# Patient Record
Sex: Male | Born: 1955 | Race: Black or African American | Hispanic: No | Smoking: Former smoker
Health system: Southern US, Community
[De-identification: ages and names within clinical notes are randomized; demographics above are authoritative.]

## PROBLEM LIST (undated history)

## (undated) DIAGNOSIS — K603 Anal fistula, unspecified: Secondary | ICD-10-CM

## (undated) DIAGNOSIS — E785 Hyperlipidemia, unspecified: Secondary | ICD-10-CM

## (undated) DIAGNOSIS — Z973 Presence of spectacles and contact lenses: Secondary | ICD-10-CM

## (undated) DIAGNOSIS — M1990 Unspecified osteoarthritis, unspecified site: Secondary | ICD-10-CM

## (undated) DIAGNOSIS — Z972 Presence of dental prosthetic device (complete) (partial): Secondary | ICD-10-CM

## (undated) DIAGNOSIS — I10 Essential (primary) hypertension: Secondary | ICD-10-CM

## (undated) DIAGNOSIS — M79672 Pain in left foot: Secondary | ICD-10-CM

## (undated) DIAGNOSIS — M79671 Pain in right foot: Secondary | ICD-10-CM

## (undated) DIAGNOSIS — C61 Malignant neoplasm of prostate: Secondary | ICD-10-CM

## (undated) DIAGNOSIS — E119 Type 2 diabetes mellitus without complications: Secondary | ICD-10-CM

## (undated) DIAGNOSIS — K219 Gastro-esophageal reflux disease without esophagitis: Secondary | ICD-10-CM

## (undated) DIAGNOSIS — E1140 Type 2 diabetes mellitus with diabetic neuropathy, unspecified: Secondary | ICD-10-CM

## (undated) DIAGNOSIS — M2550 Pain in unspecified joint: Secondary | ICD-10-CM

## (undated) DIAGNOSIS — T7840XA Allergy, unspecified, initial encounter: Secondary | ICD-10-CM

## (undated) HISTORY — DX: Presence of spectacles and contact lenses: Z97.3

## (undated) HISTORY — DX: Malignant neoplasm of prostate: C61

## (undated) HISTORY — DX: Pain in right foot: M79.671

## (undated) HISTORY — PX: COLONOSCOPY: SHX174

## (undated) HISTORY — DX: Hyperlipidemia, unspecified: E78.5

## (undated) HISTORY — PX: NO PAST SURGERIES: SHX2092

## (undated) HISTORY — DX: Type 2 diabetes mellitus without complications: E11.9

## (undated) HISTORY — DX: Type 2 diabetes mellitus with diabetic neuropathy, unspecified: E11.40

## (undated) HISTORY — DX: Gastro-esophageal reflux disease without esophagitis: K21.9

## (undated) HISTORY — DX: Pain in unspecified joint: M25.50

## (undated) HISTORY — DX: Pain in right foot: M79.672

## (undated) HISTORY — DX: Allergy, unspecified, initial encounter: T78.40XA

---

## 2000-02-22 ENCOUNTER — Encounter: Admission: RE | Admit: 2000-02-22 | Discharge: 2000-05-22 | Payer: Self-pay | Admitting: Family Medicine

## 2002-01-21 ENCOUNTER — Emergency Department (HOSPITAL_COMMUNITY): Admission: EM | Admit: 2002-01-21 | Discharge: 2002-01-21 | Payer: Self-pay | Admitting: Emergency Medicine

## 2002-05-07 ENCOUNTER — Encounter: Admission: RE | Admit: 2002-05-07 | Discharge: 2002-05-07 | Payer: Self-pay | Admitting: Internal Medicine

## 2002-05-14 ENCOUNTER — Encounter: Admission: RE | Admit: 2002-05-14 | Discharge: 2002-05-14 | Payer: Self-pay | Admitting: Internal Medicine

## 2004-03-21 ENCOUNTER — Emergency Department (HOSPITAL_COMMUNITY): Admission: EM | Admit: 2004-03-21 | Discharge: 2004-03-21 | Payer: Self-pay | Admitting: Emergency Medicine

## 2004-08-20 ENCOUNTER — Ambulatory Visit: Payer: Self-pay | Admitting: Internal Medicine

## 2005-09-16 ENCOUNTER — Emergency Department (HOSPITAL_COMMUNITY): Admission: EM | Admit: 2005-09-16 | Discharge: 2005-09-16 | Payer: Self-pay | Admitting: Emergency Medicine

## 2006-04-05 ENCOUNTER — Ambulatory Visit: Payer: Self-pay | Admitting: Family Medicine

## 2006-04-06 ENCOUNTER — Ambulatory Visit: Payer: Self-pay | Admitting: *Deleted

## 2006-04-09 ENCOUNTER — Emergency Department (HOSPITAL_COMMUNITY): Admission: EM | Admit: 2006-04-09 | Discharge: 2006-04-09 | Payer: Self-pay | Admitting: Emergency Medicine

## 2006-05-03 ENCOUNTER — Ambulatory Visit: Payer: Self-pay | Admitting: Family Medicine

## 2006-05-24 ENCOUNTER — Ambulatory Visit: Payer: Self-pay | Admitting: Family Medicine

## 2006-07-28 ENCOUNTER — Ambulatory Visit: Payer: Self-pay | Admitting: Family Medicine

## 2006-08-24 ENCOUNTER — Ambulatory Visit: Payer: Self-pay | Admitting: Family Medicine

## 2006-11-16 ENCOUNTER — Ambulatory Visit: Payer: Self-pay | Admitting: Family Medicine

## 2006-11-22 ENCOUNTER — Ambulatory Visit: Payer: Self-pay | Admitting: Family Medicine

## 2007-03-22 ENCOUNTER — Encounter (INDEPENDENT_AMBULATORY_CARE_PROVIDER_SITE_OTHER): Payer: Self-pay | Admitting: *Deleted

## 2007-05-05 ENCOUNTER — Ambulatory Visit: Payer: Self-pay | Admitting: Family Medicine

## 2007-05-17 ENCOUNTER — Ambulatory Visit (HOSPITAL_BASED_OUTPATIENT_CLINIC_OR_DEPARTMENT_OTHER): Admission: RE | Admit: 2007-05-17 | Discharge: 2007-05-17 | Payer: Self-pay | Admitting: Family Medicine

## 2007-05-21 ENCOUNTER — Ambulatory Visit: Payer: Self-pay | Admitting: Internal Medicine

## 2007-05-25 ENCOUNTER — Emergency Department (HOSPITAL_COMMUNITY): Admission: EM | Admit: 2007-05-25 | Discharge: 2007-05-25 | Payer: Self-pay | Admitting: Emergency Medicine

## 2007-10-16 ENCOUNTER — Ambulatory Visit: Payer: Self-pay | Admitting: Family Medicine

## 2007-10-23 ENCOUNTER — Ambulatory Visit: Payer: Self-pay | Admitting: Internal Medicine

## 2007-11-28 ENCOUNTER — Ambulatory Visit: Payer: Self-pay | Admitting: Family Medicine

## 2008-02-29 ENCOUNTER — Ambulatory Visit: Payer: Self-pay | Admitting: Internal Medicine

## 2008-03-09 ENCOUNTER — Emergency Department (HOSPITAL_BASED_OUTPATIENT_CLINIC_OR_DEPARTMENT_OTHER): Admission: EM | Admit: 2008-03-09 | Discharge: 2008-03-09 | Payer: Self-pay | Admitting: Emergency Medicine

## 2008-03-14 ENCOUNTER — Emergency Department (HOSPITAL_BASED_OUTPATIENT_CLINIC_OR_DEPARTMENT_OTHER): Admission: EM | Admit: 2008-03-14 | Discharge: 2008-03-14 | Payer: Self-pay | Admitting: Emergency Medicine

## 2008-03-19 ENCOUNTER — Emergency Department (HOSPITAL_BASED_OUTPATIENT_CLINIC_OR_DEPARTMENT_OTHER): Admission: EM | Admit: 2008-03-19 | Discharge: 2008-03-19 | Payer: Self-pay | Admitting: Emergency Medicine

## 2008-04-25 ENCOUNTER — Ambulatory Visit: Payer: Self-pay | Admitting: Family Medicine

## 2008-10-03 ENCOUNTER — Ambulatory Visit: Payer: Self-pay | Admitting: Family Medicine

## 2008-10-07 ENCOUNTER — Ambulatory Visit: Payer: Self-pay | Admitting: Family Medicine

## 2008-11-28 ENCOUNTER — Ambulatory Visit: Payer: Self-pay | Admitting: Family Medicine

## 2008-11-30 ENCOUNTER — Emergency Department (HOSPITAL_COMMUNITY): Admission: EM | Admit: 2008-11-30 | Discharge: 2008-11-30 | Payer: Self-pay | Admitting: Emergency Medicine

## 2009-02-04 ENCOUNTER — Ambulatory Visit: Payer: Self-pay | Admitting: Family Medicine

## 2009-02-28 ENCOUNTER — Ambulatory Visit: Payer: Self-pay | Admitting: Family Medicine

## 2009-04-02 ENCOUNTER — Ambulatory Visit: Payer: Self-pay | Admitting: Family Medicine

## 2009-04-28 ENCOUNTER — Ambulatory Visit: Payer: Self-pay | Admitting: Family Medicine

## 2009-06-03 ENCOUNTER — Ambulatory Visit: Payer: Self-pay | Admitting: Family Medicine

## 2009-07-13 ENCOUNTER — Emergency Department (HOSPITAL_COMMUNITY): Admission: EM | Admit: 2009-07-13 | Discharge: 2009-07-13 | Payer: Self-pay | Admitting: Family Medicine

## 2009-07-25 ENCOUNTER — Ambulatory Visit: Payer: Self-pay | Admitting: Internal Medicine

## 2009-08-07 ENCOUNTER — Ambulatory Visit: Payer: Self-pay | Admitting: Family Medicine

## 2009-08-18 ENCOUNTER — Ambulatory Visit: Payer: Self-pay | Admitting: Family Medicine

## 2009-08-18 LAB — CONVERTED CEMR LAB
HDL: 52 mg/dL (ref >39)
LDL Cholesterol: 102 mg/dL — ABNORMAL HIGH (ref 0–99)
Testosterone: 154.76 ng/dL — ABNORMAL LOW (ref 350–890)
Triglycerides: 179 mg/dL — ABNORMAL HIGH (ref <150)

## 2009-08-27 ENCOUNTER — Emergency Department (HOSPITAL_COMMUNITY): Admission: EM | Admit: 2009-08-27 | Discharge: 2009-08-27 | Payer: Self-pay | Admitting: Family Medicine

## 2009-09-11 ENCOUNTER — Ambulatory Visit: Payer: Self-pay | Admitting: Family Medicine

## 2009-09-23 ENCOUNTER — Telehealth (INDEPENDENT_AMBULATORY_CARE_PROVIDER_SITE_OTHER): Payer: Self-pay | Admitting: *Deleted

## 2009-09-24 ENCOUNTER — Ambulatory Visit: Payer: Self-pay | Admitting: Family Medicine

## 2009-09-29 ENCOUNTER — Ambulatory Visit (HOSPITAL_COMMUNITY): Admission: RE | Admit: 2009-09-29 | Discharge: 2009-09-29 | Payer: Self-pay | Admitting: Family Medicine

## 2009-10-22 ENCOUNTER — Ambulatory Visit: Payer: Self-pay | Admitting: Internal Medicine

## 2009-11-06 ENCOUNTER — Ambulatory Visit: Payer: Self-pay | Admitting: Internal Medicine

## 2009-11-19 ENCOUNTER — Ambulatory Visit (HOSPITAL_COMMUNITY): Admission: RE | Admit: 2009-11-19 | Discharge: 2009-11-19 | Payer: Self-pay | Admitting: Family Medicine

## 2009-12-04 ENCOUNTER — Ambulatory Visit: Payer: Self-pay | Admitting: Family Medicine

## 2010-01-22 ENCOUNTER — Ambulatory Visit: Payer: Self-pay | Admitting: Family Medicine

## 2010-05-19 ENCOUNTER — Ambulatory Visit: Payer: Self-pay | Admitting: Internal Medicine

## 2010-08-04 NOTE — Progress Notes (Signed)
Summary: triage/coughing  Phone Note Call from Patient   Caller: Patient Reason for Call: Talk to Nurse Summary of Call: patient has been having on-going issues with coughing and sinus congestion.Marland KitchenMarland KitchenHe was seen at urgent care and treated with amoxicillin and ipratropium bromide on 08/27/09.Marland KitchenMarland Kitchen3/10 was seen here and given another round of amoxicillin by Dr. Audria Nine...he states he is still coughing and still has the congestion. his ears are now ringing. He has been using allergia. Appointment made in Acute Schedule. Initial call taken by: Conchita Paris,  September 23, 2009 12:51 PM

## 2010-08-17 ENCOUNTER — Inpatient Hospital Stay (INDEPENDENT_AMBULATORY_CARE_PROVIDER_SITE_OTHER)
Admission: RE | Admit: 2010-08-17 | Discharge: 2010-08-17 | Disposition: A | Discharge status: Home / Self Care | Payer: Self-pay | Source: Ambulatory Visit | Attending: Family Medicine | Admitting: Family Medicine

## 2010-08-17 DIAGNOSIS — J069 Acute upper respiratory infection, unspecified: Secondary | ICD-10-CM

## 2010-10-13 LAB — RAPID STREP SCREEN (MED CTR MEBANE ONLY): Streptococcus, Group A Screen (Direct): NEGATIVE

## 2010-11-17 NOTE — Procedures (Signed)
NAME:  Matthew Colon, FLATT NO.:  [id number]   MEDICAL RECORD NO.:  [id number]          PATIENT TYPE:  OUT   LOCATION:  SLEEP CENTER                 FACILITY:  [facility]   PHYSICIAN:  Clinton D. Maple Hudson, MD, FCCP, FACPDATE OF BIRTH:  02-24-1957   DATE OF STUDY:  05/17/2007                            NOCTURNAL POLYSOMNOGRAM   REFERRING PHYSICIAN:  Maurice March, M.D.   INDICATION FOR STUDY:  Hypersomnia with sleep apnea.   EPWORTH SLEEPINESS SCORE:  10/24, BMI 38, weight 276 pounds, height 71  inches, neck 16-1/2 inches.   MEDICATIONS:  Home medications listed and reviewed.   SLEEP ARCHITECTURE:  Short total sleep time 97.5 minutes, sleep  efficiency 26%, stage 1 28%, stage 2 52%, stage 3 essentially absent,  REM 17.9% of total sleep time, sleep latency 100 minutes, REM latency  199 minutes, awake after sleep onset 167 minutes, arousal index 32.6.  No bed time medication was taken.   RESPIRATORY DATA:  Apnea-hypopnea index (AHI 4.3 obstructive events per  hour) which is within normal limits (normal range 0-5 per hour).  There  were 2 hypopneas and 5 obstructive apneas.  There were insufficient  events and insufficient sleep to permit CPAP titration by split protocol  on this study night.   OXYGEN DATA:  Snoring with oxygen desaturation to a nadir of 82%, mean  oxygen saturation through the study was 91.7% on room air.   CARDIAC DATA:  Normal sinus rhythm.   MOVEMENT/PARASOMNIA:  No significant movement disturbance.   IMPRESSION/RECOMMENDATIONS:  1. Nonspecific insomnia.  The patient says this is his usual problem      of being restless and not able to sleep at night, but this night      was much worse than typical at home with no particular reasons      stated.  2. Occasional respiratory sleep disturbance, apnea-hypopnea index 4.3      per hour which is within normal (normal      range 0-5 per hour).  No specific therapy suggested.  3. Consider treating as  insomnia including education on good sleep      hygiene.      Clinton D. Maple Hudson, MD, [facility], FACP  Diplomate, Biomedical engineer of Sleep Medicine  Electronically Signed     CDY/MEDQ  D:  05/21/2007 14:00:07  T:  05/22/2007 08:44:58  Job:  [id number]   cc:   Maurice March, M.D.  Fax: [phone]

## 2011-03-03 ENCOUNTER — Ambulatory Visit (HOSPITAL_COMMUNITY)
Admission: RE | Admit: 2011-03-03 | Discharge: 2011-03-03 | Disposition: A | Discharge status: Home / Self Care | Payer: Self-pay | Source: Ambulatory Visit | Attending: Family Medicine | Admitting: Family Medicine

## 2011-03-03 ENCOUNTER — Other Ambulatory Visit: Payer: Self-pay | Admitting: Family Medicine

## 2011-03-03 DIAGNOSIS — M19019 Primary osteoarthritis, unspecified shoulder: Secondary | ICD-10-CM | POA: Insufficient documentation

## 2011-03-03 DIAGNOSIS — M25519 Pain in unspecified shoulder: Secondary | ICD-10-CM | POA: Insufficient documentation

## 2011-03-03 DIAGNOSIS — R52 Pain, unspecified: Secondary | ICD-10-CM

## 2011-04-07 LAB — GLUCOSE, CAPILLARY: Glucose-Capillary: 148 — ABNORMAL HIGH

## 2011-10-15 ENCOUNTER — Emergency Department (HOSPITAL_COMMUNITY)
Admission: EM | Admit: 2011-10-15 | Discharge: 2011-10-15 | Disposition: A | Discharge status: Home / Self Care | Payer: Self-pay | Attending: Emergency Medicine | Admitting: Emergency Medicine

## 2011-10-15 ENCOUNTER — Encounter (HOSPITAL_COMMUNITY): Payer: Self-pay | Admitting: *Deleted

## 2011-10-15 DIAGNOSIS — I10 Essential (primary) hypertension: Secondary | ICD-10-CM | POA: Insufficient documentation

## 2011-10-15 DIAGNOSIS — M25529 Pain in unspecified elbow: Secondary | ICD-10-CM | POA: Insufficient documentation

## 2011-10-15 DIAGNOSIS — M25579 Pain in unspecified ankle and joints of unspecified foot: Secondary | ICD-10-CM | POA: Insufficient documentation

## 2011-10-15 DIAGNOSIS — M25569 Pain in unspecified knee: Secondary | ICD-10-CM | POA: Insufficient documentation

## 2011-10-15 DIAGNOSIS — E119 Type 2 diabetes mellitus without complications: Secondary | ICD-10-CM | POA: Insufficient documentation

## 2011-10-15 DIAGNOSIS — M25519 Pain in unspecified shoulder: Secondary | ICD-10-CM | POA: Insufficient documentation

## 2011-10-15 DIAGNOSIS — M6580 Other synovitis and tenosynovitis, unspecified site: Secondary | ICD-10-CM | POA: Insufficient documentation

## 2011-10-15 DIAGNOSIS — N289 Disorder of kidney and ureter, unspecified: Secondary | ICD-10-CM | POA: Insufficient documentation

## 2011-10-15 HISTORY — DX: Essential (primary) hypertension: I10

## 2011-10-15 LAB — BASIC METABOLIC PANEL
BUN: 32 mg/dL — ABNORMAL HIGH (ref 6–23)
Creatinine, Ser: 2.76 mg/dL — ABNORMAL HIGH (ref 0.50–1.35)
GFR calc Af Amer: 28 mL/min — ABNORMAL LOW (ref >90)
GFR calc non Af Amer: 24 mL/min — ABNORMAL LOW (ref >90)
Potassium: 4.6 mEq/L (ref 3.5–5.1)

## 2011-10-15 LAB — DIFFERENTIAL
Basophils Absolute: 0 10*3/uL (ref 0.0–0.1)
Eosinophils Absolute: 0.2 10*3/uL (ref 0.0–0.7)
Monocytes Absolute: 0.6 10*3/uL (ref 0.1–1.0)
Monocytes Relative: 4 % (ref 3–12)
Neutro Abs: 12.1 10*3/uL — ABNORMAL HIGH (ref 1.7–7.7)

## 2011-10-15 LAB — GLUCOSE, CAPILLARY: Glucose-Capillary: 146 mg/dL — ABNORMAL HIGH (ref 70–99)

## 2011-10-15 LAB — CBC
Hemoglobin: 12.7 g/dL — ABNORMAL LOW (ref 13.0–17.0)
MCH: 27.9 pg (ref 26.0–34.0)
RDW: 14.3 % (ref 11.5–15.5)
WBC: 16.6 10*3/uL — ABNORMAL HIGH (ref 4.0–10.5)

## 2011-10-15 MED ORDER — HYDROMORPHONE HCL PF 1 MG/ML IJ SOLN
1.0000 mg | Freq: Once | INTRAMUSCULAR | Status: AC
  Administered 2011-10-15: 1 mg via INTRAMUSCULAR

## 2011-10-15 MED ORDER — HYDROMORPHONE HCL PF 1 MG/ML IJ SOLN
INTRAMUSCULAR | Status: AC
  Filled 2011-10-15: qty 1

## 2011-10-15 MED ORDER — HYDROCODONE-ACETAMINOPHEN 5-325 MG PO TABS: ORAL_TABLET | ORAL | Status: DC

## 2011-10-15 MED ORDER — OXYCODONE-ACETAMINOPHEN 5-325 MG PO TABS
2.0000 | ORAL_TABLET | Freq: Once | ORAL | Status: AC
  Administered 2011-10-15: 2 via ORAL
  Filled 2011-10-15: qty 2

## 2011-10-15 NOTE — Discharge Instructions (Signed)
Follow up with your md as planned °

## 2011-10-15 NOTE — ED Provider Notes (Signed)
History     CSN: [id number]  Arrival date & time 10/15/11  1116   First MD Initiated Contact with Patient 10/15/11 1225      Chief Complaint  Patient presents with  . Shoulder Pain  . Knee Pain  . Ankle Pain    (Consider location/radiation/quality/duration/timing/severity/associated sxs/prior treatment) Patient is a 56 y.o. male presenting with arm injury. The history is provided by the patient (Patient complains of pain in his left elbow with pain in his left ankle and right ankle. He states that he hurt his left elbow while working.). No language interpreter was used.  Arm Injury  The incident occurred more than 2 days ago. The incident occurred at work. Injury mechanism: over use. The injury was related to work. The wounds were not self-inflicted. No protective equipment was used. There is an injury to the left elbow. The pain is moderate. It is unlikely that a foreign body is present. Pertinent negatives include no chest pain, no abdominal pain, no bladder incontinence, no headaches, no hearing loss, no seizures and no cough.    Past Medical History  Diagnosis Date  . Diabetes mellitus   . Hypertension     History reviewed. No pertinent past surgical history.  History reviewed. No pertinent family history.  History  Substance Use Topics  . Smoking status: Never Smoker   . Smokeless tobacco: Not on file  . Alcohol Use: No      Review of Systems  Constitutional: Negative for fatigue.  HENT: Negative for hearing loss, congestion, sinus pressure and ear discharge.   Eyes: Negative for discharge.  Respiratory: Negative for cough.   Cardiovascular: Negative for chest pain.  Gastrointestinal: Negative for abdominal pain and diarrhea.  Genitourinary: Negative for bladder incontinence, frequency and hematuria.  Musculoskeletal: Negative for back pain.       Pain in left elbow and left and right ankles  Skin: Negative for rash.  Neurological: Negative for seizures and  headaches.  Hematological: Negative.   Psychiatric/Behavioral: Negative for hallucinations.    Allergies  Review of patient's allergies indicates no known allergies.  Home Medications   Current Outpatient Rx  Name Route Sig Dispense Refill  . ACETAMINOPHEN 500 MG PO TABS Oral Take 1,000 mg by mouth every 6 (six) hours as needed. For pain    . ASPIRIN EC 81 MG PO TBEC Oral Take 81 mg by mouth daily.    Marland Kitchen GLIMEPIRIDE 4 MG PO TABS Oral Take 4 mg by mouth 2 (two) times daily.    . INSULIN DETEMIR 100 UNIT/ML [place] SOLN Subcutaneous Inject 30 Units into the skin at bedtime.    Marland Kitchen LISINOPRIL-HYDROCHLOROTHIAZIDE 20-25 MG PO TABS Oral Take 1 tablet by mouth daily.    Marland Kitchen METFORMIN HCL 1000 MG PO TABS Oral Take 1,000 mg by mouth 2 (two) times daily with a meal.    . PANTOPRAZOLE SODIUM 40 MG PO TBEC Oral Take 40 mg by mouth daily.    Marland Kitchen HYDROCODONE-ACETAMINOPHEN 5-325 MG PO TABS  1 every 6-8 hours for pain 40 tablet 0    BP 118/66  Pulse 116  Temp(Src) 98.3 F (36.8 C) (Oral)  Resp 16  SpO2 99%  Physical Exam  Constitutional: He is oriented to person, place, and time. He appears well-developed.  HENT:  Head: Normocephalic and atraumatic.  Eyes: Conjunctivae and EOM are normal. No scleral icterus.  Neck: Neck supple. No thyromegaly present.  Cardiovascular: Normal rate and regular rhythm.  Exam reveals no gallop and no  friction rub.   No murmur heard. Pulmonary/Chest: No stridor. He has no wheezes. He has no rales. He exhibits no tenderness.  Abdominal: He exhibits no distension. There is no tenderness. There is no rebound.  Musculoskeletal: He exhibits no edema.       Tender left elbow with mild pain both ankles  Lymphadenopathy:    He has no cervical adenopathy.  Neurological: He is oriented to person, place, and time. Coordination normal.  Skin: No rash noted. No erythema.  Psychiatric: He has a normal mood and affect. His behavior is normal.    ED Course  Procedures (including  critical care time)  Labs Reviewed  GLUCOSE, CAPILLARY - Abnormal; Notable for the following:    Glucose-Capillary 146 (*)    All other components within normal limits  CBC - Abnormal; Notable for the following:    WBC 16.6 (*)    Hemoglobin 12.7 (*)    HCT 37.2 (*)    Platelets 427 (*)    All other components within normal limits  DIFFERENTIAL - Abnormal; Notable for the following:    Neutro Abs 12.1 (*)    All other components within normal limits  BASIC METABOLIC PANEL - Abnormal; Notable for the following:    Sodium 134 (*)    Glucose, Bld 137 (*)    BUN 32 (*)    Creatinine, Ser 2.76 (*)    GFR calc non Af Amer 24 (*)    GFR calc Af Amer 28 (*)    All other components within normal limits   No results found.   1. Renal insufficiency   2. Elbow pain       MDM  Tendonitis left elbow.  New onset renal insuff.  He is to follow up in 1-2 weeks with his md        Benny Lennert, MD 10/15/11 1435

## 2011-10-15 NOTE — ED Notes (Addendum)
Pt states he woke up this am with left shoulder, left knee, left ankle, and the bottom of his feet hurting, denies any injury, states woke up with the discomfort. Denies any new numbness or tingling to left hand. Pt reports limited ROM to left shoulder.

## 2011-11-10 ENCOUNTER — Encounter: Payer: Self-pay | Admitting: Physical Medicine and Rehabilitation

## 2011-11-17 ENCOUNTER — Ambulatory Visit: Payer: Self-pay | Attending: Family Medicine | Admitting: Physical Therapy

## 2011-11-17 DIAGNOSIS — IMO0001 Reserved for inherently not codable concepts without codable children: Secondary | ICD-10-CM | POA: Insufficient documentation

## 2011-11-17 DIAGNOSIS — R5381 Other malaise: Secondary | ICD-10-CM | POA: Insufficient documentation

## 2011-11-17 DIAGNOSIS — M2560 Stiffness of unspecified joint, not elsewhere classified: Secondary | ICD-10-CM | POA: Insufficient documentation

## 2011-11-17 DIAGNOSIS — M2550 Pain in unspecified joint: Secondary | ICD-10-CM | POA: Insufficient documentation

## 2011-11-23 ENCOUNTER — Ambulatory Visit: Payer: Self-pay | Admitting: Physical Therapy

## 2011-11-26 ENCOUNTER — Encounter: Payer: Self-pay | Attending: Physical Medicine and Rehabilitation | Admitting: Physical Medicine and Rehabilitation

## 2011-11-26 ENCOUNTER — Encounter: Payer: Self-pay | Admitting: Physical Medicine and Rehabilitation

## 2011-11-26 VITALS — BP 122/80 | HR 90 | Resp 18 | Ht 71.0 in | Wt 289.0 lb

## 2011-11-26 DIAGNOSIS — M25519 Pain in unspecified shoulder: Secondary | ICD-10-CM | POA: Insufficient documentation

## 2011-11-26 DIAGNOSIS — G8929 Other chronic pain: Secondary | ICD-10-CM | POA: Insufficient documentation

## 2011-11-26 DIAGNOSIS — R279 Unspecified lack of coordination: Secondary | ICD-10-CM | POA: Insufficient documentation

## 2011-11-26 DIAGNOSIS — R2689 Other abnormalities of gait and mobility: Secondary | ICD-10-CM

## 2011-11-26 DIAGNOSIS — M1710 Unilateral primary osteoarthritis, unspecified knee: Secondary | ICD-10-CM | POA: Insufficient documentation

## 2011-11-26 DIAGNOSIS — R29898 Other symptoms and signs involving the musculoskeletal system: Secondary | ICD-10-CM

## 2011-11-26 DIAGNOSIS — R269 Unspecified abnormalities of gait and mobility: Secondary | ICD-10-CM | POA: Insufficient documentation

## 2011-11-26 DIAGNOSIS — Z9181 History of falling: Secondary | ICD-10-CM | POA: Insufficient documentation

## 2011-11-26 DIAGNOSIS — G8190 Hemiplegia, unspecified affecting unspecified side: Secondary | ICD-10-CM | POA: Insufficient documentation

## 2011-11-26 DIAGNOSIS — M549 Dorsalgia, unspecified: Secondary | ICD-10-CM

## 2011-11-26 DIAGNOSIS — M542 Cervicalgia: Secondary | ICD-10-CM | POA: Insufficient documentation

## 2011-11-26 NOTE — Patient Instructions (Signed)
Please followup with your primary care  Or neurologist  I have ordered a neck MRI.

## 2011-11-26 NOTE — Progress Notes (Addendum)
Subjective:    Patient ID: Matthew Colon, male    DOB: Jul 12, 1955, 56 y.o.   MRN: [id number]  HPI  The patient is a 56 year old divorced African American gentleman who presents to our clinic today with pain at "all over". He is a right handed gentleman His most problematic area is his left shoulder, followed by a left upper extremity. He also notes pain in  left lower extremity.  He does not have pain in his trunk or abdomen..,  This pain began at the same time in the left upper extremity and left lower extremity. This began about 1 year ago. He reports pain in the left upper and left lower extremity gradually got worse over the last year and in fact has become even worse in the last 6 weeks.  He does not report a history of a motor vehicle accident, trauma, falls or anything else that he can think of that brought in on.  Pain in the left upper extremity is described as pinst and needles, sharp pains, aching. The peritoneal sensation is mainly in the left shoulder but he has sharp pains in aching in the left arm and left leg.  He feels some snapping and popping in the left shoulder this is gone on a year as well.  Prior to the onset of his pain problems he worked as a Chief of Staff  He also notes some pain complaints on the right side of his body at his shoulder elbow hands knees and feet however he states that his main issues are left-sided.  He also reports low back pain which is worse when he moves and bends.   He reports no problems controlling bowel or bladder. Occasional numbness noted in both hands when he wakes up and in morning for about one year. This improves after he's been up.  He reports weakness in his "whole body". He reports he feels weak all over.  He reports multiple falls over the last year.       Pain Inventory Average Pain 10 Pain Right Now 10 My pain is sharp and aching  In the last 24 hours, has pain interfered with the  following? General activity 10 Relation with others 10 Enjoyment of life 10 What TIME of day is your pain at its worst? All Day Sleep (in general) Poor  Pain is worse with: walking, bending, inactivity, standing and some activites Pain improves with: rest, therapy/exercise and pacing activities Relief from Meds: 2  Mobility walk without assistance use a cane ability to climb steps?  no do you drive?  no transfers alone  Function not employed: date last employed about one year  Neuro/Psych weakness numbness tingling trouble walking spasms depression  Prior Studies Has never had any imaging studies done  Physicians involved in your care Primary care  Neurologist    Family History  Problem Relation Age of Onset  . Diabetes Mother   . Hypertension Mother   . Diabetes Sister    History   Social History  . Marital Status: Divorced    Spouse Name: N/A    Number of Children: N/A  . Years of Education: N/A   Social History Main Topics  . Smoking status: Current Some Day Smoker  . Smokeless tobacco: None  . Alcohol Use: No  . Drug Use: None  . Sexually Active: None   Other Topics Concern  . None   Social History Narrative  . None   History reviewed. No pertinent  past surgical history. Past Medical History  Diagnosis Date  . Diabetes mellitus   . Hypertension    BP 122/80  Pulse 90  Resp 18  Ht 5\' 11"  (1.803 m)  Wt 289 lb (131.09 kg)  BMI 40.31 kg/m2  SpO2 98%      Review of Systems  Constitutional: Positive for fever and chills.  HENT: Negative.   Eyes: Negative.   Respiratory: Negative.   Cardiovascular: Negative.   Gastrointestinal: Positive for nausea and constipation.  Genitourinary: Negative.   Musculoskeletal: Positive for back pain and joint swelling.  Skin: Negative.   Neurological: Positive for weakness and numbness.  Hematological: Negative.   Psychiatric/Behavioral: Negative.        Objective:   Physical Exam BP  122/80  Pulse 90  Resp 18  Ht 5\' 11"  (1.803 m)  Wt 289 lb (131.09 kg)  BMI 40.31 kg/m2  SpO2 98%   well-developed obese African American male in no apparent distress  Cranial nerves are grossly intact, face is symmetric, no obvious visual field deficits  Coordination is decreased in left upper and left lower extremity, fine motor coordination is diminished in left hand. Slow finger-nose-finger in left hand compared to right  Increased tone noted in left upper and left lower extremity, mild tremor present, tremor also noted in right upper extremity intermittently  Towards decreased sensation to light touch and pinprick in left upper and left lower extremity.  Well preserve muscle bulk. Strength difficult to assess generally 5 over 5 in right upper extremity and right lower extremity, patient is unable to give full effort with left upper extremity due to shoulder pain and upper extremity pain on the left.  Does not give full effort and left lower extremity at least 4+ to 4/5 at hip flexors knee extensors dorsiflexors  Reflexes are 1+ in the upper extremities at biceps triceps brachioradialis one plus in the lower extremities and diminished at the ankles  Straight leg raise negative bilateral   Slowly transitions from sitting to stand  Gait is on even with decreased weight-bearing and left lower extremity  Cannot walk on heels and toes, difficulty with tandem gait, some sway with Romberg test noted  Mild limitations in cervical range of motion  Left shoulder motion very limited 40 of abduction very little internal or external rotation complains of pain with movement  Limited lumbar motion complains of pain in all planes  03/03/11 RADIOLOGY REPORT*  Clinical Data: Neck pain radiating to both shoulders.  CERVICAL SPINE - COMPLETE 4+ VIEW  Comparison: None.  Findings: No evidence of acute fracture, subluxation, or  prevertebral soft tissue swelling.  Mild degenerative disc  disease is seen at levels of C3-4, C5-6, and  C6-7. No other significant bone abnormality identified.  IMPRESSION:  1. No acute findings.  2. Mild degenerative disc disease at C3-4, C5-6, and C6-7.  Original Report Authenticated By: Danae Orleans, M.D.  Clinical Data: Left shoulder pain  LEFT SHOULDER - 2+ VIEW  Comparison: None.  Findings: No evidence of fracture or dislocation.  Moderate osteoarthritis is seen involve the glenohumeral joint.  Mild degenerative spurring is also seen involving the  acromioclavicular joint. No other bone lesions identified. The  soft tissues are unremarkable.  IMPRESSION:  1. No acute findings.  2. Moderate glenohumeral osteoarthritis.  3. Mild acromioclavicular degenerative changes.  Original Report Authenticated By: Danae Orleans, M.D.       Assessment & Plan:  1. Mild left hemiplegia patient should follow back  up with primary care or neurology this was discussed with him today he states he has an appointment with primary care next week   2 balance disorder/gait disorder  3 left shoulder pain may be multifactorial in nature, recent xray show moderate glenohumeral osteo arthritis, post stroke shoulder, has increased tone.   4.chronic pain  5. Cervicalgia that is post-multiple falls   Given his history of falls and significant left shoulder pain and neck pain we'll obtain cervical MRI.  Consider referral to neurology  UDS  May consider starting  Gabapentin  Tramadol may be a good option as well   may consider shoulder injection.  UDS 11/26/11 constant,  pos for tramadol

## 2011-12-01 ENCOUNTER — Encounter: Payer: Self-pay | Admitting: Physical Therapy

## 2011-12-03 ENCOUNTER — Encounter: Payer: Self-pay | Admitting: Physical Therapy

## 2011-12-06 ENCOUNTER — Ambulatory Visit (HOSPITAL_COMMUNITY): Admission: RE | Admit: 2011-12-06 | Payer: Self-pay | Source: Ambulatory Visit

## 2011-12-08 ENCOUNTER — Ambulatory Visit: Payer: Self-pay | Attending: Family Medicine | Admitting: Physical Therapy

## 2011-12-08 DIAGNOSIS — M2550 Pain in unspecified joint: Secondary | ICD-10-CM | POA: Insufficient documentation

## 2011-12-08 DIAGNOSIS — R5381 Other malaise: Secondary | ICD-10-CM | POA: Insufficient documentation

## 2011-12-08 DIAGNOSIS — IMO0001 Reserved for inherently not codable concepts without codable children: Secondary | ICD-10-CM | POA: Insufficient documentation

## 2011-12-08 DIAGNOSIS — M2560 Stiffness of unspecified joint, not elsewhere classified: Secondary | ICD-10-CM | POA: Insufficient documentation

## 2011-12-10 ENCOUNTER — Encounter: Payer: Self-pay | Admitting: Physical Therapy

## 2011-12-10 ENCOUNTER — Telehealth: Payer: Self-pay | Admitting: *Deleted

## 2011-12-10 DIAGNOSIS — M542 Cervicalgia: Secondary | ICD-10-CM

## 2011-12-10 NOTE — Telephone Encounter (Signed)
Needs an order for Cr to be drawn before pt MRI. Please order in the system.  Order has been placed.

## 2011-12-13 ENCOUNTER — Inpatient Hospital Stay (HOSPITAL_COMMUNITY): Admission: RE | Admit: 2011-12-13 | Payer: Self-pay | Source: Ambulatory Visit

## 2011-12-13 ENCOUNTER — Emergency Department (HOSPITAL_COMMUNITY)
Admission: EM | Admit: 2011-12-13 | Discharge: 2011-12-13 | Disposition: A | Discharge status: Home / Self Care | Payer: Self-pay | Attending: Emergency Medicine | Admitting: Emergency Medicine

## 2011-12-13 DIAGNOSIS — M79609 Pain in unspecified limb: Secondary | ICD-10-CM | POA: Insufficient documentation

## 2011-12-13 DIAGNOSIS — M25519 Pain in unspecified shoulder: Secondary | ICD-10-CM

## 2011-12-13 DIAGNOSIS — K3184 Gastroparesis: Secondary | ICD-10-CM | POA: Insufficient documentation

## 2011-12-13 DIAGNOSIS — Z794 Long term (current) use of insulin: Secondary | ICD-10-CM | POA: Insufficient documentation

## 2011-12-13 DIAGNOSIS — E1149 Type 2 diabetes mellitus with other diabetic neurological complication: Secondary | ICD-10-CM | POA: Insufficient documentation

## 2011-12-13 DIAGNOSIS — R42 Dizziness and giddiness: Secondary | ICD-10-CM | POA: Insufficient documentation

## 2011-12-13 DIAGNOSIS — I10 Essential (primary) hypertension: Secondary | ICD-10-CM | POA: Insufficient documentation

## 2011-12-13 DIAGNOSIS — R110 Nausea: Secondary | ICD-10-CM | POA: Insufficient documentation

## 2011-12-13 DIAGNOSIS — E1143 Type 2 diabetes mellitus with diabetic autonomic (poly)neuropathy: Secondary | ICD-10-CM

## 2011-12-13 DIAGNOSIS — F17200 Nicotine dependence, unspecified, uncomplicated: Secondary | ICD-10-CM | POA: Insufficient documentation

## 2011-12-13 LAB — DIFFERENTIAL
Basophils Absolute: 0 10*3/uL (ref 0.0–0.1)
Lymphocytes Relative: 21 % (ref 12–46)
Monocytes Absolute: 0.8 10*3/uL (ref 0.1–1.0)
Neutro Abs: 9.1 10*3/uL — ABNORMAL HIGH (ref 1.7–7.7)
Neutrophils Relative %: 70 % (ref 43–77)

## 2011-12-13 LAB — CBC
HCT: 33.1 % — ABNORMAL LOW (ref 39.0–52.0)
Hemoglobin: 11.2 g/dL — ABNORMAL LOW (ref 13.0–17.0)
RDW: 16.5 % — ABNORMAL HIGH (ref 11.5–15.5)
WBC: 12.9 10*3/uL — ABNORMAL HIGH (ref 4.0–10.5)

## 2011-12-13 LAB — URINALYSIS, ROUTINE W REFLEX MICROSCOPIC
Glucose, UA: NEGATIVE mg/dL
Hgb urine dipstick: NEGATIVE
Ketones, ur: 15 mg/dL — AB
Protein, ur: NEGATIVE mg/dL
Urobilinogen, UA: 0.2 mg/dL (ref 0.0–1.0)

## 2011-12-13 LAB — COMPREHENSIVE METABOLIC PANEL
ALT: 9 U/L (ref 0–53)
AST: 17 U/L (ref 0–37)
Albumin: 3 g/dL — ABNORMAL LOW (ref 3.5–5.2)
Alkaline Phosphatase: 60 U/L (ref 39–117)
CO2: 26 mEq/L (ref 19–32)
Chloride: 107 mEq/L (ref 96–112)
Creatinine, Ser: 1.18 mg/dL (ref 0.50–1.35)
GFR calc non Af Amer: 68 mL/min — ABNORMAL LOW (ref >90)
Potassium: 3.5 mEq/L (ref 3.5–5.1)
Total Bilirubin: 0.2 mg/dL — ABNORMAL LOW (ref 0.3–1.2)

## 2011-12-13 MED ORDER — MORPHINE SULFATE 4 MG/ML IJ SOLN
4.0000 mg | Freq: Once | INTRAMUSCULAR | Status: AC
  Administered 2011-12-13: 4 mg via INTRAVENOUS
  Filled 2011-12-13: qty 1

## 2011-12-13 MED ORDER — ONDANSETRON HCL 4 MG/2ML IJ SOLN
4.0000 mg | Freq: Once | INTRAMUSCULAR | Status: AC
  Administered 2011-12-13: 4 mg via INTRAVENOUS
  Filled 2011-12-13: qty 2

## 2011-12-13 MED ORDER — HYDROCODONE-ACETAMINOPHEN 5-325 MG PO TABS: 1.0000 | ORAL_TABLET | ORAL | Status: AC | PRN

## 2011-12-13 MED ORDER — HYDROMORPHONE HCL PF 1 MG/ML IJ SOLN
1.0000 mg | Freq: Once | INTRAMUSCULAR | Status: AC
  Administered 2011-12-13: 1 mg via INTRAVENOUS
  Filled 2011-12-13: qty 1

## 2011-12-13 MED ORDER — METOCLOPRAMIDE HCL 10 MG PO TABS: 10.0000 mg | ORAL_TABLET | Freq: Four times a day (QID) | ORAL | Status: DC

## 2011-12-13 NOTE — ED Provider Notes (Signed)
History     CSN: [id number]  Arrival date & time 12/13/11  0900   First MD Initiated Contact with Patient 12/13/11 0905      No chief complaint on file.   (Consider location/radiation/quality/duration/timing/severity/associated sxs/prior treatment) HPI Comments: Patient presents do to worsening nausea over the last 2 days. He has not had any vomiting and he denies abdominal pain. Patient has a history of diabetes however his blood sugars have been controlled over the last several weeks. His blood sugar this morning was 101. Patient states at work he was feeling worsening nausea and lightheaded and decided to come in to be checked.  The nausea is made worse with eating any kind of food and proton ex does not seem to help with the nausea. Since he's been on the Protonix his reflux symptoms have resolved. Also over the last 3 months he has stopped using alcohol and drugs. He does smoke occasionally. He is also complaining of left arm pain. The pain is an 8/10 and is worse with movement. He's been in physical therapy for the shoulder pain but he states is not improving. The pain runs all the way down his arm and causes some tingling in his fingers.   The history is provided by the patient.    Past Medical History  Diagnosis Date  . Diabetes mellitus   . Hypertension     No past surgical history on file.  Family History  Problem Relation Age of Onset  . Diabetes Mother   . Hypertension Mother   . Diabetes Sister     History  Substance Use Topics  . Smoking status: Current Some Day Smoker  . Smokeless tobacco: Not on file  . Alcohol Use: No      Review of Systems  Constitutional: Positive for unexpected weight change. Negative for fever.       35 pound weight loss  Respiratory: Negative for cough, shortness of breath and wheezing.   Cardiovascular: Negative for chest pain and leg swelling.  Gastrointestinal: Positive for nausea. Negative for vomiting, abdominal pain and  constipation.  Genitourinary: Negative for dysuria.  Neurological: Positive for light-headedness.  All other systems reviewed and are negative.    Allergies  Review of patient's allergies indicates no known allergies.  Home Medications   Current Outpatient Rx  Name Route Sig Dispense Refill  . ACETAMINOPHEN 500 MG PO TABS Oral Take 1,000 mg by mouth every 6 (six) hours as needed. For pain    . ASPIRIN EC 81 MG PO TBEC Oral Take 81 mg by mouth daily.    Marland Kitchen HYDROCODONE-ACETAMINOPHEN 5-325 MG PO TABS Oral Take 1 tablet by mouth every 6 (six) hours as needed. For pain    . HYDROXYZINE HCL 25 MG PO TABS Oral Take 25 mg by mouth at bedtime.    . INSULIN GLARGINE 100 UNIT/ML [place] SOLN Subcutaneous Inject 40-45 Units into the skin at bedtime.    Marland Kitchen PANTOPRAZOLE SODIUM 40 MG PO TBEC Oral Take 40 mg by mouth daily.    Marland Kitchen PRAVASTATIN SODIUM 20 MG PO TABS Oral Take 20 mg by mouth daily.    . TRAMADOL HCL 50 MG PO TABS Oral Take 50 mg by mouth every 6 (six) hours as needed. For pain      There were no vitals taken for this visit.  Physical Exam  Nursing note and vitals reviewed. Constitutional: He is oriented to person, place, and time. He appears well-developed and well-nourished. No distress.  HENT:  Head: Normocephalic and atraumatic.  Mouth/Throat: Oropharynx is clear and moist.  Eyes: Conjunctivae and EOM are normal. Pupils are equal, round, and reactive to light.  Neck: Normal range of motion. Neck supple.  Cardiovascular: Normal rate, regular rhythm and intact distal pulses.   No murmur heard. Pulmonary/Chest: Effort normal and breath sounds normal. No respiratory distress. He has no wheezes. He has no rales.  Abdominal: Soft. He exhibits no distension. There is no tenderness. There is no rebound and no guarding.  Musculoskeletal: He exhibits tenderness. He exhibits no edema.       Left shoulder: He exhibits decreased range of motion, tenderness, bony tenderness and pain. He exhibits no  swelling, no effusion, no deformity, normal pulse and normal strength.  Neurological: He is alert and oriented to person, place, and time.  Skin: Skin is warm and dry. No rash noted. No erythema.  Psychiatric: He has a normal mood and affect. His behavior is normal.    ED Course  Procedures (including critical care time)  Labs Reviewed  CBC - Abnormal; Notable for the following:    WBC 12.9 (*)    RBC 4.10 (*)    Hemoglobin 11.2 (*)    HCT 33.1 (*)    RDW 16.5 (*)    All other components within normal limits  DIFFERENTIAL - Abnormal; Notable for the following:    Neutro Abs 9.1 (*)    All other components within normal limits  COMPREHENSIVE METABOLIC PANEL - Abnormal; Notable for the following:    Total Protein 5.9 (*)    Albumin 3.0 (*)    Total Bilirubin 0.2 (*)    GFR calc non Af Amer 68 (*)    GFR calc Af Amer 79 (*)    All other components within normal limits  URINALYSIS, ROUTINE W REFLEX MICROSCOPIC - Abnormal; Notable for the following:    Color, Urine AMBER (*) BIOCHEMICALS MAY BE AFFECTED BY COLOR   Specific Gravity, Urine 1.035 (*)    Bilirubin Urine SMALL (*)    Ketones, ur 15 (*)    All other components within normal limits  POCT I-STAT TROPONIN I   No results found.   Date: 12/13/2011  Rate: 93  Rhythm: normal sinus rhythm  QRS Axis: normal  Intervals: normal  ST/T Wave abnormalities: normal  Conduction Disutrbances:none  Narrative Interpretation:   Old EKG Reviewed: none available    No diagnosis found.    MDM   Patient presenting today do to worsening nausea and left shoulder pain. Patient states his nausea worsens with eating and he feels like vomiting but has never vomited. He takes Protonix but states he still gets acid reflux. He does have multiple risk factors for cardiac disease however his symptoms don't seem to be cardiac related. They are not worse with activity he does not have shortness of breath, diaphoresis or chest pain. Patient  does have left arm pain however this is a chronic condition it's worse when the arm is ranged he's been going to physical therapy for this. He states the arm pain is the same he no longer has any pain medication. Patient also has history of renal insufficiency and is seeing a specialist at [facility]. The last time he was here in April his creatinine was 2.67 which was newly diagnosed. Also states at that time he states he was taking a lot of ibuprofen and BC powder for his shoulder pain which he is since stopped. He is also stopped using alcohol and  drugs for the last 3 months. The patient has lost 35 pounds and was taken off of his antihypertensive medication and continues Lantus. His sugars have been in the 100s for the last several weeks. He has no abdominal pain and the nausea appears to be related to possibly more stomach emptying as well as he has early satiety. He has no abdominal pain and otherwise is well-appearing on exam. Patient appears hydrated. Patient with a TIMI score of 2 for daily aspirin use and risk factors. His risk factors are diabetes, hyperlipidemia, tobacco use however patient has been off of antihypertensives for some time now and he has normal blood pressure.  Concern for possible uremia as the cause of his symptoms versus gastroparesis versus hypoglycemia versus infection lower concern for cardiac Will screen with an EKG, CBC, CMP, UA and i-STAT troponin.  Patient given pain and nausea control.  12:51 PM Today creatinine is normal at 1.1. All labs are within normal limits. EKG is unrevealing. Patient's nausea is improved after Zofran and feel this is most likely caused by his diabetes. Will start patient on Reglan and he will followup with his doctor. He also has been seeing physical therapy for his shoulder and will also followup for possible cortisone injections.  Patient understands plan and states that he feels much better and is ready to go home.      Gwyneth Sprout,  MD 12/13/11 1252

## 2011-12-13 NOTE — ED Notes (Addendum)
Patient states he is having left shoulder pain and nauseated 2-3 weeks and states he feels weak. Patient denies chest pain and denies SOB. Patient states he has been dieting and effectively loosing weight. Patient placed on monitor and EKG captured and oxygen sats of 98%.

## 2011-12-13 NOTE — Discharge Instructions (Signed)
Adhesive Capsulitis Sometimes the shoulder becomes stiff and is painful to move. Some people say it feels as if the shoulder is frozen in place. Because of this, the condition is called "frozen shoulder." Its medical name is adhesive capsulitis.  The shoulder joint is made up of strong connective tissue that attaches the ball of the humerus to the shallow shoulder socket. This strong connective tissue is called the joint capsule. This tissue can become stiff and swollen. That is when adhesive capsulitis sets in. CAUSES  It is not always clear just what the cause adhesive capsulitis. Possibilities include:  Injury to the shoulder joint.   Strain. This is a repetitive injury brought about by overuse.   Lack of use. Perhaps your arm or hand was otherwise injured. It might have been in a sling for awhile. Or perhaps you were not using it to avoid pain.   Referred pain. This is a sort of trick the body plays. You feel pain in the shoulder. But, the pain actually comes from an injury somewhere else in the body.   Long-standing health problems. Several diseases can cause adhesive capsulitis. They include diabetes, heart disease, stroke, thyroid problems, rheumatoid arthritis and lung disease.   Being a women older than 40. Anyone can develop adhesive capsulitis but it is most common in women in this age group.  SYMPTOMS   Pain.   It occurs when the arm is moved.   Parts of the shoulder might hurt if they are touched.   Pain is worse at night or when resting.   Soreness. It might not be strong enough to be called pain. But, the shoulder aches.   The shoulder does not move freely.   Muscle spasms.   Trouble sleeping because of shoulder ache or pain.  DIAGNOSIS  To decide if you have adhesive capsulitis, your healthcare provider will probably:  Ask about symptoms you have noticed.   Ask about your history of joint pain and anything that might have caused the pain.   Ask about your  overall health.   Use hands to feel your shoulder and neck.   Ask you to move your shoulder in specific directions. This may indicate the origin of the pain.   Order imaging tests; pictures of the shoulder. They help pinpoint the source of the problem. An X-ray might be used. For more detail, an MRI is often used. An MRI details the tendons, muscles and ligaments as well as the joint.  TREATMENT  Adhesive capsulitis can be treated several ways. Most treatments can be done in a clinic or in your healthcare provider's office. Be sure to discuss the different options with your caregiver. They include:  Physical therapy. You will work on specific exercises to get your shoulder moving again. The exercises usually involve stretching. A physical therapist (a caregiver with special training) can show you what to do and what not to do. The exercises will need to be done daily.   Medication.   Over-the-counter medicines may relieve pain and inflammation (the body's way of reacting to injury or infection).   Corticosteroids. These are stronger drugs to reduce pain and inflammation. They are given by injection (shots) into the shoulder joint. Frequent treatment is not recommended.   Muscle relaxants. Medication may be prescribed to ease muscle spasms.   Treatment of underlying conditions. This means treating another condition that is causing your shoulder problem. This might be a rotator cuff (tendon) problem   Shoulder manipulation. The shoulder will   be moved by your healthcare provider. You would be under general anesthesia (given a drug that puts you to sleep). You would not feel anything. Sometimes the joint will be injected with salt water (saline) at high pressure to break down internal scarring in the joint capsule.   Surgery. This is rarely needed. It may be suggested in advanced cases after all other treatment has failed.  PROGNOSIS  In time, most people recover from adhesive capsulitis.  Sometimes, however, the pain goes away but full movement of the shoulder does not return.  HOME CARE INSTRUCTIONS   Take any pain medications recommended by your healthcare provider. Follow the directions carefully.   If you have physical therapy, follow through with the therapist's suggestions. Be sure you understand the exercises you will be doing. You should understand:   How often the exercises should be done.   How many times each exercise should be repeated.   How long they should be done.   What other activities you should do, or not do.   That you should warm up before doing any exercise. Just 5 to 10 minutes will help. Small, gentle movements should get your shoulder ready for more.   Avoid high-demand exercise that involves your shoulder such as throwing. This type of exercise can make pain worse.   Consider using cold packs. Cold may ease swelling and pain. Ask your healthcare provider if a cold pack might help you. If so, get directions on how and when to use them.  SEEK MEDICAL CARE IF:   You have any questions about your medications.   Your pain continues to increase.  Document Released: 04/18/2009 Document Revised: 06/10/2011 Document Reviewed: 04/18/2009 [employer] Patient Information 2012 [employer]. 

## 2011-12-16 ENCOUNTER — Encounter: Payer: Self-pay | Admitting: Physical Therapy

## 2011-12-16 ENCOUNTER — Ambulatory Visit (HOSPITAL_COMMUNITY)
Admission: RE | Admit: 2011-12-16 | Discharge: 2011-12-16 | Disposition: A | Discharge status: Home / Self Care | Payer: Self-pay | Source: Ambulatory Visit | Attending: Physical Medicine and Rehabilitation | Admitting: Physical Medicine and Rehabilitation

## 2011-12-16 ENCOUNTER — Telehealth: Payer: Self-pay | Admitting: *Deleted

## 2011-12-16 DIAGNOSIS — M542 Cervicalgia: Secondary | ICD-10-CM

## 2011-12-16 DIAGNOSIS — R2689 Other abnormalities of gait and mobility: Secondary | ICD-10-CM

## 2011-12-16 DIAGNOSIS — R29898 Other symptoms and signs involving the musculoskeletal system: Secondary | ICD-10-CM

## 2011-12-16 NOTE — Telephone Encounter (Signed)
Matthew Colon called from [facility] and state that they put him in the MRI machine and he "freaked out" and they could not complete.  He will need to be rescheduled

## 2011-12-24 ENCOUNTER — Encounter: Payer: Self-pay | Admitting: Physical Therapy

## 2012-01-07 ENCOUNTER — Telehealth: Payer: Self-pay | Admitting: Physical Medicine and Rehabilitation

## 2012-01-07 NOTE — Telephone Encounter (Signed)
Please advise 

## 2012-01-07 NOTE — Telephone Encounter (Signed)
Needs new order for MRI - OPEN.  Also, needs pain medication.

## 2012-01-24 NOTE — Telephone Encounter (Signed)
Open Cervical MRI Without contrast  Patient was unable to tolerate MRI recently and has requested open MRI

## 2012-03-08 ENCOUNTER — Ambulatory Visit: Payer: Self-pay | Admitting: Physical Medicine and Rehabilitation

## 2012-05-24 ENCOUNTER — Encounter (HOSPITAL_COMMUNITY): Payer: Self-pay | Admitting: Emergency Medicine

## 2012-05-24 ENCOUNTER — Emergency Department (HOSPITAL_COMMUNITY)
Admission: EM | Admit: 2012-05-24 | Discharge: 2012-05-24 | Disposition: A | Discharge status: Home / Self Care | Payer: Self-pay | Attending: Emergency Medicine | Admitting: Emergency Medicine

## 2012-05-24 DIAGNOSIS — Z760 Encounter for issue of repeat prescription: Secondary | ICD-10-CM | POA: Insufficient documentation

## 2012-05-24 DIAGNOSIS — M25569 Pain in unspecified knee: Secondary | ICD-10-CM | POA: Insufficient documentation

## 2012-05-24 DIAGNOSIS — M129 Arthropathy, unspecified: Secondary | ICD-10-CM | POA: Insufficient documentation

## 2012-05-24 DIAGNOSIS — F17200 Nicotine dependence, unspecified, uncomplicated: Secondary | ICD-10-CM | POA: Insufficient documentation

## 2012-05-24 DIAGNOSIS — E119 Type 2 diabetes mellitus without complications: Secondary | ICD-10-CM

## 2012-05-24 DIAGNOSIS — M1990 Unspecified osteoarthritis, unspecified site: Secondary | ICD-10-CM

## 2012-05-24 DIAGNOSIS — I10 Essential (primary) hypertension: Secondary | ICD-10-CM

## 2012-05-24 DIAGNOSIS — L0231 Cutaneous abscess of buttock: Secondary | ICD-10-CM | POA: Insufficient documentation

## 2012-05-24 LAB — GLUCOSE, CAPILLARY

## 2012-05-24 MED ORDER — METFORMIN HCL 1000 MG PO TABS: 1000.0000 mg | ORAL_TABLET | Freq: Two times a day (BID) | ORAL | Status: DC

## 2012-05-24 MED ORDER — OXYCODONE-ACETAMINOPHEN 5-325 MG PO TABS: 2.0000 | ORAL_TABLET | ORAL | Status: DC | PRN

## 2012-05-24 MED ORDER — LISINOPRIL-HYDROCHLOROTHIAZIDE 20-25 MG PO TABS: 1.0000 | ORAL_TABLET | Freq: Every day | ORAL | Status: DC

## 2012-05-24 MED ORDER — OXYCODONE-ACETAMINOPHEN 5-325 MG PO TABS
2.0000 | ORAL_TABLET | Freq: Once | ORAL | Status: AC
  Administered 2012-05-24: 2 via ORAL
  Filled 2012-05-24: qty 2

## 2012-05-24 MED ORDER — PRAVASTATIN SODIUM 20 MG PO TABS: 20.0000 mg | ORAL_TABLET | Freq: Every day | ORAL | Status: DC

## 2012-05-24 MED ORDER — CEPHALEXIN 500 MG PO CAPS: 500.0000 mg | ORAL_CAPSULE | Freq: Four times a day (QID) | ORAL | Status: DC

## 2012-05-24 NOTE — ED Provider Notes (Signed)
History    This chart was scribed for Cheri Guppy, MD, MD by Smitty Pluck, ED Scribe. The patient was seen in room TR05C and the patient's care was started at 11:48AM.   CSN: [id number]  Arrival date & time 05/24/12  1117   None     Chief Complaint  Patient presents with  . Abscess  . Shoulder Pain  . Knee Pain    (Consider location/radiation/quality/duration/timing/severity/associated sxs/prior treatment) Patient is a 56 y.o. male presenting with abscess, shoulder pain, and knee pain. The history is provided by the patient. No language interpreter was used.  Abscess  Pertinent negatives include no fever and no vomiting.  Shoulder Pain Pertinent negatives include no shortness of breath.  Knee Pain Pertinent negatives include no shortness of breath.   Matthew Colon is a 56 y.o. male who presents to the Emergency Department complaining of constant, moderate right knee and right shoulder pain that has been ongoing but symptoms have increased recently due to not having arthritis and pain medication. Pt reports that he was a patient at [facility] and since it closed has not been able to get medication for shoulder and knee pain. Pt reports that he has abscess on buttocks that onset 1 day ago. Pt thinks that abscess ruptured last night and drained. Denies fevers, chills, nausea and vomiting.   Past Medical History  Diagnosis Date  . Diabetes mellitus   . Hypertension     History reviewed. No pertinent past surgical history.  Family History  Problem Relation Age of Onset  . Diabetes Mother   . Hypertension Mother   . Diabetes Sister     History  Substance Use Topics  . Smoking status: Current Some Day Smoker  . Smokeless tobacco: Not on file  . Alcohol Use: No      Review of Systems  Constitutional: Negative for fever and chills.  Respiratory: Negative for shortness of breath.   Gastrointestinal: Negative for nausea and vomiting.  Neurological: Negative for  weakness.  All other systems reviewed and are negative.    Allergies  Review of patient's allergies indicates no known allergies.  Home Medications   Current Outpatient Rx  Name  Route  Sig  Dispense  Refill  . ACETAMINOPHEN 500 MG PO TABS   Oral   Take 2,000 mg by mouth every 6 (six) hours as needed. For pain           BP 136/105  Pulse 91  Temp 97.9 F (36.6 C) (Oral)  Resp 18  SpO2 98%  Physical Exam  Nursing note and vitals reviewed. Constitutional: He is oriented to person, place, and time. He appears well-developed and well-nourished. No distress.  HENT:  Head: Normocephalic and atraumatic.  Eyes: EOM are normal.  Neck: Neck supple. No tracheal deviation present.  Cardiovascular: Normal rate.   Pulmonary/Chest: Effort normal. No respiratory distress.  Musculoskeletal: Normal range of motion. He exhibits no edema and no tenderness.  Neurological: He is alert and oriented to person, place, and time.  Skin: Skin is warm and dry. No rash noted.       No evidence of abscess   Psychiatric: He has a normal mood and affect. His behavior is normal.    ED Course  Procedures (including critical care time) DIAGNOSTIC STUDIES: Oxygen Saturation is 98% on room air, normal by my interpretation.    COORDINATION OF CARE: 11:50 AM Discussed ED treatment with pt  11:59 AM Ordered:    . [COMPLETED] oxyCODONE-acetaminophen  2 tablet Oral Once       Labs Reviewed  GLUCOSE, CAPILLARY - Abnormal; Notable for the following:    Glucose-Capillary 102 (*)     All other components within normal limits   No results found.   No diagnosis found.    MDM  Arthritis No abscess.  Med refill       I personally performed the services described in this documentation, which was scribed in my presence. The recorded information has been reviewed and is accurate.    Cheri Guppy, MD 05/24/12 1255

## 2012-05-24 NOTE — ED Notes (Signed)
Pt c/o abscess to buttocks that bust yesterday; pt sts right shoulder pain and right knee pain from RA and denies obvious injury

## 2012-06-15 ENCOUNTER — Emergency Department (HOSPITAL_COMMUNITY)
Admission: EM | Admit: 2012-06-15 | Discharge: 2012-06-15 | Disposition: A | Discharge status: Home / Self Care | Payer: Self-pay | Attending: Emergency Medicine | Admitting: Emergency Medicine

## 2012-06-15 ENCOUNTER — Encounter (HOSPITAL_COMMUNITY): Payer: Self-pay | Admitting: Emergency Medicine

## 2012-06-15 DIAGNOSIS — F17200 Nicotine dependence, unspecified, uncomplicated: Secondary | ICD-10-CM | POA: Insufficient documentation

## 2012-06-15 DIAGNOSIS — E119 Type 2 diabetes mellitus without complications: Secondary | ICD-10-CM | POA: Insufficient documentation

## 2012-06-15 DIAGNOSIS — M25559 Pain in unspecified hip: Secondary | ICD-10-CM | POA: Insufficient documentation

## 2012-06-15 DIAGNOSIS — Z794 Long term (current) use of insulin: Secondary | ICD-10-CM | POA: Insufficient documentation

## 2012-06-15 DIAGNOSIS — I10 Essential (primary) hypertension: Secondary | ICD-10-CM | POA: Insufficient documentation

## 2012-06-15 DIAGNOSIS — Z79899 Other long term (current) drug therapy: Secondary | ICD-10-CM | POA: Insufficient documentation

## 2012-06-15 DIAGNOSIS — G8929 Other chronic pain: Secondary | ICD-10-CM

## 2012-06-15 DIAGNOSIS — M25569 Pain in unspecified knee: Secondary | ICD-10-CM | POA: Insufficient documentation

## 2012-06-15 MED ORDER — HYDROCODONE-ACETAMINOPHEN 5-325 MG PO TABS
2.0000 | ORAL_TABLET | Freq: Once | ORAL | Status: AC
  Administered 2012-06-15: 2 via ORAL
  Filled 2012-06-15: qty 2

## 2012-06-15 MED ORDER — HYDROCHLOROTHIAZIDE 25 MG PO TABS: 25.0000 mg | ORAL_TABLET | Freq: Every day | ORAL | Status: DC

## 2012-06-15 MED ORDER — METFORMIN HCL 1000 MG PO TABS: 1000.0000 mg | ORAL_TABLET | Freq: Two times a day (BID) | ORAL | Status: DC

## 2012-06-15 MED ORDER — HYDROCODONE-ACETAMINOPHEN 5-325 MG PO TABS: 2.0000 | ORAL_TABLET | Freq: Four times a day (QID) | ORAL | Status: DC | PRN

## 2012-06-15 MED ORDER — HYDROCODONE-ACETAMINOPHEN 5-500 MG PO TABS: 1.0000 | ORAL_TABLET | Freq: Four times a day (QID) | ORAL | Status: DC | PRN

## 2012-06-15 MED ORDER — LISINOPRIL 20 MG PO TABS: 10.0000 mg | ORAL_TABLET | Freq: Every day | ORAL | Status: DC

## 2012-06-15 NOTE — ED Notes (Signed)
Pt c/o generalized joint pain and swelling in right wrist; pt denies obvious injury and sts seen here for same

## 2012-06-15 NOTE — ED Notes (Signed)
cbg 87 

## 2012-06-15 NOTE — ED Provider Notes (Signed)
History   This chart was scribed for Matthew Sou, MD by Leone Payor, ED Scribe. This patient was seen in room TR06C/TR06C and the patient's care was started at 3:53PM.   CSN: [id number]  Arrival date & time 06/15/12  1445   First MD Initiated Contact with Patient 06/15/12 1553      Chief Complaint  Patient presents with  . Wrist Pain     The history is provided by the patient. No language interpreter was used.    [name] is a 56 y.o. male who presents to the Emergency Department complaining of constant, moderate generalized joint pain and swelling to right wrist starting 2-3 days ago. Pt denies obvious injury to the area and states he has been seen here for the same. Pt also complains of pain from his rheumatoid arthritis in his right knee starting 3-4 months ago. Pt reports taking tylenol for his arthritis pains with minimal relief. He reports of running out of his arthritis and pain medications.    Pt has h/o DM and HTN.  Pt is a current everyday smoker but denies alcohol use. Past Medical History  Diagnosis Date  . Diabetes mellitus   . Hypertension     History reviewed. No pertinent past surgical history.  Family History  Problem Relation Age of Onset  . Diabetes Mother   . Hypertension Mother   . Diabetes Sister     History  Substance Use Topics  . Smoking status: Current Some Day Smoker  . Smokeless tobacco: Not on file  . Alcohol Use: No      Review of Systems  Constitutional: Negative.   HENT: Negative.   Respiratory: Negative.   Cardiovascular: Negative.   Gastrointestinal: Negative.   Musculoskeletal: Positive for joint swelling (to right wrist ) and arthralgias (to right wrist and right knee).  Skin: Negative.   Neurological: Negative.   Hematological: Negative.   Psychiatric/Behavioral: Negative.     Allergies  Review of patient's allergies indicates no known allergies.  Home Medications   Current Outpatient Rx  Name  Route   Sig  Dispense  Refill  . ACETAMINOPHEN 500 MG PO TABS   Oral   Take 2,000 mg by mouth every 6 (six) hours as needed. For pain         . INSULIN GLARGINE 100 UNIT/ML [place] SOLN   Subcutaneous   Inject 40-45 Units into the skin at bedtime.         Marland Kitchen LISINOPRIL-HYDROCHLOROTHIAZIDE 20-25 MG PO TABS   Oral   Take 1 tablet by mouth daily.   30 tablet   30   . METFORMIN HCL 1000 MG PO TABS   Oral   Take 1 tablet (1,000 mg total) by mouth 2 (two) times daily.   60 tablet   6   . PRAVASTATIN SODIUM 20 MG PO TABS   Oral   Take 1 tablet (20 mg total) by mouth daily.   30 tablet   6     BP 164/97  Pulse 96  Temp 97.7 F (36.5 C) (Oral)  Resp 18  SpO2 98%  Physical Exam  Nursing note and vitals reviewed. Constitutional: He appears well-developed and well-nourished.  HENT:  Head: Normocephalic and atraumatic.  Eyes: Conjunctivae normal are normal. Pupils are equal, round, and reactive to light.  Neck: Neck supple. No tracheal deviation present. No thyromegaly present.  Cardiovascular: Normal rate and regular rhythm.   No murmur heard. Pulmonary/Chest: Effort normal and breath sounds normal.  Abdominal: Soft. Bowel sounds are normal. He exhibits no distension. There is no tenderness.  Musculoskeletal: Normal range of motion. He exhibits no edema and no tenderness.       RUE- no swelling, no redness, no deformity, not warm. Tender at radial apsect of wrist.  RLE- no swelling, no deformity. Mild tenderness at anterior aspect of knee.   Neurological: He is alert. Coordination normal.  Skin: Skin is warm and dry. No rash noted.  Psychiatric: He has a normal mood and affect.    ED Course  Procedures (including critical care time)  DIAGNOSTIC STUDIES: Oxygen Saturation is 98% on room air, normal by my interpretation.    COORDINATION OF CARE:  5 PM wrist pain improved after treatment with Vicodin and Velcro wrist splint  Labs Reviewed - No data to display No results  found.   No diagnosis found.  Results for orders placed during the hospital encounter of 06/15/12  GLUCOSE, CAPILLARY      Component Value Range   Glucose-Capillary 87  70 - 99 mg/dL   No results found.   MDM  Plan prescriptions Norco, lisinopril, HCTZ, metformin, referral [place] [facility] and resource guide blood pressure recheck 2 weeks Diagnosis #1 arthralgias #2 hypertension   I personally performed the services described in this documentation, which was scribed in my presence. The recorded information has been reviewed and is accurate.    Matthew Sou, MD 06/15/12 [phone]

## 2012-06-15 NOTE — Progress Notes (Signed)
Orthopedic Tech Progress Note Patient Details:  Matthew Colon 27-Jun-1956 [id number]  Ortho Devices Type of Ortho Device: Velcro wrist splint Ortho Device/Splint Location: right wrist Ortho Device/Splint Interventions: Application   Nikki Dom 06/15/2012, 4:45 PM

## 2012-06-15 NOTE — ED Notes (Signed)
Pt former [facility] patient. Here 11/20 for c/o joint pain, "rheumatoid arthritis". Was referred to Rheumatologist but states does not have the money to pay. Here today for continued joint pain, requesting pain meds. Pt is diabetic on Metformin.

## 2012-10-24 ENCOUNTER — Encounter (HOSPITAL_COMMUNITY): Payer: Self-pay | Admitting: *Deleted

## 2012-10-24 ENCOUNTER — Emergency Department (INDEPENDENT_AMBULATORY_CARE_PROVIDER_SITE_OTHER)
Admission: EM | Admit: 2012-10-24 | Discharge: 2012-10-24 | Disposition: A | Discharge status: Home / Self Care | Payer: PRIVATE HEALTH INSURANCE | Source: Home / Self Care | Attending: Emergency Medicine | Admitting: Emergency Medicine

## 2012-10-24 DIAGNOSIS — I10 Essential (primary) hypertension: Secondary | ICD-10-CM

## 2012-10-24 DIAGNOSIS — R51 Headache: Secondary | ICD-10-CM

## 2012-10-24 DIAGNOSIS — M2550 Pain in unspecified joint: Secondary | ICD-10-CM

## 2012-10-24 DIAGNOSIS — E119 Type 2 diabetes mellitus without complications: Secondary | ICD-10-CM

## 2012-10-24 LAB — POCT I-STAT, CHEM 8
Creatinine, Ser: 1 mg/dL (ref 0.50–1.35)
Glucose, Bld: 77 mg/dL (ref 70–99)
Hemoglobin: 14.3 g/dL (ref 13.0–17.0)
Potassium: 4.2 mEq/L (ref 3.5–5.1)

## 2012-10-24 MED ORDER — MELOXICAM 15 MG PO TABS: 15.0000 mg | ORAL_TABLET | Freq: Every day | ORAL | Status: DC

## 2012-10-24 MED ORDER — AMITRIPTYLINE HCL 25 MG PO TABS: 25.0000 mg | ORAL_TABLET | Freq: Every day | ORAL | Status: DC

## 2012-10-24 MED ORDER — HYDROCODONE-ACETAMINOPHEN 5-325 MG PO TABS
2.0000 | ORAL_TABLET | Freq: Once | ORAL | Status: AC
  Administered 2012-10-24: 2 via ORAL

## 2012-10-24 MED ORDER — HYDROCODONE-ACETAMINOPHEN 5-325 MG PO TABS
ORAL_TABLET | ORAL | Status: AC
  Filled 2012-10-24: qty 2

## 2012-10-24 MED ORDER — OXYCODONE-ACETAMINOPHEN 5-325 MG PO TABS: ORAL_TABLET | ORAL | Status: DC

## 2012-10-24 MED ORDER — IBUPROFEN 800 MG PO TABS
800.0000 mg | ORAL_TABLET | Freq: Once | ORAL | Status: AC
  Administered 2012-10-24: 800 mg via ORAL

## 2012-10-24 MED ORDER — LISINOPRIL 10 MG PO TABS: 10.0000 mg | ORAL_TABLET | Freq: Every day | ORAL | Status: DC

## 2012-10-24 MED ORDER — METFORMIN HCL 500 MG PO TABS: 500.0000 mg | ORAL_TABLET | Freq: Two times a day (BID) | ORAL | Status: DC

## 2012-10-24 MED ORDER — IBUPROFEN 800 MG PO TABS
ORAL_TABLET | ORAL | Status: AC
  Filled 2012-10-24: qty 1

## 2012-10-24 NOTE — ED Notes (Signed)
C/o joint pain for years.  C/o sinus pain onset last night.  Has stuffy/runny nose. No cough, chills or fever.  No sore throat or ear ache.

## 2012-10-24 NOTE — ED Provider Notes (Signed)
Chief Complaint:   Chief Complaint  Patient presents with  . Joint Pain  . Facial Pain    History of Present Illness:   Matthew Colon is a 57 year old male who was previously a patient at [facility]. He has been diagnosed with rheumatoid arthritis, diabetes, and hypertension, and of course has run out of all his medication for these problems. His diagnosis of rheumatoid arthritis dates back about 2 years ago. I'm not certain whether this was on the basis of symptoms or by serological means. He complains of aching in his knees, elbows, shoulders, and fingers. He denies any swelling of the joints but has had "knots in his fingers". He has morning stiffness lasting about 30 minutes. He has never been given any specific treatment for the rheumatoid arthritis. At one point he was seen at a pain clinic by Dr. Cephus Slater. For some reason he stopped going there. He states the only thing that controls the pain is Percocet. He's been taking as many as 4 of these at a time with 2 capsules taken 2 hours later which is definitely an overdose. He does not have any history of drug seeking behavior drug abuse. Review of the [place] not reveal any controlled substances filled in the last 3 months. He also complains today of a two-day history of nasal congestion with clear rhinorrhea, sinus pain, and headache. He has had diabetes and hypertension for several years. He was on lisinopril, dose unknown, for the high blood pressure. No medication side effects and no headaches, dizziness, shortness of breath, or chest pain. He was also on Lantus insulin 40 units at bedtime and metformin for his diabetes. He's been off of these since last September as well. He denies polyuria, polydipsia, blurry vision. It should be noted that he has lost a significant amount of weight through dieting in the last year.  Review of Systems:  Other than noted above, the patient denies any of the  following symptoms. Systemic:  No fever, chills, sweats, fatigue, myalgias, headache, or anorexia. Eye:  No redness, pain or drainage. ENT:  No earache, nasal congestion, rhinorrhea, sinus pressure, or sore throat. Lungs:  No cough, sputum production, wheezing, shortness of breath.  Cardiovascular:  No chest pain, palpitations, or syncope. GI:  No nausea, vomiting, abdominal pain or diarrhea. GU:  No dysuria, frequency, or hematuria. Skin:  No rash or pruritis.  PMFSH:  Past medical history, family history, social history, meds, and allergies were reviewed.  He is not taking any medications right now.  Physical Exam:   Vital signs:  BP 140/86  Pulse 76  Temp(Src) 98.6 F (37 C) (Oral)  Resp 16  SpO2 98% General:  Alert, in no distress. Eye:  PERRL, full EOMs.  Lids and conjunctivas were normal. ENT:  TMs and canals were normal, without erythema or inflammation.  Nasal mucosa was clear and uncongested, without drainage.  Mucous membranes were moist.  Pharynx was clear, without exudate or drainage.  There were no oral ulcerations or lesions. Neck:  Supple, no adenopathy, tenderness or mass. Thyroid was normal. Lungs:  No respiratory distress.  Lungs were clear to auscultation, without wheezes, rales or rhonchi.  Breath sounds were clear and equal bilaterally. Heart:  Regular rhythm, without gallops, murmers or rubs. Abdomen:  Soft, flat, and non-tender to palpation.  No hepatosplenomagaly or mass. Extremities: He has pain to palpation in all joints and muscle areas. There is no swelling or evidence of synovitis. All  joints have a full range of motion but with lots of pain with movement and with even light touch. Skin:  Clear, warm, and dry, without rash or lesions.  Labs:   Results for orders placed during the hospital encounter of 10/24/12  POCT I-STAT, CHEM 8      Result Value Range   Sodium 142  135 - 145 mEq/L   Potassium 4.2  3.5 - 5.1 mEq/L   Chloride 105  96 - 112 mEq/L   BUN  16  6 - 23 mg/dL   Creatinine, Ser 1.61  0.50 - 1.35 mg/dL   Glucose, Bld 77  70 - 99 mg/dL   Calcium, Ion 0.96 (*) 1.12 - 1.23 mmol/L   TCO2 29  0 - 100 mmol/L   Hemoglobin 14.3  13.0 - 17.0 g/dL   HCT 04.5  40.9 - 81.1 %    Course in Urgent Care Center:   For pain he was given ibuprofen 800 mg by mouth and Norco 5/325, 2 capsules by mouth with good relief of his pain.  Assessment:  The primary encounter diagnosis was Arthralgia. Diagnoses of Headache, Type 2 diabetes mellitus, and Hypertension were also pertinent to this visit.  I'm not sure that he really has rheumatoid arthritis. Certainly his physical exam does not appear this out. Even with positive serologies, this is not diagnostic. His picture today seems more of a fibromyalgia type syndrome. I think he needs to see a rheumatologist, but has no money or insurance, so he will have to get an orange card and go through the adult medical clinic. I did give her prescription for amitriptyline at bedtime, a small prescription for Percocet and told him not to take as many as he had been taking, taking no more than one or 2 every 6 hours. I also gave him a prescription for meloxicam. It does not look like he needs the Lantus insulin any longer, although he may need metformin, so he was given a prescription for low dose of this and also a low dose of lisinopril. I urged him to followup at the adult clinic as soon as possible.  Plan:   1.  The following meds were prescribed:   Discharge Medication List as of 10/24/2012  6:31 PM    START taking these medications   Details  amitriptyline (ELAVIL) 25 MG tablet Take 1 tablet (25 mg total) by mouth at bedtime., Starting 10/24/2012, Until Discontinued, Normal    !! lisinopril (PRINIVIL,ZESTRIL) 10 MG tablet Take 1 tablet (10 mg total) by mouth daily., Starting 10/24/2012, Until Discontinued, Normal    meloxicam (MOBIC) 15 MG tablet Take 1 tablet (15 mg total) by mouth daily., Starting 10/24/2012, Until  Discontinued, Normal    !! metFORMIN (GLUCOPHAGE) 500 MG tablet Take 1 tablet (500 mg total) by mouth 2 (two) times daily with a meal., Starting 10/24/2012, Until Discontinued, Normal    oxyCODONE-acetaminophen (PERCOCET) 5-325 MG per tablet 1 to 2 tablets every 6 hours as needed for pain., Print     !! - Potential duplicate medications found. Please discuss with provider.     2.  The patient was instructed in symptomatic care and handouts were given. 3.  The patient was told to return if becoming worse in any way, if no better in 3 or 4 days, and given some red flag symptoms such as fever or other neurological symptoms that would indicate earlier return.    Reuben Likes, MD 10/24/12 2035

## 2012-11-01 ENCOUNTER — Emergency Department (HOSPITAL_COMMUNITY)
Admission: EM | Admit: 2012-11-01 | Discharge: 2012-11-01 | Disposition: A | Discharge status: Home / Self Care | Payer: Self-pay | Attending: Emergency Medicine | Admitting: Emergency Medicine

## 2012-11-01 DIAGNOSIS — E119 Type 2 diabetes mellitus without complications: Secondary | ICD-10-CM | POA: Insufficient documentation

## 2012-11-01 DIAGNOSIS — I10 Essential (primary) hypertension: Secondary | ICD-10-CM | POA: Insufficient documentation

## 2012-11-01 DIAGNOSIS — M069 Rheumatoid arthritis, unspecified: Secondary | ICD-10-CM | POA: Insufficient documentation

## 2012-11-01 DIAGNOSIS — Z79899 Other long term (current) drug therapy: Secondary | ICD-10-CM | POA: Insufficient documentation

## 2012-11-01 DIAGNOSIS — G8929 Other chronic pain: Secondary | ICD-10-CM | POA: Insufficient documentation

## 2012-11-01 DIAGNOSIS — I011 Acute rheumatic endocarditis: Secondary | ICD-10-CM

## 2012-11-01 MED ORDER — OXYCODONE-ACETAMINOPHEN 5-325 MG PO TABS
1.0000 | ORAL_TABLET | Freq: Once | ORAL | Status: AC
  Administered 2012-11-01: 1 via ORAL
  Filled 2012-11-01: qty 1

## 2012-11-01 MED ORDER — OXYCODONE-ACETAMINOPHEN 5-325 MG PO TABS: 1.0000 | ORAL_TABLET | ORAL | Status: DC | PRN

## 2012-11-01 NOTE — ED Provider Notes (Signed)
Medical screening examination/treatment/procedure(s) were performed by non-physician practitioner and as supervising physician I was immediately available for consultation/collaboration.   Herberth B. Tiernan Suto, MD 11/01/12 2335 

## 2012-11-01 NOTE — ED Provider Notes (Signed)
History    This chart was scribed for non-physician practitioner Dorthula Matas, PA-C working with Bonnita Levan. Bernette Mayers, MD by Gerlean Ren, ED Scribe. This patient was seen in room TR07C/TR07C and the patient's care was started at 9:05 PM.    CSN: [id number]  Arrival date & time 11/01/12  2002   First MD Initiated Contact with Patient 11/01/12 2044      Chief Complaint  Patient presents with  . Arthritis     The history is provided by the patient. No language interpreter was used.  Matthew Colon is a 57 y.o. male who presents to the Emergency Department complaining of flare up in RA pain in left shoulder and bilateral knees, right worse than left.  Pt was seen here 4/22 and given Percocet that pt states he has run out of.  Pt denies any new injuries or traumas to bilateral knees.   Pt was [facility] pt but [facility] has since closed down and he no longer has anyone to manage his RA pain.  Pt denies fever, cough, unexpected weight loss.  Pt has h/o DM, HTN.   Past Medical History  Diagnosis Date  . Diabetes mellitus   . Hypertension     No past surgical history on file.  Family History  Problem Relation Age of Onset  . Diabetes Mother   . Hypertension Mother   . Diabetes Sister     History  Substance Use Topics  . Smoking status: Never Smoker   . Smokeless tobacco: Not on file  . Alcohol Use: Yes     Comment: occasional beer      Review of Systems  Constitutional: Negative for fever and chills.  Respiratory: Negative for shortness of breath.   Gastrointestinal: Negative for nausea and vomiting.  Musculoskeletal:       Positive bilateral RA knee pain  Skin: Negative for wound.  Neurological: Negative for weakness.  All other systems reviewed and are negative.    Allergies  Review of patient's allergies indicates no known allergies.  Home Medications   Current Outpatient Rx  Name  Route  Sig  Dispense  Refill  . acetaminophen (TYLENOL) 500 MG  tablet   Oral   Take 2,000 mg by mouth every 6 (six) hours as needed. For pain         . lisinopril (PRINIVIL,ZESTRIL) 20 MG tablet   Oral   Take 0.5 tablets (10 mg total) by mouth daily.   30 tablet   0   . metFORMIN (GLUCOPHAGE) 500 MG tablet   Oral   Take 1 tablet (500 mg total) by mouth 2 (two) times daily with a meal.   60 tablet   2   . metFORMIN (GLUCOPHAGE) 500 MG tablet   Oral   Take 500 mg by mouth 2 (two) times daily with a meal.         . oxyCODONE-acetaminophen (PERCOCET) 5-325 MG per tablet      1 to 2 tablets every 6 hours as needed for pain.   20 tablet   0   . oxyCODONE-acetaminophen (PERCOCET/ROXICET) 5-325 MG per tablet   Oral   Take 1 tablet by mouth every 4 (four) hours as needed for pain.   20 tablet   0     BP 144/66  Pulse 86  Temp(Src) 97.8 F (36.6 C) (Oral)  Resp 16  SpO2 100%  Physical Exam  Nursing note and vitals reviewed. Constitutional: He is oriented to person,  place, and time. He appears well-developed and well-nourished. No distress.  HENT:  Head: Normocephalic and atraumatic.  Eyes: EOM are normal.  Neck: Neck supple. No tracheal deviation present.  Cardiovascular: Normal rate.   Pulmonary/Chest: Effort normal. No respiratory distress.  Musculoskeletal: Normal range of motion.  Diffuse joint pains with evidence of chronic swelling. NO cellulitis or open wounds  Neurological: He is alert and oriented to person, place, and time.  Skin: Skin is warm and dry.  Psychiatric: He has a normal mood and affect. His behavior is normal.    ED Course  Procedures (including critical care time) DIAGNOSTIC STUDIES: Oxygen Saturation is 100% on room air, normal by my interpretation.    COORDINATION OF CARE: 9:13 PM- Informed pt that I will provide pain relief here and short term prescription.  Informed pt I will provide contact information for pain management in [place].  Pt verbalizes understanding.     1. Chronic pain   2.  Rheumatoid aortitis       MDM  Exacerbation of chronic pain/ RA- serologically positive. Is getting plugged in with [place] [facility], has already spoken with them but needs referral.  Pt has been advised of the symptoms that warrant their return to the ED. Patient has voiced understanding and has agreed to follow-up with the PCP or specialist.    I personally performed the services described in this documentation, which was scribed in my presence. The recorded information has been reviewed and is accurate.    Dorthula Matas, PA-C 11/01/12 2140

## 2012-11-01 NOTE — ED Notes (Signed)
PT. REPORTS CHRONIC ARTHRITIC PAIN AT BILATERAL KNEES GOT WORSE TODAY , DENIES INJURY , RAN OUT OF PRESCRIPTION PERCOCET , AMBULATORY.

## 2012-12-21 ENCOUNTER — Emergency Department (HOSPITAL_COMMUNITY)
Admission: EM | Admit: 2012-12-21 | Discharge: 2012-12-21 | Disposition: A | Discharge status: Home / Self Care | Payer: Self-pay | Attending: Emergency Medicine | Admitting: Emergency Medicine

## 2012-12-21 ENCOUNTER — Encounter (HOSPITAL_COMMUNITY): Payer: Self-pay | Admitting: Family Medicine

## 2012-12-21 ENCOUNTER — Emergency Department (HOSPITAL_COMMUNITY): Payer: Self-pay

## 2012-12-21 DIAGNOSIS — M25469 Effusion, unspecified knee: Secondary | ICD-10-CM | POA: Insufficient documentation

## 2012-12-21 DIAGNOSIS — I10 Essential (primary) hypertension: Secondary | ICD-10-CM | POA: Insufficient documentation

## 2012-12-21 DIAGNOSIS — M25561 Pain in right knee: Secondary | ICD-10-CM

## 2012-12-21 DIAGNOSIS — X500XXA Overexertion from strenuous movement or load, initial encounter: Secondary | ICD-10-CM | POA: Insufficient documentation

## 2012-12-21 DIAGNOSIS — Y9389 Activity, other specified: Secondary | ICD-10-CM | POA: Insufficient documentation

## 2012-12-21 DIAGNOSIS — M25461 Effusion, right knee: Secondary | ICD-10-CM

## 2012-12-21 DIAGNOSIS — Z79899 Other long term (current) drug therapy: Secondary | ICD-10-CM | POA: Insufficient documentation

## 2012-12-21 DIAGNOSIS — Y9289 Other specified places as the place of occurrence of the external cause: Secondary | ICD-10-CM | POA: Insufficient documentation

## 2012-12-21 DIAGNOSIS — E119 Type 2 diabetes mellitus without complications: Secondary | ICD-10-CM | POA: Insufficient documentation

## 2012-12-21 DIAGNOSIS — S8990XA Unspecified injury of unspecified lower leg, initial encounter: Secondary | ICD-10-CM | POA: Insufficient documentation

## 2012-12-21 DIAGNOSIS — S99929A Unspecified injury of unspecified foot, initial encounter: Secondary | ICD-10-CM | POA: Insufficient documentation

## 2012-12-21 DIAGNOSIS — Z8739 Personal history of other diseases of the musculoskeletal system and connective tissue: Secondary | ICD-10-CM | POA: Insufficient documentation

## 2012-12-21 MED ORDER — OXYCODONE-ACETAMINOPHEN 5-325 MG PO TABS
1.0000 | ORAL_TABLET | Freq: Once | ORAL | Status: AC
  Administered 2012-12-21: 1 via ORAL
  Filled 2012-12-21: qty 1

## 2012-12-21 MED ORDER — OXYCODONE-ACETAMINOPHEN 5-325 MG PO TABS: 1.0000 | ORAL_TABLET | ORAL | Status: DC | PRN

## 2012-12-21 NOTE — ED Notes (Signed)
Pt reports hearing and feeling a "pop" in right knee while moving boxes on Tuesday. Knee edematous, PMS intact, pt ambulatory to tx room

## 2012-12-21 NOTE — ED Notes (Signed)
Pt comfortable with d/c and f/u isntructions. Prescriptions x1. Pt taking bus home.

## 2012-12-21 NOTE — ED Provider Notes (Signed)
History    This chart was scribed for Matthew Colon, non-physician practitioner working with Flint Melter, MD by Leone Payor, ED Scribe. This patient was seen in room TR06C/TR06C and the patient's care was started at 1926.   CSN: [id number]  Arrival date & time 12/21/12  0865   First MD Initiated Contact with Patient 12/21/12 2003      Chief Complaint  Patient presents with  . Knee Pain    The history is provided by the patient. No language interpreter was used.    HPI Comments: Matthew Colon is a 57 y.o. male with h/o rheumatoid arthritis who presents to the Emergency Department complaining of constant, unchanged R knee pain starting 3 days ago. States he was moving boxes when he felt a "pop." He was tried to push a nightstand with his knee and states "felt his knee cap move." He now has increased swelling to the area. States his knee has been buckling when he tried to walk. Describes the pain as throbbing and rates the pain as 9/10 currently. He has taken 6 ibuprofen at once and states that did not bring him relief. Nothing alleviates the pain. Pt also reports having arthritis pain to his bilateral shoulders that has been ongoing for about 1 year. He normally takes ibuprofen for his arthritis pain. He denies weakness, numbness.    Past Medical History  Diagnosis Date  . Diabetes mellitus   . Hypertension     History reviewed. No pertinent past surgical history.  Family History  Problem Relation Age of Onset  . Diabetes Mother   . Hypertension Mother   . Diabetes Sister     History  Substance Use Topics  . Smoking status: Never Smoker   . Smokeless tobacco: Not on file  . Alcohol Use: Yes     Comment: occasional beer      Review of Systems  Constitutional: Negative for fever and chills.  Musculoskeletal: Positive for joint swelling and arthralgias (R knee pain ). Negative for back pain.  Skin: Negative for color change and wound.  Neurological: Negative for  weakness and numbness.    Allergies  Review of patient's allergies indicates no known allergies.  Home Medications   Current Outpatient Rx  Name  Route  Sig  Dispense  Refill  . amitriptyline (ELAVIL) 25 MG tablet   Oral   Take 25 mg by mouth at bedtime.         Marland Kitchen ibuprofen (ADVIL,MOTRIN) 200 MG tablet   Oral   Take 800 mg by mouth every 6 (six) hours as needed for pain.         Marland Kitchen lisinopril (PRINIVIL,ZESTRIL) 20 MG tablet   Oral   Take 20 mg by mouth daily.         . metFORMIN (GLUCOPHAGE) 500 MG tablet   Oral   Take 500 mg by mouth 2 (two) times daily with a meal.           BP 141/88  Pulse 93  Resp 18  SpO2 98%  Physical Exam  Nursing note and vitals reviewed. Constitutional: He appears well-developed and well-nourished. No distress.  HENT:  Head: Normocephalic and atraumatic.  Neck: Neck supple.  Pulmonary/Chest: Effort normal.  Musculoskeletal:       Right knee: He exhibits decreased range of motion and swelling. He exhibits no ecchymosis, no deformity, no laceration, no erythema, normal alignment, no LCL laxity and no MCL laxity.       Left  knee: Normal.       Legs: Pain with anterior and posterior drawer testing.  Pt is able to flex and extend knee but with pain.  Distal pulses intact, sensation intact.   Neurological: He is alert.  Skin: He is not diaphoretic.    ED Course  Procedures (including critical care time)  DIAGNOSTIC STUDIES: Oxygen Saturation is 98% on RA, normal by my interpretation.    COORDINATION OF CARE: 8:59 PM Discussed treatment plan with pt at bedside and pt agreed to plan.   Labs Reviewed - No data to display Dg Knee Complete 4 Views Right  12/21/2012   *RADIOLOGY REPORT*  Clinical Data: Knee pain  RIGHT KNEE - COMPLETE 4+ VIEW  Comparison: Prior radiographs of the right knee 11/19/2009  Findings: No acute fracture or malalignment.  There is a suprapatellar joint effusion which is slightly larger than seen on the prior  radiographs.  Normal bony mineralization.  Mild degenerative changes with narrowing of the medial joint space and peaking of the tibial plateaus.  No significant interval progression compared to prior.  IMPRESSION:  1.  No acute fracture or malalignment. 2.  Moderate suprapatellar joint effusion. 3.  Degenerative changes without significant interval progression compared to May 2011.   Original Report Authenticated By: Malachy Moan, M.D.   Pt declined knee immobilizer, states he has to work and would take it off during the day.    1. Right knee pain   2. Knee effusion, right     MDM  Pt with right knee pain, has hx rheumatoid arthritis, felt knee pop while using it to push heavy box several days ago.  Persistent pain and swelling to right knee.  Xray shows suprapatellar joint effusion.  Pt has pain with anterior and posterior stress.  Declined knee immobilizer.  Placed in knee sleeve and crutches, d/c home with percocet and ortho follow up.  Discussed all results with patient.  Pt given return precautions.  Pt verbalizes understanding and agrees with plan.     I doubt any other EMC precluding discharge at this time including, but not necessarily limited to the following:  Septic knee, tendon rupture   I personally performed the services described in this documentation, which was scribed in my presence. The recorded information has been reviewed and is accurate.   Matthew Dredge, PA-C 12/21/12 2346

## 2012-12-21 NOTE — ED Notes (Signed)
Pt refuses crutches 

## 2012-12-22 NOTE — ED Provider Notes (Signed)
Medical screening examination/treatment/procedure(s) were performed by non-physician practitioner and as supervising physician I was immediately available for consultation/collaboration.  Flint Melter, MD 12/22/12 [phone]

## 2013-06-13 ENCOUNTER — Emergency Department (HOSPITAL_COMMUNITY)
Admission: EM | Admit: 2013-06-13 | Discharge: 2013-06-13 | Disposition: A | Discharge status: Home / Self Care | Payer: PRIVATE HEALTH INSURANCE | Attending: Emergency Medicine | Admitting: Emergency Medicine

## 2013-06-13 ENCOUNTER — Encounter (HOSPITAL_COMMUNITY): Payer: Self-pay | Admitting: Emergency Medicine

## 2013-06-13 DIAGNOSIS — R05 Cough: Secondary | ICD-10-CM | POA: Insufficient documentation

## 2013-06-13 DIAGNOSIS — R059 Cough, unspecified: Secondary | ICD-10-CM | POA: Insufficient documentation

## 2013-06-13 DIAGNOSIS — M542 Cervicalgia: Secondary | ICD-10-CM | POA: Insufficient documentation

## 2013-06-13 DIAGNOSIS — I10 Essential (primary) hypertension: Secondary | ICD-10-CM | POA: Insufficient documentation

## 2013-06-13 DIAGNOSIS — J329 Chronic sinusitis, unspecified: Secondary | ICD-10-CM | POA: Insufficient documentation

## 2013-06-13 DIAGNOSIS — H9209 Otalgia, unspecified ear: Secondary | ICD-10-CM | POA: Insufficient documentation

## 2013-06-13 DIAGNOSIS — M25519 Pain in unspecified shoulder: Secondary | ICD-10-CM | POA: Insufficient documentation

## 2013-06-13 DIAGNOSIS — Z760 Encounter for issue of repeat prescription: Secondary | ICD-10-CM | POA: Insufficient documentation

## 2013-06-13 DIAGNOSIS — E119 Type 2 diabetes mellitus without complications: Secondary | ICD-10-CM | POA: Insufficient documentation

## 2013-06-13 DIAGNOSIS — M25511 Pain in right shoulder: Secondary | ICD-10-CM

## 2013-06-13 MED ORDER — AMOXICILLIN 500 MG PO CAPS: 500.0000 mg | ORAL_CAPSULE | Freq: Three times a day (TID) | ORAL | Status: DC

## 2013-06-13 MED ORDER — NAPROXEN 500 MG PO TABS: 500.0000 mg | ORAL_TABLET | Freq: Two times a day (BID) | ORAL | Status: DC

## 2013-06-13 MED ORDER — LISINOPRIL 20 MG PO TABS: 20.0000 mg | ORAL_TABLET | Freq: Every day | ORAL | Status: DC

## 2013-06-13 MED ORDER — FLUTICASONE PROPIONATE 50 MCG/ACT NA SUSP: 1.0000 | Freq: Every day | NASAL | Status: DC

## 2013-06-13 MED ORDER — METFORMIN HCL 500 MG PO TABS: 500.0000 mg | ORAL_TABLET | Freq: Two times a day (BID) | ORAL | Status: DC

## 2013-06-13 NOTE — ED Notes (Signed)
Pt. 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

## 2013-06-13 NOTE — ED Provider Notes (Signed)
CSN: [id number]     Arrival date & time 06/13/13  1958 History  This chart was scribed for Jaynie Crumble, PA-C, working with Shanna Cisco, MD, by Andrew Au, ED Scribe. This patient was seen in room TR07C/TR07C and the patient's care was started at 9:45 PM     Chief Complaint  Patient presents with  . Nasal Congestion   The history is provided by the patient. No language interpreter was used.   HPI Comments: Matthew Colon is a 57 y.o. Male with history of HTN and DM who presents to the Emergency Department complaining of persistent, gradually worsening nasal congestion for about 1 month ago. He states that he has had associated intermittent rhinorrhea, cough, sinus pressure and right ear pain over the past month. Pt states he breath dust at work, which has worsened his symptoms. He states that he has had "two bad colds" in the past month" that he can't get over. He reports that taking OTC allergy and sinus medications every day since the onset of symptoms without relief. He denies fever or chills.  Pt also reports intermittent, moderate reports right shoulder pain that radiates his right neck over the past 2 weeks. He states that this pain is from repeatedly having to slam a door at work.  Pt report he has high BP, along with DM and states that he hasnt had either medication for  a month. He is requesting refills of these medications.   Past Medical History  Diagnosis Date  . Diabetes mellitus   . Hypertension    History reviewed. No pertinent past surgical history. Family History  Problem Relation Age of Onset  . Diabetes Mother   . Hypertension Mother   . Diabetes Sister    History  Substance Use Topics  . Smoking status: Never Smoker   . Smokeless tobacco: Not on file  . Alcohol Use: Yes     Comment: occasional beer    Review of Systems  Constitutional: Negative for fever and chills.  HENT: Positive for congestion, ear pain (right), rhinorrhea and sinus  pressure.   Respiratory: Positive for cough.   Musculoskeletal: Positive for arthralgias ( right shoulder) and neck pain.  All other systems reviewed and are negative.    Allergies  Review of patient's allergies indicates no known allergies.  Home Medications   Current Outpatient Rx  Name  Route  Sig  Dispense  Refill  . ibuprofen (ADVIL,MOTRIN) 200 MG tablet   Oral   Take 600 mg by mouth 2 (two) times daily as needed for moderate pain.          Marland Kitchen amitriptyline (ELAVIL) 25 MG tablet   Oral   Take 25 mg by mouth at bedtime.         Marland Kitchen amoxicillin (AMOXIL) 500 MG capsule   Oral   Take 1 capsule (500 mg total) by mouth 3 (three) times daily.   30 capsule   0   . fluticasone (FLONASE) 50 MCG/ACT nasal spray   Each Nare   Place 1 spray into both nostrils daily.   16 g   2   . lisinopril (PRINIVIL,ZESTRIL) 20 MG tablet   Oral   Take 20 mg by mouth daily.         Marland Kitchen lisinopril (PRINIVIL,ZESTRIL) 20 MG tablet   Oral   Take 1 tablet (20 mg total) by mouth daily.   30 tablet   2   . metFORMIN (GLUCOPHAGE) 500 MG tablet  Oral   Take 500 mg by mouth 2 (two) times daily with a meal.         . metFORMIN (GLUCOPHAGE) 500 MG tablet   Oral   Take 1 tablet (500 mg total) by mouth 2 (two) times daily with a meal.   60 tablet   2   . naproxen (NAPROSYN) 500 MG tablet   Oral   Take 1 tablet (500 mg total) by mouth 2 (two) times daily.   30 tablet   0    Triage Vitals BP 183/92  Pulse 84  Temp(Src) 98.2 F (36.8 C) (Oral)  Resp 14  Ht 5\' 11"  (1.803 m)  Wt 274 lb (124.286 kg)  BMI 38.23 kg/m2  SpO2 98%  Physical Exam  Nursing note and vitals reviewed. Constitutional: He is oriented to person, place, and time. He appears well-developed and well-nourished. No distress.  HENT:  Head: Normocephalic and atraumatic.  Left ear: normal. Right ear: canal and external ear normal; fluid behind TM. Bilateral frontal and maxillary sinus tenderness.  Arm tender over  right trapezius muscle No tenderness over posterior or anterior shoulder joints  Eyes: EOM are normal.  Neck: Neck supple. No tracheal deviation present.  Cardiovascular: Normal rate.   Pulmonary/Chest: Effort normal and breath sounds normal. No respiratory distress. He has no wheezes. He has no rales.  Lungs clear to auscultation.   Musculoskeletal: Normal range of motion. He exhibits tenderness.  Tenderness over right trapezius muscle. No tenderness over posterior or anterior shoulder joints  Neurological: He is alert and oriented to person, place, and time.  Skin: Skin is warm and dry.  Psychiatric: He has a normal mood and affect. His behavior is normal.    ED Course  Procedures   DIAGNOSTIC STUDIES: Oxygen Saturation is 98% on RA, normal by my interpretation.    COORDINATION OF CARE: 9:50 PM- Discussed plan to get pt refill of DM and BP medication. Pt was also discharged with other medication  Pt advised of plan for treatment and pt agrees.  Labs Review Labs Reviewed - No data to display Imaging Review No results found.  EKG Interpretation   None       MDM   1. Sinusitis   2. Right shoulder pain   3. Hypertension   4. Medication refill    Pt with sinusitis, right shoulder pain, med refill, elevated bp. No BP meds for several months. Pt has significant pain to bilateral sinuses. Will start on antibiotics, decongestants, flonase. Anti inflammatories for pain. Will refill all regular meds. Follow up with PCP. Pt afebrile, non toxic appearing.   Filed Vitals:   06/13/13 2007 06/13/13 2152  BP: 183/92 132/85  Pulse: 84 75  Temp: 98.2 F (36.8 C) 98.2 F (36.8 C)  TempSrc: Oral Oral  Resp: 14 16  Height: 5\' 11"  (1.803 m)   Weight: 274 lb (124.286 kg)   SpO2: 98% 98%     I personally performed the services described in this documentation, which was scribed in my presence. The recorded information has been reviewed and is accurate.     Lottie Mussel, PA-C 06/13/13 2353

## 2013-06-13 NOTE — ED Notes (Signed)
Pt states he is out of his metformin and lisinopril, pt states it has been two months since he took either medication.

## 2013-06-14 NOTE — ED Provider Notes (Signed)
Medical screening examination/treatment/procedure(s) were performed by non-physician practitioner and as supervising physician I was immediately available for consultation/collaboration.  EKG Interpretation   None         Shanna Cisco, MD 06/14/13 [phone]

## 2013-11-08 ENCOUNTER — Emergency Department (HOSPITAL_COMMUNITY)
Admission: EM | Admit: 2013-11-08 | Discharge: 2013-11-08 | Disposition: A | Discharge status: Home / Self Care | Payer: PRIVATE HEALTH INSURANCE | Attending: Emergency Medicine | Admitting: Emergency Medicine

## 2013-11-08 ENCOUNTER — Encounter (HOSPITAL_COMMUNITY): Payer: Self-pay | Admitting: Emergency Medicine

## 2013-11-08 DIAGNOSIS — E119 Type 2 diabetes mellitus without complications: Secondary | ICD-10-CM | POA: Insufficient documentation

## 2013-11-08 DIAGNOSIS — I10 Essential (primary) hypertension: Secondary | ICD-10-CM | POA: Insufficient documentation

## 2013-11-08 DIAGNOSIS — M069 Rheumatoid arthritis, unspecified: Secondary | ICD-10-CM | POA: Insufficient documentation

## 2013-11-08 DIAGNOSIS — M25519 Pain in unspecified shoulder: Secondary | ICD-10-CM | POA: Insufficient documentation

## 2013-11-08 DIAGNOSIS — M25569 Pain in unspecified knee: Secondary | ICD-10-CM | POA: Insufficient documentation

## 2013-11-08 DIAGNOSIS — G8929 Other chronic pain: Secondary | ICD-10-CM | POA: Insufficient documentation

## 2013-11-08 DIAGNOSIS — M2550 Pain in unspecified joint: Secondary | ICD-10-CM

## 2013-11-08 DIAGNOSIS — Z79899 Other long term (current) drug therapy: Secondary | ICD-10-CM | POA: Insufficient documentation

## 2013-11-08 MED ORDER — METFORMIN HCL 500 MG PO TABS: 500.0000 mg | ORAL_TABLET | Freq: Two times a day (BID) | ORAL | Status: DC

## 2013-11-08 MED ORDER — OXYCODONE-ACETAMINOPHEN 5-325 MG PO TABS: 1.0000 | ORAL_TABLET | Freq: Four times a day (QID) | ORAL | Status: DC | PRN

## 2013-11-08 MED ORDER — OXYCODONE-ACETAMINOPHEN 5-325 MG PO TABS
2.0000 | ORAL_TABLET | Freq: Once | ORAL | Status: AC
  Administered 2013-11-08: 2 via ORAL
  Filled 2013-11-08: qty 2

## 2013-11-08 MED ORDER — LISINOPRIL 20 MG PO TABS: 20.0000 mg | ORAL_TABLET | Freq: Every day | ORAL | Status: DC

## 2013-11-08 NOTE — Discharge Instructions (Signed)
Arthralgia  Your caregiver has diagnosed you as suffering from an arthralgia. Arthralgia means there is pain in a joint. This can come from many reasons including:  · Bruising the joint which causes soreness (inflammation) in the joint.  · Wear and tear on the joints which occur as we grow older (osteoarthritis).  · Overusing the joint.  · Various forms of arthritis.  · Infections of the joint.  Regardless of the cause of pain in your joint, most of these different pains respond to anti-inflammatory drugs and rest. The exception to this is when a joint is infected, and these cases are treated with antibiotics, if it is a bacterial infection.  HOME CARE INSTRUCTIONS   · Rest the injured area for as long as directed by your caregiver. Then slowly start using the joint as directed by your caregiver and as the pain allows. Crutches as directed may be useful if the ankles, knees or hips are involved. If the knee was splinted or casted, continue use and care as directed. If an stretchy or elastic wrapping bandage has been applied today, it should be removed and re-applied every 3 to 4 hours. It should not be applied tightly, but firmly enough to keep swelling down. Watch toes and feet for swelling, bluish discoloration, coldness, numbness or excessive pain. If any of these problems (symptoms) occur, remove the ace bandage and re-apply more loosely. If these symptoms persist, contact your caregiver or return to this location.  · For the first 24 hours, keep the injured extremity elevated on pillows while lying down.  · Apply ice for 15-20 minutes to the sore joint every couple hours while awake for the first half day. Then 03-04 times per day for the first 48 hours. Put the ice in a plastic bag and place a towel between the bag of ice and your skin.  · Wear any splinting, casting, elastic bandage applications, or slings as instructed.  · Only take over-the-counter or prescription medicines for pain, discomfort, or fever as  directed by your caregiver. Do not use aspirin immediately after the injury unless instructed by your physician. Aspirin can cause increased bleeding and bruising of the tissues.  · If you were given crutches, continue to use them as instructed and do not resume weight bearing on the sore joint until instructed.  Persistent pain and inability to use the sore joint as directed for more than 2 to 3 days are warning signs indicating that you should see a caregiver for a follow-up visit as soon as possible. Initially, a hairline fracture (break in bone) may not be evident on X-rays. Persistent pain and swelling indicate that further evaluation, non-weight bearing or use of the joint (use of crutches or slings as instructed), or further X-rays are indicated. X-rays may sometimes not show a small fracture until a week or 10 days later. Make a follow-up appointment with your own caregiver or one to whom we have referred you. A radiologist (specialist in reading X-rays) may read your X-rays. Make sure you know how you are to obtain your X-ray results. Do not assume everything is normal if you do not hear from us.  SEEK MEDICAL CARE IF:  Bruising, swelling, or pain increases.  SEEK IMMEDIATE MEDICAL CARE IF:   · Your fingers or toes are numb or blue.  · The pain is not responding to medications and continues to stay the same or get worse.  · The pain in your joint becomes severe.  · You   develop a fever over 102° F (38.9° C).  · It becomes impossible to move or use the joint.  MAKE SURE YOU:   · Understand these instructions.  · Will watch your condition.  · Will get help right away if you are not doing well or get worse.  Document Released: 06/21/2005 Document Revised: 09/13/2011 Document Reviewed: 02/07/2008  [employer]® Patient Information ©2014 [employer].

## 2013-11-08 NOTE — ED Provider Notes (Signed)
CSN: [id number]     Arrival date & time 11/08/13  1924 History  This chart was scribed for non-physician practitioner Montine Circle, PA-C working with Mervin Kung, MD by Eston Mould, ED Scribe. This patient was seen in room TR09C/TR09C and the patient's care was started at 8:50 PM .   Chief Complaint  Patient presents with  . Joint Pain   The history is provided by the patient. No language interpreter was used.   HPI Comments: Matthew Colon is a 58 y.o. male with a hx of rheumatoid arthritis, HTN and DM who presents to the Emergency Department complaining of joint pain to bilateral shoulder and knees (worse to his shoulders) that began 1 week ago. Pt state he has been working more and actively lifting while at work and states his pain has been progressively worsening. Reports taking Ibuprofen without any relief. States he has an apt with his orthopedist in 6 days. Pt states he had an injury to his R knee while at work. Denies any injuries.  He is also requesting a refill of his DM and HTN medications.  Past Medical History  Diagnosis Date  . Diabetes mellitus   . Hypertension    History reviewed. No pertinent past surgical history. Family History  Problem Relation Age of Onset  . Diabetes Mother   . Hypertension Mother   . Diabetes Sister    History  Substance Use Topics  . Smoking status: Never Smoker   . Smokeless tobacco: Not on file  . Alcohol Use: Yes     Comment: occasional beer    Review of Systems  Musculoskeletal: Positive for arthralgias.  Skin: Negative for wound.    Allergies  Review of patient's allergies indicates no known allergies.  Home Medications   Prior to Admission medications   Medication Sig Start Date End Date Taking? Authorizing Provider  amitriptyline (ELAVIL) 25 MG tablet Take 25 mg by mouth at bedtime.   Yes Historical Provider, MD  ibuprofen (ADVIL,MOTRIN) 200 MG tablet Take 600 mg by mouth 2 (two) times daily as  needed for moderate pain.    Yes Historical Provider, MD  lisinopril (PRINIVIL,ZESTRIL) 20 MG tablet Take 1 tablet (20 mg total) by mouth daily. 06/13/13  Yes Tatyana A Kirichenko, PA-C  metFORMIN (GLUCOPHAGE) 500 MG tablet Take 1 tablet (500 mg total) by mouth 2 (two) times daily with a meal. 06/13/13  Yes Tatyana A Kirichenko, PA-C   BP 120/72  Pulse 101  Temp(Src) 98.2 F (36.8 C) (Oral)  Resp 22  Ht 5\' 11"  (1.803 m)  Wt 279 lb 1.6 oz (126.599 kg)  BMI 38.94 kg/m2  SpO2 95%  Physical Exam  Nursing note and vitals reviewed. Constitutional: He is oriented to person, place, and time. He appears well-developed and well-nourished. No distress.  HENT:  Head: Normocephalic and atraumatic.  Eyes: Conjunctivae and EOM are normal. Pupils are equal, round, and reactive to light. Right eye exhibits no discharge. Left eye exhibits no discharge. No scleral icterus.  Neck: Normal range of motion. Neck supple. No JVD present. No tracheal deviation present.  Cardiovascular: Normal rate, regular rhythm and normal heart sounds.  Exam reveals no gallop and no friction rub.   No murmur heard. Pulmonary/Chest: Effort normal and breath sounds normal. No respiratory distress. He has no wheezes. He has no rales. He exhibits no tenderness.  Abdominal: Soft. He exhibits no distension and no mass. There is no tenderness. There is no rebound and no guarding.  Musculoskeletal:  Normal range of motion. He exhibits no edema and no tenderness.  5/5 ROM and strength throughout  Neurological: He is alert and oriented to person, place, and time.  Skin: Skin is warm and dry.  Psychiatric: He has a normal mood and affect. His behavior is normal. Judgment and thought content normal.   ED Course  Procedures  DIAGNOSTIC STUDIES: Oxygen Saturation is 95% on RA, normal by my interpretation.    COORDINATION OF CARE: 8:56 PM-Discussed treatment plan which includes administer Percocet and advised pt to take Tylenol PRN  while at home. Pt agreed to plan.   Labs Review Labs Reviewed - No data to display  Imaging Review No results found.   EKG Interpretation None     MDM   Final diagnoses:  Joint pain    Patient with RA and chronic pain.  States that his pain has increased due to overuse.  Has an appointment soon, but asks for something for pain in the meantime.  No new falls or concerning MOI.  I personally performed the services described in this documentation, which was scribed in my presence. The recorded information has been reviewed and is accurate.     Montine Circle, PA-C 11/08/13 2211

## 2013-11-08 NOTE — ED Notes (Signed)
Pt complaining of generalized joint pain, knees bilateral, shoulders bilateral, and right wrist and bilateral elbows. Pt works for a living and was moving a lot of weight this weekend. Pt states he has RA and has an appointment soon but unable to bare pain.

## 2013-11-08 NOTE — ED Notes (Signed)
Patient reports appointment with [place] next Wednesday at 1015, but states he "could not hold off on the pain any longer."

## 2013-11-12 NOTE — ED Provider Notes (Signed)
Medical screening examination/treatment/procedure(s) were performed by non-physician practitioner and as supervising physician I was immediately available for consultation/collaboration.   EKG Interpretation None        Mervin Kung, MD 11/12/13 [phone]

## 2014-03-20 ENCOUNTER — Emergency Department (INDEPENDENT_AMBULATORY_CARE_PROVIDER_SITE_OTHER)
Admission: EM | Admit: 2014-03-20 | Discharge: 2014-03-20 | Disposition: A | Discharge status: Home / Self Care | Payer: PRIVATE HEALTH INSURANCE | Source: Home / Self Care

## 2014-03-20 ENCOUNTER — Encounter (HOSPITAL_COMMUNITY): Payer: Self-pay | Admitting: Emergency Medicine

## 2014-03-20 DIAGNOSIS — Z760 Encounter for issue of repeat prescription: Secondary | ICD-10-CM

## 2014-03-20 DIAGNOSIS — M25512 Pain in left shoulder: Secondary | ICD-10-CM

## 2014-03-20 DIAGNOSIS — M25519 Pain in unspecified shoulder: Secondary | ICD-10-CM

## 2014-03-20 DIAGNOSIS — J3089 Other allergic rhinitis: Secondary | ICD-10-CM

## 2014-03-20 HISTORY — DX: Unspecified osteoarthritis, unspecified site: M19.90

## 2014-03-20 LAB — POCT I-STAT, CHEM 8
BUN: 14 mg/dL (ref 6–23)
CALCIUM ION: 1.33 mmol/L — AB (ref 1.12–1.23)
Chloride: 107 mEq/L (ref 96–112)
Creatinine, Ser: 1 mg/dL (ref 0.50–1.35)
Glucose, Bld: 98 mg/dL (ref 70–99)
HCT: 44 % (ref 39.0–52.0)
HEMOGLOBIN: 15 g/dL (ref 13.0–17.0)
Potassium: 3.8 mEq/L (ref 3.7–5.3)
Sodium: 141 mEq/L (ref 137–147)
TCO2: 22 mmol/L (ref 0–100)

## 2014-03-20 MED ORDER — LISINOPRIL 20 MG PO TABS: 20.0000 mg | ORAL_TABLET | Freq: Every day | ORAL | Status: DC

## 2014-03-20 MED ORDER — KETOROLAC TROMETHAMINE 60 MG/2ML IM SOLN
60.0000 mg | Freq: Once | INTRAMUSCULAR | Status: AC
  Administered 2014-03-20: 60 mg via INTRAMUSCULAR

## 2014-03-20 MED ORDER — KETOROLAC TROMETHAMINE 60 MG/2ML IM SOLN
INTRAMUSCULAR | Status: AC
  Filled 2014-03-20: qty 2

## 2014-03-20 MED ORDER — TRAMADOL HCL 50 MG PO TABS: 50.0000 mg | ORAL_TABLET | Freq: Four times a day (QID) | ORAL | Status: DC | PRN

## 2014-03-20 MED ORDER — METFORMIN HCL 500 MG PO TABS: 500.0000 mg | ORAL_TABLET | Freq: Once | ORAL | Status: DC

## 2014-03-20 NOTE — Discharge Instructions (Signed)
You need to find a primary care provider for management of your diabetes, RA and blood pressure.  Follow up with the orthopedist listed for shoulder replacement.

## 2014-03-20 NOTE — ED Provider Notes (Signed)
Medical screening examination/treatment/procedure(s) were performed by non-physician practitioner and as supervising physician I was immediately available for consultation/collaboration.  Philipp Deputy, M.D.   Harden Mo, MD 03/20/14 [phone]

## 2014-03-20 NOTE — ED Provider Notes (Signed)
CSN: [id number]     Arrival date & time 03/20/14  1707 History   None    Chief Complaint  Patient presents with  . Hypertension  . Diabetes   (Consider location/radiation/quality/duration/timing/severity/associated sxs/prior Treatment)  HPI  Patient is a 58 year old male presenting tonight with multiple complaints. Patient's primary complaint is left shoulder pain. Secondary complaint is congestion with itching of nose ears and throat. Third complaint is medication refills for type 2 diabetes and high blood pressure. Fourth complaint is refill of medication for pain management.  Patient states he has insurance but no primary care provider. In addition, the patient states he has been diagnosed with rheumatoid arthritis of the left shoulder was told he needed a shoulder replacement. Patient states last shoulder x-ray was 2 months ago. Patient states he was using his shoulder and arm yesterday at work and aggravated the joint.  Past Medical History  Diagnosis Date  . Diabetes mellitus   . Hypertension   . Arthritis     rheumatoid   History reviewed. No pertinent past surgical history. Family History  Problem Relation Age of Onset  . Diabetes Mother   . Hypertension Mother   . Diabetes Sister    History  Substance Use Topics  . Smoking status: Never Smoker   . Smokeless tobacco: Not on file  . Alcohol Use: Yes     Comment: occasional beer    Review of Systems  Constitutional: Negative.  Negative for fever, chills, diaphoresis and fatigue.  HENT: Positive for congestion and sneezing. Negative for drooling, sinus pressure, sore throat and trouble swallowing.   Eyes: Positive for itching. Negative for photophobia, pain, discharge and redness.  Cardiovascular: Negative.  Negative for chest pain, palpitations and leg swelling.  Gastrointestinal: Negative.  Negative for nausea, vomiting and diarrhea.  Endocrine: Negative.  Negative for polydipsia, polyphagia and polyuria.   Musculoskeletal: Negative for joint swelling, neck pain and neck stiffness.       L shoulder pain.  Skin: Negative.   Allergic/Immunologic: Negative for environmental allergies and food allergies.       Patient states history of diagnosis for rheumatoid arthritis. Denies seeing a rheumatologist for managing this condition.  Neurological: Negative.   Hematological: Negative.   Psychiatric/Behavioral: Negative.     Allergies  Review of patient's allergies indicates no known allergies.  Home Medications   Prior to Admission medications   Medication Sig Start Date End Date Taking? Authorizing Provider  ibuprofen (ADVIL,MOTRIN) 200 MG tablet Take 600 mg by mouth 2 (two) times daily as needed for moderate pain.    Yes Historical Provider, MD  lisinopril (PRINIVIL,ZESTRIL) 20 MG tablet Take 1 tablet (20 mg total) by mouth daily. 11/08/13  Yes Montine Circle, PA-C  metFORMIN (GLUCOPHAGE) 500 MG tablet Take 1 tablet (500 mg total) by mouth 2 (two) times daily with a meal. 11/08/13  Yes Montine Circle, PA-C  amitriptyline (ELAVIL) 25 MG tablet Take 25 mg by mouth at bedtime.    Historical Provider, MD  lisinopril (PRINIVIL,ZESTRIL) 20 MG tablet Take 1 tablet (20 mg total) by mouth daily. 03/20/14   Nehemiah Settle, NP  metFORMIN (GLUCOPHAGE) 500 MG tablet Take 1 tablet (500 mg total) by mouth once. 03/20/14   Nehemiah Settle, NP  oxyCODONE-acetaminophen (PERCOCET/ROXICET) 5-325 MG per tablet Take 1-2 tablets by mouth every 6 (six) hours as needed for severe pain. 11/08/13   Montine Circle, PA-C  traMADol (ULTRAM) 50 MG tablet Take 1 tablet (50 mg total) by mouth every 6 (  six) hours as needed. 03/20/14   Nehemiah Settle, NP   The patient has not taken amitriptyline in over one year.  The patient has not taken his blood pressure or diabetes medication for approximately one month.  BP 134/86  Pulse 92  Temp(Src) 98.7 F (37.1 C) (Oral)  Resp 18  SpO2 97%  Physical Exam  Nursing note and  vitals reviewed. Constitutional: He appears well-developed and well-nourished. No distress.  HENT:  Head: Normocephalic.  Right Ear: External ear normal.  Left Ear: External ear normal.  Bilateral tympanic membranes pearly gray in appearance with light reflexes present and bony prominences visualized.  Bilateral nares patent with mild swelling of nasal turbinates. Posterior oropharynx with no evidence of erythema, exudate or patches. Negative for nuchal rigidity.    Eyes: Pupils are equal, round, and reactive to light.  Neck: Normal range of motion. Neck supple.  Cardiovascular: Normal rate, regular rhythm, normal heart sounds and intact distal pulses.  Exam reveals no gallop and no friction rub.   No murmur heard. Pulmonary/Chest: Effort normal and breath sounds normal. No respiratory distress. He has no wheezes. He has no rales. He exhibits tenderness.  Musculoskeletal: He exhibits tenderness. He exhibits no edema.       Left shoulder: He exhibits decreased range of motion, tenderness, bony tenderness, crepitus and pain. He exhibits no swelling, no effusion, no deformity, no laceration, no spasm, normal pulse and normal strength.  Bilateral upper extremity strength 5 over 5. Color movement sensation intact. Cap refill less than 3 seconds to distal phalanges.  There is no obvious asymmetry or deformity of the left shoulder as compared to the right shoulder. No surface trauma or ecchymosis. Crepitus is present.  There is no bony deformity or prominence of the humeral head. No erythema, warmth, or swelling. Patient has tenderness to palpation of the clavicle, AC joint, and acromion process. No tenderness over the scapula or humeral head.  No tenderness to palpation of the musculature. Pain and limitation noted with active and passive abduction and adduction; internal and external rotation with flexion and extension. Positive drop arm test. Normal sensation over the deltoid and ability to flex elbow.  Distal motor and neurovascular status is intact.  Lymphadenopathy:    He has no cervical adenopathy.  Skin: He is not diaphoretic.    ED Course  Procedures (including critical care time) Labs Review Labs Reviewed  POCT I-STAT, CHEM 8 - Abnormal; Notable for the following:    Calcium, Ion 1.33 (*)    All other components within normal limits  I-STAT CHEM 8, ED   Results for orders placed during the hospital encounter of 03/20/14  POCT I-STAT, CHEM 8      Result Value Ref Range   Sodium 141  137 - 147 mEq/L   Potassium 3.8  3.7 - 5.3 mEq/L   Chloride 107  96 - 112 mEq/L   BUN 14  6 - 23 mg/dL   Creatinine, Ser 1.00  0.50 - 1.35 mg/dL   Glucose, Bld 98  70 - 99 mg/dL   Calcium, Ion 1.33 (*) 1.12 - 1.23 mmol/L   TCO2 22  0 - 100 mmol/L   Hemoglobin 15.0  13.0 - 17.0 g/dL   HCT 44.0  39.0 - 52.0 %   Imaging Review No results found.   MDM   1. Shoulder joint pain, left   2. Encounter for medication refill   3. Other allergic rhinitis    Meds ordered this encounter  Medications  . ketorolac (TORADOL) injection 60 mg    Sig:   . metFORMIN (GLUCOPHAGE) 500 MG tablet    Sig: Take 1 tablet (500 mg total) by mouth once.    Dispense:  30 tablet    Refill:  0    Take with food.  Marland Kitchen lisinopril (PRINIVIL,ZESTRIL) 20 MG tablet    Sig: Take 1 tablet (20 mg total) by mouth daily.    Dispense:  30 tablet    Refill:  1  . traMADol (ULTRAM) 50 MG tablet    Sig: Take 1 tablet (50 mg total) by mouth every 6 (six) hours as needed.    Dispense:  15 tablet    Refill:  0   The patient advised to use over the counter zyrtec and saline irrigation for congestion. Patient started at 500 mg of metformin daily to avoid GI upset. Patient is to find and pick a primary care provider this week for long-term maintenance of medications. Patient was advised he will no longer be given any medication refills in the urgent care setting.  In addition, the patient is to follow up with orthopedist of his  choice for management of chronic shoulder pain. The patient verbalizes understanding and agrees to plan of care.       Nehemiah Settle, NP 03/20/14 1845

## 2014-03-20 NOTE — ED Notes (Signed)
Ran out of his diabetes and BP medication 1 month ago.  No blurred vision.  Has been going to the [facility] frequently.   C/o headache, and dizziness.  C/o throat, ears, nose and eyes are itching and has a stuffy nose.  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

## 2014-04-29 ENCOUNTER — Telehealth: Payer: Self-pay | Admitting: Medical

## 2014-04-29 ENCOUNTER — Ambulatory Visit (INDEPENDENT_AMBULATORY_CARE_PROVIDER_SITE_OTHER): Payer: No Typology Code available for payment source | Admitting: Medical

## 2014-04-29 ENCOUNTER — Encounter: Payer: Self-pay | Admitting: Medical

## 2014-04-29 VITALS — BP 130/80 | HR 82 | Temp 97.3°F | Resp 16 | Wt 282.0 lb

## 2014-04-29 DIAGNOSIS — M2550 Pain in unspecified joint: Secondary | ICD-10-CM

## 2014-04-29 DIAGNOSIS — E119 Type 2 diabetes mellitus without complications: Secondary | ICD-10-CM | POA: Diagnosis not present

## 2014-04-29 DIAGNOSIS — M25569 Pain in unspecified knee: Secondary | ICD-10-CM

## 2014-04-29 DIAGNOSIS — Z23 Encounter for immunization: Secondary | ICD-10-CM

## 2014-04-29 DIAGNOSIS — I10 Essential (primary) hypertension: Secondary | ICD-10-CM

## 2014-04-29 LAB — COMPREHENSIVE METABOLIC PANEL
ALK PHOS: 62 U/L (ref 39–117)
ALT: 8 U/L (ref 0–53)
AST: 17 U/L (ref 0–37)
Albumin: 4.3 g/dL (ref 3.5–5.2)
BILIRUBIN TOTAL: 0.4 mg/dL (ref 0.2–1.2)
BUN: 18 mg/dL (ref 6–23)
CO2: 22 meq/L (ref 19–32)
CREATININE: 1.11 mg/dL (ref 0.50–1.35)
Calcium: 9.4 mg/dL (ref 8.4–10.5)
Chloride: 106 mEq/L (ref 96–112)
Glucose, Bld: 120 mg/dL — ABNORMAL HIGH (ref 70–99)
Potassium: 4.3 mEq/L (ref 3.5–5.3)
Sodium: 138 mEq/L (ref 135–145)
Total Protein: 7 g/dL (ref 6.0–8.3)

## 2014-04-29 LAB — LIPID PANEL
CHOL/HDL RATIO: 2.6 ratio
CHOLESTEROL: 200 mg/dL (ref 0–200)
HDL: 77 mg/dL (ref >39)
LDL Cholesterol: 115 mg/dL — ABNORMAL HIGH (ref 0–99)
Triglycerides: 39 mg/dL (ref <150)
VLDL: 8 mg/dL (ref 0–40)

## 2014-04-29 LAB — URIC ACID: Uric Acid, Serum: 5.7 mg/dL (ref 4.0–7.8)

## 2014-04-29 LAB — POCT GLYCOSYLATED HEMOGLOBIN (HGB A1C): Hemoglobin A1C: 6.3

## 2014-04-29 LAB — TSH: TSH: 2.11 u[IU]/mL (ref 0.350–4.500)

## 2014-04-29 MED ORDER — TRAMADOL HCL 50 MG PO TABS: 50.0000 mg | ORAL_TABLET | Freq: Four times a day (QID) | ORAL | Status: DC | PRN

## 2014-04-29 MED ORDER — METFORMIN HCL 500 MG PO TABS: 500.0000 mg | ORAL_TABLET | Freq: Once | ORAL | Status: DC

## 2014-04-29 MED ORDER — LISINOPRIL 20 MG PO TABS: 20.0000 mg | ORAL_TABLET | Freq: Every day | ORAL | Status: DC

## 2014-04-29 MED ORDER — DICLOFENAC SODIUM 75 MG PO TBEC: 75.0000 mg | DELAYED_RELEASE_TABLET | Freq: Two times a day (BID) | ORAL | Status: DC

## 2014-04-29 NOTE — Telephone Encounter (Signed)
Pt wants to know if he can take anything with the medication you gave him today for arthritis, like tylenol or advil. Please call pt.

## 2014-04-29 NOTE — Telephone Encounter (Signed)
Patient is aware of Matthew Colon [facility] recommendations

## 2014-04-29 NOTE — Progress Notes (Signed)
Subjective:   Matthew Colon is an 58 y.o. male who presents as a new patient today.  currently not seeing other doctors, just been going to urgent care.   He has a hx/o hypertension, diabetes type II, arthritis.  Current medications Lisinopril 20mg  daily and Metformin 500mg  once daily.  Ran out of both medications last night.  Also here for generalized pains in left shoulder, bilat knee pain, and bilat ankle pain, and right wrist pain.  He notes foot pains.  Has seen [facility] in the past.   Last saw them 18mo ago.   Has had pains in joints for a while.  Works physical labor lifting and pulling pallets.  Has steel toe shoes that worsen his pains in the feet.  Moving and walking seems to aggravate the foot pain more.  Using ibuprofen for foot pain.  No prior rheumatology consult, but at prior hospital ED visit 3 years ago and at [facility] was told he had rheumatoid arthritis.  Gets some morning stiffness.   Hit right foot with jack at work a week or so ago, but has had ongoing pains in joints for years.    Diabetes type 2.  Diagnosed 8-9 years ago.  Was on insulin the first year of diagnosis, but since primarily Metformin.  Not checking glucose. Not sure about HgbA1C last. denies numbness or tingling in feet or extremities.  Last eye exam 2 years ago.  Diet - eats some fried foods, some sweets.  Exercise - physical on the job.    HTN - not checking BP.   diagnosed 8-9 years ago.  The following portions of the patient's history were reviewed and updated as appropriate: allergies, current medications, past family history, past medical history, past social history, past surgical history and problem list.  ROS as in subjective above    Objective:   BP 130/80  Pulse 82  Temp(Src) 97.3 F (36.3 C) (Oral)  Resp 16  Wt 282 lb (127.914 kg)  General appearance: alert, no distress, WD/WN, AA male Neck: supple, no lymphadenopathy, no thyromegaly, no masses, no bruits Heart:  RRR, normal S1, S2, no murmurs Lungs: CTA bilaterally, no wheezes, rhonchi, or rales Pulses: 2+ symmetric, upper and lower extremities, normal cap refill Ext: no edema MSK: tender along right foot 5th metatarsal midline and tarsal bone, mild tenderness of medial ligaments of both feet, bilat bunions/mild to moderate, deviation of great toes bilat towards other toes along with toes bilat feet seem somewhat pushed together, right wrist tender, ROM seems mildly decreased of both wrists, otherwise no deformity, no other obvious joint swelling or enlargement, rest of UE and LE unremarkable     Assessment:   Encounter Diagnoses  Name Primary?  . Diabetes type 2, controlled Yes  . Essential hypertension   . Polyarthralgia   . Knee pain, acute, unspecified laterality   . Need for prophylactic vaccination and inoculation against influenza      Plan:   Diabetes type 2 - hgba1C 6.3% today, c/t same medication, daily foot checks, yearly eye exams, discussed healthy diet briefly, labs today.  HTN - c/t same medication, labs today  Polyarthralgia, knee pain, ankle and foot pains, shoulder pain - has had some xrays and recent care this year with ortho, but no long seeing them due to insurance issue. Labs today as a rheumatoid screen, scripts for Diclofenac and Ultram pending labs.    Counseled on the influenza virus vaccine.  Vaccine information sheet given.  Influenza vaccine  given after consent obtained.

## 2014-04-29 NOTE — Telephone Encounter (Signed)
No, just the medications I gave

## 2014-04-30 LAB — MICROALBUMIN / CREATININE URINE RATIO
Creatinine, Urine: 143.3 mg/dL
MICROALB UR: 0.5 mg/dL (ref <2.0)
Microalb Creat Ratio: 3.5 mg/g (ref 0.0–30.0)

## 2014-04-30 LAB — ANA: Anti Nuclear Antibody(ANA): NEGATIVE

## 2014-04-30 LAB — SEDIMENTATION RATE: Sed Rate: 12 mm/hr (ref 0–16)

## 2014-05-01 ENCOUNTER — Other Ambulatory Visit: Payer: Self-pay | Admitting: Medical

## 2014-05-01 LAB — CYCLIC CITRUL PEPTIDE ANTIBODY, IGG

## 2014-05-01 MED ORDER — ROSUVASTATIN CALCIUM 10 MG PO TABS: 10.0000 mg | ORAL_TABLET | Freq: Every day | ORAL | Status: DC

## 2014-05-02 ENCOUNTER — Telehealth: Payer: Self-pay | Admitting: Internal Medicine

## 2014-05-02 MED ORDER — ROSUVASTATIN CALCIUM 10 MG PO TABS: 10.0000 mg | ORAL_TABLET | Freq: Every day | ORAL | Status: DC

## 2014-05-02 NOTE — Telephone Encounter (Signed)
Pt wanted crestor to be sent to [facility] [address] and not [facility] as they were too expensive. Cancelled the rx at [facility]

## 2014-05-06 ENCOUNTER — Telehealth: Payer: Self-pay | Admitting: Medical

## 2014-05-06 NOTE — Telephone Encounter (Signed)
Pt called back & stated he had to leave work early because his feet are hurting so bad.  Asked that you please call him in something for pain, he's having to take 2 Tramadol at a time for any relief.

## 2014-05-06 NOTE — Telephone Encounter (Signed)
Pt called and stated that his feet are still really hurting. He states he had to take two pain meds at a time and now he is out. He is requesting a refill or something stronger o he doesn't have to take so many. Pt can be reached at [phone] and uses [facility] [address].

## 2014-05-07 NOTE — Telephone Encounter (Signed)
Patient states not tingling or numbness. He said just sharp pains in his feet. He said he using the antiinflammatory and he used all of the pain medication. He wanted a refill on the pain  Medication. What do you advise?

## 2014-05-07 NOTE — Telephone Encounter (Signed)
Does he have feet numbness, tingling?  Or just pain?   I may need him to go get foot xrays.   Can use the anti-inflammatory I prescribed as needed, Ultram occasionally as needed, but preferably not daily.  If stays in pain, we need to consider other therapies.

## 2014-05-13 ENCOUNTER — Other Ambulatory Visit: Payer: Self-pay | Admitting: Medical

## 2014-05-13 MED ORDER — TRAMADOL HCL 50 MG PO TABS: 50.0000 mg | ORAL_TABLET | Freq: Four times a day (QID) | ORAL | Status: DC | PRN

## 2014-05-13 MED ORDER — DICLOFENAC SODIUM 75 MG PO TBEC: 75.0000 mg | DELAYED_RELEASE_TABLET | Freq: Two times a day (BID) | ORAL | Status: DC

## 2014-05-13 NOTE — Telephone Encounter (Signed)
Per Audelia Acton- ok to call out tramadol. And for pt to use Voltaren and then follow-up on his foot pain. Called med into pharmacy and then pt will call back and schedule an appt later today to see shane

## 2014-05-20 ENCOUNTER — Ambulatory Visit: Payer: No Typology Code available for payment source | Admitting: Medical

## 2014-05-27 ENCOUNTER — Ambulatory Visit: Payer: No Typology Code available for payment source | Admitting: Medical

## 2014-06-17 ENCOUNTER — Other Ambulatory Visit: Payer: Self-pay | Admitting: Medical

## 2014-06-17 ENCOUNTER — Telehealth: Payer: Self-pay | Admitting: Medical

## 2014-06-17 ENCOUNTER — Ambulatory Visit: Payer: No Typology Code available for payment source | Admitting: Medical

## 2014-06-17 MED ORDER — TRAMADOL HCL 50 MG PO TABS: 50.0000 mg | ORAL_TABLET | Freq: Four times a day (QID) | ORAL | Status: DC | PRN

## 2014-06-17 NOTE — Telephone Encounter (Signed)
Tramadol was called in to [facility]

## 2014-06-17 NOTE — Telephone Encounter (Signed)
rx ready, but make sure he has f/u appt

## 2014-06-17 NOTE — Telephone Encounter (Signed)
Pt couldn't make appt today, he has appt on Monday, this is the soonest he could come in because of his job.  He asked for refill of Tramadol for his foot pain.

## 2014-06-24 ENCOUNTER — Encounter: Payer: Self-pay | Admitting: Medical

## 2014-06-24 ENCOUNTER — Telehealth: Payer: Self-pay | Admitting: Medical

## 2014-06-24 ENCOUNTER — Ambulatory Visit (INDEPENDENT_AMBULATORY_CARE_PROVIDER_SITE_OTHER): Payer: No Typology Code available for payment source | Admitting: Medical

## 2014-06-24 VITALS — BP 110/60 | HR 88 | Temp 98.0°F | Resp 16 | Wt 282.0 lb

## 2014-06-24 DIAGNOSIS — S99929A Unspecified injury of unspecified foot, initial encounter: Secondary | ICD-10-CM

## 2014-06-24 DIAGNOSIS — E119 Type 2 diabetes mellitus without complications: Secondary | ICD-10-CM | POA: Diagnosis not present

## 2014-06-24 DIAGNOSIS — J40 Bronchitis, not specified as acute or chronic: Secondary | ICD-10-CM | POA: Diagnosis not present

## 2014-06-24 DIAGNOSIS — Z87898 Personal history of other specified conditions: Secondary | ICD-10-CM

## 2014-06-24 DIAGNOSIS — M79672 Pain in left foot: Secondary | ICD-10-CM

## 2014-06-24 DIAGNOSIS — M79671 Pain in right foot: Secondary | ICD-10-CM | POA: Diagnosis not present

## 2014-06-24 DIAGNOSIS — Z8739 Personal history of other diseases of the musculoskeletal system and connective tissue: Secondary | ICD-10-CM

## 2014-06-24 DIAGNOSIS — J329 Chronic sinusitis, unspecified: Secondary | ICD-10-CM | POA: Diagnosis not present

## 2014-06-24 DIAGNOSIS — M2010 Hallux valgus (acquired), unspecified foot: Secondary | ICD-10-CM

## 2014-06-24 MED ORDER — AZITHROMYCIN 250 MG PO TABS: ORAL_TABLET | ORAL | Status: DC

## 2014-06-24 MED ORDER — HYDROCODONE-ACETAMINOPHEN 7.5-325 MG PO TABS: ORAL_TABLET | ORAL | Status: DC

## 2014-06-24 NOTE — Progress Notes (Signed)
Subjective: Here for c/o feet pain.  He has been a diabetic for 10 years+, no prior knowledge of diagnosis of diabetic feet neuropathy.   In recent weeks he has had pains in his feet throughout.  Works on concrete all day long, wears stool toe boots.  Get throbbing and sharp pains in both feet on the outsides.  Gets burning pains also of bilateral feet.  Uses 3 tramadol at a time to stop the pain.  Gets sharp pains in both feet on "insides of feet."  The outsides of the feet are sore.  When he gets back on his feet after resting, the pain intensifies.  Gets some numbness in left heel.  No tingling.  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  While pulling the pallet jack at work 6 weeks ago when he had stopped the pallet jack can't moving and bumped into his left lateral foot. Ended up doing the same on the right side a few days later.  The outsides of both feet have been sore since this injury.   He did not go to [employer] as he wasn't sure what was causing the pain since he had these other pains prior to this . However he does have soreness in the outsides of both feet since the palate jack bumped in his feet  He notes 1+ week hx/o cough, sinus pressure, low grade fever, nausea, facial pain, not improving.   ROS as in subjective  Objective: Gen: wd, wn, nad Tender maxillary sinus, TMs pearly, pharynx normal, narea patent Lungs: coarse sounds, but no wheezing, rhonchi, rales Skin: no obvious foot lesions, no erythema, nails somewhat thickened throughout, there is brownish/brawny coloration of bilat feet in stocking pattern Pulses: 1+ dorsal pedis pulse, can't appreciate posterior tibialis pulse, cap refill normal, not much hair of bilat lower extremities, but he says he has never had much hair of either legs or feet Ext: no edema Neuro: monofilament exam normal sensation MSK: bilat great toe deviation and bony changes of MTP bilat first toes consistent with bunions with  some deviation of rest of adjacent toes bilat.  Tender over 4th and 5th metatarsals bilat, somewhat poor arches bilat, feet seem somewhat less flexible and mobile of toes in general, ankle ROM normal, rest of foot nontender and no other deformity    Assessment: Encounter Diagnoses  Name Primary?  Marland Kitchen Foot pain, bilateral Yes  . Bunion of great toe, unspecified laterality   . History of burning pain in lower extremity   . Diabetes type 2, controlled   . Foot injury, unspecified laterality, initial encounter   . Sinobronchitis     Plan: Discussed possible causes of his feet pain which may be multifactorial.   Somewhat poor arches, bunions bilat, he stands for long periods on concrete in steel toe boots, and likely has some diabetic neuropathy of feet.   He has 1+ pulses but I don't necessary suspect claudication.   Referral to podiatry.  In the meantime can use Norco for pain, 2-3 times daily prn.  Stop Ultram.  Sinobronchitis - begin Zpak, increase water intake, can use OTC Mucinex.  F/u if not improving in 3-5 days.

## 2014-06-24 NOTE — Telephone Encounter (Signed)
I fax over his referral to the [facility] [place]. [place], [place] # [phone] Fax # [phone] They will contact the patient for his appointment

## 2014-06-24 NOTE — Telephone Encounter (Signed)
Refer to podiatry for multiple feet issues and diabetic foot care.

## 2014-07-08 ENCOUNTER — Telehealth: Payer: Self-pay | Admitting: Internal Medicine

## 2014-07-08 MED ORDER — DICLOFENAC SODIUM 75 MG PO TBEC: 75.0000 mg | DELAYED_RELEASE_TABLET | Freq: Two times a day (BID) | ORAL | Status: DC

## 2014-07-08 NOTE — Telephone Encounter (Signed)
Pt states he was just now leaving the foot center and they will not see him cause his insurance has not kicked in for the new year so they will not see him and can not see him until his insurance has kicked in. Pt is completely out of meds and needs to get a refill on his hydrocodone-acetaminophen. Pt is on the bus on the way over here to get rx. He wants some until he can get seen there. When he goes into work tomorrow he will find out whats going on with him insurance

## 2014-07-08 NOTE — Telephone Encounter (Signed)
Pt called back and i gave him the message again that no pain meds could be given to him due to him going through it to fast and that we could only send in voltaren and he said ok to send it in to [facility] [address]

## 2014-07-08 NOTE — Telephone Encounter (Signed)
I ask Audelia Acton what  Would be the next medication that he would send to the pharmacy for the patient and Matthew Ogle, PA and Dr. Redmond School MD both stated to call out Voltarnen 75 mg bid # 30. I got back on the phone and i went over this with the patient and he states that he has used this before and it doesn't work or help. He states that id he can't call something in for pain then he doesn't want on anything else. He will deal with the pain until his insurance kicks in.

## 2014-07-08 NOTE — Telephone Encounter (Signed)
I gave him #40 or norco/narcotic pain medication on 06/23/14.   He is going through them too fast and I don't continue chronic pain medications.  Do whatever we need to do to get him into podiatry, but #40 tablets should have been enough to cover for this period of time.   He can substitute or alternate with OTC extra strength Tylenol or Aleve temporarily

## 2014-07-08 NOTE — Telephone Encounter (Signed)
I went over the message from [facility] PA in full detail with the patient. The patient states that he is in pain all the time. He states that he uses the medication when he is at work and while at home. Patient states that he will find out from his job when his new insurance is suppose to start from his job.

## 2014-07-08 NOTE — Telephone Encounter (Signed)
See msg below

## 2014-07-09 ENCOUNTER — Other Ambulatory Visit: Payer: Self-pay | Admitting: Medical

## 2014-07-09 DIAGNOSIS — M79671 Pain in right foot: Secondary | ICD-10-CM

## 2014-07-09 DIAGNOSIS — M79672 Pain in left foot: Principal | ICD-10-CM

## 2014-07-09 MED ORDER — GABAPENTIN 100 MG PO CAPS: 100.0000 mg | ORAL_CAPSULE | Freq: Three times a day (TID) | ORAL | Status: DC

## 2014-07-09 NOTE — Progress Notes (Signed)
Had to call in

## 2014-07-09 NOTE — Telephone Encounter (Signed)
Patient is aware of the message from Dorothea Ogle [facility] and his recommendations. Patient is aware of the medication that was sent to his pharmacy. Patient states that the only way he can get the xrays is by going to the ER. I explain to him that , that is not process the physicians here would like you to do so lets hold off on the xrays for now. I ask to please call me back to let me know when his new insurance will start so we can continue with the next steps to help with his feet concerns.

## 2014-07-09 NOTE — Telephone Encounter (Signed)
Find out what he learned as far as getting into podiatry/insurance coverage?  To speed up the process and give him help, have him go for bilat foot xrays, and begin Gabapentin 100mg  once daily for 3 days, then BID for 3 days, the TID.   This is a neuropathic pain medication that will likely give him much improvement.  In the meantime, have him use daily Aleve as well to give other pain relief as the gabapentin starts to work.

## 2014-07-25 ENCOUNTER — Telehealth: Payer: Self-pay | Admitting: Internal Medicine

## 2014-07-25 NOTE — Telephone Encounter (Signed)
I believe this is in regards to referral we tried to make to podiatry.  If so, please move forward with referral

## 2014-07-25 NOTE — Telephone Encounter (Signed)
Pt called back letting us know what insurance he had that he wanted to let you know  [employer] ID- [id number]

## 2014-07-25 NOTE — Telephone Encounter (Signed)
I spoke with patient and he does not need a podiatry appointment, he said he provided that information for billing purposes.

## 2014-08-02 ENCOUNTER — Ambulatory Visit: Payer: No Typology Code available for payment source | Admitting: Medical

## 2014-08-05 ENCOUNTER — Ambulatory Visit: Payer: No Typology Code available for payment source | Admitting: Medical

## 2014-08-12 ENCOUNTER — Ambulatory Visit: Payer: No Typology Code available for payment source | Admitting: Medical

## 2014-08-13 ENCOUNTER — Encounter: Payer: Self-pay | Admitting: Medical

## 2014-08-18 ENCOUNTER — Encounter (HOSPITAL_COMMUNITY): Payer: Self-pay | Admitting: Emergency Medicine

## 2014-08-18 ENCOUNTER — Emergency Department (HOSPITAL_COMMUNITY)
Admission: EM | Admit: 2014-08-18 | Discharge: 2014-08-18 | Disposition: A | Discharge status: Home / Self Care | Payer: BLUE CROSS/BLUE SHIELD | Attending: Emergency Medicine | Admitting: Emergency Medicine

## 2014-08-18 ENCOUNTER — Emergency Department (HOSPITAL_COMMUNITY): Payer: BLUE CROSS/BLUE SHIELD

## 2014-08-18 DIAGNOSIS — J0100 Acute maxillary sinusitis, unspecified: Secondary | ICD-10-CM | POA: Insufficient documentation

## 2014-08-18 DIAGNOSIS — Y9389 Activity, other specified: Secondary | ICD-10-CM | POA: Diagnosis not present

## 2014-08-18 DIAGNOSIS — Y9289 Other specified places as the place of occurrence of the external cause: Secondary | ICD-10-CM | POA: Insufficient documentation

## 2014-08-18 DIAGNOSIS — E119 Type 2 diabetes mellitus without complications: Secondary | ICD-10-CM | POA: Insufficient documentation

## 2014-08-18 DIAGNOSIS — Y998 Other external cause status: Secondary | ICD-10-CM | POA: Diagnosis not present

## 2014-08-18 DIAGNOSIS — Z79899 Other long term (current) drug therapy: Secondary | ICD-10-CM | POA: Diagnosis not present

## 2014-08-18 DIAGNOSIS — S9031XA Contusion of right foot, initial encounter: Secondary | ICD-10-CM | POA: Diagnosis not present

## 2014-08-18 DIAGNOSIS — I10 Essential (primary) hypertension: Secondary | ICD-10-CM | POA: Diagnosis not present

## 2014-08-18 DIAGNOSIS — W228XXA Striking against or struck by other objects, initial encounter: Secondary | ICD-10-CM | POA: Insufficient documentation

## 2014-08-18 DIAGNOSIS — M069 Rheumatoid arthritis, unspecified: Secondary | ICD-10-CM | POA: Insufficient documentation

## 2014-08-18 DIAGNOSIS — S99922A Unspecified injury of left foot, initial encounter: Secondary | ICD-10-CM | POA: Diagnosis present

## 2014-08-18 DIAGNOSIS — S9032XA Contusion of left foot, initial encounter: Secondary | ICD-10-CM | POA: Diagnosis not present

## 2014-08-18 MED ORDER — HYDROCODONE-ACETAMINOPHEN 5-325 MG PO TABS
2.0000 | ORAL_TABLET | Freq: Once | ORAL | Status: AC
  Administered 2014-08-18: 2 via ORAL
  Filled 2014-08-18: qty 2

## 2014-08-18 MED ORDER — AMOXICILLIN 500 MG PO CAPS: 500.0000 mg | ORAL_CAPSULE | Freq: Three times a day (TID) | ORAL | Status: DC

## 2014-08-18 MED ORDER — HYDROCODONE-ACETAMINOPHEN 5-325 MG PO TABS: 2.0000 | ORAL_TABLET | ORAL | Status: DC | PRN

## 2014-08-18 NOTE — ED Notes (Signed)
Patient transported to X-ray 

## 2014-08-18 NOTE — ED Notes (Signed)
Declined W/C at D/C and was escorted to lobby by RN. 

## 2014-08-18 NOTE — ED Notes (Signed)
Pt c/o hit both his feet with a pilot jack starting 2 weeks ago. Pt reports head congestion since Thursday.

## 2014-08-18 NOTE — Discharge Instructions (Signed)
Contusion A contusion is a deep bruise. Contusions are the result of an injury that caused bleeding under the skin. The contusion may turn blue, purple, or yellow. Minor injuries will give you a painless contusion, but more severe contusions may stay painful and swollen for a few weeks.  CAUSES  A contusion is usually caused by a blow, trauma, or direct force to an area of the body. SYMPTOMS   Swelling and redness of the injured area.  Bruising of the injured area.  Tenderness and soreness of the injured area.  Pain. DIAGNOSIS  The diagnosis can be made by taking a history and physical exam. An X-ray, CT scan, or MRI may be needed to determine if there were any associated injuries, such as fractures. TREATMENT  Specific treatment will depend on what area of the body was injured. In general, the best treatment for a contusion is resting, icing, elevating, and applying cold compresses to the injured area. Over-the-counter medicines may also be recommended for pain control. Ask your caregiver what the best treatment is for your contusion. HOME CARE INSTRUCTIONS   Put ice on the injured area.  Put ice in a plastic bag.  Place a towel between your skin and the bag.  Leave the ice on for 15-20 minutes, 3-4 times a day, or as directed by your health care provider.  Only take over-the-counter or prescription medicines for pain, discomfort, or fever as directed by your caregiver. Your caregiver may recommend avoiding anti-inflammatory medicines (aspirin, ibuprofen, and naproxen) for 48 hours because these medicines may increase bruising.  Rest the injured area.  If possible, elevate the injured area to reduce swelling. SEEK IMMEDIATE MEDICAL CARE IF:   You have increased bruising or swelling.  You have pain that is getting worse.  Your swelling or pain is not relieved with medicines. MAKE SURE YOU:   Understand these instructions.  Will watch your condition.  Will get help right  away if you are not doing well or get worse. Document Released: 03/31/2005 Document Revised: 06/26/2013 Document Reviewed: 04/26/2011 [facility] Patient Information 2015 [place], [place]. This information is not intended to replace advice given to you by your health care provider. Make sure you discuss any questions you have with your health care provider. Sinusitis Sinusitis is redness, soreness, and inflammation of the paranasal sinuses. Paranasal sinuses are air pockets within the bones of your face (beneath the eyes, the middle of the forehead, or above the eyes). In healthy paranasal sinuses, mucus is able to drain out, and air is able to circulate through them by way of your nose. However, when your paranasal sinuses are inflamed, mucus and air can become trapped. This can allow bacteria and other germs to grow and cause infection. Sinusitis can develop quickly and last only a short time (acute) or continue over a long period (chronic). Sinusitis that lasts for more than 12 weeks is considered chronic.  CAUSES  Causes of sinusitis include:  Allergies.  Structural abnormalities, such as displacement of the cartilage that separates your nostrils (deviated septum), which can decrease the air flow through your nose and sinuses and affect sinus drainage.  Functional abnormalities, such as when the small hairs (cilia) that line your sinuses and help remove mucus do not work properly or are not present. SIGNS AND SYMPTOMS  Symptoms of acute and chronic sinusitis are the same. The primary symptoms are pain and pressure around the affected sinuses. Other symptoms include:  Upper toothache.  Earache.  Headache.  Bad  breath.  Decreased sense of smell and taste.  A cough, which worsens when you are lying flat.  Fatigue.  Fever.  Thick drainage from your nose, which often is green and may contain pus (purulent).  Swelling and warmth over the affected sinuses. DIAGNOSIS  Your health care  provider will perform a physical exam. During the exam, your health care provider may:  Look in your nose for signs of abnormal growths in your nostrils (nasal polyps).  Tap over the affected sinus to check for signs of infection.  View the inside of your sinuses (endoscopy) using an imaging device that has a light attached (endoscope). If your health care provider suspects that you have chronic sinusitis, one or more of the following tests may be recommended:  Allergy tests.  Nasal culture. A sample of mucus is taken from your nose, sent to a lab, and screened for bacteria.  Nasal cytology. A sample of mucus is taken from your nose and examined by your health care provider to determine if your sinusitis is related to an allergy. TREATMENT  Most cases of acute sinusitis are related to a viral infection and will resolve on their own within 10 days. Sometimes medicines are prescribed to help relieve symptoms (pain medicine, decongestants, nasal steroid sprays, or saline sprays).  However, for sinusitis related to a bacterial infection, your health care provider will prescribe antibiotic medicines. These are medicines that will help kill the bacteria causing the infection.  Rarely, sinusitis is caused by a fungal infection. In theses cases, your health care provider will prescribe antifungal medicine. For some cases of chronic sinusitis, surgery is needed. Generally, these are cases in which sinusitis recurs more than 3 times per year, despite other treatments. HOME CARE INSTRUCTIONS   Drink plenty of water. Water helps thin the mucus so your sinuses can drain more easily.  Use a humidifier.  Inhale steam 3 to 4 times a day (for example, sit in the bathroom with the shower running).  Apply a warm, moist washcloth to your face 3 to 4 times a day, or as directed by your health care provider.  Use saline nasal sprays to help moisten and clean your sinuses.  Take medicines only as directed by  your health care provider.  If you were prescribed either an antibiotic or antifungal medicine, finish it all even if you start to feel better. SEEK IMMEDIATE MEDICAL CARE IF:  You have increasing pain or severe headaches.  You have nausea, vomiting, or drowsiness.  You have swelling around your face.  You have vision problems.  You have a stiff neck.  You have difficulty breathing. MAKE SURE YOU:   Understand these instructions.  Will watch your condition.  Will get help right away if you are not doing well or get worse. Document Released: 06/21/2005 Document Revised: 11/05/2013 Document Reviewed: 07/06/2011 [facility] Patient Information 2015 [place], [place]. This information is not intended to replace advice given to you by your health care provider. Make sure you discuss any questions you have with your health care provider.

## 2014-08-18 NOTE — ED Provider Notes (Signed)
CSN: [id number]     Arrival date & time 08/18/14  1052 History  This chart was scribed for non-physician practitioner, Fransico Meadow, PA-C,  working with Dorie Rank, MD, by Ian Bushman, ED Scribe. This patient was seen in room TR08C/TR08C and the patient's care was started at 11:22 AM.    Chief Complaint  Patient presents with  . Foot Injury  . Nasal Congestion     (Consider location/radiation/quality/duration/timing/severity/associated sxs/prior Treatment) Patient is a 59 y.o. male presenting with foot injury. The history is provided by the patient. No language interpreter was used.  Foot Injury Associated symptoms: no fever     HPI Comments: Matthew Colon is a 59 y.o. male who presents to the Emergency Department complaining of constant congestion onset three days ago.  Has associated symptoms of a headache, rhinorrhea, and facial pain. Patient is not a smoker and is a diabetic.     Patient also complains of constant lateral foot pain that occurred after using a pilot jack and hitting the side of his feet on it 2 weeks ago.   Patent notes he was wearing steel toed boots.     Past Medical History  Diagnosis Date  . Diabetes mellitus   . Hypertension   . Arthritis     rheumatoid   History reviewed. No pertinent past surgical history. Family History  Problem Relation Age of Onset  . Diabetes Mother   . Hypertension Mother   . Diabetes Sister    History  Substance Use Topics  . Smoking status: Never Smoker   . Smokeless tobacco: Not on file  . Alcohol Use: Yes     Comment: occasional beer    Review of Systems  Constitutional: Negative for fever and chills.  HENT: Positive for congestion and rhinorrhea.   Respiratory: Negative for chest tightness.   Cardiovascular: Negative for chest pain.  Neurological: Positive for headaches.      Allergies  Review of patient's allergies indicates no known allergies.  Home Medications   Prior to Admission  medications   Medication Sig Start Date End Date Taking? Authorizing Provider  gabapentin (NEURONTIN) 100 MG capsule Take 1 capsule (100 mg total) by mouth 3 (three) times daily. 07/09/14   Camelia Eng Tysinger, PA-C  lisinopril (PRINIVIL,ZESTRIL) 20 MG tablet Take 1 tablet (20 mg total) by mouth daily. 04/29/14   Camelia Eng Tysinger, PA-C  metFORMIN (GLUCOPHAGE) 500 MG tablet Take 1 tablet (500 mg total) by mouth once. 04/29/14   Camelia Eng Tysinger, PA-C  rosuvastatin (CRESTOR) 10 MG tablet Take 1 tablet (10 mg total) by mouth daily. 05/02/14   Camelia Eng Tysinger, PA-C   BP 108/73 mmHg  Pulse 74  Temp(Src) 97.6 F (36.4 C) (Oral)  Resp 22  Ht 5\' 11"  (1.803 m)  Wt 277 lb (125.646 kg)  BMI 38.65 kg/m2  SpO2 100% Physical Exam  Constitutional: He is oriented to person, place, and time. He appears well-developed and well-nourished. No distress.  HENT:  Head: Normocephalic and atraumatic.  Nasal congestion  Eyes: Conjunctivae and EOM are normal.  Neck: Neck supple. No tracheal deviation present.  Cardiovascular: Normal rate.   Pulmonary/Chest: Effort normal. No respiratory distress.  Musculoskeletal: Normal range of motion.  Tender bilateral 5th metatarsal heads.    Neurological: He is alert and oriented to person, place, and time.  Skin: Skin is warm and dry.  Psychiatric: He has a normal mood and affect. His behavior is normal.  Nursing note and vitals reviewed.  ED Course  Procedures (including critical care time) DIAGNOSTIC STUDIES: Oxygen Saturation is 100% on RA, normal by my interpretation.    COORDINATION OF CARE: 11:26 AM Discussed treatment plan with patient at beside, the patient agrees with the plan and has no further questions at this time.   Labs Review Labs Reviewed - No data to display  Imaging Review Dg Foot Complete Left  08/18/2014   CLINICAL DATA:  Left foot injury at work. Foot pain. Initial encounter.  EXAM: LEFT FOOT - COMPLETE 3+ VIEW  COMPARISON:  None.   FINDINGS: There is no evidence of fracture or dislocation. There is no evidence of arthropathy. A prominent plantar calcaneal spur is incidentally noted.  IMPRESSION: No acute findings.  Plantar calcaneal bone spur noted.   Electronically Signed   By: Earle Gell M.D.   On: 08/18/2014 12:57   Dg Foot Complete Right  08/18/2014   CLINICAL DATA:  Pt hit both feet with a pallet jack at his job in separate incidents 1 week apart. Pt reports injury and pain on the lateral side of each foot.  EXAM: RIGHT FOOT COMPLETE - 3+ VIEW  COMPARISON:  None.  FINDINGS: Mild hallux valgus deformity. Mild degenerative changes of the first MTP joint. Normal anatomic alignment. No evidence for acute fracture or dislocation. Plantar and posterior calcaneal spurring. Regional soft tissues unremarkable.  IMPRESSION: No acute osseous abnormality.   Electronically Signed   By: Lovey Newcomer M.D.   On: 08/18/2014 12:57     EKG Interpretation None      MDM   Final diagnoses:  Acute maxillary sinusitis, recurrence not specified  Contusion, foot, left, initial encounter  Contusion of right foot, initial encounter     Meds ordered this encounter  Medications  . HYDROcodone-acetaminophen (NORCO/VICODIN) 5-325 MG per tablet 2 tablet    Sig:   . amoxicillin (AMOXIL) 500 MG capsule    Sig: Take 1 capsule (500 mg total) by mouth 3 (three) times daily.    Dispense:  30 capsule    Refill:  0    Order Specific Question:  Supervising Provider    Answer:  Noemi Chapel D [[id number]]  . HYDROcodone-acetaminophen (NORCO/VICODIN) 5-325 MG per tablet    Sig: Take 2 tablets by mouth every 4 (four) hours as needed.    Dispense:  20 tablet    Refill:  0    Order Specific Question:  Supervising Provider    Answer:  Noemi Chapel D [[id number]]   I personally performed the services in this documentation, which was scribed in my presence.  The recorded information has been reviewed and considered.   Ronnald Collum.  Hollace Kinnier [place],  PA-C 08/18/14 [place], MD 08/20/14 [phone]

## 2014-10-07 ENCOUNTER — Ambulatory Visit (INDEPENDENT_AMBULATORY_CARE_PROVIDER_SITE_OTHER): Payer: BLUE CROSS/BLUE SHIELD | Admitting: Medical

## 2014-10-07 ENCOUNTER — Encounter: Payer: Self-pay | Admitting: Medical

## 2014-10-07 VITALS — BP 120/80 | HR 87 | Temp 98.6°F | Resp 15 | Wt 290.0 lb

## 2014-10-07 DIAGNOSIS — R351 Nocturia: Secondary | ICD-10-CM

## 2014-10-07 DIAGNOSIS — R350 Frequency of micturition: Secondary | ICD-10-CM

## 2014-10-07 DIAGNOSIS — E785 Hyperlipidemia, unspecified: Secondary | ICD-10-CM | POA: Diagnosis not present

## 2014-10-07 DIAGNOSIS — E1141 Type 2 diabetes mellitus with diabetic mononeuropathy: Secondary | ICD-10-CM

## 2014-10-07 DIAGNOSIS — I10 Essential (primary) hypertension: Secondary | ICD-10-CM | POA: Diagnosis not present

## 2014-10-07 DIAGNOSIS — E119 Type 2 diabetes mellitus without complications: Secondary | ICD-10-CM

## 2014-10-07 LAB — POCT GLYCOSYLATED HEMOGLOBIN (HGB A1C): Hemoglobin A1C: 6.7

## 2014-10-07 MED ORDER — GABAPENTIN 100 MG PO CAPS: 100.0000 mg | ORAL_CAPSULE | Freq: Two times a day (BID) | ORAL | Status: DC

## 2014-10-07 MED ORDER — METFORMIN HCL 500 MG PO TABS: 500.0000 mg | ORAL_TABLET | Freq: Every day | ORAL | Status: DC

## 2014-10-07 MED ORDER — LISINOPRIL 20 MG PO TABS: 20.0000 mg | ORAL_TABLET | Freq: Every day | ORAL | Status: DC

## 2014-10-07 MED ORDER — TAMSULOSIN HCL 0.4 MG PO CAPS: 0.4000 mg | ORAL_CAPSULE | Freq: Every day | ORAL | Status: DC

## 2014-10-07 MED ORDER — ROSUVASTATIN CALCIUM 10 MG PO TABS: 10.0000 mg | ORAL_TABLET | Freq: Every day | ORAL | Status: DC

## 2014-10-07 MED ORDER — GABAPENTIN 300 MG PO CAPS: 300.0000 mg | ORAL_CAPSULE | Freq: Every day | ORAL | Status: DC

## 2014-10-07 NOTE — Progress Notes (Deleted)
Hasn't been chekcing glucose.    Polyuria, in general, worse at night.   Gabapentin 100 TID,    Increase to 300 gabap QHS

## 2014-10-07 NOTE — Progress Notes (Signed)
  Subjective:   Matthew Colon is an 59 y.o. male who presents for follow up of Type 2 diabetes mellitus, HTN, hyperlipidemia.   Patient is not checking home blood sugars.   Home blood sugar records: patient does not check sugars Current symptoms include: none. Patient denies sometimes pain.  Patient is checking their feet daily. Foot concerns (callous, ulcer, wound, thickened nails, toenail fungus, skin fungus, hammer toe):  Foot doctor said he had a callous Last dilated eye exam 2 to 3 years ago  Current treatments: metformin 500mg  daily Medication compliance: good  Current diet: well balanced Current exercise: none Known diabetic complications: none  Took crestor for 32mo but didn't realize he was suppose to conitnue this  The following portions of the patient's history were reviewed and updated as appropriate: allergies, current medications, past family history, past medical history, past social history, past surgical history and problem list.  ROS as in subjective above    Objective:   Filed Vitals:   10/07/14 1223  BP: 120/80  Pulse: 87  Temp: 98.6 F (37 C)  Resp: 15    General appearance: alert, no distress, WD/WN Neck: supple, no lymphadenopathy, no thyromegaly, no masses Heart: RRR, normal S1, S2, no murmurs Lungs: CTA bilaterally, no wheezes, rhonchi, or rales Abdomen: +bs, soft, non tender, non distended, no masses, no hepatomegaly, no splenomegaly Pulses: 2+ symmetric, upper and lower extremities, normal cap refill Ext: no edema    Assessment:   Encounter Diagnoses  Name Primary?  . Diabetes type 2, controlled Yes  . Essential hypertension   . Urinary frequency   . Nocturia   . Hyperlipidemia   . Diabetic mononeuropathy associated with type 2 diabetes mellitus   . Diabetes mellitus without complication      Plan:   Diabetes Mellitus type 2 - HgbA1C at goal, c/t Metformin 500mg  daily, diabetic diet, work on weight loss, c/t daily foot  checks HTN - c/t Lisinopril 20mg  daily Urinary frequency, nocturia - likely BPH, begin flomax.  UA unremarkable.  Return in 9mo for physical, prostate check hyperlipidemia -noncompliant, restart Crestor 10mg  daily Neuropathy - increase to 300mg  QHS Gabapentin, c/t Gabapentin 100mg  morning and lunch

## 2014-10-08 ENCOUNTER — Encounter: Payer: Self-pay | Admitting: Medical

## 2014-12-19 ENCOUNTER — Ambulatory Visit: Payer: BLUE CROSS/BLUE SHIELD | Admitting: Family Medicine

## 2014-12-19 ENCOUNTER — Telehealth: Payer: Self-pay | Admitting: Medical

## 2014-12-19 NOTE — Telephone Encounter (Signed)
What is he using this med for?  I added/increased Gabapentin last visit to help with neuropathy pain in feet/legs.   If not improved, we can increase Gabapentin.  I can't continue Hydrocodone long term.     Also, when he schedules, he has to give > 24 hours notice of cancellation.  He apparently scheduled yesterday morning with Dr. Tomi Bamberger, no showed, then rescheduled to afternoon then canceled this.     No shows or cancellations <24 hours can lead to problems with [place] being able to see him.

## 2014-12-19 NOTE — Telephone Encounter (Signed)
Pt states didn't have the money for office visit,  Asks if you would refill his Hydrocodone for his shoulder and knee. Please let him know

## 2014-12-20 ENCOUNTER — Other Ambulatory Visit: Payer: Self-pay | Admitting: Medical

## 2014-12-20 MED ORDER — GABAPENTIN 300 MG PO CAPS: 300.0000 mg | ORAL_CAPSULE | Freq: Three times a day (TID) | ORAL | Status: DC

## 2014-12-20 MED ORDER — MELOXICAM 15 MG PO TABS: 15.0000 mg | ORAL_TABLET | Freq: Every day | ORAL | Status: DC

## 2014-12-20 NOTE — Telephone Encounter (Signed)
Patient will sign for records the next OV

## 2014-12-20 NOTE — Telephone Encounter (Signed)
Patient states that he wanted the Hydrocodone for pain in his shoulders and knee. I went over with the patient about not able to refill the Hydrocodone. I also went over the 24 hour cancellation policy. Patient states that he understood everything.  Please, send the new dose of Gabapentin to his pharmacy.

## 2014-12-20 NOTE — Telephone Encounter (Signed)
i sent Gabapentin TID and begin Mobic 15mg  daily for arthritis.  See if we have prior records in paper or e chart from orthopedics.  If not see if he can sign for prior records from ortho.

## 2015-02-16 ENCOUNTER — Other Ambulatory Visit: Payer: Self-pay | Admitting: Medical

## 2015-02-25 ENCOUNTER — Telehealth: Payer: Self-pay | Admitting: Medical

## 2015-02-25 ENCOUNTER — Encounter: Payer: Self-pay | Admitting: Medical

## 2015-02-25 ENCOUNTER — Ambulatory Visit (INDEPENDENT_AMBULATORY_CARE_PROVIDER_SITE_OTHER): Payer: BLUE CROSS/BLUE SHIELD | Admitting: Medical

## 2015-02-25 ENCOUNTER — Other Ambulatory Visit: Payer: Self-pay | Admitting: Medical

## 2015-02-25 VITALS — BP 128/88 | HR 86 | Resp 12 | Ht 71.0 in | Wt 292.0 lb

## 2015-02-25 DIAGNOSIS — M25512 Pain in left shoulder: Secondary | ICD-10-CM | POA: Diagnosis not present

## 2015-02-25 MED ORDER — NAPROXEN 500 MG PO TABS: 500.0000 mg | ORAL_TABLET | Freq: Two times a day (BID) | ORAL | Status: DC

## 2015-02-25 MED ORDER — TRAMADOL HCL 50 MG PO TABS: 50.0000 mg | ORAL_TABLET | Freq: Three times a day (TID) | ORAL | Status: DC | PRN

## 2015-02-25 MED ORDER — METFORMIN HCL 500 MG PO TABS: 500.0000 mg | ORAL_TABLET | Freq: Every day | ORAL | Status: DC

## 2015-02-25 MED ORDER — HYDROCODONE-ACETAMINOPHEN 5-325 MG PO TABS: 1.0000 | ORAL_TABLET | ORAL | Status: DC | PRN

## 2015-02-25 MED ORDER — TAMSULOSIN HCL 0.4 MG PO CAPS: 0.4000 mg | ORAL_CAPSULE | Freq: Every day | ORAL | Status: DC

## 2015-02-25 NOTE — Telephone Encounter (Signed)
Have him stop the mobic temporarily while on the naprosyn

## 2015-02-25 NOTE — Telephone Encounter (Signed)
Please refer to Matthew Colon for ongoing left shoulder problems.

## 2015-02-25 NOTE — Patient Instructions (Signed)
Encounter Diagnosis  Name Primary?  . Left shoulder pain Yes   Recommendations:  Begin Naprosyn twice daily  Use Ultram 1 tablet every 6 hours as needed for pain  Begin using an arm sling OTC, for 1-2 hours at a time  Ice the shoulder 3 times daily for 15-20 minutes  Follow up with [facility]

## 2015-02-25 NOTE — Telephone Encounter (Signed)
I have faxed referral to [place].

## 2015-02-25 NOTE — Telephone Encounter (Signed)
Pt.notified

## 2015-02-25 NOTE — Progress Notes (Signed)
   Subjective: Here for left shoulder pain.  Has hx/o chronic left shoulder problems.  Has seen ortho in the past, says surgery was recommended for replacement if he is to continue working hard with the left shoulder with lots of lifting.   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   He denies specific injury, just consistent pain and worsening pain the more he worked.   He was doing a lot of lifting and pulling suitcases.    Now shoulder is in bad pain, can't really function with the shoulder, and boss told him he would have to have a note to return from a doctor.   No swelling, no paresthesia, just pain and limited activity with left shoulder.  No other aggravating or relieving factors. No other complaint.  ROS as in subjective  Objective: BP 128/88 mmHg  Pulse 86  Resp 12  Ht 5\' 11"  (1.803 m)  Wt 292 lb (132.45 kg)  BMI 40.74 kg/m2  SpO2 99%  Gen: wd, wn, nad Skin: unremarkable Neck: supple, nontender, normal ROM MSK: tender of left shoulder throughout, he is very guarded with the shoulder, so not able to examine much other than exam revealing pain with palpation, ROM, and ROM very limited.  Tender over left upper arm as well, ROM only about 40% of normal in any direction.  Rest of bilat arm exam unremarkable Pulses normal of UE   Assessment: Encounter Diagnosis  Name Primary?  . Left shoulder pain Yes    Plan: discussed symptoms, exam, concerns.   script today for pain medication naprosyn, advised ice, rest, arm sling.  Gave work restrictions.  F/u 1 wk, and will need follow up with orthopedics as I suspect he will need further eval and management.  Of note, he has been known to call in requested pain medication, so it may be best to get orthopedist involvement to give more definitive treatment plan and work recommendations going forward.

## 2015-02-25 NOTE — Telephone Encounter (Signed)
See previous note re: pt medication refill request  Scheduled patient for CPE/ fasting labs and prostate check ( per your instructions from 10/2014 visit)

## 2015-02-25 NOTE — Telephone Encounter (Signed)
Patient states he needs refills on metformin, tamsulosin and meloxicam  [employer]

## 2015-03-04 ENCOUNTER — Ambulatory Visit: Payer: BLUE CROSS/BLUE SHIELD | Admitting: Medical

## 2015-03-07 ENCOUNTER — Encounter: Payer: Self-pay | Admitting: Medical

## 2015-03-14 ENCOUNTER — Encounter: Payer: Self-pay | Admitting: Medical

## 2015-03-14 ENCOUNTER — Ambulatory Visit (INDEPENDENT_AMBULATORY_CARE_PROVIDER_SITE_OTHER): Payer: BLUE CROSS/BLUE SHIELD | Admitting: Medical

## 2015-03-14 VITALS — BP 128/78 | HR 74 | Temp 97.8°F | Resp 18 | Ht 71.0 in | Wt 290.4 lb

## 2015-03-14 DIAGNOSIS — I10 Essential (primary) hypertension: Secondary | ICD-10-CM | POA: Diagnosis not present

## 2015-03-14 DIAGNOSIS — R350 Frequency of micturition: Secondary | ICD-10-CM | POA: Diagnosis not present

## 2015-03-14 DIAGNOSIS — Z23 Encounter for immunization: Secondary | ICD-10-CM | POA: Insufficient documentation

## 2015-03-14 DIAGNOSIS — R110 Nausea: Secondary | ICD-10-CM | POA: Diagnosis not present

## 2015-03-14 DIAGNOSIS — K219 Gastro-esophageal reflux disease without esophagitis: Secondary | ICD-10-CM

## 2015-03-14 DIAGNOSIS — Z1211 Encounter for screening for malignant neoplasm of colon: Secondary | ICD-10-CM | POA: Insufficient documentation

## 2015-03-14 DIAGNOSIS — E785 Hyperlipidemia, unspecified: Secondary | ICD-10-CM | POA: Insufficient documentation

## 2015-03-14 DIAGNOSIS — M79671 Pain in right foot: Secondary | ICD-10-CM

## 2015-03-14 DIAGNOSIS — M79672 Pain in left foot: Secondary | ICD-10-CM

## 2015-03-14 DIAGNOSIS — E1141 Type 2 diabetes mellitus with diabetic mononeuropathy: Secondary | ICD-10-CM

## 2015-03-14 DIAGNOSIS — Z125 Encounter for screening for malignant neoplasm of prostate: Secondary | ICD-10-CM | POA: Diagnosis not present

## 2015-03-14 DIAGNOSIS — R351 Nocturia: Secondary | ICD-10-CM

## 2015-03-14 DIAGNOSIS — Z0000 Encounter for general adult medical examination without abnormal findings: Secondary | ICD-10-CM | POA: Diagnosis not present

## 2015-03-14 DIAGNOSIS — E1159 Type 2 diabetes mellitus with other circulatory complications: Secondary | ICD-10-CM | POA: Insufficient documentation

## 2015-03-14 LAB — HEMOGLOBIN A1C
Hgb A1c MFr Bld: 6.8 % — ABNORMAL HIGH (ref <5.7)
MEAN PLASMA GLUCOSE: 148 mg/dL — AB (ref <117)

## 2015-03-14 LAB — COMPREHENSIVE METABOLIC PANEL
ALT: 8 U/L — ABNORMAL LOW (ref 9–46)
AST: 16 U/L (ref 10–35)
Albumin: 3.7 g/dL (ref 3.6–5.1)
Alkaline Phosphatase: 68 U/L (ref 40–115)
BUN: 17 mg/dL (ref 7–25)
CHLORIDE: 106 mmol/L (ref 98–110)
CO2: 24 mmol/L (ref 20–31)
Calcium: 9.5 mg/dL (ref 8.6–10.3)
Creat: 1.19 mg/dL (ref 0.70–1.33)
GLUCOSE: 115 mg/dL — AB (ref 65–99)
POTASSIUM: 4.9 mmol/L (ref 3.5–5.3)
Sodium: 143 mmol/L (ref 135–146)
TOTAL PROTEIN: 6.6 g/dL (ref 6.1–8.1)
Total Bilirubin: 0.5 mg/dL (ref 0.2–1.2)

## 2015-03-14 LAB — LIPID PANEL
CHOL/HDL RATIO: 2.8 ratio (ref <=5.0)
CHOLESTEROL: 188 mg/dL (ref 125–200)
HDL: 68 mg/dL (ref >=40)
LDL Cholesterol: 103 mg/dL (ref <130)
Triglycerides: 83 mg/dL (ref <150)
VLDL: 17 mg/dL (ref <30)

## 2015-03-14 LAB — CBC
HEMATOCRIT: 40.1 % (ref 39.0–52.0)
HEMOGLOBIN: 13.5 g/dL (ref 13.0–17.0)
MCH: 28.8 pg (ref 26.0–34.0)
MCHC: 33.7 g/dL (ref 30.0–36.0)
MCV: 85.5 fL (ref 78.0–100.0)
MPV: 8.8 fL (ref 8.6–12.4)
Platelets: 326 10*3/uL (ref 150–400)
RBC: 4.69 MIL/uL (ref 4.22–5.81)
RDW: 15.3 % (ref 11.5–15.5)
WBC: 12.6 10*3/uL — AB (ref 4.0–10.5)

## 2015-03-14 LAB — POCT URINALYSIS DIPSTICK
Bilirubin, UA: NEGATIVE
Glucose, UA: NEGATIVE
KETONES UA: NEGATIVE
Leukocytes, UA: NEGATIVE
Nitrite, UA: NEGATIVE
PH UA: 5.5
PROTEIN UA: 0.15
RBC UA: NEGATIVE
Urobilinogen, UA: NEGATIVE

## 2015-03-14 MED ORDER — HYDROCODONE-ACETAMINOPHEN 5-325 MG PO TABS: 1.0000 | ORAL_TABLET | ORAL | Status: DC | PRN

## 2015-03-14 MED ORDER — LISINOPRIL 20 MG PO TABS: 20.0000 mg | ORAL_TABLET | Freq: Every day | ORAL | Status: DC

## 2015-03-14 MED ORDER — METFORMIN HCL 500 MG PO TABS: 500.0000 mg | ORAL_TABLET | Freq: Every day | ORAL | Status: DC

## 2015-03-14 MED ORDER — GABAPENTIN 300 MG PO CAPS: 300.0000 mg | ORAL_CAPSULE | Freq: Three times a day (TID) | ORAL | Status: DC

## 2015-03-14 NOTE — Progress Notes (Signed)
Subjective:   HPI  Matthew Colon is a 59 y.o. male who presents for a complete physical.  Medical care team includes: Crisoforo Oxford, PA-C here for primary care   Preventative care: Last ophthalmology visit:past due Last dental visit:past due Last colonoscopy: never Last prostate exam: today Last EKG:? Last labs:?  Prior vaccinations: TD or Tdap:unsure Influenza: wants today Pneumococcal: ?  Concerns: Muscle cramping  Reviewed their medical, surgical, family, social, medication, and allergy history and updated chart as appropriate.  Past Medical History  Diagnosis Date  . Hypertension   . Type 2 diabetes mellitus with diabetic neuropathy   . Hyperlipidemia   . Wears glasses   . Polyarthralgia     shoulders, knees, ankles, wrists; neg rheum lab screen 04/2014  . Foot pain, bilateral     referred to podiatry 06/2014    Past Surgical History  Procedure Laterality Date  . Colonoscopy      Social History   Social History  . Marital Status: Divorced    Spouse Name: N/A  . Number of Children: N/A  . Years of Education: N/A   Occupational History  . Not on file.   Social History Main Topics  . Smoking status: Never Smoker   . Smokeless tobacco: Not on file  . Alcohol Use: Yes     Comment: occasional beer  . Drug Use: No  . Sexual Activity: Not on file   Other Topics Concern  . Not on file   Social History Narrative    Family History  Problem Relation Age of Onset  . Diabetes Mother   . Hypertension Mother   . Diabetes Sister      Current outpatient prescriptions:  .  gabapentin (NEURONTIN) 300 MG capsule, Take 1 capsule (300 mg total) by mouth 3 (three) times daily., Disp: 90 capsule, Rfl: 1 .  lisinopril (PRINIVIL,ZESTRIL) 20 MG tablet, Take 1 tablet (20 mg total) by mouth daily., Disp: 90 tablet, Rfl: 1 .  metFORMIN (GLUCOPHAGE) 500 MG tablet, Take 1 tablet (500 mg total) by mouth daily with breakfast., Disp: 90 tablet, Rfl: 0 .   rosuvastatin (CRESTOR) 10 MG tablet, Take 1 tablet (10 mg total) by mouth daily., Disp: 90 tablet, Rfl: 0  No Known Allergies   Review of Systems Constitutional: -fever, -chills, -sweats, -unexpected weight change, -decreased appetite, -fatigue Allergy: -sneezing, -itching, -congestion Dermatology: -changing moles, --rash, -lumps ENT: -runny nose, -ear pain, -sore throat, -hoarseness, -sinus pain, -teeth pain, - ringing in ears, -hearing loss, -nosebleeds Cardiology: -chest pain, -palpitations, -swelling, -difficulty breathing when lying flat, -waking up short of breath Respiratory: -cough, -shortness of breath, -difficulty breathing with exercise or exertion, -wheezing, -coughing up blood Gastroenterology: -abdominal pain, -nausea, -vomiting, -diarrhea, -constipation, -blood in stool, -changes in bowel movement, -difficulty swallowing or eating Hematology: -bleeding, -bruising  Musculoskeletal: +joint aches, -muscle aches, -joint swelling, +back pain, -neck pain, +cramping, -changes in gait Ophthalmology: denies vision changes, eye redness, itching, discharge Urology: -burning with urination, -difficulty urinating, -blood in urine, +urinary frequency, -urgency, -incontinence Neurology: -headache, -weakness, -tingling, -numbness, -memory loss, -falls, -dizziness Psychology: -depressed mood, -agitation, -sleep problems     Objective:   Physical Exam  BP 128/78 mmHg  Pulse 74  Temp(Src) 97.8 F (36.6 C) (Oral)  Resp 18  Ht 5\' 11"  (1.803 m)  Wt 290 lb 6.4 oz (131.725 kg)  BMI 40.52 kg/m2  General appearance: alert, no distress, WD/WN, morbidly obese AA male Skin: left forearm triangular 35mm brown lesion somewhat rough, flat otherwise,  similar scattered smaller lesions of right forearm, and several of bi lat legs upper and lower, oval 1.5 cm brown lesion of low back midline unchanged per patient, no other specific worrisome lesions HEENT: normocephalic, conjunctiva/corneas normal,  sclerae anicteric, PERRLA, EOMi, nares patent, no discharge or erythema, pharynx normal Oral cavity: MMM, tongue normal, teeth in good repair Neck: supple, no lymphadenopathy, no thyromegaly, no masses, normal ROM, no bruits Chest: non tender, normal shape and expansion Heart: RRR, normal S1, S2, no murmurs Lungs: CTA bilaterally, no wheezes, rhonchi, or rales Abdomen: +bs, soft, non tender, non distended, no masses, no hepatomegaly, no splenomegaly, no bruits Back: non tender, normal ROM, no scoliosis Musculoskeletal: no obvious deformity, no frank tenderness, no swollen joints,  Extremities: no edema, no cyanosis, no clubbing Pulses: 2+ symmetric, upper and lower extremities, normal cap refill Neurological: alert, oriented x 3, CN2-12 intact, strength normal upper extremities and lower extremities, decreased sensation of bilat soles and toes on monofilament, otherwise WNL, DTRs 1+ throughout, no cerebellar signs, gait normal Psychiatric: normal affect, behavior normal, pleasant  GU: normal male external genitalia, circumcised, nontender, no masses, no hernia, no lymphadenopathy Rectal: anus normal tone, prostate mildly enlarged, no obvious nodules, but unable to examine entire prostate given body habitus    Adult ECG Report  Indication: physical  Rate: 75 bpm  Rhythm: normal sinus rhythm  QRS Axis: 54 degrees  PR Interval: 171ms  QRS Duration: 27ms  QTc: 488ms  Conduction Disturbances: none  Other Abnormalities: none  Patient's cardiac risk factors are: advanced age (older than 24 for men, 60 for women), diabetes mellitus, dyslipidemia, hypertension, male gender and obesity (BMI >= 30 kg/m2).  EKG comparison: none  Narrative Interpretation: normal EKG     Assessment and Plan :    Encounter Diagnoses  Name Primary?  . Encounter for health maintenance examination in adult Yes  . Screening for prostate cancer   . Special screening for malignant neoplasms, colon   . Essential  hypertension   . Type 2 diabetes mellitus with diabetic mononeuropathy   . Hyperlipidemia   . Need for prophylactic vaccination and inoculation against influenza   . Need for Tdap vaccination   . Need for prophylactic vaccination against Streptococcus pneumoniae (pneumococcus)   . Nocturia   . Urinary frequency   . Foot pain, bilateral     Physical exam - discussed healthy lifestyle, diet, exercise, preventative care, vaccinations, and addressed their concerns.   Routine labs today PSA and prostate screening today.    referral for first colonoscopy and likely EGD given hx/o chronic GERD  Counseled on the influenza virus vaccine.  Vaccine information sheet given.  Influenza vaccine given after consent obtained. Counseled on the Tdap (tetanus, diptheria, and acellular pertussis) vaccine.  Vaccine information sheet given. Tdap vaccine given after consent obtained. Counseled on the pneumococcal vaccine.  Vaccine information sheet given.  Pneumococcal vaccine PPSV23 given after consent obtained.  Hypertension - c/t current medication  Hyperlipidemia - c/t current medication Diabetes type 2 - c/t current medications, healthy diet, need to work on weight loss Neuropathy - c/t gabapentin Urinary frequency, nocturia - consider medication changes pending labs  Follow-up pending labs

## 2015-03-14 NOTE — Patient Instructions (Addendum)
  Thank you for giving me the opportunity to serve you today.    Your diagnosis today includes: Encounter Diagnoses  Name Primary?  . Encounter for health maintenance examination in adult Yes  . Screening for prostate cancer   . Special screening for malignant neoplasms, colon   . Essential hypertension   . Type 2 diabetes mellitus with diabetic mononeuropathy   . Hyperlipidemia   . Need for prophylactic vaccination and inoculation against influenza   . Need for Tdap vaccination   . Need for prophylactic vaccination against Streptococcus pneumoniae (pneumococcus)   . Nocturia   . Urinary frequency   . Foot pain, bilateral     Recommendations: See your eye doctor yearly for routine vision care. See your dentist yearly for routine dental care including hygiene visits twice yearly. We updated your influenza vaccine today We updated your Tdap vaccine today.  This is good for 10 years We updated your pneumococcal vaccine today We are screening for prostate cancer today We are referring you for your first colonoscopy We will call with labs

## 2015-03-15 LAB — MICROALBUMIN / CREATININE URINE RATIO
CREATININE, URINE: 250.9 mg/dL
MICROALB UR: 0.7 mg/dL (ref <2.0)
MICROALB/CREAT RATIO: 2.8 mg/g (ref 0.0–30.0)

## 2015-03-15 LAB — PSA: PSA: 8.85 ng/mL — ABNORMAL HIGH (ref <=4.00)

## 2015-03-17 ENCOUNTER — Other Ambulatory Visit: Payer: Self-pay | Admitting: Medical

## 2015-03-17 MED ORDER — ROSUVASTATIN CALCIUM 10 MG PO TABS: 10.0000 mg | ORAL_TABLET | Freq: Every day | ORAL | Status: DC

## 2015-03-17 MED ORDER — CIPROFLOXACIN HCL 500 MG PO TABS: 500.0000 mg | ORAL_TABLET | Freq: Two times a day (BID) | ORAL | Status: DC

## 2015-06-25 ENCOUNTER — Other Ambulatory Visit: Payer: Self-pay | Admitting: Sports Medicine

## 2015-06-25 DIAGNOSIS — M25562 Pain in left knee: Secondary | ICD-10-CM

## 2015-09-01 ENCOUNTER — Other Ambulatory Visit: Payer: Self-pay | Admitting: Medical

## 2015-09-01 NOTE — Telephone Encounter (Signed)
Dr Redmond School is it ok for refill

## 2015-09-01 NOTE — Telephone Encounter (Signed)
This one is yours

## 2015-10-17 ENCOUNTER — Other Ambulatory Visit: Payer: Self-pay | Admitting: Medical

## 2015-10-20 NOTE — Telephone Encounter (Signed)
Rcvd fax from pharmacy requesting that the active Gabapentin 300 mg script be changed to #30

## 2015-10-20 NOTE — Telephone Encounter (Signed)
Refill but get him back in for med check and prostate concern from last visit.

## 2015-10-20 NOTE — Telephone Encounter (Signed)
Pt is scheduled next tuesday

## 2015-10-20 NOTE — Telephone Encounter (Signed)
Is this ok to refill?  

## 2015-10-28 ENCOUNTER — Encounter: Payer: Self-pay | Admitting: Medical

## 2015-11-15 ENCOUNTER — Encounter (HOSPITAL_COMMUNITY): Payer: Self-pay | Admitting: Emergency Medicine

## 2015-11-15 ENCOUNTER — Emergency Department (HOSPITAL_COMMUNITY)
Admission: EM | Admit: 2015-11-15 | Discharge: 2015-11-15 | Disposition: A | Discharge status: Home / Self Care | Payer: BLUE CROSS/BLUE SHIELD | Attending: Emergency Medicine | Admitting: Emergency Medicine

## 2015-11-15 DIAGNOSIS — M25571 Pain in right ankle and joints of right foot: Secondary | ICD-10-CM | POA: Diagnosis not present

## 2015-11-15 DIAGNOSIS — M25512 Pain in left shoulder: Secondary | ICD-10-CM

## 2015-11-15 DIAGNOSIS — E785 Hyperlipidemia, unspecified: Secondary | ICD-10-CM | POA: Insufficient documentation

## 2015-11-15 DIAGNOSIS — Z792 Long term (current) use of antibiotics: Secondary | ICD-10-CM | POA: Insufficient documentation

## 2015-11-15 DIAGNOSIS — I10 Essential (primary) hypertension: Secondary | ICD-10-CM | POA: Diagnosis not present

## 2015-11-15 DIAGNOSIS — M7918 Myalgia, other site: Secondary | ICD-10-CM

## 2015-11-15 DIAGNOSIS — E119 Type 2 diabetes mellitus without complications: Secondary | ICD-10-CM | POA: Diagnosis not present

## 2015-11-15 DIAGNOSIS — M25572 Pain in left ankle and joints of left foot: Secondary | ICD-10-CM

## 2015-11-15 DIAGNOSIS — Z79899 Other long term (current) drug therapy: Secondary | ICD-10-CM | POA: Insufficient documentation

## 2015-11-15 MED ORDER — TRAMADOL HCL 50 MG PO TABS: 50.0000 mg | ORAL_TABLET | Freq: Four times a day (QID) | ORAL | Status: DC | PRN

## 2015-11-15 MED ORDER — NAPROXEN 250 MG PO TABS
500.0000 mg | ORAL_TABLET | Freq: Once | ORAL | Status: AC
  Administered 2015-11-15: 500 mg via ORAL
  Filled 2015-11-15: qty 2

## 2015-11-15 MED ORDER — ACETAMINOPHEN 325 MG PO TABS
650.0000 mg | ORAL_TABLET | Freq: Once | ORAL | Status: AC
  Administered 2015-11-15: 650 mg via ORAL
  Filled 2015-11-15: qty 2

## 2015-11-15 NOTE — ED Provider Notes (Signed)
CSN: [id number]     Arrival date & time 11/15/15  [id number] History   First MD Initiated Contact with Patient 11/15/15 [phone]     Chief Complaint  Patient presents with  . Ankle Pain  . Shoulder Pain   (Consider location/radiation/quality/duration/timing/severity/associated sxs/prior Treatment) HPI 60 y.o. male with a hx of HTN, DM2, presents to the Emergency Department today complaining of chronic bilateral ankle pain as well as left shoulder pain. Pt states that he has been seen previously at [facility] as well as [facility] for similar problems. Notes receiving cortisone injections of his left shoulder 6 months ago as well as injections in bilateral ankles. 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 Notes pain is 8/10 and aching sensation. No relief with Ibuprofen. No CP/SOB/ABD pain. No N/V/D. No fevers. No other symptoms noted.   Past Medical History  Diagnosis Date  . Hypertension   . Type 2 diabetes mellitus with diabetic neuropathy ([place])   . Hyperlipidemia   . Wears glasses   . Polyarthralgia     shoulders, knees, ankles, wrists; neg rheum lab screen 04/2014  . Foot pain, bilateral     referred to podiatry 06/2014  . Allergy   . GERD (gastroesophageal reflux disease)     long history of   Past Surgical History  Procedure Laterality Date  . Colonoscopy      referral pending 03/2015  . No past surgeries      as of 03/2015   Family History  Problem Relation Age of Onset  . Diabetes Mother   . Hypertension Mother   . Diabetes Sister   . Arthritis Sister   . Heart disease Sister   . Other Father     murdered  . Cancer Neg Hx   . Stroke Neg Hx   . Diabetes Sister   . Diabetes Sister    Social History  Substance Use Topics  . Smoking status: Never Smoker   . Smokeless tobacco: None  . Alcohol Use: Yes     Comment: occasional beer    Review of Systems  Constitutional: Negative for  fever.  Respiratory: Negative for shortness of breath.   Cardiovascular: Negative for chest pain.  Musculoskeletal: Positive for myalgias and arthralgias. Negative for joint swelling.   Allergies  Review of patient's allergies indicates no known allergies.  Home Medications   Prior to Admission medications   Medication Sig Start Date End Date Taking? Authorizing Provider  ciprofloxacin (CIPRO) 500 MG tablet Take 1 tablet (500 mg total) by mouth 2 (two) times daily. 03/17/15   Camelia Eng Tysinger, PA-C  gabapentin (NEURONTIN) 300 MG capsule Take 1 capsule (300 mg total) by mouth 3 (three) times daily. 03/14/15   Camelia Eng Tysinger, PA-C  gabapentin (NEURONTIN) 300 MG capsule TAKE ONE CAPSULE BY MOUTH AT BEDTIME 10/20/15   Camelia Eng Tysinger, PA-C  HYDROcodone-acetaminophen (NORCO/VICODIN) 5-325 MG per tablet Take 1 tablet by mouth every 4 (four) hours as needed. 03/14/15   Camelia Eng Tysinger, PA-C  lisinopril (PRINIVIL,ZESTRIL) 20 MG tablet Take 1 tablet (20 mg total) by mouth daily. 03/14/15   Camelia Eng Tysinger, PA-C  metFORMIN (GLUCOPHAGE) 500 MG tablet Take 1 tablet (500 mg total) by mouth daily with breakfast. 03/14/15   Camelia Eng Tysinger, PA-C  rosuvastatin (CRESTOR) 10 MG tablet Take 1 tablet (10 mg total) by mouth daily. 03/17/15   Carlena Hurl, PA-C  BP 144/73 mmHg  Pulse 90  Temp(Src) 97.5 F (36.4 C) (Oral)  Resp 16  Ht 5\' 11"  (1.803 m)  Wt 131.09 kg  BMI 40.33 kg/m2  SpO2 99% Physical Exam  Constitutional: He is oriented to person, place, and time. He appears well-developed and well-nourished.  HENT:  Head: Normocephalic and atraumatic.  Eyes: EOM are normal.  Neck: Normal range of motion.  Cardiovascular: Normal rate, regular rhythm, normal heart sounds and intact distal pulses.   No murmur heard. Pulmonary/Chest: Effort normal and breath sounds normal. No respiratory distress. He has no wheezes. He has no rales. He exhibits no tenderness.  Abdominal: Soft.  Musculoskeletal: Normal  range of motion.  1) TTP over shoulder left shoulder. Pain with flexion/extension/abduction/adduction internal and external rotation. No obvious bony deformity. Neurovascularly intact. Crepitus noted on ROM. 2) Bilateral ankles with no significant swelling noted. Neurovascularly intact. ROM intact. Distal pulses intact. No erythema or signs of infection.    Neurological: He is alert and oriented to person, place, and time.  Skin: Skin is warm and dry.  Psychiatric: He has a normal mood and affect. His behavior is normal. Thought content normal.  Nursing note and vitals reviewed.  ED Course  Procedures (including critical care time) Labs Review Labs Reviewed - No data to display  Imaging Review No results found. I have personally reviewed and evaluated these images and lab results as part of my medical decision-making.   EKG Interpretation None      MDM  I have reviewed and evaluated the relevant laboratory values I have reviewed and evaluated the relevant imaging studies.  I have reviewed the relevant previous healthcare records. I obtained HPI from historian.  ED Course:  Assessment: Pt is a 59yM with hx Dm, HTn who presents with chronic bilateral ankle pain and left shoulder pain. Has been seen previously with orthopedics for chronic issue. Has received steroid injections in the past. No new trauma. On exam, pt in NAD. Nontoxic/nonseptic appearing. VSS. Afebrile. Lungs CTA. Heart RRR. Limited ROM due to pain of left shoulder. 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 Crepitus noted. Bilateral ankles without swelling, erythema or signs of infection. Given analgesia in ED. Plan is to DC home with follow up to previous Orthopedics. At time of discharge, Patient is in no acute distress. Vital Signs are stable. Patient is able to ambulate. Patient able to tolerate PO.    Disposition/Plan:  DC Home Additional Verbal discharge instructions given and discussed with patient.   Pt Instructed to f/u with Ortho in the next week for evaluation and treatment of symptoms. Return precautions given Pt acknowledges and agrees with plan  Supervising Physician Merryl Hacker, MD   Final diagnoses:  Musculoskeletal pain  Bilateral ankle pain  Left shoulder pain       Shary Decamp, PA-C 11/15/15 0730  Merryl Hacker, MD 11/15/15 2258

## 2015-11-15 NOTE — Discharge Instructions (Signed)
Please read and follow all provided instructions.  Your diagnoses today include:  1. Musculoskeletal pain   2. Bilateral ankle pain   3. Left shoulder pain    Tests performed today include:  Vital signs. See below for your results today.    You can use Ibuprofen 400mg  combined with Tylenol 1000mg  for pain relief every 6 hours. Do not exceed 4g of Tylenol in one 24 hour period.   PRICE in the first 24-48 hours after injury: Protect (with brace, splint, sling), if given by your provider Rest Ice- Do not apply ice pack directly to your skin, place towel or similar between your skin and ice/ice pack. Apply ice for 20 min, then remove for 40 min while awake Compression- Wear brace, elastic bandage, splint as directed by your provider Elevate affected extremity above the level of your heart when not walking around for the first 24-48 hours   Home care instructions:  Follow any educational materials contained in this packet.  Follow-up instructions: Please follow-up with your primary care provider in the next 48 hours for further evaluation of symptoms and treatment   Return instructions:   Please return to the Emergency Department if you do not get better, if you get worse, or new symptoms OR  - Fever (temperature greater than 101.35F)  - Bleeding that does not stop with holding pressure to the area    -Severe pain (please note that you may be more sore the day after your accident)  - Chest Pain  - Difficulty breathing  - Severe nausea or vomiting  - Inability to tolerate food and liquids  - Passing out  - Skin becoming red around your wounds  - Change in mental status (confusion or lethargy)  - New numbness or weakness     Please return if you have any other emergent concerns.  Additional Information:  Your vital signs today were: BP 144/73 mmHg   Pulse 90   Temp(Src) 97.5 F (36.4 C) (Oral)   Resp 16   Ht 5\' 11"  (1.803 m)   Wt 131.09 kg   BMI 40.33 kg/m2   SpO2 99% If your  blood pressure (BP) was elevated above 135/85 this visit, please have this repeated by your doctor within one month. ---------------

## 2015-11-15 NOTE — ED Notes (Signed)
Patient comes from home states he started having ankle and shoulder pain for 2 days. Patient describes the pain as aching. Patient endores pain feels like Arthritis. Patient able to move all upper and Lower Extremities . Patient states he has tried OTC medication with no relief. Patient alert and oriented x4 . Mild swelling noted bilateral (non pitting ).

## 2015-11-18 ENCOUNTER — Encounter: Payer: Self-pay | Admitting: Medical

## 2015-11-25 ENCOUNTER — Encounter: Payer: BLUE CROSS/BLUE SHIELD | Admitting: Medical

## 2015-12-05 ENCOUNTER — Encounter: Payer: Self-pay | Admitting: Medical

## 2015-12-05 ENCOUNTER — Ambulatory Visit (INDEPENDENT_AMBULATORY_CARE_PROVIDER_SITE_OTHER): Payer: BLUE CROSS/BLUE SHIELD | Admitting: Medical

## 2015-12-05 VITALS — BP 118/70 | HR 96 | Wt 278.0 lb

## 2015-12-05 DIAGNOSIS — J309 Allergic rhinitis, unspecified: Secondary | ICD-10-CM | POA: Diagnosis not present

## 2015-12-05 DIAGNOSIS — J0110 Acute frontal sinusitis, unspecified: Secondary | ICD-10-CM | POA: Diagnosis not present

## 2015-12-05 DIAGNOSIS — Z9119 Patient's noncompliance with other medical treatment and regimen: Secondary | ICD-10-CM | POA: Diagnosis not present

## 2015-12-05 DIAGNOSIS — I10 Essential (primary) hypertension: Secondary | ICD-10-CM | POA: Diagnosis not present

## 2015-12-05 DIAGNOSIS — Z1211 Encounter for screening for malignant neoplasm of colon: Secondary | ICD-10-CM

## 2015-12-05 DIAGNOSIS — E1141 Type 2 diabetes mellitus with diabetic mononeuropathy: Secondary | ICD-10-CM

## 2015-12-05 DIAGNOSIS — K219 Gastro-esophageal reflux disease without esophagitis: Secondary | ICD-10-CM | POA: Diagnosis not present

## 2015-12-05 DIAGNOSIS — E785 Hyperlipidemia, unspecified: Secondary | ICD-10-CM

## 2015-12-05 DIAGNOSIS — R350 Frequency of micturition: Secondary | ICD-10-CM | POA: Diagnosis not present

## 2015-12-05 DIAGNOSIS — R972 Elevated prostate specific antigen [PSA]: Secondary | ICD-10-CM | POA: Insufficient documentation

## 2015-12-05 DIAGNOSIS — R351 Nocturia: Secondary | ICD-10-CM

## 2015-12-05 DIAGNOSIS — Z91199 Patient's noncompliance with other medical treatment and regimen due to unspecified reason: Secondary | ICD-10-CM

## 2015-12-05 LAB — COMPREHENSIVE METABOLIC PANEL
ALT: 8 U/L — ABNORMAL LOW (ref 9–46)
AST: 14 U/L (ref 10–35)
Albumin: 4.1 g/dL (ref 3.6–5.1)
Alkaline Phosphatase: 80 U/L (ref 40–115)
BUN: 15 mg/dL (ref 7–25)
CHLORIDE: 101 mmol/L (ref 98–110)
CO2: 27 mmol/L (ref 20–31)
Calcium: 9.3 mg/dL (ref 8.6–10.3)
Creat: 1.18 mg/dL (ref 0.70–1.33)
GLUCOSE: 125 mg/dL — AB (ref 65–99)
POTASSIUM: 5 mmol/L (ref 3.5–5.3)
Sodium: 139 mmol/L (ref 135–146)
Total Bilirubin: 0.5 mg/dL (ref 0.2–1.2)
Total Protein: 6.9 g/dL (ref 6.1–8.1)

## 2015-12-05 LAB — CBC WITH DIFFERENTIAL/PLATELET
Basophils Absolute: 0 cells/uL (ref 0–200)
Basophils Relative: 0 %
EOS PCT: 1 %
Eosinophils Absolute: 148 cells/uL (ref 15–500)
HEMATOCRIT: 43.2 % (ref 38.5–50.0)
HEMOGLOBIN: 14.5 g/dL (ref 13.2–17.1)
LYMPHS ABS: 2664 {cells}/uL (ref 850–3900)
LYMPHS PCT: 18 %
MCH: 29.1 pg (ref 27.0–33.0)
MCHC: 33.6 g/dL (ref 32.0–36.0)
MCV: 86.7 fL (ref 80.0–100.0)
MPV: 9.1 fL (ref 7.5–12.5)
Monocytes Absolute: 888 cells/uL (ref 200–950)
Monocytes Relative: 6 %
NEUTROS PCT: 75 %
Neutro Abs: 11100 cells/uL — ABNORMAL HIGH (ref 1500–7800)
Platelets: 365 10*3/uL (ref 140–400)
RBC: 4.98 MIL/uL (ref 4.20–5.80)
RDW: 14.2 % (ref 11.0–15.0)
WBC: 14.8 10*3/uL — AB (ref 4.0–10.5)

## 2015-12-05 MED ORDER — GABAPENTIN 300 MG PO CAPS: 300.0000 mg | ORAL_CAPSULE | Freq: Three times a day (TID) | ORAL | Status: DC

## 2015-12-05 MED ORDER — ASPIRIN EC 81 MG PO TBEC: 81.0000 mg | DELAYED_RELEASE_TABLET | Freq: Every day | ORAL | Status: DC

## 2015-12-05 MED ORDER — TADALAFIL 20 MG PO TABS: 20.0000 mg | ORAL_TABLET | Freq: Every day | ORAL | Status: DC | PRN

## 2015-12-05 MED ORDER — METFORMIN HCL 500 MG PO TABS: 500.0000 mg | ORAL_TABLET | Freq: Every day | ORAL | Status: DC

## 2015-12-05 MED ORDER — LISINOPRIL 20 MG PO TABS: 20.0000 mg | ORAL_TABLET | Freq: Every day | ORAL | Status: DC

## 2015-12-05 MED ORDER — CETIRIZINE HCL 10 MG PO TABS: 10.0000 mg | ORAL_TABLET | Freq: Every day | ORAL | Status: DC

## 2015-12-05 MED ORDER — AMOXICILLIN 875 MG PO TABS: 875.0000 mg | ORAL_TABLET | Freq: Two times a day (BID) | ORAL | Status: DC

## 2015-12-05 NOTE — Patient Instructions (Signed)
Encounter Diagnoses  Name Primary?  . Type 2 diabetes mellitus with diabetic mononeuropathy, without long-term current use of insulin ([place]) Yes  . Essential hypertension   . Gastroesophageal reflux disease without esophagitis   . Hyperlipidemia   . Nocturia   . Special screening for malignant neoplasms, colon   . Urinary frequency   . Elevated PSA   . Noncompliance   . Allergic rhinitis, unspecified allergic rhinitis type   . Acute frontal sinusitis, recurrence not specified    Recommendations:  Begin Amoxicillin antibiotic for sinus infection  Begin Cetirizine allergy pill daily at bedtime for allergies;  This can be taken year round for stuffiness, runny nose, watery eyes  Begin a baby aspirin daily at bedtime for heart disease prevention  Begin Cialis, 1 tablet once daily maximum as needed for erectile dysfunction  We will call Monday with labs  You may end up needing to see Urology for elevated prostate lab  Try and keep your pharmacy consistent  Continue the same medications for blood pressure, neuropathy and diabetes  We normally need to see you every 6 months for diabetes follow up

## 2015-12-05 NOTE — Progress Notes (Signed)
Subjective: Chief Complaint  Patient presents with  . med check    out of all medications. stated that it is his diabetic neuropathy.    Here for med check. He reports being out of all medications.     Last visit was physical in 03/2015 and at that time had elevated PSA.   He was due to return in 2 weeks, but never came back.  He was also due back in 19mo for diabetes and is just now following up.    Diabetes - checks glucose occasionally, not often.  Tries to eat right, tries to be good about taking his pill.   Prostate concern - still urinating at night some, sometimes has urinary frequency.  He states he never got a phone call back in September from labs although our documentation states we did in fact talk to him.   Thus, he never took the Cipro.     hypertension - compliant with BP medication.     Neuropathy - been out of the gabapentin for months.  Having some problems with ED, trouble getting and keeping erections.  Has used levitra, Viagra.  Sometimes the medication helped, sometimes not.    He notes some problems with his sinuses and allergies.   Nose running a lot, eyes burning, stuffy head, eyes watery, nose burning.  Does have some sinus pressure and pain.  No ear pain, sometimes has sore throat.  No fever.     Past Medical History  Diagnosis Date  . Hypertension   . Type 2 diabetes mellitus with diabetic neuropathy ([place])   . Hyperlipidemia   . Wears glasses   . Polyarthralgia     shoulders, knees, ankles, wrists; neg rheum lab screen 04/2014  . Foot pain, bilateral     referred to podiatry 06/2014  . Allergy   . GERD (gastroesophageal reflux disease)     long history of   Past Surgical History  Procedure Laterality Date  . Colonoscopy      referral pending 03/2015  . No past surgeries      as of 03/2015   Current Outpatient Prescriptions on File Prior to Visit  Medication Sig Dispense Refill  . gabapentin (NEURONTIN) 300 MG capsule Take 1 capsule (300 mg total)  by mouth 3 (three) times daily. (Patient not taking: Reported on 12/05/2015) 90 capsule 3  . lisinopril (PRINIVIL,ZESTRIL) 20 MG tablet Take 1 tablet (20 mg total) by mouth daily. (Patient not taking: Reported on 12/05/2015) 90 tablet 3  . metFORMIN (GLUCOPHAGE) 500 MG tablet Take 1 tablet (500 mg total) by mouth daily with breakfast. (Patient not taking: Reported on 12/05/2015) 90 tablet 1   No current facility-administered medications on file prior to visit.     ROS as in subjective  Objective: BP 118/70 mmHg  Pulse 96  Wt 278 lb (126.1 kg)  Wt Readings from Last 3 Encounters:  12/05/15 278 lb (126.1 kg)  11/15/15 289 lb (131.09 kg)  03/14/15 290 lb 6.4 oz (131.725 kg)   General appearance: alert, no distress, WD/WN  HE ENT: normocephalic, sclerae anicteric, +sinus tenderness, TMs pearly, nares patent, no discharge or erythema, pharynx normal Oral cavity: MMM, no lesions Neck: supple, no lymphadenopathy, no thyromegaly, no masses, no bruits Heart: RRR, normal S1, S2, no murmurs Lungs: CTA bilaterally, no wheezes, rhonchi, or rales Abdomen: +bs, soft, non tender, non distended, no masses, no hepatomegaly, no splenomegaly Pulses: 2+ symmetric, upper and lower extremities, normal cap refill DRE: anus normal tone, prostate  mildly enlarged but no boggy, no nodules, but can't palpate entire prostate given body habitus    Assessment: Encounter Diagnoses  Name Primary?  . Type 2 diabetes mellitus with diabetic mononeuropathy, without long-term current use of insulin ([place]) Yes  . Essential hypertension   . Gastroesophageal reflux disease without esophagitis   . Hyperlipidemia   . Nocturia   . Special screening for malignant neoplasms, colon   . Urinary frequency   . Elevated PSA   . Noncompliance   . Allergic rhinitis, unspecified allergic rhinitis type   . Acute frontal sinusitis, recurrence not specified     Plan: discussed noncompliance, why he wasn't getting refills from  pharmacy even though refills were available.  He denies getting call back on labs in 03/2015 although our documentation shows we did talk to him.   Nevertheless, repeat labs today, likely referral to urology for PSA elevation  Begin amoxicillin for sinuitis, cetirizine for allergies ongoing  He declines colonoscopy for now  Discussed importance of routine f/u here for diabetes.    Advised he call before running out of medications  Recommendations:  Begin Amoxicillin antibiotic for sinus infection  Begin Cetirizine allergy pill daily at bedtime for allergies;  This can be taken year round for stuffiness, runny nose, watery eyes  Begin a baby aspirin daily at bedtime for heart disease prevention  Begin Cialis, 1 tablet once daily maximum as needed for erectile dysfunction  We will call Monday with labs  You may end up needing to see Urology for elevated prostate lab  Try and keep your pharmacy consistent  Continue the same medications for blood pressure, neuropathy and diabetes  We normally need to see you every 6 months for diabetes follow up

## 2015-12-06 LAB — PSA, TOTAL AND FREE
PSA FREE: 0.7 ng/mL
PSA, Free Pct: 7 % — ABNORMAL LOW (ref >25)
PSA: 9.57 ng/mL — ABNORMAL HIGH (ref <=4.00)

## 2015-12-09 ENCOUNTER — Telehealth: Payer: Self-pay | Admitting: Medical

## 2015-12-09 ENCOUNTER — Encounter: Payer: Self-pay | Admitting: Internal Medicine

## 2015-12-09 NOTE — Addendum Note (Signed)
Addended by: Minette Headland A on: 12/09/2015 09:02 AM   Modules accepted: Orders

## 2015-12-12 NOTE — Telephone Encounter (Signed)
P.A. Approved for max 4 per day , over this limit can not be requested

## 2015-12-15 NOTE — Telephone Encounter (Signed)
Pt informed, called pharmacy & they reran for #4 for 30 days and it went thru but cost is $150

## 2016-02-19 ENCOUNTER — Encounter: Payer: Self-pay | Admitting: Physical Medicine & Rehabilitation

## 2016-03-05 ENCOUNTER — Encounter: Payer: PRIVATE HEALTH INSURANCE | Admitting: Physical Medicine & Rehabilitation

## 2016-03-18 ENCOUNTER — Encounter
Payer: PRIVATE HEALTH INSURANCE | Attending: Physical Medicine & Rehabilitation | Admitting: Physical Medicine & Rehabilitation

## 2016-04-02 ENCOUNTER — Other Ambulatory Visit: Payer: Self-pay | Admitting: Medical

## 2016-04-02 DIAGNOSIS — E1141 Type 2 diabetes mellitus with diabetic mononeuropathy: Secondary | ICD-10-CM

## 2016-05-12 ENCOUNTER — Other Ambulatory Visit: Payer: Self-pay | Admitting: Medical

## 2016-05-12 DIAGNOSIS — E1141 Type 2 diabetes mellitus with diabetic mononeuropathy: Secondary | ICD-10-CM

## 2016-05-14 ENCOUNTER — Other Ambulatory Visit: Payer: Self-pay | Admitting: Medical

## 2016-05-14 ENCOUNTER — Telehealth: Payer: Self-pay | Admitting: Medical

## 2016-05-14 MED ORDER — GABAPENTIN 400 MG PO CAPS: 400.0000 mg | ORAL_CAPSULE | Freq: Three times a day (TID) | ORAL | 0 refills | Status: DC

## 2016-05-14 NOTE — Telephone Encounter (Signed)
Pt informed & called & switched to correct pharmacy

## 2016-05-14 NOTE — Telephone Encounter (Signed)
I increased to 400mg  TID

## 2016-05-14 NOTE — Telephone Encounter (Signed)
Pt states he's been having more & more issue with pain & burning in his feet.  He has been taking 4 a day of the Gabapentin instead of 3 so now he's ran out early & pharmacy says it's too early to fill.  He is completely out of medication & has to work tonight & he is hurting.  He request that you increase his dosage and or increase the mg and send in new Rx to [facility].  He has an appt with you on 12/4 & can't come in before because he doesn't have insurance until 06/04/16.

## 2016-06-03 ENCOUNTER — Telehealth: Payer: Self-pay | Admitting: Medical

## 2016-06-03 ENCOUNTER — Other Ambulatory Visit: Payer: Self-pay | Admitting: Medical

## 2016-06-03 MED ORDER — GABAPENTIN 400 MG PO CAPS: 400.0000 mg | ORAL_CAPSULE | Freq: Three times a day (TID) | ORAL | 2 refills | Status: DC

## 2016-06-03 NOTE — Telephone Encounter (Signed)
Done, I believe he is also due for med check appt.

## 2016-06-03 NOTE — Telephone Encounter (Signed)
Pt called and rescheduled his appt until 12/27. He states his work is sending him out of town and he will be out of Neurontin before he gets back. Please send refill to [facility]. Pt leaves tomorrow so he is requesting this be handled before he leaves.

## 2016-06-07 ENCOUNTER — Encounter: Payer: BLUE CROSS/BLUE SHIELD | Admitting: Medical

## 2016-06-30 ENCOUNTER — Encounter: Payer: BLUE CROSS/BLUE SHIELD | Admitting: Medical

## 2016-07-12 ENCOUNTER — Emergency Department (HOSPITAL_COMMUNITY)
Admission: EM | Admit: 2016-07-12 | Discharge: 2016-07-12 | Disposition: A | Discharge status: Home / Self Care | Payer: No Typology Code available for payment source | Attending: Emergency Medicine | Admitting: Emergency Medicine

## 2016-07-12 ENCOUNTER — Other Ambulatory Visit: Payer: Self-pay | Admitting: Medical

## 2016-07-12 ENCOUNTER — Encounter (HOSPITAL_COMMUNITY): Payer: Self-pay | Admitting: Emergency Medicine

## 2016-07-12 DIAGNOSIS — J3089 Other allergic rhinitis: Secondary | ICD-10-CM | POA: Diagnosis not present

## 2016-07-12 DIAGNOSIS — E1140 Type 2 diabetes mellitus with diabetic neuropathy, unspecified: Secondary | ICD-10-CM | POA: Insufficient documentation

## 2016-07-12 DIAGNOSIS — I10 Essential (primary) hypertension: Secondary | ICD-10-CM | POA: Insufficient documentation

## 2016-07-12 DIAGNOSIS — J310 Chronic rhinitis: Secondary | ICD-10-CM

## 2016-07-12 DIAGNOSIS — Z7982 Long term (current) use of aspirin: Secondary | ICD-10-CM | POA: Diagnosis not present

## 2016-07-12 DIAGNOSIS — R05 Cough: Secondary | ICD-10-CM | POA: Diagnosis present

## 2016-07-12 DIAGNOSIS — Z7984 Long term (current) use of oral hypoglycemic drugs: Secondary | ICD-10-CM | POA: Insufficient documentation

## 2016-07-12 MED ORDER — NAPROXEN 375 MG PO TABS: 375.0000 mg | ORAL_TABLET | Freq: Two times a day (BID) | ORAL | 0 refills | Status: DC

## 2016-07-12 MED ORDER — BUDESONIDE 32 MCG/ACT NA SUSP: 2.0000 | Freq: Every day | NASAL | 0 refills | Status: DC

## 2016-07-12 MED ORDER — CETIRIZINE HCL 10 MG PO TABS: 10.0000 mg | ORAL_TABLET | Freq: Every day | ORAL | 0 refills | Status: DC

## 2016-07-12 NOTE — ED Triage Notes (Signed)
Pt sts URI sx with congestion x 3 days

## 2016-07-12 NOTE — ED Notes (Signed)
Pt verbalized understanding of d/c instructions and has no further questions. VSS, NAD.  

## 2016-07-12 NOTE — ED Provider Notes (Signed)
[facility] DEPT Provider Note   CSN: [id number] Arrival date & time: 07/12/16  1618  By signing my name below, I, Jeanell Sparrow, attest that this documentation has been prepared under the direction and in the presence of non-physician practitioner, Etta Quill, NP. Electronically Signed: Jeanell Sparrow, Scribe. 07/12/2016. 10:29 PM.  History   Chief Complaint Chief Complaint  Patient presents with  . URI   The history is provided by the patient. No language interpreter was used.  URI   This is a new problem. The current episode started more than 2 days ago. The problem has been gradually worsening. There has been no fever. Associated symptoms include rhinorrhea, sinus pain and cough (Dry). Pertinent negatives include no abdominal pain, no nausea and no vomiting. He has tried other medications for the symptoms. The treatment provided no relief.   HPI Comments: Matthew Dekay Sr. is a 61 y.o. male with a PMHx of allergies who presents to the Emergency Department complaining of constant moderate sinus pain that started about 4 days ago. He has been having gradually worsening pain with associated clear discharge, watery eyes, and dry cough. He took allergy sinus OTC medication without relief. No modifying factors. He denies any fever, chills, abdominal pain, nausea, vomiting, generalized myalgias, or other complaints.     PCP: Crisoforo Oxford, PA-C  Past Medical History:  Diagnosis Date  . Allergy   . Foot pain, bilateral    referred to podiatry 06/2014  . GERD (gastroesophageal reflux disease)    long history of  . Hyperlipidemia   . Hypertension   . Polyarthralgia    shoulders, knees, ankles, wrists; neg rheum lab screen 04/2014  . Type 2 diabetes mellitus with diabetic neuropathy ([place])   . Wears glasses     Patient Active Problem List   Diagnosis Date Noted  . Elevated PSA 12/05/2015  . Noncompliance 12/05/2015  . Essential hypertension 03/14/2015  . Type 2 diabetes  mellitus with diabetic mononeuropathy ([place]) 03/14/2015  . Encounter for health maintenance examination in adult 03/14/2015  . Screening for prostate cancer 03/14/2015  . Special screening for malignant neoplasms, colon 03/14/2015  . Hyperlipidemia 03/14/2015  . Nocturia 03/14/2015  . Urinary frequency 03/14/2015  . Foot pain, bilateral 03/14/2015  . Need for prophylactic vaccination and inoculation against influenza 03/14/2015  . Need for prophylactic vaccination against Streptococcus pneumoniae (pneumococcus) 03/14/2015  . Need for Tdap vaccination 03/14/2015  . Chronic nausea 03/14/2015  . Gastroesophageal reflux disease without esophagitis 03/14/2015    Past Surgical History:  Procedure Laterality Date  . COLONOSCOPY     referral pending 03/2015  . NO PAST SURGERIES     as of 03/2015       Home Medications    Prior to Admission medications   Medication Sig Start Date End Date Taking? Authorizing Provider  amoxicillin (AMOXIL) 875 MG tablet Take 1 tablet (875 mg total) by mouth 2 (two) times daily. 12/05/15   Carlena Hurl, PA-C  aspirin EC 81 MG tablet Take 1 tablet (81 mg total) by mouth daily. 12/05/15   Camelia Eng Tysinger, PA-C  cetirizine (ZYRTEC) 10 MG tablet Take 1 tablet (10 mg total) by mouth at bedtime. 12/05/15   Camelia Eng Tysinger, PA-C  gabapentin (NEURONTIN) 400 MG capsule Take 1 capsule (400 mg total) by mouth 3 (three) times daily. 06/03/16   Camelia Eng Tysinger, PA-C  lisinopril (PRINIVIL,ZESTRIL) 20 MG tablet Take 1 tablet (20 mg total) by mouth daily. 12/05/15   Camelia Eng  Tysinger, PA-C  metFORMIN (GLUCOPHAGE) 500 MG tablet TAKE ONE TABLET BY MOUTH DAILY WITH BREAKFAST 04/02/16   Camelia Eng Tysinger, PA-C  metFORMIN (GLUCOPHAGE) 500 MG tablet TAKE ONE TABLET BY MOUTH ONCE DAILY WITH BREAKFAST 05/12/16   Camelia Eng Tysinger, PA-C  metFORMIN (GLUCOPHAGE) 500 MG tablet TAKE ONE TABLET BY MOUTH WITH BREAKFAST 07/12/16   Camelia Eng Tysinger, PA-C  tadalafil (CIALIS) 20 MG tablet Take 1  tablet (20 mg total) by mouth daily as needed for erectile dysfunction. 12/05/15   Carlena Hurl, PA-C    Family History Family History  Problem Relation Age of Onset  . Diabetes Mother   . Hypertension Mother   . Diabetes Sister   . Arthritis Sister   . Heart disease Sister   . Other Father     murdered  . Cancer Neg Hx   . Stroke Neg Hx   . Diabetes Sister   . Diabetes Sister     Social History Social History  Substance Use Topics  . Smoking status: Never Smoker  . Smokeless tobacco: Not on file  . Alcohol use Yes     Comment: occasional beer     Allergies   Patient has no known allergies.   Review of Systems Review of Systems  Constitutional: Negative for chills and fever.  HENT: Positive for rhinorrhea and sinus pain.   Respiratory: Positive for cough (Dry).   Gastrointestinal: Negative for abdominal pain, nausea and vomiting.  Musculoskeletal: Negative for myalgias.  All other systems reviewed and are negative.    Physical Exam Updated Vital Signs BP 158/92 (BP Location: Right Arm)   Pulse 97   Temp 98.3 F (36.8 C) (Oral)   Resp 18   SpO2 99%   Physical Exam  Constitutional: He appears well-developed and well-nourished. No distress.  HENT:  Head: Normocephalic and atraumatic.  Right Ear: Tympanic membrane normal.  Left Ear: Tympanic membrane normal.  Mouth/Throat: No oropharyngeal exudate, posterior oropharyngeal edema or posterior oropharyngeal erythema.  Nasal mucosal edema.   Eyes: Conjunctivae are normal.  Eyelids are mildly edematous.   Neck: Neck supple.  Cardiovascular: Normal rate and regular rhythm.   Pulmonary/Chest: Effort normal. No respiratory distress. He has no wheezes. He has no rales.  Abdominal: Soft.  Musculoskeletal: Normal range of motion.  Neurological: He is alert.  Skin: Skin is warm and dry.  Psychiatric: He has a normal mood and affect.  Nursing note and vitals reviewed.    ED Treatments / Results  DIAGNOSTIC  STUDIES: Oxygen Saturation is 99% on RA, normal by my interpretation.    COORDINATION OF CARE: 10:33 PM- Pt advised of plan for treatment and pt agrees.  Labs (all labs ordered are listed, but only abnormal results are displayed) Labs Reviewed - No data to display  EKG  EKG Interpretation None       Radiology No results found.  Procedures Procedures (including critical care time)  Medications Ordered in ED Medications - No data to display   Initial Impression / Assessment and Plan / ED Course  I have reviewed the triage vital signs and the nursing notes.  Pertinent labs & imaging results that were available during my care of the patient were reviewed by me and considered in my medical decision making (see chart for details).  Clinical Course    Pt symptoms consistent with allergic rhinitis. Pt will be discharged with symptomatic treatment, including rhinocort, zyrtec, and naproxen.  Discussed return precautions.  Pt is hemodynamically stable &  in NAD prior to discharge.   Final Clinical Impressions(s) / ED Diagnoses   Final diagnoses:  Other rhinitis, unspecified chronicity    New Prescriptions Discharge Medication List as of 07/12/2016 10:41 PM    START taking these medications   Details  budesonide (RHINOCORT AQUA) 32 MCG/ACT nasal spray Place 2 sprays into both nostrils daily., Starting Mon 07/12/2016, Print    naproxen (NAPROSYN) 375 MG tablet Take 1 tablet (375 mg total) by mouth 2 (two) times daily., Starting Mon 07/12/2016, Print       I personally performed the services described in this documentation, which was scribed in my presence. The recorded information has been reviewed and is accurate.     Etta Quill, NP 07/13/16 [id number]    Merrily Pew, MD 07/13/16 1248

## 2016-07-12 NOTE — ED Notes (Signed)
Pt presents with sinus congestion and pressure since Wednesday.  Sts his whole face hurts and he is draining clear nasal secretions.  Sts when he gets overheated, he reports increase in nasal drainage and an "ammonia-like" smell. Redness noted to back of throat.  Occasional cough noted.  No other complaints at this time.

## 2016-07-30 ENCOUNTER — Telehealth: Payer: Self-pay | Admitting: Medical

## 2016-07-30 ENCOUNTER — Telehealth: Payer: Self-pay

## 2016-07-30 NOTE — Telephone Encounter (Signed)
Pt requesting refills on Metformin 500 mg, Lisinopril 20 mg, Cialis & acid reflux meds. Pt understand that he has been dismissed and is just shy of the 30 days to get refills but want to know if Audelia Acton can still help him.

## 2016-07-30 NOTE — Telephone Encounter (Signed)
Pt  Was

## 2016-07-30 NOTE — Telephone Encounter (Signed)
Refills have already sent to pharmacy on 07/12/16.

## 2016-07-30 NOTE — Telephone Encounter (Signed)
Call in to approve 30 day supply on his routine meds (non controlled substances)

## 2016-08-03 ENCOUNTER — Other Ambulatory Visit: Payer: Self-pay

## 2016-08-03 DIAGNOSIS — I10 Essential (primary) hypertension: Secondary | ICD-10-CM

## 2016-08-03 DIAGNOSIS — E1141 Type 2 diabetes mellitus with diabetic mononeuropathy: Secondary | ICD-10-CM

## 2016-08-03 MED ORDER — LISINOPRIL 20 MG PO TABS: 20.0000 mg | ORAL_TABLET | Freq: Every day | ORAL | 3 refills | Status: DC

## 2016-08-03 MED ORDER — METFORMIN HCL 500 MG PO TABS: 500.0000 mg | ORAL_TABLET | Freq: Every day | ORAL | 0 refills | Status: DC

## 2016-08-03 NOTE — Telephone Encounter (Signed)
Pt states refills from 07/30/2016 have not been called in. [phone]. Matthew Colon

## 2016-08-03 NOTE — Telephone Encounter (Signed)
Sent referral to pharmacy  

## 2016-08-18 ENCOUNTER — Other Ambulatory Visit: Payer: Self-pay | Admitting: Medical

## 2016-08-23 ENCOUNTER — Other Ambulatory Visit: Payer: Self-pay | Admitting: Medical

## 2016-09-06 ENCOUNTER — Ambulatory Visit (INDEPENDENT_AMBULATORY_CARE_PROVIDER_SITE_OTHER): Payer: PRIVATE HEALTH INSURANCE | Admitting: Physician Assistant

## 2016-09-06 ENCOUNTER — Encounter (INDEPENDENT_AMBULATORY_CARE_PROVIDER_SITE_OTHER): Payer: Self-pay | Admitting: Physician Assistant

## 2016-09-06 VITALS — BP 144/83 | HR 86 | Temp 98.0°F | Ht 71.0 in | Wt 303.8 lb

## 2016-09-06 DIAGNOSIS — I10 Essential (primary) hypertension: Secondary | ICD-10-CM

## 2016-09-06 DIAGNOSIS — E1141 Type 2 diabetes mellitus with diabetic mononeuropathy: Secondary | ICD-10-CM | POA: Diagnosis not present

## 2016-09-06 DIAGNOSIS — G8929 Other chronic pain: Secondary | ICD-10-CM | POA: Diagnosis not present

## 2016-09-06 DIAGNOSIS — M25512 Pain in left shoulder: Secondary | ICD-10-CM | POA: Diagnosis not present

## 2016-09-06 LAB — POCT GLYCOSYLATED HEMOGLOBIN (HGB A1C): HEMOGLOBIN A1C: 6.4

## 2016-09-06 MED ORDER — CAMPHOR-MENTHOL-METHYL SAL 1.2-5.7-6.3 % EX PTCH: 1.0000 | MEDICATED_PATCH | CUTANEOUS | 0 refills | Status: AC

## 2016-09-06 MED ORDER — METFORMIN HCL 500 MG PO TABS
500.0000 mg | ORAL_TABLET | Freq: Every day | ORAL | 1 refills | Status: DC
Start: 2016-09-06 — End: 2017-01-10

## 2016-09-06 MED ORDER — LISINOPRIL 40 MG PO TABS: 40.0000 mg | ORAL_TABLET | Freq: Every day | ORAL | 3 refills | Status: DC

## 2016-09-06 NOTE — Patient Instructions (Signed)
Please return in 4 weeks for hypertension follow up and complete diabetic exam. I have placed a referral to physical therapy for your shoulder pain. We will call you with appointment details.   Shoulder Pain Many things can cause shoulder pain, including:  An injury to the area.  Overuse of the shoulder.  Arthritis. The source of the pain can be:  Inflammation.  An injury to the shoulder joint.  An injury to a tendon, ligament, or bone. Follow these instructions at home: Take these actions to help with your pain:  Squeeze a soft ball or a foam pad as much as possible. This helps to keep the shoulder from swelling. It also helps to strengthen the arm.  Take over-the-counter and prescription medicines only as told by your health care provider.  If directed, apply ice to the area:  Put ice in a plastic bag.  Place a towel between your skin and the bag.  Leave the ice on for 20 minutes, 2-3 times per day. Stop applying ice if it does not help with the pain.  If you were given a shoulder sling or immobilizer:  Wear it as told.  Remove it to shower or bathe.  Move your arm as little as possible, but keep your hand moving to prevent swelling. Contact a health care provider if:  Your pain gets worse.  Your pain is not relieved with medicines.  New pain develops in your arm, hand, or fingers. Get help right away if:  Your arm, hand, or fingers:  Tingle.  Become numb.  Become swollen.  Become painful.  Turn white or blue. This information is not intended to replace advice given to you by your health care provider. Make sure you discuss any questions you have with your health care provider. Document Released: 03/31/2005 Document Revised: 02/15/2016 Document Reviewed: 10/14/2014 Elsevier Interactive Patient Education  2017 [employer].

## 2016-09-06 NOTE — Progress Notes (Signed)
Subjective:  Patient ID: Matthew Marus Sr., male    DOB: [birth year]  Age: 61 y.o. MRN: [id number]  CC: f/u diabetes and hypertension  HPI Johanny Berkey Sr. is a 61 y.o. male with a PMH of Hypertension, hyperlipidemia, diabetes, and left shoulder pain results today to establish care for his diabetes and hypertension. Also complains of continued left shoulder pain. Says that he was previously treated at [facility] for shoulder pain to include physical therapy. He also received multiple "injections" which have done little to relieve pain. Patient will need medication refills for metformin lisinopril. States that he's had polydipsia, polyphagia, polyuria, visual blurring, fatigue, mental dullness. Does not endorse any other symptoms.  Past Medical History:  Diagnosis Date  . Allergy   . Foot pain, bilateral    referred to podiatry 06/2014  . GERD (gastroesophageal reflux disease)    long history of  . Hyperlipidemia   . Hypertension   . Polyarthralgia    shoulders, knees, ankles, wrists; neg rheum lab screen 04/2014  . Type 2 diabetes mellitus with diabetic neuropathy ([place])   . Wears glasses      Outpatient Medications Prior to Visit  Medication Sig Dispense Refill  . gabapentin (NEURONTIN) 400 MG capsule Take 1 capsule (400 mg total) by mouth 3 (three) times daily. 90 capsule 2  . lisinopril (PRINIVIL,ZESTRIL) 20 MG tablet Take 1 tablet (20 mg total) by mouth daily. 90 tablet 3  . metFORMIN (GLUCOPHAGE) 500 MG tablet TAKE ONE TABLET BY MOUTH ONCE DAILY WITH BREAKFAST 90 tablet 1  . aspirin EC 81 MG tablet Take 1 tablet (81 mg total) by mouth daily. 90 tablet 3  . budesonide (RHINOCORT AQUA) 32 MCG/ACT nasal spray Place 2 sprays into both nostrils daily. 8.43 mL 0  . cetirizine (ZYRTEC) 10 MG tablet Take 1 tablet (10 mg total) by mouth daily. 30 tablet 0  . naproxen (NAPROSYN) 375 MG tablet Take 1 tablet (375 mg total) by mouth 2 (two) times daily. 20 tablet 0  .  tadalafil (CIALIS) 20 MG tablet Take 1 tablet (20 mg total) by mouth daily as needed for erectile dysfunction. 10 tablet 2  . amoxicillin (AMOXIL) 875 MG tablet Take 1 tablet (875 mg total) by mouth 2 (two) times daily. (Patient not taking: Reported on 09/06/2016) 20 tablet 0  . metFORMIN (GLUCOPHAGE) 500 MG tablet TAKE ONE TABLET BY MOUTH WITH BREAKFAST 60 tablet 0  . metFORMIN (GLUCOPHAGE) 500 MG tablet Take 1 tablet (500 mg total) by mouth daily with breakfast. 90 tablet 0   No facility-administered medications prior to visit.      ROS Review of Systems  Constitutional: Negative for chills, fever and malaise/fatigue.  Eyes: Negative for blurred vision.  Respiratory: Negative for shortness of breath.   Cardiovascular: Negative for chest pain and palpitations.  Gastrointestinal: Negative for abdominal pain and nausea.  Genitourinary: Negative for dysuria and hematuria.  Musculoskeletal: Positive for joint pain (Left shoulder). Negative for myalgias.  Skin: Negative for rash.  Neurological: Negative for tingling and headaches.  Psychiatric/Behavioral: Negative for depression. The patient is not nervous/anxious.     Objective:  BP (!) 144/83 (BP Location: Left Arm, Patient Position: Sitting, Cuff Size: Large)   Pulse 86   Temp 98 F (36.7 C) (Oral)   Ht 5\' 11"  (1.803 m)   Wt (!) 303 lb 12.8 oz (137.8 kg)   SpO2 96%   BMI 42.37 kg/m   BP/Weight 09/06/2016 [id number]  Systolic BP 123456  123456 123456  Diastolic BP 83 88 70  Wt. (Lbs) 303.8 - 278  BMI 42.37 - 38.79      Physical Exam  Constitutional: He is oriented to person, place, and time.  Obese, NAD, polite  HENT:  Head: Normocephalic and atraumatic.  Eyes: No scleral icterus.  Neck: Normal range of motion. Neck supple. No thyromegaly present.  Cardiovascular: Normal rate, regular rhythm and normal heart sounds.   Pulmonary/Chest: Effort normal and breath sounds normal.  Abdominal: Soft. Bowel sounds are normal. There  is no tenderness.  Musculoskeletal: He exhibits no edema.  Left shoulder with limited AROM on abduction to approximately 80. Passive range of motion at full range with no pain elicited. All provocative testing positive to include AC shear, Hawkins, Neer's, Cross body, terminal rotation resistance, external rotation resistance, O'Briens, and crank test.  Neurological: He is alert and oriented to person, place, and time.  Skin: Skin is warm and dry. No rash noted. No erythema. No pallor.  Psychiatric: He has a normal mood and affect. His behavior is normal. Thought content normal.  Vitals reviewed.    Assessment & Plan:   1. Type 2 diabetes mellitus with diabetic mononeuropathy, without long-term current use of insulin (HCC) - HgB A1c 6.4. Controlled on metformin 500 mg daily. - Refill metformin 500 mg daily  2. Essential hypertension -Increase lisinopril from 20 mg to 40 mg daily.  3. Chronic left shoulder pain -Lidocaine patch - Physical exam findings inconclusive due to multiple positives on provocative testing. - Advised patient to return to "[facility]" for further treatment. He agreed to continuing care with [facility] for his left shoulder pain.   Meds ordered this encounter  Medications  . lisinopril (PRINIVIL,ZESTRIL) 40 MG tablet    Sig: Take 1 tablet (40 mg total) by mouth daily.    Dispense:  90 tablet    Refill:  3    Order Specific Question:   Supervising Provider    Answer:   Tresa Garter [id number]  . metFORMIN (GLUCOPHAGE) 500 MG tablet    Sig: Take 1 tablet (500 mg total) by mouth daily with breakfast.    Dispense:  90 tablet    Refill:  1    Order Specific Question:   Supervising Provider    Answer:   Tresa Garter [id number]  . Camphor-Menthol-Methyl Sal (HM SALONPAS PAIN RELIEF) 1.2-5.7-6.3 % PTCH    Sig: Apply 1 patch topically 1 day or 1 dose.    Dispense:  6 patch    Refill:  0    Order Specific Question:   Supervising  Provider    Answer:   Tresa Garter [id number]    Follow-up: Return in about 4 weeks (around 10/04/2016) for htn f/u.   Clent Demark PA

## 2016-09-08 ENCOUNTER — Telehealth (INDEPENDENT_AMBULATORY_CARE_PROVIDER_SITE_OTHER): Payer: Self-pay | Admitting: Physician Assistant

## 2016-09-08 NOTE — Telephone Encounter (Signed)
Patient called requesting Rx refill :   gabapentin (NEURONTIN) 400 MG capsule                Please call in to:  [facility], [address]    Please inform patient when it has been called in

## 2016-09-09 ENCOUNTER — Ambulatory Visit (INDEPENDENT_AMBULATORY_CARE_PROVIDER_SITE_OTHER): Payer: PRIVATE HEALTH INSURANCE | Admitting: Physician Assistant

## 2016-09-09 ENCOUNTER — Other Ambulatory Visit (INDEPENDENT_AMBULATORY_CARE_PROVIDER_SITE_OTHER): Payer: Self-pay | Admitting: Physician Assistant

## 2016-09-09 DIAGNOSIS — G629 Polyneuropathy, unspecified: Secondary | ICD-10-CM

## 2016-09-09 MED ORDER — GABAPENTIN 400 MG PO CAPS: 400.0000 mg | ORAL_CAPSULE | Freq: Three times a day (TID) | ORAL | 1 refills | Status: DC

## 2016-09-09 NOTE — Progress Notes (Signed)
Medication refill requested 

## 2016-09-09 NOTE — Telephone Encounter (Signed)
Gabapentin sent to requested pharmacy.

## 2016-10-05 ENCOUNTER — Ambulatory Visit (INDEPENDENT_AMBULATORY_CARE_PROVIDER_SITE_OTHER): Payer: PRIVATE HEALTH INSURANCE | Admitting: Physician Assistant

## 2016-10-11 ENCOUNTER — Ambulatory Visit: Payer: PRIVATE HEALTH INSURANCE | Admitting: Family Medicine

## 2016-10-27 ENCOUNTER — Ambulatory Visit (INDEPENDENT_AMBULATORY_CARE_PROVIDER_SITE_OTHER): Payer: PRIVATE HEALTH INSURANCE | Admitting: Physician Assistant

## 2016-11-11 ENCOUNTER — Ambulatory Visit (INDEPENDENT_AMBULATORY_CARE_PROVIDER_SITE_OTHER): Payer: PRIVATE HEALTH INSURANCE | Admitting: Physician Assistant

## 2016-11-24 ENCOUNTER — Other Ambulatory Visit (INDEPENDENT_AMBULATORY_CARE_PROVIDER_SITE_OTHER): Payer: Self-pay | Admitting: Physician Assistant

## 2016-11-24 DIAGNOSIS — G629 Polyneuropathy, unspecified: Secondary | ICD-10-CM

## 2016-11-25 NOTE — Telephone Encounter (Signed)
FWD to PCP. Matthew Colon S Matthew Colon, CMA  

## 2016-12-02 ENCOUNTER — Ambulatory Visit (HOSPITAL_COMMUNITY)
Admission: EM | Admit: 2016-12-02 | Discharge: 2016-12-02 | Disposition: A | Discharge status: Home / Self Care | Payer: No Typology Code available for payment source | Attending: Internal Medicine | Admitting: Internal Medicine

## 2016-12-02 ENCOUNTER — Encounter (HOSPITAL_COMMUNITY): Payer: Self-pay | Admitting: Emergency Medicine

## 2016-12-02 DIAGNOSIS — I10 Essential (primary) hypertension: Secondary | ICD-10-CM | POA: Diagnosis not present

## 2016-12-02 DIAGNOSIS — Z202 Contact with and (suspected) exposure to infections with a predominantly sexual mode of transmission: Secondary | ICD-10-CM | POA: Diagnosis not present

## 2016-12-02 DIAGNOSIS — E785 Hyperlipidemia, unspecified: Secondary | ICD-10-CM | POA: Insufficient documentation

## 2016-12-02 DIAGNOSIS — E1140 Type 2 diabetes mellitus with diabetic neuropathy, unspecified: Secondary | ICD-10-CM | POA: Diagnosis not present

## 2016-12-02 DIAGNOSIS — Z7982 Long term (current) use of aspirin: Secondary | ICD-10-CM | POA: Insufficient documentation

## 2016-12-02 DIAGNOSIS — R300 Dysuria: Secondary | ICD-10-CM

## 2016-12-02 DIAGNOSIS — Z7984 Long term (current) use of oral hypoglycemic drugs: Secondary | ICD-10-CM | POA: Diagnosis not present

## 2016-12-02 DIAGNOSIS — K219 Gastro-esophageal reflux disease without esophagitis: Secondary | ICD-10-CM | POA: Insufficient documentation

## 2016-12-02 LAB — POCT URINALYSIS DIP (DEVICE)
BILIRUBIN URINE: NEGATIVE
GLUCOSE, UA: NEGATIVE mg/dL
Hgb urine dipstick: NEGATIVE
KETONES UR: NEGATIVE mg/dL
Leukocytes, UA: NEGATIVE
NITRITE: NEGATIVE
PH: 7.5 (ref 5.0–8.0)
PROTEIN: NEGATIVE mg/dL
Specific Gravity, Urine: 1.015 (ref 1.005–1.030)
Urobilinogen, UA: 0.2 mg/dL (ref 0.0–1.0)

## 2016-12-02 MED ORDER — CEFTRIAXONE SODIUM 250 MG IJ SOLR
250.0000 mg | Freq: Once | INTRAMUSCULAR | Status: AC
  Administered 2016-12-02: 250 mg via INTRAMUSCULAR

## 2016-12-02 MED ORDER — LIDOCAINE HCL (PF) 1 % IJ SOLN
INTRAMUSCULAR | Status: AC
  Filled 2016-12-02: qty 2

## 2016-12-02 MED ORDER — AZITHROMYCIN 250 MG PO TABS
ORAL_TABLET | ORAL | Status: AC
  Filled 2016-12-02: qty 4

## 2016-12-02 MED ORDER — AZITHROMYCIN 250 MG PO TABS
1000.0000 mg | ORAL_TABLET | Freq: Once | ORAL | Status: AC
  Administered 2016-12-02: 1000 mg via ORAL

## 2016-12-02 MED ORDER — PHENAZOPYRIDINE HCL 200 MG PO TABS: 200.0000 mg | ORAL_TABLET | Freq: Three times a day (TID) | ORAL | 0 refills | Status: DC

## 2016-12-02 MED ORDER — CEFTRIAXONE SODIUM 250 MG IJ SOLR
INTRAMUSCULAR | Status: AC
  Filled 2016-12-02: qty 250

## 2016-12-02 NOTE — ED Notes (Signed)
Patient given instructions on dirty and clean specimens-urine, sent to bathroom

## 2016-12-02 NOTE — ED Triage Notes (Signed)
Patient reports painful urination started on Monday, 5/28.  Patient denies discharge

## 2016-12-02 NOTE — ED Provider Notes (Signed)
CSN: [id number]     Arrival date & time 12/02/16  1411 History   First MD Initiated Contact with Patient 12/02/16 1447     Chief Complaint  Patient presents with  . Exposure to STD   (Consider location/radiation/quality/duration/timing/severity/associated sxs/prior Treatment) Subjective:  Matthew Zehner Sr. is a 61 y.o. male who complains of dysuria for 4 days. Patient denies nausea, vomiting, back pain, fever, penile discharge or abdominal pain. Patient reports unprotected heterosexual encounter approxiamelty one week ago with a new partner. He is unsure of any STD exposures but states that this "burning sensation" is similar to prior STD several years ago as a teenager. The patient does not have a history of recurrent UTI.  Patient does not have a history of pyelonephritis.   The following portions of the patient's history were reviewed and updated as appropriate: allergies, current medications, past family history, past medical history, past social history, past surgical history and problem list.         Past Medical History:  Diagnosis Date  . Allergy   . Foot pain, bilateral    referred to podiatry 06/2014  . GERD (gastroesophageal reflux disease)    long history of  . Hyperlipidemia   . Hypertension   . Polyarthralgia    shoulders, knees, ankles, wrists; neg rheum lab screen 04/2014  . Type 2 diabetes mellitus with diabetic neuropathy ([place])   . Wears glasses    Past Surgical History:  Procedure Laterality Date  . COLONOSCOPY     referral pending 03/2015  . NO PAST SURGERIES     as of 03/2015   Family History  Problem Relation Age of Onset  . Diabetes Mother   . Hypertension Mother   . Diabetes Sister   . Arthritis Sister   . Heart disease Sister   . Other Father        murdered  . Diabetes Sister   . Diabetes Sister   . Cancer Neg Hx   . Stroke Neg Hx    Social History  Substance Use Topics  . Smoking status: Never Smoker  . Smokeless tobacco: Never Used   . Alcohol use Yes     Comment: occasional beer    Review of Systems  Gastrointestinal: Negative for abdominal pain, nausea and vomiting.  Genitourinary: Positive for dysuria. Negative for discharge, genital sores, penile pain, penile swelling, scrotal swelling and testicular pain.    Allergies  Patient has no known allergies.  Home Medications   Prior to Admission medications   Medication Sig Start Date End Date Taking? Authorizing Provider  aspirin EC 81 MG tablet Take 1 tablet (81 mg total) by mouth daily. 12/05/15   Tysinger, Camelia Eng, PA-C  budesonide (RHINOCORT AQUA) 32 MCG/ACT nasal spray Place 2 sprays into both nostrils daily. 07/12/16   Etta Quill, NP  cetirizine (ZYRTEC) 10 MG tablet Take 1 tablet (10 mg total) by mouth daily. 07/12/16   Etta Quill, NP  docusate sodium (COLACE) 100 MG capsule Take 200 mg by mouth daily as needed for mild constipation.    [provider]  gabapentin (NEURONTIN) 400 MG capsule TAKE 1 CAPSULE BY MOUTH THREE TIMES DAILY 11/25/16   Clent Demark, PA-C  lisinopril (PRINIVIL,ZESTRIL) 40 MG tablet Take 1 tablet (40 mg total) by mouth daily. 09/06/16   Clent Demark, PA-C  metFORMIN (GLUCOPHAGE) 500 MG tablet Take 1 tablet (500 mg total) by mouth daily with breakfast. 09/06/16   Clent Demark, PA-C  naproxen (NAPROSYN)  375 MG tablet Take 1 tablet (375 mg total) by mouth 2 (two) times daily. 07/12/16   Etta Quill, NP  tadalafil (CIALIS) 20 MG tablet Take 1 tablet (20 mg total) by mouth daily as needed for erectile dysfunction. 12/05/15   Tysinger, Camelia Eng, PA-C   Meds Ordered and Administered this Visit   Medications  azithromycin (ZITHROMAX) tablet 1,000 mg (not administered)  cefTRIAXone (ROCEPHIN) injection 250 mg (not administered)    BP 128/74 (BP Location: Left Arm) Comment (BP Location): large cuff  Pulse 85   Temp 98.4 F (36.9 C) (Oral)   Resp 18   SpO2 97%  No data found.   Physical Exam  Constitutional: He is  oriented to person, place, and time. He appears well-developed and well-nourished.  Neck: Neck supple.  Cardiovascular: Normal rate, regular rhythm and normal heart sounds.   Pulmonary/Chest: Effort normal and breath sounds normal.  Musculoskeletal: Normal range of motion.  Neurological: He is alert and oriented to person, place, and time.  Skin: Skin is warm and dry.    Urgent Care Course     Procedures (including critical care time)  Labs Review Labs Reviewed  POCT URINALYSIS DIP (DEVICE)    Imaging Review No results found.   Visual Acuity Review  Right Eye Distance:   Left Eye Distance:   Bilateral Distance:    Right Eye Near:   Left Eye Near:    Bilateral Near:         MDM   1. Possible exposure to STD   2. Dysuria     UA negative for acute infection. Patient endorses unprotected heterosexual encounter with a new partner approximately 1 week ago. We'll send specimen for gonorrhea, chlamydia and trichomonas. Will prophylactically treat for GC/chlamydia now and will notify patient of results of the above-mentioned tests. Patient provided an Rx for Pyridium for 3 days. Safe sex practices encouraged. Patient should follow-up with PCP or health department for complete STD testing.  Discussed diagnosis and treatment with patient. All questions have been answered and all concerns have been addressed. The patient verbalized understanding and had no further questions    Enrique Sack, [place] 12/02/16 [phone]

## 2016-12-03 LAB — URINE CYTOLOGY ANCILLARY ONLY
Chlamydia: NEGATIVE
NEISSERIA GONORRHEA: NEGATIVE
TRICH (WINDOWPATH): NEGATIVE

## 2016-12-27 ENCOUNTER — Other Ambulatory Visit: Payer: Self-pay | Admitting: Medical

## 2016-12-27 DIAGNOSIS — I10 Essential (primary) hypertension: Secondary | ICD-10-CM

## 2017-01-10 ENCOUNTER — Ambulatory Visit (INDEPENDENT_AMBULATORY_CARE_PROVIDER_SITE_OTHER): Payer: PRIVATE HEALTH INSURANCE | Admitting: Physician Assistant

## 2017-01-10 ENCOUNTER — Encounter (INDEPENDENT_AMBULATORY_CARE_PROVIDER_SITE_OTHER): Payer: Self-pay | Admitting: Physician Assistant

## 2017-01-10 VITALS — BP 139/91 | HR 94 | Temp 97.8°F | Wt 306.0 lb

## 2017-01-10 DIAGNOSIS — M25512 Pain in left shoulder: Secondary | ICD-10-CM

## 2017-01-10 DIAGNOSIS — I10 Essential (primary) hypertension: Secondary | ICD-10-CM

## 2017-01-10 DIAGNOSIS — G8929 Other chronic pain: Secondary | ICD-10-CM

## 2017-01-10 DIAGNOSIS — Z1159 Encounter for screening for other viral diseases: Secondary | ICD-10-CM | POA: Diagnosis not present

## 2017-01-10 DIAGNOSIS — E1141 Type 2 diabetes mellitus with diabetic mononeuropathy: Secondary | ICD-10-CM | POA: Diagnosis not present

## 2017-01-10 DIAGNOSIS — R059 Cough, unspecified: Secondary | ICD-10-CM

## 2017-01-10 DIAGNOSIS — R05 Cough: Secondary | ICD-10-CM | POA: Diagnosis not present

## 2017-01-10 LAB — POCT GLYCOSYLATED HEMOGLOBIN (HGB A1C): Hemoglobin A1C: 6.8

## 2017-01-10 MED ORDER — GUAIFENESIN ER 1200 MG PO TB12: 1.0000 | ORAL_TABLET | Freq: Two times a day (BID) | ORAL | 0 refills | Status: AC

## 2017-01-10 MED ORDER — BUDESONIDE 32 MCG/ACT NA SUSP: 2.0000 | Freq: Every day | NASAL | 0 refills | Status: DC

## 2017-01-10 MED ORDER — METFORMIN HCL 500 MG PO TABS: 500.0000 mg | ORAL_TABLET | Freq: Every day | ORAL | 1 refills | Status: DC

## 2017-01-10 MED ORDER — GABAPENTIN 400 MG PO CAPS: 400.0000 mg | ORAL_CAPSULE | Freq: Three times a day (TID) | ORAL | 1 refills | Status: DC

## 2017-01-10 MED ORDER — LISINOPRIL 40 MG PO TABS: 40.0000 mg | ORAL_TABLET | Freq: Every day | ORAL | 3 refills | Status: DC

## 2017-01-10 MED ORDER — AMOXICILLIN-POT CLAVULANATE 875-125 MG PO TABS: 1.0000 | ORAL_TABLET | Freq: Two times a day (BID) | ORAL | 0 refills | Status: AC

## 2017-01-10 MED ORDER — HYDROCHLOROTHIAZIDE 12.5 MG PO TABS: 12.5000 mg | ORAL_TABLET | Freq: Every day | ORAL | 3 refills | Status: DC

## 2017-01-10 MED ORDER — METFORMIN HCL 1000 MG PO TABS: 1000.0000 mg | ORAL_TABLET | Freq: Two times a day (BID) | ORAL | 3 refills | Status: DC

## 2017-01-10 MED ORDER — TRAMADOL HCL 50 MG PO TABS: 50.0000 mg | ORAL_TABLET | Freq: Three times a day (TID) | ORAL | 0 refills | Status: DC | PRN

## 2017-01-10 MED ORDER — LIDO-CAPSAICIN-MEN-METHYL SAL 0.5-0.035-5-20 % EX PTCH: 1.0000 | MEDICATED_PATCH | Freq: Every day | CUTANEOUS | 0 refills | Status: DC | PRN

## 2017-01-10 NOTE — Progress Notes (Signed)
Subjective:  Patient ID: Matthew Marus Sr., male    DOB: May 07, 1956  Age: 61 y.o. MRN: [id number]  CC: Multiple complaints  HPI Matthew Bob Sr. is a 61 y.o. male with a PMH of HTN, DM2, HLD, GERD, diabetic neuropathy presents to address all these conditions. Takes medications as prescribed.  Went to [facility] 2-3 weeks ago and had diabetic retinopathy screening done with no mention of diabetic retinopathy. Has vitreous floaters which has been discussed with his ophthalmologist. Has tingling of the feet 2/2 diabetes but is well controlled with Gabapentin. Does not endorse polydipsia, polyuria, polyphagia, visual blurring, or abdominal pain. Currently only taking Metformin 500 mg qday.     Left shoulder still painful. 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 He had been advised to return to [facility] for further treatment but patient has not returned. No reason given besides fear of having surgery. Requests pain medication for now and he is willing to be referred to another orthopedic office.    Complains of nasal drainage and cough for a few weeks now. Associated mild right ear discomfort. Worse at night. Has not taken anything for relief. No close contacts with the same. Denies f/c/n/v, rash, CP, palpitations, SOB, HA, abdominal pain, or GI/GU sxs.      ROS Review of Systems  Constitutional: Negative for chills, fever and malaise/fatigue.  HENT: Positive for congestion. Negative for ear pain, sinus pain and sore throat.   Eyes: Negative for blurred vision.       Vitreous floater  Respiratory: Negative for shortness of breath.   Cardiovascular: Negative for chest pain and palpitations.  Gastrointestinal: Negative for abdominal pain and nausea.  Genitourinary: Negative for dysuria and hematuria.  Musculoskeletal: Positive for joint pain. Negative for myalgias.  Skin: Negative for rash.  Neurological: Positive for tingling. Negative for headaches.   Psychiatric/Behavioral: Negative for depression. The patient is not nervous/anxious.     Objective:  BP (!) 139/91 (BP Location: Left Arm, Patient Position: Sitting, Cuff Size: Normal)   Pulse 94   Temp 97.8 F (36.6 C) (Oral)   Wt (!) 306 lb (138.8 kg)   SpO2 98%   BMI 42.68 kg/m   BP/Weight 01/10/2017 3/97/6734 07/13/3788  Systolic BP 240 973 532  Diastolic BP 91 74 83  Wt. (Lbs) 306 - 303.8  BMI 42.68 - 42.37      Physical Exam  Constitutional: He is oriented to person, place, and time.  Well developed, well nourished, NAD, polite  HENT:  Head: Normocephalic and atraumatic.  Tonsils 1+ and erythematous. Mild postnasal drip. Mild erythema of the TM bilaterally with mild right TM bulging. Turbinates mildly hypertrophic.  Eyes: No scleral icterus.  Neck: Normal range of motion. Neck supple.  Cardiovascular: Normal rate, regular rhythm and normal heart sounds.   Pulmonary/Chest: Effort normal and breath sounds normal. No respiratory distress. He has no wheezes. He has no rales.  Musculoskeletal: He exhibits no edema or deformity.  Left shoulder with pain on all provocative testing.  Lymphadenopathy:    He has no cervical adenopathy.  Neurological: He is alert and oriented to person, place, and time. No cranial nerve deficit. Coordination normal.  No loss of protective sensation of the feet with monofilament testing.  Skin: Skin is warm and dry. No rash noted. No erythema. No pallor.  Psychiatric: He has a normal mood and affect. His behavior is normal. Thought content normal.  Vitals reviewed.    Assessment & Plan:   1. Essential hypertension -  Refill lisinopril (PRINIVIL,ZESTRIL) 40 MG tablet; Take 1 tablet (40 mg total) by mouth daily.  Dispense: 90 tablet; Refill: 3 - Begin hydrochlorothiazide (HYDRODIURIL) 12.5 MG tablet; Take 1 tablet (12.5 mg total) by mouth daily.  Dispense: 90 tablet; Refill: 3  2. Type 2 diabetes mellitus with diabetic mononeuropathy, without  long-term current use of insulin (HCC) - HgB A1c 6.8% in clinic today - Refill gabapentin (NEURONTIN) 400 MG capsule; Take 1 capsule (400 mg total) by mouth 3 (three) times daily.  Dispense: 270 capsule; Refill: 1 - Increase metFORMIN (GLUCOPHAGE) 1000 MG tablet; Take 1 tablet (1,000 mg total) by mouth 2 (two) times daily with a meal.  Dispense: 180 tablet; Refill: 3 - Microalbumin/Creatinine Ratio, Urine  3. Chronic left shoulder pain - Ambulatory referral to Orthopedic Surgery - Begin  traMADol (ULTRAM) 50 MG tablet; Take 1 tablet (50 mg total) by mouth every 8 (eight) hours as needed.  Dispense: 30 tablet; Refill: 0 - Begin Lido-Capsaicin-Men-Methyl Sal 0.5-0.035-5-20 % PTCH; Apply 1 patch topically daily as needed.  Dispense: 30 patch; Refill: 0  4. Cough - Begin amoxicillin-clavulanate (AUGMENTIN) 875-125 MG tablet; Take 1 tablet by mouth 2 (two) times daily.  Dispense: 20 tablet; Refill: 0 - Begin Guaifenesin (MUCINEX MAXIMUM STRENGTH) 1200 MG TB12; Take 1 tablet (1,200 mg total) by mouth 2 (two) times daily.  Dispense: 10 tablet; Refill: 0 - Refill budesonide (RHINOCORT AQUA) 32 MCG/ACT nasal spray; Place 2 sprays into both nostrils daily.  Dispense: 8.43 mL; Refill: 0  5. Need for hepatitis C screening test - Hepatitis c antibody (reflex)   Meds ordered this encounter  Medications  . traMADol (ULTRAM) 50 MG tablet    Sig: Take 1 tablet (50 mg total) by mouth every 8 (eight) hours as needed.    Dispense:  30 tablet    Refill:  0    Order Specific Question:   Supervising Provider    Answer:   Tresa Garter [id number]  . lisinopril (PRINIVIL,ZESTRIL) 40 MG tablet    Sig: Take 1 tablet (40 mg total) by mouth daily.    Dispense:  90 tablet    Refill:  3    Order Specific Question:   Supervising Provider    Answer:   Tresa Garter [id number]  . DISCONTD: metFORMIN (GLUCOPHAGE) 500 MG tablet    Sig: Take 1 tablet (500 mg total) by mouth daily with breakfast.     Dispense:  90 tablet    Refill:  1    Order Specific Question:   Supervising Provider    Answer:   Tresa Garter [id number]  . gabapentin (NEURONTIN) 400 MG capsule    Sig: Take 1 capsule (400 mg total) by mouth 3 (three) times daily.    Dispense:  270 capsule    Refill:  1    Please consider 90 day supplies to promote better adherence    Order Specific Question:   Supervising Provider    Answer:   Tresa Garter [id number]  . metFORMIN (GLUCOPHAGE) 1000 MG tablet    Sig: Take 1 tablet (1,000 mg total) by mouth 2 (two) times daily with a meal.    Dispense:  180 tablet    Refill:  3    Cancel previous prescription for Metformin 500 mg please.    Order Specific Question:   Supervising Provider    Answer:   Tresa Garter [id number]  . amoxicillin-clavulanate (AUGMENTIN) 875-125 MG tablet    Sig: Take 1 tablet by  mouth 2 (two) times daily.    Dispense:  20 tablet    Refill:  0    Order Specific Question:   Supervising Provider    Answer:   Tresa Garter [id number]  . Guaifenesin (MUCINEX MAXIMUM STRENGTH) 1200 MG TB12    Sig: Take 1 tablet (1,200 mg total) by mouth 2 (two) times daily.    Dispense:  10 tablet    Refill:  0    Order Specific Question:   Supervising Provider    Answer:   Tresa Garter [id number]  . budesonide (RHINOCORT AQUA) 32 MCG/ACT nasal spray    Sig: Place 2 sprays into both nostrils daily.    Dispense:  8.43 mL    Refill:  0    Order Specific Question:   Supervising Provider    Answer:   Tresa Garter [id number]  . Lido-Capsaicin-Men-Methyl Sal 0.5-0.035-5-20 % PTCH    Sig: Apply 1 patch topically daily as needed.    Dispense:  30 patch    Refill:  0    Order Specific Question:   Supervising Provider    Answer:   Tresa Garter [id number]  . hydrochlorothiazide (HYDRODIURIL) 12.5 MG tablet    Sig: Take 1 tablet (12.5 mg total) by mouth daily.    Dispense:  90 tablet    Refill:  3    Order Specific Question:    Supervising Provider    Answer:   Tresa Garter [id number]    Follow-up: Return in about 3 months (around 04/12/2017) for DM2, HTN.   Clent Demark PA

## 2017-01-10 NOTE — Patient Instructions (Signed)

## 2017-01-13 LAB — MICROALBUMIN / CREATININE URINE RATIO
CREATININE, UR: 162 mg/dL
Microalb/Creat Ratio: 2.3 mg/g creat (ref 0.0–30.0)
Microalbumin, Urine: 3.8 ug/mL

## 2017-01-13 LAB — HEPATITIS C ANTIBODY (REFLEX)

## 2017-01-26 ENCOUNTER — Other Ambulatory Visit: Payer: Self-pay | Admitting: Medical

## 2017-02-02 ENCOUNTER — Ambulatory Visit (INDEPENDENT_AMBULATORY_CARE_PROVIDER_SITE_OTHER): Payer: Self-pay | Admitting: Orthopedic Surgery

## 2017-02-24 ENCOUNTER — Ambulatory Visit (INDEPENDENT_AMBULATORY_CARE_PROVIDER_SITE_OTHER): Payer: Self-pay | Admitting: Orthopedic Surgery

## 2017-03-03 ENCOUNTER — Telehealth (INDEPENDENT_AMBULATORY_CARE_PROVIDER_SITE_OTHER): Payer: Self-pay

## 2017-03-03 ENCOUNTER — Ambulatory Visit (INDEPENDENT_AMBULATORY_CARE_PROVIDER_SITE_OTHER): Payer: No Typology Code available for payment source | Admitting: Orthopedic Surgery

## 2017-03-03 ENCOUNTER — Encounter (INDEPENDENT_AMBULATORY_CARE_PROVIDER_SITE_OTHER): Payer: Self-pay | Admitting: Orthopedic Surgery

## 2017-03-03 ENCOUNTER — Ambulatory Visit (INDEPENDENT_AMBULATORY_CARE_PROVIDER_SITE_OTHER): Payer: No Typology Code available for payment source

## 2017-03-03 DIAGNOSIS — M25512 Pain in left shoulder: Secondary | ICD-10-CM

## 2017-03-03 DIAGNOSIS — G8929 Other chronic pain: Secondary | ICD-10-CM

## 2017-03-03 MED ORDER — HYDROCODONE-ACETAMINOPHEN 5-325 MG PO TABS: ORAL_TABLET | ORAL | 0 refills | Status: DC

## 2017-03-03 MED ORDER — TRAMADOL HCL 50 MG PO TABS: 50.0000 mg | ORAL_TABLET | Freq: Three times a day (TID) | ORAL | 0 refills | Status: DC | PRN

## 2017-03-03 NOTE — Telephone Encounter (Signed)
Patient calling stating his pharmacy won't fill his pain medicine until we give them a Dx code. He is asking for this to be asap cause he states he's in a lot of pain

## 2017-03-03 NOTE — Telephone Encounter (Signed)
Patient came in office and asked about this again, I called the [facility] on [address], and advised left shoulder pain is his actual diagnosis, and they say it is chronic if it will not be resolved within 3 month period then they have to call it chronic, this is per insurance and [facility] guidelines.  Per nurse notes from today this is a several year history of shoulder pain. Patient is aware that I s/w pharmacy.

## 2017-03-06 NOTE — Progress Notes (Signed)
Office Visit Note   Patient: Matthew Nichelson Sr.           Date of Birth: 09/15/1955           MRN: [id number] Visit Date: 03/03/2017 Requested by: Clent Demark, PA-C [place], [place] [address] PCP: Clent Demark, PA-C  Subjective: Chief Complaint  Patient presents with  . Left Shoulder - Pain    HPI: Matthew Colon is a 61 year old patient with left shoulder pain for years.  Patient states it is getting worse.  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  He does use his left arm for this process.  He feels like his shoulder is shifting and moving with certain motions.  He is right-hand-dominant.  This will wake him from sleep at night due to pain.  He takes ibuprofen and Neurontin for the problem.  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              ROS: All systems reviewed are negative as they relate to the chief complaint within the history of present illness.  Patient denies  fevers or chills.   Assessment & Plan: Visit Diagnoses:  1. Chronic left shoulder pain     Plan: Impression is in the stage glenohumeral arthritis left shoulder with reasonable rotator cuff strength.  Posterior glenoid wear is present.  Plan is one time prescription for tramadol to be taken during the day Norco to be taken at night.  These prescriptions should not be refilled.  I did give him surgery scheduler's card if he wants to proceed with shoulder replacement.  Discussed the risks and benefits with him at length including not limited to infection nerve and vessel damage potential need for revision.  Patient understands the risks and benefits and will consider his options.  I will see him back as needed  Follow-Up Instructions: Return if symptoms worsen or fail to improve.   Orders:  Orders Placed This Encounter  Procedures  . XR Shoulder Left   Meds ordered this encounter  Medications  . traMADol (ULTRAM) 50 MG tablet    Sig: Take 1 tablet (50 mg total) by mouth  every 8 (eight) hours as needed.    Dispense:  75 tablet    Refill:  0  . HYDROcodone-acetaminophen (NORCO/VICODIN) 5-325 MG tablet    Sig: 1 po q d prn pain    Dispense:  45 tablet    Refill:  0      Procedures: No procedures performed   Clinical Data: No additional findings.  Objective: Vital Signs: There were no vitals taken for this visit.  Physical Exam:   Constitutional: Patient appears well-developed HEENT:  Head: Normocephalic Eyes:EOM are normal Neck: Normal range of motion Cardiovascular: Normal rate Pulmonary/chest: Effort normal Neurologic: Patient is alert Skin: Skin is warm Psychiatric: Patient has normal mood and affect    Ortho Exam: Orthopedic exam demonstrates palpable radial pulses bilaterally 5 out of 5 grip EPL FPL interosseous wrist flexion-extension biceps triceps and deltoid strength.  He does have reasonable rotator cuff strength to infraspinatus supraspinatus and subscap testing on the left.  He does have restriction of external rotation due to the arthritis to about 20-25.  Forward flexion and abduction close to 90.  Specialty Comments:  No specialty comments available.  Imaging: No results found.   PMFS History: Patient Active Problem List   Diagnosis Date Noted  . Elevated PSA 12/05/2015  . Noncompliance 12/05/2015  .  Essential hypertension 03/14/2015  . Type 2 diabetes mellitus with diabetic mononeuropathy ([place]) 03/14/2015  . Encounter for health maintenance examination in adult 03/14/2015  . Screening for prostate cancer 03/14/2015  . Special screening for malignant neoplasms, colon 03/14/2015  . Hyperlipidemia 03/14/2015  . Nocturia 03/14/2015  . Urinary frequency 03/14/2015  . Foot pain, bilateral 03/14/2015  . Need for prophylactic vaccination and inoculation against influenza 03/14/2015  . Need for prophylactic vaccination against Streptococcus pneumoniae (pneumococcus) 03/14/2015  . Need for Tdap vaccination 03/14/2015    . Chronic nausea 03/14/2015  . Gastroesophageal reflux disease without esophagitis 03/14/2015   Past Medical History:  Diagnosis Date  . Allergy   . Foot pain, bilateral    referred to podiatry 06/2014  . GERD (gastroesophageal reflux disease)    long history of  . Hyperlipidemia   . Hypertension   . Polyarthralgia    shoulders, knees, ankles, wrists; neg rheum lab screen 04/2014  . Type 2 diabetes mellitus with diabetic neuropathy ([place])   . Wears glasses     Family History  Problem Relation Age of Onset  . Diabetes Mother   . Hypertension Mother   . Diabetes Sister   . Arthritis Sister   . Heart disease Sister   . Other Father        murdered  . Diabetes Sister   . Diabetes Sister   . Cancer Neg Hx   . Stroke Neg Hx     Past Surgical History:  Procedure Laterality Date  . COLONOSCOPY     referral pending 03/2015  . NO PAST SURGERIES     as of 03/2015   Social History   Occupational History  . Not on file.   Social History Main Topics  . Smoking status: Never Smoker  . Smokeless tobacco: Never Used  . Alcohol use Yes     Comment: occasional beer  . Drug use: No  . Sexual activity: Not on file

## 2017-03-09 ENCOUNTER — Telehealth (INDEPENDENT_AMBULATORY_CARE_PROVIDER_SITE_OTHER): Payer: Self-pay | Admitting: Orthopedic Surgery

## 2017-03-09 DIAGNOSIS — Z7689 Persons encountering health services in other specified circumstances: Secondary | ICD-10-CM

## 2017-03-09 NOTE — Telephone Encounter (Signed)
Referral made 

## 2017-03-09 NOTE — Telephone Encounter (Signed)
y

## 2017-03-09 NOTE — Telephone Encounter (Signed)
Ok for referral?

## 2017-03-09 NOTE — Telephone Encounter (Signed)
Patient called left voicemail message asking to be referred to a pain clinic. The number to contact patient is [phone]

## 2017-03-30 ENCOUNTER — Encounter: Payer: Self-pay | Admitting: Nurse Practitioner

## 2017-03-30 ENCOUNTER — Other Ambulatory Visit
Admission: RE | Admit: 2017-03-30 | Discharge: 2017-03-30 | Disposition: A | Discharge status: Home / Self Care | Payer: No Typology Code available for payment source | Source: Ambulatory Visit | Attending: Nurse Practitioner | Admitting: Nurse Practitioner

## 2017-03-30 ENCOUNTER — Ambulatory Visit: Payer: No Typology Code available for payment source | Attending: Nurse Practitioner | Admitting: Nurse Practitioner

## 2017-03-30 VITALS — BP 130/76 | HR 92 | Temp 97.7°F | Resp 16 | Ht 71.0 in | Wt 287.0 lb

## 2017-03-30 DIAGNOSIS — Z833 Family history of diabetes mellitus: Secondary | ICD-10-CM | POA: Insufficient documentation

## 2017-03-30 DIAGNOSIS — R972 Elevated prostate specific antigen [PSA]: Secondary | ICD-10-CM | POA: Insufficient documentation

## 2017-03-30 DIAGNOSIS — Z7984 Long term (current) use of oral hypoglycemic drugs: Secondary | ICD-10-CM | POA: Diagnosis not present

## 2017-03-30 DIAGNOSIS — E1141 Type 2 diabetes mellitus with diabetic mononeuropathy: Secondary | ICD-10-CM | POA: Insufficient documentation

## 2017-03-30 DIAGNOSIS — Z789 Other specified health status: Secondary | ICD-10-CM | POA: Insufficient documentation

## 2017-03-30 DIAGNOSIS — M25512 Pain in left shoulder: Secondary | ICD-10-CM | POA: Diagnosis not present

## 2017-03-30 DIAGNOSIS — Z7951 Long term (current) use of inhaled steroids: Secondary | ICD-10-CM | POA: Insufficient documentation

## 2017-03-30 DIAGNOSIS — M25562 Pain in left knee: Secondary | ICD-10-CM | POA: Diagnosis not present

## 2017-03-30 DIAGNOSIS — Z8249 Family history of ischemic heart disease and other diseases of the circulatory system: Secondary | ICD-10-CM | POA: Diagnosis not present

## 2017-03-30 DIAGNOSIS — Z8261 Family history of arthritis: Secondary | ICD-10-CM | POA: Diagnosis not present

## 2017-03-30 DIAGNOSIS — Z79899 Other long term (current) drug therapy: Secondary | ICD-10-CM | POA: Insufficient documentation

## 2017-03-30 DIAGNOSIS — Z7409 Other reduced mobility: Secondary | ICD-10-CM | POA: Insufficient documentation

## 2017-03-30 DIAGNOSIS — M79602 Pain in left arm: Secondary | ICD-10-CM

## 2017-03-30 DIAGNOSIS — G894 Chronic pain syndrome: Secondary | ICD-10-CM

## 2017-03-30 DIAGNOSIS — K219 Gastro-esophageal reflux disease without esophagitis: Secondary | ICD-10-CM | POA: Insufficient documentation

## 2017-03-30 DIAGNOSIS — M79601 Pain in right arm: Secondary | ICD-10-CM

## 2017-03-30 DIAGNOSIS — Z7982 Long term (current) use of aspirin: Secondary | ICD-10-CM | POA: Insufficient documentation

## 2017-03-30 DIAGNOSIS — G8929 Other chronic pain: Secondary | ICD-10-CM | POA: Insufficient documentation

## 2017-03-30 DIAGNOSIS — M899 Disorder of bone, unspecified: Secondary | ICD-10-CM | POA: Insufficient documentation

## 2017-03-30 DIAGNOSIS — I10 Essential (primary) hypertension: Secondary | ICD-10-CM | POA: Diagnosis not present

## 2017-03-30 DIAGNOSIS — M79672 Pain in left foot: Secondary | ICD-10-CM | POA: Insufficient documentation

## 2017-03-30 DIAGNOSIS — E785 Hyperlipidemia, unspecified: Secondary | ICD-10-CM | POA: Diagnosis not present

## 2017-03-30 DIAGNOSIS — F1190 Opioid use, unspecified, uncomplicated: Secondary | ICD-10-CM | POA: Diagnosis not present

## 2017-03-30 DIAGNOSIS — Z79891 Long term (current) use of opiate analgesic: Secondary | ICD-10-CM | POA: Insufficient documentation

## 2017-03-30 DIAGNOSIS — M79671 Pain in right foot: Secondary | ICD-10-CM | POA: Insufficient documentation

## 2017-03-30 LAB — COMPREHENSIVE METABOLIC PANEL
ALBUMIN: 3.8 g/dL (ref 3.5–5.0)
ALT: 10 U/L — ABNORMAL LOW (ref 17–63)
ANION GAP: 10 (ref 5–15)
AST: 26 U/L (ref 15–41)
Alkaline Phosphatase: 53 U/L (ref 38–126)
BUN: 21 mg/dL — ABNORMAL HIGH (ref 6–20)
CHLORIDE: 108 mmol/L (ref 101–111)
CO2: 25 mmol/L (ref 22–32)
Calcium: 9.7 mg/dL (ref 8.9–10.3)
Creatinine, Ser: 1.11 mg/dL (ref 0.61–1.24)
GFR calc Af Amer: >60 mL/min (ref >60)
GFR calc non Af Amer: >60 mL/min (ref >60)
GLUCOSE: 108 mg/dL — AB (ref 65–99)
POTASSIUM: 4.1 mmol/L (ref 3.5–5.1)
SODIUM: 143 mmol/L (ref 135–145)
TOTAL PROTEIN: 7.1 g/dL (ref 6.5–8.1)
Total Bilirubin: 0.5 mg/dL (ref 0.3–1.2)

## 2017-03-30 LAB — SEDIMENTATION RATE: SED RATE: 31 mm/h — AB (ref 0–20)

## 2017-03-30 LAB — VITAMIN B12: Vitamin B-12: 244 pg/mL (ref 180–914)

## 2017-03-30 LAB — MAGNESIUM: MAGNESIUM: 1.3 mg/dL — AB (ref 1.7–2.4)

## 2017-03-30 LAB — C-REACTIVE PROTEIN

## 2017-03-30 NOTE — Patient Instructions (Addendum)
____________________________________________________________________________________________  Appointment Policy Summary  It is our goal and responsibility to provide the medical community with assistance in the evaluation and management of patients with chronic pain. Unfortunately our resources are limited. Because we do not have an unlimited amount of time, or available appointments, we are required to closely monitor and manage their use. The following rules exist to maximize their use:  Patient's responsibilities: 1. Punctuality:  At what time should I arrive? You should be physically present in our office 30 minutes before your scheduled appointment. Your scheduled appointment is with your assigned healthcare provider. However, it takes 5-10 minutes to be "checked-in", and another 15 minutes for the nurses to do the admission. If you arrive to our office at the time you were given for your appointment, you will end up being at least 20-25 minutes late to your appointment with the provider. 2. Tardiness:  What happens if I arrive only a few minutes after my scheduled appointment time? You will need to reschedule your appointment. The cutoff is your appointment time. This is why it is so important that you arrive at least 30 minutes before that appointment. If you have an appointment scheduled for 10:00 AM and you arrive at 10:01, you will be required to reschedule your appointment.  3. Plan ahead:  Always assume that you will encounter traffic on your way in. Plan for it. If you are dependent on a driver, make sure they understand these rules and the need to arrive early. 4. Other appointments and responsibilities:  Avoid scheduling any other appointments before or after your pain clinic appointments.  5. Be prepared:  Write down everything that you need to discuss with your healthcare provider and give this information to the admitting nurse. Write down the medications that you will need  refilled. Bring your pills and bottles (even the empty ones), to all of your appointments, except for those where a procedure is scheduled. 6. No children or pets:  Find someone to take care of them. It is not appropriate to bring them in. 7. Scheduling changes:  We request "advanced notification" of any changes or cancellations. 8. Advanced notification:  Defined as a time period of more than 24 hours prior to the originally scheduled appointment. This allows for the appointment to be offered to other patients. 9. Rescheduling:  When a visit is rescheduled, it will require the cancellation of the original appointment. For this reason they both fall within the category of "Cancellations".  10. Cancellations:  They require advanced notification. Any cancellation less than 24 hours before the  appointment will be recorded as a "No Show". 11. No Show:  Defined as an unkept appointment where the patient failed to notify or declare to the practice their intention or inability to keep the appointment.  Corrective process for repeat offenders:  1. Tardiness: Three (3) episodes of rescheduling due to late arrivals will be recorded as one (1) "No Show". 2. Cancellation or reschedule: Three (3) cancellations or rescheduling will be recorded as one (1) "No Show". 3. "No Shows": Three (3) "No Shows" within a 12 month period will result in discharge from the practice.  ____________________________________________________________________________________________  ____________________________________________________________________________________________  Pain Scale  Introduction: The pain score used by this practice is the Verbal Numerical Rating Scale (VNRS-11). This is an 11-point scale. It is for adults and children 10 years or older. There are significant differences in how the pain score is reported, used, and applied. Forget everything you learned in the past and  learn this scoring  system.  General Information: The scale should reflect your current level of pain. Unless you are specifically asked for the level of your worst pain, or your average pain. If you are asked for one of these two, then it should be understood that it is over the past 24 hours.  Basic Activities of Daily Living (ADL): Personal hygiene, dressing, eating, transferring, and using restroom.  Instructions: Most patients tend to report their level of pain as a combination of two factors, their physical pain and their psychosocial pain. This last one is also known as "suffering" and it is reflection of how physical pain affects you socially and psychologically. From now on, report them separately. From this point on, when asked to report your pain level, report only your physical pain. Use the following table for reference.  Pain Clinic Pain Levels (0-5/10)  Pain Level Score  Description  No Pain 0   Mild pain 1 Nagging, annoying, but does not interfere with basic activities of daily living (ADL). Patients are able to eat, bathe, get dressed, toileting (being able to get on and off the toilet and perform personal hygiene functions), transfer (move in and out of bed or a chair without assistance), and maintain continence (able to control bladder and bowel functions). Blood pressure and heart rate are unaffected. A normal heart rate for a healthy adult ranges from 60 to 100 bpm (beats per minute).   Mild to moderate pain 2 Noticeable and distracting. Impossible to hide from other people. More frequent flare-ups. Still possible to adapt and function close to normal. It can be very annoying and may have occasional stronger flare-ups. With discipline, patients may get used to it and adapt.   Moderate pain 3 Interferes significantly with activities of daily living (ADL). It becomes difficult to feed, bathe, get dressed, get on and off the toilet or to perform personal hygiene functions. Difficult to get in and out of  bed or a chair without assistance. Very distracting. With effort, it can be ignored when deeply involved in activities.   Moderately severe pain 4 Impossible to ignore for more than a few minutes. With effort, patients may still be able to manage work or participate in some social activities. Very difficult to concentrate. Signs of autonomic nervous system discharge are evident: dilated pupils (mydriasis); mild sweating (diaphoresis); sleep interference. Heart rate becomes elevated (>115 bpm). Diastolic blood pressure (lower number) rises above 100 mmHg. Patients find relief in laying down and not moving.   Severe pain 5 Intense and extremely unpleasant. Associated with frowning face and frequent crying. Pain overwhelms the senses.  Ability to do any activity or maintain social relationships becomes significantly limited. Conversation becomes difficult. Pacing back and forth is common, as getting into a comfortable position is nearly impossible. Pain wakes you up from deep sleep. Physical signs will be obvious: pupillary dilation; increased sweating; goosebumps; brisk reflexes; cold, clammy hands and feet; nausea, vomiting or dry heaves; loss of appetite; significant sleep disturbance with inability to fall asleep or to remain asleep. When persistent, significant weight loss is observed due to the complete loss of appetite and sleep deprivation.  Blood pressure and heart rate becomes significantly elevated. Caution: If elevated blood pressure triggers a pounding headache associated with blurred vision, then the patient should immediately seek attention at an urgent or emergency care unit, as these may be signs of an impending stroke.    Emergency Department Pain Levels (6-10/10)  Emergency Room Pain 6   Severely limiting. Requires emergency care and should not be seen or managed at an outpatient pain management facility. Communication becomes difficult and requires great effort. Assistance to reach the  emergency department may be required. Facial flushing and profuse sweating along with potentially dangerous increases in heart rate and blood pressure will be evident.   Distressing pain 7 Self-care is very difficult. Assistance is required to transport, or use restroom. Assistance to reach the emergency department will be required. Tasks requiring coordination, such as bathing and getting dressed become very difficult.   Disabling pain 8 Self-care is no longer possible. At this level, pain is disabling. The individual is unable to do even the most "basic" activities such as walking, eating, bathing, dressing, transferring to a bed, or toileting. Fine motor skills are lost. It is difficult to think clearly.   Incapacitating pain 9 Pain becomes incapacitating. Thought processing is no longer possible. Difficult to remember your own name. Control of movement and coordination are lost.   The worst pain imaginable 10 At this level, most patients pass out from pain. When this level is reached, collapse of the autonomic nervous system occurs, leading to a sudden drop in blood pressure and heart rate. This in turn results in a temporary and dramatic drop in blood flow to the brain, leading to a loss of consciousness. Fainting is one of the body's self defense mechanisms. Passing out puts the brain in a calmed state and causes it to shut down for a while, in order to begin the healing process.    Summary: 1. Refer to this scale when providing [place] with your pain level. 2. Be accurate and careful when reporting your pain level. This will help with your care. 3. Over-reporting your pain level will lead to loss of credibility. 4. Even a level of 1/10 means that there is pain and will be treated at our facility. 5. High, inaccurate reporting will be documented as "Symptom Exaggeration", leading to loss of credibility and suspicions of possible secondary gains such as obtaining more narcotics, or wanting to appear  disabled, for fraudulent reasons. 6. Only pain levels of 5 or below will be seen at our facility. 7. Pain levels of 6 and above will be sent to the Emergency Department and the appointment cancelled. ____________________________________________________________________________________________   BMI Assessment: Estimated body mass index is 40.03 kg/m as calculated from the following:   Height as of this encounter: 5\' 11"  (1.803 m).   Weight as of this encounter: 287 lb (130.2 kg).  BMI interpretation table: BMI level Category Range association with higher incidence of chronic pain  <18 kg/m2 Underweight   18.5-24.9 kg/m2 Ideal body weight   25-29.9 kg/m2 Overweight Increased incidence by 20%  30-34.9 kg/m2 Obese (Class I) Increased incidence by 68%  35-39.9 kg/m2 Severe obesity (Class II) Increased incidence by 136%  >40 kg/m2 Extreme obesity (Class III) Increased incidence by 254%   BMI Readings from Last 4 Encounters:  03/30/17 40.03 kg/m  01/10/17 42.68 kg/m  09/06/16 42.37 kg/m  12/05/15 38.77 kg/m   Wt Readings from Last 4 Encounters:  03/30/17 287 lb (130.2 kg)  01/10/17 (!) 306 lb (138.8 kg)  09/06/16 (!) 303 lb 12.8 oz (137.8 kg)  12/05/15 278 lb (126.1 kg)

## 2017-03-30 NOTE — Progress Notes (Signed)
Patient's Name: Matthew Mah Sr.  MRN: [id number]  Referring Provider: Clent Demark, PA-C  DOB: 04/03/1956  PCP: Clent Demark, PA-C  DOS: 03/30/2017  Note by: Dionisio David NP  Service setting: Ambulatory outpatient  Specialty: Interventional Pain Management  Location: [facility] (AMB) Pain Management Facility    Patient type: New Patient    Primary Reason(s) for Visit: Initial Patient Evaluation CC: Shoulder Pain (left)  HPI  Matthew Colon is a 61 y.o. year old, male patient, who comes today for an initial evaluation. He has Essential hypertension; Type 2 diabetes mellitus with diabetic mononeuropathy ([place]); Encounter for health maintenance examination in adult; Screening for prostate cancer; Special screening for malignant neoplasms, colon; Hyperlipidemia; Nocturia; Urinary frequency; Foot pain, bilateral; Need for prophylactic vaccination and inoculation against influenza; Need for prophylactic vaccination against Streptococcus pneumoniae (pneumococcus); Need for Tdap vaccination; Chronic nausea; Gastroesophageal reflux disease without esophagitis; Elevated PSA; Noncompliance; Chronic left shoulder pain (Primary Area of Pain); Chronic pain of left knee [facility] Area of Pain); Disorder of bone, unspecified; Other reduced mobility; Other specified health status; Long term current use of opiate analgesic; Opiate use; Chronic pain syndrome; and Chronic upper extremity pain (Secondary Area of Pain)(left) on his problem list.. His primarily concern today is the Shoulder Pain (left)  Pain Assessment: Location: Left Shoulder Radiating: down the left arm to the hand Onset: More than a month ago Duration: Chronic pain Quality: Tingling, Numbness, Other (Comment) (weakness, feels as if it pops out of the joint.  limited ROM) Severity: 7 /10 (self-reported pain score)  Note: Reported level is compatible with observation.                    Effect on ADL: fork lift operator, uses the left hand to  steer, the pain causes difficulty with his daily work duties.  Timing: Constant Modifying factors: pain medications did help  Onset and Duration: Date of onset: 6 years Cause of pain: Unknown Severity: Getting worse, No change since onset and NAS-11 on the average: 10/10 Timing: Night and During activity or exercise Aggravating Factors: Motion Alleviating Factors: none listed Associated Problems: Day-time cramps, Night-time cramps, Tingling, Weakness, Pain that wakes patient up and Pain that does not allow patient to sleep Quality of Pain: Aching, Sharp, Tingling, Uncomfortable and Work related Previous Examinations or Tests: The patient denies nothing listed Previous Treatments: Trigger point injections  The patient comes into the clinics today for the first time for a chronic pain management evaluation. According to the patient his primary area of pain is in his left shoulder.he has been dealing with the pain for years and he admits that standing worse.he denies any specific accident or injury He does work as a Games developer currently. He denies any previous surgeries. He has had some interventional therapy, steroid injection a few months ago. He admits the injection last several months. He completed images in [place]. He admits that his last physical therapy was 7-8 years ago which was not effective. He has been seen or toe and was told that he needs surgery. He is not interested in surgery at this time. He has occasional right shoulder pain but not significant.  His second area of pain is in his upper extremities. He admits that he has numbness tingling and weakness that goes down his arm into fingers 4 and 5.  His third area of pain is in his left knee. He denies any previous surgeries. He has had steroid injections in the past which  were effective. He has some weakness denies any swelling. He denies any recent images.  Today I took the time to provide the patient with information  regarding this pain practice. The patient was informed that the practice is divided into two sections: an interventional pain management section, as well as a completely separate and distinct medication management section. I explained that there are procedure days for interventional therapies, and evaluation days for follow-ups and medication management. Because of the amount of documentation required during both, they are kept separated. This means that there is the possibility that he may be scheduled for a procedure on one day, and medication management the next. I have also informed him that because of staffing and facility limitations, this practice will no longer take patients for medication management only. To illustrate the reasons for this, I gave the patient the example of surgeons, and how inappropriate it would be to refer a patient to his care, just to write for the post-surgical antibiotics on a surgery done by a different surgeon.   Because interventional pain management is part of the board-certified specialty for the doctors, the patient was informed that joining this practice means that they are open to any and all interventional therapies. I made it clear that this does not mean that they will be forced to have any procedures done. What this means is that I believe interventional therapies to be essential part of the diagnosis and proper management of chronic pain conditions. Therefore, patients not interested in these interventional alternatives will be better served under the care of a different practitioner.  The patient was also made aware of my Comprehensive Pain Management Safety Guidelines where by joining this practice, they limit all of their nerve blocks and joint injections to those done by our practice, for as long as we are retained to manage their care. Historic Controlled Substance Pharmacotherapy Review  PMP and historical list of controlled substances:  hydrocodone/acetaminophen 5/325 mg, tramadol 50 mg, Highest opioid analgesic regimen found: tramadol 50 mg 4 times daily (last fill date 03/03/2017) tramadol 200 mg+ hydrocodone/acetaminophen 5/325 one tablet daily (fill date 03/03/2017) hydrocodone 5 mg per day. Most recent opioid analgesic:  tramadol 50 mg 4 times daily (last fill date 03/03/2017) tramadol 200 mg.+ hydrocodone/acetaminophen 5/325 one tablet daily (fill date 03/03/2017) hydrocodone 5 mg per day Current opioid analgesics:tramadol 50 mg 4 times daily (last fill date 03/03/2017) tramadol 200 mg.+ hydrocodone/acetaminophen 5/325 one tablet daily (fill date 03/03/2017) hydrocodone 5 mg per day Highest recorded MME/day: 25 mg/day MME/day: 25 mg/day Medications: The patient did not bring the medication(s) to the appointment, as requested in our "New Patient Package" Pharmacodynamics: Desired effects: Analgesia: The patient reports >50% benefit. Reported improvement in function: The patient reports medication allows him to accomplish basic ADLs. Clinically meaningful improvement in function (CMIF): Sustained CMIF goals met Perceived effectiveness: Described as relatively effective, allowing for increase in activities of daily living (ADL) Undesirable effects: Side-effects or Adverse reactions: None reported Historical Monitoring: The patient  reports that he does not use drugs. List of all UDS Test(s): No results found for: MDMA, COCAINSCRNUR, PCPSCRNUR, PCPQUANT, CANNABQUANT, THCU, [place] List of all Serum Drug Screening Test(s):  No results found for: AMPHSCRSER, BARBSCRSER, BENZOSCRSER, COCAINSCRSER, PCPSCRSER, PCPQUANT, THCSCRSER, CANNABQUANT, OPIATESCRSER, OXYSCRSER, PROPOXSCRSER Historical Background Evaluation: [place] PDMP: Six (6) year initial data search conducted.             [place] Department of public safety, offender search: Editor, commissioning Information) Non-contributory Risk Assessment Profile: Aberrant  behavior: None observed or  detected today Risk factors for fatal opioid overdose: male gender Fatal overdose hazard ratio (HR): 1.32 for 20-49 MME/day Non-fatal overdose hazard ratio (HR): 1.44 for 20-49 MME/day Risk of opioid abuse or dependence: 0.7-3.0% with doses ? 36 MME/day and 6.1-26% with doses ? 120 MME/day. Substance use disorder (SUD) risk level: Pending results of Medical Psychology Evaluation for SUD Opioid risk tool (ORT) (Total Score): 0  ORT Scoring interpretation table:  Score <3 = Low Risk for SUD  Score between 4-7 = Moderate Risk for SUD  Score >8 = High Risk for Opioid Abuse   PHQ-2 Depression Scale:  Total score: 0  PHQ-2 Scoring interpretation table: (Score and probability of major depressive disorder)  Score 0 = No depression  Score 1 = 15.4% Probability  Score 2 = 21.1% Probability  Score 3 = 38.4% Probability  Score 4 = 45.5% Probability  Score 5 = 56.4% Probability  Score 6 = 78.6% Probability   PHQ-9 Depression Scale:  Total score: 0  PHQ-9 Scoring interpretation table:  Score 0-4 = No depression  Score 5-9 = Mild depression  Score 10-14 = Moderate depression  Score 15-19 = Moderately severe depression  Score 20-27 = Severe depression (2.4 times higher risk of SUD and 2.89 times higher risk of overuse)   Pharmacologic Plan: Pending ordered tests and/or consults  Meds  The patient has a current medication list which includes the following prescription(s): aspirin ec, budesonide, cetirizine, docusate sodium, gabapentin, hydrochlorothiazide, lisinopril, metformin, and tadalafil.  Current Outpatient Prescriptions on File Prior to Visit  Medication Sig  . aspirin EC 81 MG tablet Take 1 tablet (81 mg total) by mouth daily.  . budesonide (RHINOCORT AQUA) 32 MCG/ACT nasal spray Place 2 sprays into both nostrils daily.  . cetirizine (ZYRTEC) 10 MG tablet Take 1 tablet (10 mg total) by mouth daily.  Marland Kitchen docusate sodium (COLACE) 100 MG capsule Take 200 mg by mouth daily as needed for  mild constipation.  . gabapentin (NEURONTIN) 400 MG capsule Take 1 capsule (400 mg total) by mouth 3 (three) times daily.  . hydrochlorothiazide (HYDRODIURIL) 12.5 MG tablet Take 1 tablet (12.5 mg total) by mouth daily.  Marland Kitchen lisinopril (PRINIVIL,ZESTRIL) 40 MG tablet Take 1 tablet (40 mg total) by mouth daily.  . metFORMIN (GLUCOPHAGE) 1000 MG tablet Take 1 tablet (1,000 mg total) by mouth 2 (two) times daily with a meal.  . tadalafil (CIALIS) 20 MG tablet Take 1 tablet (20 mg total) by mouth daily as needed for erectile dysfunction.   No current facility-administered medications on file prior to visit.    Imaging Review  Cervical Imaging:  Cervical DG complete:  Results for orders placed during the hospital encounter of 03/03/11  DG Cervical Spine Complete   Narrative *RADIOLOGY REPORT*  Clinical Data: Neck pain radiating to both shoulders.  CERVICAL SPINE - COMPLETE 4+ VIEW  Comparison: None.  Findings: No evidence of acute fracture, subluxation, or prevertebral soft tissue swelling.  Mild degenerative disc disease is seen at levels of C3-4, C5-6, and C6-7.  No other significant bone abnormality identified.  IMPRESSION:  1.  No acute findings. 2.  Mild degenerative disc disease at C3-4, C5-6, and C6-7.  Original Report Authenticated By: Marlaine Hind, M.D.   Shoulder Imaging:  Shoulder-R DG:  Results for orders placed during the hospital encounter of 03/03/11  DG Shoulder Right   Narrative *RADIOLOGY REPORT*  Clinical Data: Right shoulder pain.  RIGHT SHOULDER - 2+ VIEW  Comparison: None.  Findings: No evidence of fracture or dislocation.  Mild degenerative spurring of the acromioclavicular joint is seen, but no other significant bone abnormality identified.  Soft tissues are unremarkable.  IMPRESSION:  1.  No acute findings. 2.  Minimal acromioclavicular degenerative spurring.  Original Report Authenticated By: Marlaine Hind, M.D.   Shoulder-L DG:   Results for orders placed during the hospital encounter of 03/03/11  DG Shoulder Left   Narrative *RADIOLOGY REPORT*  Clinical Data: Left shoulder pain  LEFT SHOULDER - 2+ VIEW  Comparison: None.  Findings: No evidence of fracture or dislocation.  Moderate osteoarthritis is seen involve the glenohumeral joint. Mild degenerative spurring is also seen involving the acromioclavicular joint.  No other bone lesions identified.  The soft tissues are unremarkable.  IMPRESSION:  1.  No acute findings. 2.  Moderate glenohumeral osteoarthritis. 3.  Mild acromioclavicular degenerative changes.  Original Report Authenticated By: Marlaine Hind, M.D.   Knee Imaging: Knee-R DG 1-2 views:  Results for orders placed during the hospital encounter of 11/19/09  DG Knee 2 Views Right   Narrative Clinical Data: Knee pain.  No trauma.   RIGHT KNEE - 1-2 VIEW   Comparison: None.   Findings:  Tricompartment degenerative changes with joint space narrowing most notable and mild to slightly moderate in degree involving the medial tibial femoral joint space. Small to moderate sized suprapatellar joint effusion.  No plain film evidence of osteo necrosis.   IMPRESSION: Degenerative changes most notable medial tibiofemoral joint space with joint effusion.  Provider: Nancy Nordmann   Knee-L DG 1-2 views:  Results for orders placed during the hospital encounter of 11/19/09  DG Knee 2 Views Left   Narrative Clinical Data: Knee pain.  No trauma.   LEFT KNEE - 1-2 VIEW   Comparison: None.   Findings: Minimal tricompartment degenerative changes without plain film evidence of joint effusion. No plain film evidence of osteo necrosis.   IMPRESSION: Minimal tricompartment degenerative changes.  Provider: Nancy Nordmann   Knee-R DG 4 views:  Results for orders placed during the hospital encounter of 12/21/12  DG Knee Complete 4 Views Right   Narrative *RADIOLOGY REPORT*  Clinical Data: Knee  pain  RIGHT KNEE - COMPLETE 4+ VIEW  Comparison: Prior radiographs of the right knee 11/19/2009  Findings: No acute fracture or malalignment.  There is a suprapatellar joint effusion which is slightly larger than seen on the prior radiographs.  Normal bony mineralization.  Mild degenerative changes with narrowing of the medial joint space and peaking of the tibial plateaus.  No significant interval progression compared to prior.  IMPRESSION:  1.  No acute fracture or malalignment. 2.  Moderate suprapatellar joint effusion. 3.  Degenerative changes without significant interval progression compared to May 2011.   Original Report Authenticated By: Jacqulynn Cadet, M.D.    Note: Available results from prior imaging studies were reviewed.        ROS  Cardiovascular History: High blood pressure Pulmonary or Respiratory History: Snoring  and Temporary stoppage of breathing during sleep Neurological History: No reported neurological signs or symptoms such as seizures, abnormal skin sensations, urinary and/or fecal incontinence, being born with an abnormal open spine and/or a tethered spinal cord Review of Past Neurological Studies: No results found for this or any previous visit. Psychological-Psychiatric History: No reported psychological or psychiatric signs or symptoms such as difficulty sleeping, anxiety, depression, delusions or hallucinations (schizophrenial), mood swings (bipolar disorders) or suicidal ideations or attempts Gastrointestinal History: No  reported gastrointestinal signs or symptoms such as vomiting or evacuating blood, reflux, heartburn, alternating episodes of diarrhea and constipation, inflamed or scarred liver, or pancreas or irrregular and/or infrequent bowel movements Genitourinary History: No reported renal or genitourinary signs or symptoms such as difficulty voiding or producing urine, peeing blood, non-functioning kidney, kidney stones, difficulty emptying the  bladder, difficulty controlling the flow of urine, or chronic kidney disease Hematological History: No reported hematological signs or symptoms such as prolonged bleeding, low or poor functioning platelets, bruising or bleeding easily, hereditary bleeding problems, low energy levels due to low hemoglobin or being anemic Endocrine History: High blood sugar requiring insulin (IDDM) Rheumatologic History: No reported rheumatological signs and symptoms such as fatigue, joint pain, tenderness, swelling, redness, heat, stiffness, decreased range of motion, with or without associated rash Musculoskeletal History: Negative for myasthenia gravis, muscular dystrophy, multiple sclerosis or malignant hyperthermia Work History: Working full time  Allergies  Mr. Vosler has No Known Allergies.  Laboratory Chemistry  Inflammation Markers Lab Results  Component Value Date   ESRSEDRATE 31 (H) 03/30/2017   (CRP: Acute Phase) (ESR: Chronic Phase) Renal Function Markers Lab Results  Component Value Date   BUN 21 (H) 03/30/2017   CREATININE 1.11 03/30/2017   GFRAA >60 03/30/2017   GFRNONAA >60 03/30/2017   Hepatic Function Markers Lab Results  Component Value Date   AST 26 03/30/2017   ALT 10 (L) 03/30/2017   ALBUMIN 3.8 03/30/2017   ALKPHOS 53 03/30/2017   Electrolytes Lab Results  Component Value Date   NA 143 03/30/2017   K 4.1 03/30/2017   CL 108 03/30/2017   CALCIUM 9.7 03/30/2017   MG 1.3 (L) 03/30/2017   Neuropathy Markers No results found for: [id number] Bone Pathology Markers Lab Results  Component Value Date   ALKPHOS 53 03/30/2017   VD25OH 16 (L) 08/18/2009   CALCIUM 9.7 03/30/2017   TESTOSTERONE 154.76 (L) 08/18/2009   Coagulation Parameters Lab Results  Component Value Date   PLT 365 12/05/2015   Cardiovascular Markers Lab Results  Component Value Date   HGB 14.5 12/05/2015   HCT 43.2 12/05/2015   Note: Lab results reviewed.  [place]  Drug: Mr. Khamis  reports  that he does not use drugs. Alcohol:  reports that he drinks alcohol. Tobacco:  reports that he has never smoked. He has never used smokeless tobacco. Medical:  has a past medical history of Allergy; Foot pain, bilateral; GERD (gastroesophageal reflux disease); Hyperlipidemia; Hypertension; Polyarthralgia; Type 2 diabetes mellitus with diabetic neuropathy ([place]); and Wears glasses. Family: family history includes Arthritis in his sister; Diabetes in his mother, sister, sister, and sister; Heart disease in his sister; Hypertension in his mother; Other in his father.  Past Surgical History:  Procedure Laterality Date  . COLONOSCOPY     referral pending 03/2015  . NO PAST SURGERIES     as of 03/2015   Active Ambulatory Problems    Diagnosis Date Noted  . Essential hypertension 03/14/2015  . Type 2 diabetes mellitus with diabetic mononeuropathy ([place]) 03/14/2015  . Encounter for health maintenance examination in adult 03/14/2015  . Screening for prostate cancer 03/14/2015  . Special screening for malignant neoplasms, colon 03/14/2015  . Hyperlipidemia 03/14/2015  . Nocturia 03/14/2015  . Urinary frequency 03/14/2015  . Foot pain, bilateral 03/14/2015  . Need for prophylactic vaccination and inoculation against influenza 03/14/2015  . Need for prophylactic vaccination against Streptococcus pneumoniae (pneumococcus) 03/14/2015  . Need for Tdap vaccination 03/14/2015  . Chronic nausea 03/14/2015  .  Gastroesophageal reflux disease without esophagitis 03/14/2015  . Elevated PSA 12/05/2015  . Noncompliance 12/05/2015  . Chronic left shoulder pain (Primary Area of Pain) 03/30/2017  . Chronic pain of left knee [facility] Area of Pain) 03/30/2017  . Disorder of bone, unspecified 03/30/2017  . Other reduced mobility 03/30/2017  . Other specified health status 03/30/2017  . Long term current use of opiate analgesic 03/30/2017  . Opiate use 03/30/2017  . Chronic pain syndrome 03/30/2017  . Chronic  upper extremity pain (Secondary Area of Pain)(left) 03/30/2017   Resolved Ambulatory Problems    Diagnosis Date Noted  . No Resolved Ambulatory Problems   Past Medical History:  Diagnosis Date  . Allergy   . Foot pain, bilateral   . GERD (gastroesophageal reflux disease)   . Hyperlipidemia   . Hypertension   . Polyarthralgia   . Type 2 diabetes mellitus with diabetic neuropathy ([place])   . Wears glasses    Constitutional Exam  General appearance: Well nourished, well developed, and well hydrated. In no apparent acute distress Vitals:   03/30/17 0809  BP: 130/76  Pulse: 92  Resp: 16  Temp: 97.7 F (36.5 C)  TempSrc: Oral  SpO2: 99%  Weight: 287 lb (130.2 kg)  Height: 5' 11"  (1.803 m)   BMI Assessment: Estimated body mass index is 40.03 kg/m as calculated from the following:   Height as of this encounter: 5' 11"  (1.803 m).   Weight as of this encounter: 287 lb (130.2 kg).  BMI interpretation table: BMI level Category Range association with higher incidence of chronic pain  <18 kg/m2 Underweight   18.5-24.9 kg/m2 Ideal body weight   25-29.9 kg/m2 Overweight Increased incidence by 20%  30-34.9 kg/m2 Obese (Class I) Increased incidence by 68%  35-39.9 kg/m2 Severe obesity (Class II) Increased incidence by 136%  >40 kg/m2 Extreme obesity (Class III) Increased incidence by 254%   BMI Readings from Last 4 Encounters:  03/30/17 40.03 kg/m  01/10/17 42.68 kg/m  09/06/16 42.37 kg/m  12/05/15 38.77 kg/m   Wt Readings from Last 4 Encounters:  03/30/17 287 lb (130.2 kg)  01/10/17 (!) 306 lb (138.8 kg)  09/06/16 (!) 303 lb 12.8 oz (137.8 kg)  12/05/15 278 lb (126.1 kg)  Psych/Mental status: Alert, oriented x 3 (person, place, & time)       Eyes: PERLA Respiratory: No evidence of acute respiratory distress  Cervical Spine Exam  Inspection: No masses, redness, or swelling Alignment: Symmetrical Functional ROM: Unrestricted ROM      Stability: No instability  detected Muscle strength & Tone: Functionally intact Sensory: Unimpaired Palpation: No palpable anomalies              Upper Extremity (UE) Exam    Side: Right upper extremity  Side: Left upper extremity  Inspection: No masses, redness, swelling, or asymmetry. No contractures  Inspection: No masses, redness, swelling, or asymmetry. No contractures  Functional ROM: Full ROM          Functional ROM: Pain restricted ROM          Muscle strength & Tone: Normal strength (5/5)  Muscle strength & Tone: Movement possible against some resistance (4/5)  Sensory: Unimpaired  Sensory: Unimpaired  Palpation: Non-tender              Palpation: Complains of area being tender to palpation              Specialized Test(s): Deferred         Specialized Test(s): Deferred  Thoracic Spine Exam  Inspection: No masses, redness, or swelling Alignment: Symmetrical Functional ROM: Unrestricted ROM Stability: No instability detected Sensory: Unimpaired Muscle strength & Tone: No palpable anomalies  Lumbar Spine Exam  Inspection: No masses, redness, or swelling Alignment: Symmetrical Functional ROM: Unrestricted ROM      Stability: No instability detected Muscle strength & Tone: Functionally intact Sensory: Unimpaired Palpation: No palpable anomalies       Provocative Tests: Lumbar Hyperextension and rotation test: evaluation deferred today       Patrick's Maneuver: evaluation deferred today                    Gait & Posture Assessment  Ambulation: Unassisted Gait: Relatively normal for age and body habitus Posture: WNL   Lower Extremity Exam    Side: Right lower extremity  Side: Left lower extremity  Inspection: No masses, redness, swelling, or asymmetry. No contractures  Inspection: No masses, redness, swelling, or asymmetry. No contractures  Functional ROM: Adequate ROM for knee joint  Functional ROM: Adequate ROM for knee joint  Muscle strength & Tone: Functionally intact  Muscle strength  & Tone: Functionally intact  Sensory: Unimpaired  Sensory: Unimpaired  Palpation: Non-tender  Palpation: Non-tender   Assessment  Primary Diagnosis & Pertinent Problem List: The primary encounter diagnosis was Chronic left shoulder pain. Diagnoses of Chronic pain of left knee, Chronic pain of left upper extremity, Chronic pain syndrome, Disorder of bone, unspecified, Other specified health status, Other reduced mobility, Long term current use of opiate analgesic, and Opiate use were also pertinent to this visit.  Visit Diagnosis: 1. Chronic left shoulder pain   2. Chronic pain of left knee   3. Chronic pain of left upper extremity   4. Chronic pain syndrome   5. Disorder of bone, unspecified   6. Other specified health status   7. Other reduced mobility   8. Long term current use of opiate analgesic   9. Opiate use    Plan of Care  Initial treatment plan:  Please be advised that as per protocol, today's visit has been an evaluation only. We have not taken over the patient's controlled substance management.  Problem-specific plan: No problem-specific Assessment & Plan notes found for this encounter.  Ordered Lab-work, Procedure(s), Referral(s), & Consult(s): Orders Placed This Encounter  Procedures  . DG Knee 1-2 Views Left  . Compliance Drug Analysis, Ur  . Comp. Metabolic Panel (12)  . C-reactive protein  . Sedimentation rate  . Magnesium  . 25-Hydroxyvitamin D Lcms D2+D3  . Vitamin B12  . Ambulatory referral to Psychology   Pharmacotherapy: Medications ordered:  No orders of the defined types were placed in this encounter.  Medications administered during this visit: Mr. Joubert had no medications administered during this visit.   Pharmacotherapy under consideration:  Opioid Analgesics: The patient was informed that there is no guarantee that he would be a candidate for opioid analgesics. The decision will be made following CDC guidelines. This decision will be based  on the results of diagnostic studies, as well as Mr. Wearing risk profile.  Membrane stabilizer: To be determined at a later time Muscle relaxant: To be determined at a later time NSAID: To be determined at a later time Other analgesic(s): To be determined at a later time   Interventional therapies under consideration: Mr. Sippel was informed that there is no guarantee that he would be a candidate for interventional therapies. The decision will be based on the results of diagnostic  studies, as well as Mr. Campos risk profile.  Possible procedure(s): Diagnostic Left intra-articular shoulder injection Diagnostic Left suprascapular nerve block Possible left suprascapular radiofrequency ablation Diagnostic left intra-articular knee injection Diagnostic left genicular nerve block Possible left knee radiofrequency ablation   Provider-requested follow-up: Return for 2nd Visit, w/ Dr. Dossie Arbour, after [facility] eval.  Future Appointments Date Time Provider [place]  04/07/2017 10:00 AM Clent Demark, PA-C [facility] None    Primary Care Physician: Tawny Asal Location: [facility] Outpatient Pain Management Facility Note by:  Date: 03/30/2017; Time: 1:29 PM  Pain Score Disclaimer: We use the NRS-11 scale. This is a self-reported, subjective measurement of pain severity with only modest accuracy. It is used primarily to identify changes within a particular patient. It must be understood that outpatient pain scales are significantly less accurate that those used for research, where they can be applied under ideal controlled circumstances with minimal exposure to variables. In reality, the score is likely to be a combination of pain intensity and pain affect, where pain affect describes the degree of emotional arousal or changes in action readiness caused by the sensory experience of pain. Factors such as social and work situation, setting, emotional state, anxiety levels,  expectation, and prior pain experience may influence pain perception and show large inter-individual differences that may also be affected by time variables.  Patient instructions provided during this appointment: Patient Instructions    ____________________________________________________________________________________________  Appointment Policy Summary  It is our goal and responsibility to provide the medical community with assistance in the evaluation and management of patients with chronic pain. Unfortunately our resources are limited. Because we do not have an unlimited amount of time, or available appointments, we are required to closely monitor and manage their use. The following rules exist to maximize their use:  Patient's responsibilities: 1. Punctuality:  At what time should I arrive? You should be physically present in our office 30 minutes before your scheduled appointment. Your scheduled appointment is with your assigned healthcare provider. However, it takes 5-10 minutes to be "checked-in", and another 15 minutes for the nurses to do the admission. If you arrive to our office at the time you were given for your appointment, you will end up being at least 20-25 minutes late to your appointment with the provider. 2. Tardiness:  What happens if I arrive only a few minutes after my scheduled appointment time? You will need to reschedule your appointment. The cutoff is your appointment time. This is why it is so important that you arrive at least 30 minutes before that appointment. If you have an appointment scheduled for 10:00 AM and you arrive at 10:01, you will be required to reschedule your appointment.  3. Plan ahead:  Always assume that you will encounter traffic on your way in. Plan for it. If you are dependent on a driver, make sure they understand these rules and the need to arrive early. 4. Other appointments and responsibilities:  Avoid scheduling any other appointments  before or after your pain clinic appointments.  5. Be prepared:  Write down everything that you need to discuss with your healthcare provider and give this information to the admitting nurse. Write down the medications that you will need refilled. Bring your pills and bottles (even the empty ones), to all of your appointments, except for those where a procedure is scheduled. 6. No children or pets:  Find someone to take care of them. It is not appropriate to bring them in. 7. Scheduling  changes:  We request "advanced notification" of any changes or cancellations. 8. Advanced notification:  Defined as a time period of more than 24 hours prior to the originally scheduled appointment. This allows for the appointment to be offered to other patients. 9. Rescheduling:  When a visit is rescheduled, it will require the cancellation of the original appointment. For this reason they both fall within the category of "Cancellations".  10. Cancellations:  They require advanced notification. Any cancellation less than 24 hours before the  appointment will be recorded as a "No Show". 11. No Show:  Defined as an unkept appointment where the patient failed to notify or declare to the practice their intention or inability to keep the appointment.  Corrective process for repeat offenders:  1. Tardiness: Three (3) episodes of rescheduling due to late arrivals will be recorded as one (1) "No Show". 2. Cancellation or reschedule: Three (3) cancellations or rescheduling will be recorded as one (1) "No Show". 3. "No Shows": Three (3) "No Shows" within a 12 month period will result in discharge from the practice.  ____________________________________________________________________________________________  ____________________________________________________________________________________________  Pain Scale  Introduction: The pain score used by this practice is the Verbal Numerical Rating Scale (VNRS-11). This  is an 11-point scale. It is for adults and children 10 years or older. There are significant differences in how the pain score is reported, used, and applied. Forget everything you learned in the past and learn this scoring system.  General Information: The scale should reflect your current level of pain. Unless you are specifically asked for the level of your worst pain, or your average pain. If you are asked for one of these two, then it should be understood that it is over the past 24 hours.  Basic Activities of Daily Living (ADL): Personal hygiene, dressing, eating, transferring, and using restroom.  Instructions: Most patients tend to report their level of pain as a combination of two factors, their physical pain and their psychosocial pain. This last one is also known as "suffering" and it is reflection of how physical pain affects you socially and psychologically. From now on, report them separately. From this point on, when asked to report your pain level, report only your physical pain. Use the following table for reference.  Pain Clinic Pain Levels (0-5/10)  Pain Level Score  Description  No Pain 0   Mild pain 1 Nagging, annoying, but does not interfere with basic activities of daily living (ADL). Patients are able to eat, bathe, get dressed, toileting (being able to get on and off the toilet and perform personal hygiene functions), transfer (move in and out of bed or a chair without assistance), and maintain continence (able to control bladder and bowel functions). Blood pressure and heart rate are unaffected. A normal heart rate for a healthy adult ranges from 60 to 100 bpm (beats per minute).   Mild to moderate pain 2 Noticeable and distracting. Impossible to hide from other people. More frequent flare-ups. Still possible to adapt and function close to normal. It can be very annoying and may have occasional stronger flare-ups. With discipline, patients may get used to it and adapt.    Moderate pain 3 Interferes significantly with activities of daily living (ADL). It becomes difficult to feed, bathe, get dressed, get on and off the toilet or to perform personal hygiene functions. Difficult to get in and out of bed or a chair without assistance. Very distracting. With effort, it can be ignored when deeply involved in activities.  Moderately severe pain 4 Impossible to ignore for more than a few minutes. With effort, patients may still be able to manage work or participate in some social activities. Very difficult to concentrate. Signs of autonomic nervous system discharge are evident: dilated pupils (mydriasis); mild sweating (diaphoresis); sleep interference. Heart rate becomes elevated (>115 bpm). Diastolic blood pressure (lower number) rises above 100 mmHg. Patients find relief in laying down and not moving.   Severe pain 5 Intense and extremely unpleasant. Associated with frowning face and frequent crying. Pain overwhelms the senses.  Ability to do any activity or maintain social relationships becomes significantly limited. Conversation becomes difficult. Pacing back and forth is common, as getting into a comfortable position is nearly impossible. Pain wakes you up from deep sleep. Physical signs will be obvious: pupillary dilation; increased sweating; goosebumps; brisk reflexes; cold, clammy hands and feet; nausea, vomiting or dry heaves; loss of appetite; significant sleep disturbance with inability to fall asleep or to remain asleep. When persistent, significant weight loss is observed due to the complete loss of appetite and sleep deprivation.  Blood pressure and heart rate becomes significantly elevated. Caution: If elevated blood pressure triggers a pounding headache associated with blurred vision, then the patient should immediately seek attention at an urgent or emergency care unit, as these may be signs of an impending stroke.    Emergency Department Pain Levels (6-10/10)   Emergency Room Pain 6 Severely limiting. Requires emergency care and should not be seen or managed at an outpatient pain management facility. Communication becomes difficult and requires great effort. Assistance to reach the emergency department may be required. Facial flushing and profuse sweating along with potentially dangerous increases in heart rate and blood pressure will be evident.   Distressing pain 7 Self-care is very difficult. Assistance is required to transport, or use restroom. Assistance to reach the emergency department will be required. Tasks requiring coordination, such as bathing and getting dressed become very difficult.   Disabling pain 8 Self-care is no longer possible. At this level, pain is disabling. The individual is unable to do even the most "basic" activities such as walking, eating, bathing, dressing, transferring to a bed, or toileting. Fine motor skills are lost. It is difficult to think clearly.   Incapacitating pain 9 Pain becomes incapacitating. Thought processing is no longer possible. Difficult to remember your own name. Control of movement and coordination are lost.   The worst pain imaginable 10 At this level, most patients pass out from pain. When this level is reached, collapse of the autonomic nervous system occurs, leading to a sudden drop in blood pressure and heart rate. This in turn results in a temporary and dramatic drop in blood flow to the brain, leading to a loss of consciousness. Fainting is one of the body's self defense mechanisms. Passing out puts the brain in a calmed state and causes it to shut down for a while, in order to begin the healing process.    Summary: 1. Refer to this scale when providing [place] with your pain level. 2. Be accurate and careful when reporting your pain level. This will help with your care. 3. Over-reporting your pain level will lead to loss of credibility. 4. Even a level of 1/10 means that there is pain and will be  treated at our facility. 5. High, inaccurate reporting will be documented as "Symptom Exaggeration", leading to loss of credibility and suspicions of possible secondary gains such as obtaining more narcotics, or wanting to appear disabled,  for fraudulent reasons. 6. Only pain levels of 5 or below will be seen at our facility. 7. Pain levels of 6 and above will be sent to the Emergency Department and the appointment cancelled. ____________________________________________________________________________________________   BMI Assessment: Estimated body mass index is 40.03 kg/m as calculated from the following:   Height as of this encounter: 5' 11"  (1.803 m).   Weight as of this encounter: 287 lb (130.2 kg).  BMI interpretation table: BMI level Category Range association with higher incidence of chronic pain  <18 kg/m2 Underweight   18.5-24.9 kg/m2 Ideal body weight   25-29.9 kg/m2 Overweight Increased incidence by 20%  30-34.9 kg/m2 Obese (Class I) Increased incidence by 68%  35-39.9 kg/m2 Severe obesity (Class II) Increased incidence by 136%  >40 kg/m2 Extreme obesity (Class III) Increased incidence by 254%   BMI Readings from Last 4 Encounters:  03/30/17 40.03 kg/m  01/10/17 42.68 kg/m  09/06/16 42.37 kg/m  12/05/15 38.77 kg/m   Wt Readings from Last 4 Encounters:  03/30/17 287 lb (130.2 kg)  01/10/17 (!) 306 lb (138.8 kg)  09/06/16 (!) 303 lb 12.8 oz (137.8 kg)  12/05/15 278 lb (126.1 kg)

## 2017-03-30 NOTE — Progress Notes (Signed)
Safety precautions to be maintained throughout the outpatient stay will include: orient to surroundings, keep bed in low position, maintain call bell within reach at all times, provide assistance with transfer out of bed and ambulation.  

## 2017-03-31 ENCOUNTER — Telehealth: Payer: Self-pay | Admitting: *Deleted

## 2017-04-02 LAB — 25-HYDROXY VITAMIN D LCMS D2+D3
25-Hydroxy, Vitamin D-2: <1 ng/mL
25-Hydroxy, Vitamin D: 18 ng/mL — ABNORMAL LOW

## 2017-04-02 LAB — 25-HYDROXYVITAMIN D LCMS D2+D3: 25-HYDROXY, VITAMIN D-3: 18 ng/mL

## 2017-04-04 ENCOUNTER — Telehealth (INDEPENDENT_AMBULATORY_CARE_PROVIDER_SITE_OTHER): Payer: Self-pay | Admitting: Orthopedic Surgery

## 2017-04-04 ENCOUNTER — Other Ambulatory Visit: Payer: Self-pay | Admitting: Nurse Practitioner

## 2017-04-04 ENCOUNTER — Encounter: Payer: Self-pay | Admitting: Nurse Practitioner

## 2017-04-04 DIAGNOSIS — E559 Vitamin D deficiency, unspecified: Secondary | ICD-10-CM | POA: Insufficient documentation

## 2017-04-04 MED ORDER — ERGOCALCIFEROL 1.25 MG (50000 UT) PO CAPS: 50000.0000 [IU] | ORAL_CAPSULE | ORAL | 0 refills | Status: AC

## 2017-04-04 NOTE — Telephone Encounter (Signed)
I called patient,lmvm. Advising ,per our xray dept. he will have to get his MRI CD from [facility].

## 2017-04-05 ENCOUNTER — Telehealth: Payer: Self-pay | Admitting: *Deleted

## 2017-04-05 LAB — COMPLIANCE DRUG ANALYSIS, UR

## 2017-04-05 NOTE — Telephone Encounter (Signed)
Patient informed of low Vitamin D level and instructed to get prescription.

## 2017-04-05 NOTE — Telephone Encounter (Signed)
-----   Message from Vevelyn Francois, NP sent at 04/05/2017 10:28 AM EDT ----- Regarding: Vitamin D Please notify the patient and make him aware that his Vitamin D level is low at 18. He has Vitamin D deficiency. I have sent in Vit D 50,000 units; 1 tab weekly for 12 weeks. Once completing this start OTC vit D 2000 units daily. thanks

## 2017-04-07 ENCOUNTER — Ambulatory Visit (INDEPENDENT_AMBULATORY_CARE_PROVIDER_SITE_OTHER): Payer: PRIVATE HEALTH INSURANCE | Admitting: Physician Assistant

## 2017-04-15 ENCOUNTER — Encounter (INDEPENDENT_AMBULATORY_CARE_PROVIDER_SITE_OTHER): Payer: Self-pay | Admitting: Physician Assistant

## 2017-04-15 ENCOUNTER — Ambulatory Visit (INDEPENDENT_AMBULATORY_CARE_PROVIDER_SITE_OTHER): Payer: No Typology Code available for payment source | Admitting: Physician Assistant

## 2017-04-15 VITALS — BP 116/81 | HR 87 | Temp 98.0°F | Wt 292.4 lb

## 2017-04-15 DIAGNOSIS — E1141 Type 2 diabetes mellitus with diabetic mononeuropathy: Secondary | ICD-10-CM

## 2017-04-15 DIAGNOSIS — M25561 Pain in right knee: Secondary | ICD-10-CM | POA: Diagnosis not present

## 2017-04-15 DIAGNOSIS — J0130 Acute sphenoidal sinusitis, unspecified: Secondary | ICD-10-CM | POA: Diagnosis not present

## 2017-04-15 LAB — POCT GLYCOSYLATED HEMOGLOBIN (HGB A1C): HEMOGLOBIN A1C: 6.2

## 2017-04-15 MED ORDER — AMOXICILLIN-POT CLAVULANATE 875-125 MG PO TABS: 1.0000 | ORAL_TABLET | Freq: Two times a day (BID) | ORAL | 0 refills | Status: DC

## 2017-04-15 MED ORDER — METFORMIN HCL 1000 MG PO TABS: 1000.0000 mg | ORAL_TABLET | Freq: Two times a day (BID) | ORAL | 3 refills | Status: DC

## 2017-04-15 MED ORDER — PHENYLEPHRINE-DM-GG-APAP 5-10-200-325 MG/10ML PO LIQD: 20.0000 mL | ORAL | 0 refills | Status: DC

## 2017-04-15 MED ORDER — NAPROXEN 500 MG PO TABS: 500.0000 mg | ORAL_TABLET | Freq: Two times a day (BID) | ORAL | 0 refills | Status: DC

## 2017-04-15 MED ORDER — PHENYLEPHRINE-DM-GG-APAP 5-10-200-325 MG/10ML PO LIQD: 20.0000 mL | ORAL | 0 refills | Status: AC

## 2017-04-15 NOTE — Progress Notes (Signed)
Subjective:  Patient ID: Matthew Marus Sr., male    DOB: [birth year]  Age: 61 y.o. MRN: [id number]  CC: f/u DM  HPI  Matthew Wulff Sr. is a 61 y.o. male with a medical history of HTN, DM2, HLD, GERD, and diabetic neuropathy presents to f/u on DM. Last A1c 6.8% and 6.2% in clinic today. Does not endorse polydipsia, polyphagia, polyuria, visual blurring, fatigue, or tingling/numbness.    Complains of 10 day history of nasal congestion, cough, fever, pressure behind the left eye.    Complains of right knee pain since three days ago. 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 Did not visually see the patella out of place. Pain is currently on the medial aspect of knee over the tibial plateau. Exacerbated with walking. Goes to pain clinic for other issues. Asks if a pain killer could be prescribed to him today.    Outpatient Medications Prior to Visit  Medication Sig Dispense Refill  . aspirin EC 81 MG tablet Take 1 tablet (81 mg total) by mouth daily. 90 tablet 3  . budesonide (RHINOCORT AQUA) 32 MCG/ACT nasal spray Place 2 sprays into both nostrils daily. 8.43 mL 0  . cetirizine (ZYRTEC) 10 MG tablet Take 1 tablet (10 mg total) by mouth daily. 30 tablet 0  . docusate sodium (COLACE) 100 MG capsule Take 200 mg by mouth daily as needed for mild constipation.    . gabapentin (NEURONTIN) 400 MG capsule Take 1 capsule (400 mg total) by mouth 3 (three) times daily. 270 capsule 1  . hydrochlorothiazide (HYDRODIURIL) 12.5 MG tablet Take 1 tablet (12.5 mg total) by mouth daily. 90 tablet 3  . lisinopril (PRINIVIL,ZESTRIL) 40 MG tablet Take 1 tablet (40 mg total) by mouth daily. 90 tablet 3  . metFORMIN (GLUCOPHAGE) 1000 MG tablet Take 1 tablet (1,000 mg total) by mouth 2 (two) times daily with a meal. 180 tablet 3  . tadalafil (CIALIS) 20 MG tablet Take 1 tablet (20 mg total) by mouth daily as needed for erectile dysfunction. 10 tablet 2  . ergocalciferol (VITAMIN D2)  50000 units capsule Take 1 capsule (50,000 Units total) by mouth once a week. X 12 weeks. (Patient not taking: Reported on 04/15/2017) 12 capsule 0   No facility-administered medications prior to visit.      ROS Review of Systems  Constitutional: Negative for chills, fever and malaise/fatigue.  HENT: Positive for congestion and sinus pain. Negative for sore throat.   Eyes: Negative for blurred vision.  Respiratory: Positive for cough (seldom). Negative for shortness of breath.   Cardiovascular: Negative for chest pain and palpitations.  Gastrointestinal: Negative for abdominal pain and nausea.  Genitourinary: Negative for dysuria and hematuria.  Musculoskeletal: Positive for joint pain. Negative for myalgias.  Skin: Negative for rash.  Neurological: Negative for tingling and headaches.  Psychiatric/Behavioral: Negative for depression. The patient is not nervous/anxious.     Objective:  BP 116/81 (BP Location: Left Arm, Patient Position: Sitting, Cuff Size: Large)   Pulse 87   Temp 98 F (36.7 C) (Oral)   Wt 292 lb 6.4 oz (132.6 kg)   SpO2 97%   BMI 40.78 kg/m   BP/Weight 04/15/2017 7/34/1937 9/0/2409  Systolic BP 735 329 924  Diastolic BP 81 76 91  Wt. (Lbs) 292.4 287 306  BMI 40.78 40.03 42.68      Physical Exam  Constitutional: He is oriented to person, place, and time.  Well developed, obese, NAD,  polite  HENT:  Head: Normocephalic and atraumatic.  Mouth/Throat: No oropharyngeal exudate.  No frontal or maxillary sinus TTP.   Eyes: No scleral icterus.  Cardiovascular: Normal rate, regular rhythm and normal heart sounds.   Pulmonary/Chest: Effort normal and breath sounds normal.  Musculoskeletal: He exhibits no edema.  Full aROM of right knee. TTP over the medial aspect of the right tibial plateau. No edema, erythema, ecchymosis, effusion, or patellar apprehension.  Lymphadenopathy:    He has no cervical adenopathy.  Neurological: He is alert and oriented to  person, place, and time.  Skin: Skin is warm and dry. No rash noted. No erythema. No pallor.  Psychiatric: He has a normal mood and affect. His behavior is normal. Thought content normal.  Vitals reviewed.    Assessment & Plan:    1. Type 2 diabetes mellitus with diabetic mononeuropathy, without long-term current use of insulin (HCC) - HgB A1c 6.2% in clinic today - Refill metFORMIN (GLUCOPHAGE) 1000 MG tablet; Take 1 tablet (1,000 mg total) by mouth 2 (two) times daily with a meal.  Dispense: 180 tablet; Refill: 3  2. Acute pain of right knee - AMB referral to orthopedics - Begin naproxen (NAPROSYN) 500 MG tablet; Take 1 tablet (500 mg total) by mouth 2 (two) times daily with a meal.  Dispense: 30 tablet; Refill: 0  3. Acute non-recurrent sphenoidal sinusitis - Begin amoxicillin-clavulanate (AUGMENTIN) 875-125 MG tablet; Take 1 tablet by mouth 2 (two) times daily.  Dispense: 20 tablet; Refill: 0 - Begin Phenylephrine-DM-GG-APAP (MUCINEX FAST-MAX COLD FLU) 5-10-200-325 MG/10ML LIQD; Take 20 mLs by mouth every 4 (four) hours.  Dispense: 1 Bottle; Refill: 0  Meds ordered this encounter  Medications  . DISCONTD: amoxicillin-clavulanate (AUGMENTIN) 875-125 MG tablet    Sig: Take 1 tablet by mouth 2 (two) times daily.    Dispense:  20 tablet    Refill:  0    Order Specific Question:   Supervising Provider    Answer:   Tresa Garter [id number]  . DISCONTD: Phenylephrine-DM-GG-APAP (MUCINEX FAST-MAX COLD FLU) 5-10-200-325 MG/10ML LIQD    Sig: Take 20 mLs by mouth every 4 (four) hours.    Dispense:  1 Bottle    Refill:  0    Order Specific Question:   Supervising Provider    Answer:   Tresa Garter [id number]  . DISCONTD: naproxen (NAPROSYN) 500 MG tablet    Sig: Take 1 tablet (500 mg total) by mouth 2 (two) times daily with a meal.    Dispense:  30 tablet    Refill:  0    Order Specific Question:   Supervising Provider    Answer:   Tresa Garter [id number]  .  DISCONTD: metFORMIN (GLUCOPHAGE) 1000 MG tablet    Sig: Take 1 tablet (1,000 mg total) by mouth 2 (two) times daily with a meal.    Dispense:  180 tablet    Refill:  3    Cancel previous prescription for Metformin 500 mg please.    Order Specific Question:   Supervising Provider    Answer:   Tresa Garter [id number]  . amoxicillin-clavulanate (AUGMENTIN) 875-125 MG tablet    Sig: Take 1 tablet by mouth 2 (two) times daily.    Dispense:  20 tablet    Refill:  0    Order Specific Question:   Supervising Provider    Answer:   Tresa Garter [id number]  . metFORMIN (GLUCOPHAGE) 1000 MG tablet    Sig: Take 1 tablet (  1,000 mg total) by mouth 2 (two) times daily with a meal.    Dispense:  180 tablet    Refill:  3    Cancel previous prescription for Metformin 500 mg please.    Order Specific Question:   Supervising Provider    Answer:   Tresa Garter [id number]  . naproxen (NAPROSYN) 500 MG tablet    Sig: Take 1 tablet (500 mg total) by mouth 2 (two) times daily with a meal.    Dispense:  30 tablet    Refill:  0    Order Specific Question:   Supervising Provider    Answer:   Tresa Garter [id number]  . Phenylephrine-DM-GG-APAP (MUCINEX FAST-MAX COLD FLU) 5-10-200-325 MG/10ML LIQD    Sig: Take 20 mLs by mouth every 4 (four) hours.    Dispense:  1 Bottle    Refill:  0    Order Specific Question:   Supervising Provider    Answer:   Tresa Garter [id number]    Follow-up: Return in about 6 months (around 10/14/2017) for DM.   Clent Demark PA

## 2017-04-15 NOTE — Patient Instructions (Signed)
Upper Respiratory Infection, Adult Most upper respiratory infections (URIs) are a viral infection of the air passages leading to the lungs. A URI affects the nose, throat, and upper air passages. The most common type of URI is nasopharyngitis and is typically referred to as "the common cold." URIs run their course and usually go away on their own. Most of the time, a URI does not require medical attention, but sometimes a bacterial infection in the upper airways can follow a viral infection. This is called a secondary infection. Sinus and middle ear infections are common types of secondary upper respiratory infections. Bacterial pneumonia can also complicate a URI. A URI can worsen asthma and chronic obstructive pulmonary disease (COPD). Sometimes, these complications can require emergency medical care and may be life threatening. What are the causes? Almost all URIs are caused by viruses. A virus is a type of germ and can spread from one person to another. What increases the risk? You may be at risk for a URI if:  You smoke.  You have chronic heart or lung disease.  You have a weakened defense (immune) system.  You are very young or very old.  You have nasal allergies or asthma.  You work in crowded or poorly ventilated areas.  You work in health care facilities or schools.  What are the signs or symptoms? Symptoms typically develop 2-3 days after you come in contact with a cold virus. Most viral URIs last 7-10 days. However, viral URIs from the influenza virus (flu virus) can last 14-18 days and are typically more severe. Symptoms may include:  Runny or stuffy (congested) nose.  Sneezing.  Cough.  Sore throat.  Headache.  Fatigue.  Fever.  Loss of appetite.  Pain in your forehead, behind your eyes, and over your cheekbones (sinus pain).  Muscle aches.  How is this diagnosed? Your health care provider may diagnose a URI by:  Physical exam.  Tests to check that your  symptoms are not due to another condition such as: ? Strep throat. ? Sinusitis. ? Pneumonia. ? Asthma.  How is this treated? A URI goes away on its own with time. It cannot be cured with medicines, but medicines may be prescribed or recommended to relieve symptoms. Medicines may help:  Reduce your fever.  Reduce your cough.  Relieve nasal congestion.  Follow these instructions at home:  Take medicines only as directed by your health care provider.  Gargle warm saltwater or take cough drops to comfort your throat as directed by your health care provider.  Use a warm mist humidifier or inhale steam from a shower to increase air moisture. This may make it easier to breathe.  Drink enough fluid to keep your urine clear or pale yellow.  Eat soups and other clear broths and maintain good nutrition.  Rest as needed.  Return to work when your temperature has returned to normal or as your health care provider advises. You may need to stay home longer to avoid infecting others. You can also use a face mask and careful hand washing to prevent spread of the virus.  Increase the usage of your inhaler if you have asthma.  Do not use any tobacco products, including cigarettes, chewing tobacco, or electronic cigarettes. If you need help quitting, ask your health care provider. How is this prevented? The best way to protect yourself from getting a cold is to practice good hygiene.  Avoid oral or hand contact with people with cold symptoms.  Wash your   hands often if contact occurs.  There is no clear evidence that vitamin C, vitamin E, echinacea, or exercise reduces the chance of developing a cold. However, it is always recommended to get plenty of rest, exercise, and practice good nutrition. Contact a health care provider if:  You are getting worse rather than better.  Your symptoms are not controlled by medicine.  You have chills.  You have worsening shortness of breath.  You have  brown or red mucus.  You have yellow or brown nasal discharge.  You have pain in your face, especially when you bend forward.  You have a fever.  You have swollen neck glands.  You have pain while swallowing.  You have white areas in the back of your throat. Get help right away if:  You have severe or persistent: ? Headache. ? Ear pain. ? Sinus pain. ? Chest pain.  You have chronic lung disease and any of the following: ? Wheezing. ? Prolonged cough. ? Coughing up blood. ? A change in your usual mucus.  You have a stiff neck.  You have changes in your: ? Vision. ? Hearing. ? Thinking. ? Mood. This information is not intended to replace advice given to you by your health care provider. Make sure you discuss any questions you have with your health care provider. Document Released: 12/15/2000 Document Revised: 02/22/2016 Document Reviewed: 09/26/2013 Elsevier Interactive Patient Education  2017 Elsevier Inc.  

## 2017-04-21 ENCOUNTER — Ambulatory Visit (INDEPENDENT_AMBULATORY_CARE_PROVIDER_SITE_OTHER): Payer: No Typology Code available for payment source | Admitting: Orthopaedic Surgery

## 2017-04-26 ENCOUNTER — Telehealth (INDEPENDENT_AMBULATORY_CARE_PROVIDER_SITE_OTHER): Payer: Self-pay | Admitting: Orthopaedic Surgery

## 2017-04-26 ENCOUNTER — Ambulatory Visit (INDEPENDENT_AMBULATORY_CARE_PROVIDER_SITE_OTHER): Payer: No Typology Code available for payment source | Admitting: Orthopaedic Surgery

## 2017-04-26 NOTE — Telephone Encounter (Signed)
See messages below.  

## 2017-04-26 NOTE — Telephone Encounter (Signed)
I cannot refill any medicines until he sees me

## 2017-04-26 NOTE — Telephone Encounter (Signed)
Called pt to advise

## 2017-04-26 NOTE — Telephone Encounter (Signed)
Cx today's appt and R/S for 05/02/17

## 2017-04-26 NOTE — Telephone Encounter (Signed)
Patient called asking for a refill on his Tramadol to be sent into the [facility] on [facility]. CB # [phone]

## 2017-05-02 ENCOUNTER — Ambulatory Visit (INDEPENDENT_AMBULATORY_CARE_PROVIDER_SITE_OTHER): Payer: No Typology Code available for payment source | Admitting: Orthopaedic Surgery

## 2017-05-03 ENCOUNTER — Ambulatory Visit (INDEPENDENT_AMBULATORY_CARE_PROVIDER_SITE_OTHER): Payer: No Typology Code available for payment source | Admitting: Orthopaedic Surgery

## 2017-05-16 ENCOUNTER — Ambulatory Visit (INDEPENDENT_AMBULATORY_CARE_PROVIDER_SITE_OTHER): Payer: No Typology Code available for payment source

## 2017-05-16 ENCOUNTER — Ambulatory Visit (INDEPENDENT_AMBULATORY_CARE_PROVIDER_SITE_OTHER): Payer: No Typology Code available for payment source | Admitting: Orthopaedic Surgery

## 2017-05-16 ENCOUNTER — Ambulatory Visit (INDEPENDENT_AMBULATORY_CARE_PROVIDER_SITE_OTHER): Payer: Self-pay

## 2017-05-16 ENCOUNTER — Encounter (INDEPENDENT_AMBULATORY_CARE_PROVIDER_SITE_OTHER): Payer: Self-pay | Admitting: Orthopaedic Surgery

## 2017-05-16 DIAGNOSIS — M170 Bilateral primary osteoarthritis of knee: Secondary | ICD-10-CM

## 2017-05-16 MED ORDER — LIDOCAINE HCL 1 % IJ SOLN
2.0000 mL | INTRAMUSCULAR | Status: AC | PRN
  Administered 2017-05-16: 2 mL

## 2017-05-16 MED ORDER — TRAMADOL HCL 50 MG PO TABS: 50.0000 mg | ORAL_TABLET | Freq: Three times a day (TID) | ORAL | 2 refills | Status: DC | PRN

## 2017-05-16 MED ORDER — HYDROCODONE-ACETAMINOPHEN 5-325 MG PO TABS: 1.0000 | ORAL_TABLET | Freq: Every day | ORAL | 0 refills | Status: DC | PRN

## 2017-05-16 MED ORDER — METHYLPREDNISOLONE ACETATE 40 MG/ML IJ SUSP
40.0000 mg | INTRAMUSCULAR | Status: AC | PRN
  Administered 2017-05-16: 40 mg via INTRA_ARTICULAR

## 2017-05-16 MED ORDER — BUPIVACAINE HCL 0.5 % IJ SOLN
2.0000 mL | INTRAMUSCULAR | Status: AC | PRN
  Administered 2017-05-16: 2 mL via INTRA_ARTICULAR

## 2017-05-16 NOTE — Progress Notes (Signed)
Office Visit Note   Patient: Matthew Gripp Sr.           Date of Birth: 04/23/1956           MRN: [id number] Visit Date: 05/16/2017              Requested by: Clent Demark, PA-C [place], [place] [address] PCP: Clent Demark, PA-C   Assessment & Plan: Visit Diagnoses:  1. Bilateral primary osteoarthritis of knee     Plan: Impression is end-stage bilateral knee degenerative joint disease.  Prescription for tramadol and Norco today.  DEA drug database search shows no specific or suspicious prescribing behavior.  Left knee cortisone injection performed today.  Follow-up in 3 weeks for injection of his right knee.  Follow-Up Instructions: Return in about 3 weeks (around 06/06/2017).   Orders:  Orders Placed This Encounter  Procedures  . XR Knee 1-2 Views Right  . XR Knee 1-2 Views Left   Meds ordered this encounter  Medications  . HYDROcodone-acetaminophen (NORCO) 5-325 MG tablet    Sig: Take 1 tablet daily as needed by mouth.    Dispense:  30 tablet    Refill:  0  . traMADol (ULTRAM) 50 MG tablet    Sig: Take 1-2 tablets (50-100 mg total) 3 (three) times daily as needed by mouth.    Dispense:  30 tablet    Refill:  2      Procedures: Large Joint Inj: L knee on 05/16/2017 5:01 PM Details: 22 G needle Medications: 2 mL bupivacaine 0.5 %; 2 mL lidocaine 1 %; 40 mg methylPREDNISolone acetate 40 MG/ML Outcome: tolerated well, no immediate complications Patient was prepped and draped in the usual sterile fashion.       Clinical Data: No additional findings.   Subjective: Chief Complaint  Patient presents with  . Right Knee - Pain  . Left Knee - Pain    Matthew Colon is a 61 year old gentleman who comes in with bilateral knee pain worse on the left.  He endorses pain with cracking and popping without radiation or numbness and tingling.  He is diabetic.  He takes tramadol and occasional Norco.  He has had previous cortisone injections  with 3-4 months of relief.  Denies any recent injuries.    Review of Systems  Constitutional: Negative.   All other systems reviewed and are negative.    Objective: Vital Signs: There were no vitals taken for this visit.  Physical Exam  Constitutional: He is oriented to person, place, and time. He appears well-developed and well-nourished.  HENT:  Head: Normocephalic and atraumatic.  Eyes: Pupils are equal, round, and reactive to light.  Neck: Neck supple.  Pulmonary/Chest: Effort normal.  Abdominal: Soft.  Musculoskeletal: Normal range of motion.  Neurological: He is alert and oriented to person, place, and time.  Skin: Skin is warm.  Psychiatric: He has a normal mood and affect. His behavior is normal. Judgment and thought content normal.  Nursing note and vitals reviewed.   Ortho Exam Bilateral knee exam shows no joint effusion.  Cruciates are stable.  Well-preserved joint motion Specialty Comments:  No specialty comments available.  Imaging: Xr Knee 1-2 Views Left  Result Date: 05/16/2017 Advanced degenerative joint disease  Xr Knee 1-2 Views Right  Result Date: 05/16/2017 Advanced degenerative joint disease    PMFS History: Patient Active Problem List   Diagnosis Date Noted  . Vitamin D deficiency 04/04/2017  . Chronic left shoulder pain (Primary  Area of Pain) 03/30/2017  . Chronic pain of left knee [facility] Area of Pain) 03/30/2017  . Disorder of bone, unspecified 03/30/2017  . Other reduced mobility 03/30/2017  . Other specified health status 03/30/2017  . Long term current use of opiate analgesic 03/30/2017  . Opiate use 03/30/2017  . Chronic pain syndrome 03/30/2017  . Chronic upper extremity pain (Secondary Area of Pain)(left) 03/30/2017  . Elevated PSA 12/05/2015  . Noncompliance 12/05/2015  . Essential hypertension 03/14/2015  . Type 2 diabetes mellitus with diabetic mononeuropathy ([place]) 03/14/2015  . Encounter for health maintenance  examination in adult 03/14/2015  . Screening for prostate cancer 03/14/2015  . Special screening for malignant neoplasms, colon 03/14/2015  . Hyperlipidemia 03/14/2015  . Nocturia 03/14/2015  . Urinary frequency 03/14/2015  . Foot pain, bilateral 03/14/2015  . Need for prophylactic vaccination and inoculation against influenza 03/14/2015  . Need for prophylactic vaccination against Streptococcus pneumoniae (pneumococcus) 03/14/2015  . Need for Tdap vaccination 03/14/2015  . Chronic nausea 03/14/2015  . Gastroesophageal reflux disease without esophagitis 03/14/2015   Past Medical History:  Diagnosis Date  . Allergy   . Foot pain, bilateral    referred to podiatry 06/2014  . GERD (gastroesophageal reflux disease)    long history of  . Hyperlipidemia   . Hypertension   . Polyarthralgia    shoulders, knees, ankles, wrists; neg rheum lab screen 04/2014  . Type 2 diabetes mellitus with diabetic neuropathy ([place])   . Wears glasses     Family History  Problem Relation Age of Onset  . Diabetes Mother   . Hypertension Mother   . Diabetes Sister   . Arthritis Sister   . Heart disease Sister   . Other Father        murdered  . Diabetes Sister   . Diabetes Sister   . Cancer Neg Hx   . Stroke Neg Hx     Past Surgical History:  Procedure Laterality Date  . COLONOSCOPY     referral pending 03/2015  . NO PAST SURGERIES     as of 03/2015   Social History   Occupational History  . Not on file  Tobacco Use  . Smoking status: Never Smoker  . Smokeless tobacco: Never Used  Substance and Sexual Activity  . Alcohol use: Yes    Comment: occasional beer  . Drug use: No  . Sexual activity: Not on file

## 2017-06-03 ENCOUNTER — Ambulatory Visit (HOSPITAL_COMMUNITY): Payer: No Typology Code available for payment source | Admitting: Psychiatry

## 2017-06-07 ENCOUNTER — Ambulatory Visit (INDEPENDENT_AMBULATORY_CARE_PROVIDER_SITE_OTHER): Payer: No Typology Code available for payment source | Admitting: Orthopaedic Surgery

## 2017-08-24 ENCOUNTER — Encounter (HOSPITAL_COMMUNITY): Payer: Self-pay | Admitting: Psychiatry

## 2017-08-24 ENCOUNTER — Ambulatory Visit (INDEPENDENT_AMBULATORY_CARE_PROVIDER_SITE_OTHER): Payer: No Typology Code available for payment source | Admitting: Psychiatry

## 2017-08-24 VITALS — BP 142/80 | HR 92 | Ht 71.0 in | Wt 281.6 lb

## 2017-08-24 DIAGNOSIS — M25512 Pain in left shoulder: Secondary | ICD-10-CM | POA: Diagnosis not present

## 2017-08-24 DIAGNOSIS — M1990 Unspecified osteoarthritis, unspecified site: Secondary | ICD-10-CM

## 2017-08-24 DIAGNOSIS — G8929 Other chronic pain: Secondary | ICD-10-CM | POA: Diagnosis not present

## 2017-08-24 DIAGNOSIS — Z7689 Persons encountering health services in other specified circumstances: Secondary | ICD-10-CM

## 2017-08-24 NOTE — Progress Notes (Signed)
Psychiatric Initial Adult Assessment   Patient Identification: Matthew Kinney Sr. MRN:  [id number] Date of Evaluation:  08/24/2017 Referral Source: pain clinic Chief Complaint:  pain Visit Diagnosis:    ICD-10-CM   1. Encounter for psychiatric assessment Z76.89   2. Pain in joint of left shoulder M25.512     History of Present Illness:  Matthew Marus Sr. presents for a pain management psychiatric assessment.  Patient is unaware that he was going to see a psychiatrist today and thought that he was going to see a pain management provider.  I apologize for the confusion.  He was appropriate to participate.  He has no history of psychiatric treatment or inpatient hospitalizations.  He has no history of substance abuse, or addiction to prescription drugs.  He denies any suicidal thinking or unsafe thoughts towards others.  He reports that he has a good relationship with his children who live in [place], and lives with a friend here in [place].  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  He reports that he has chronic pain in his left shoulder from a combination of injuries and osteoarthritis, and reports that he had been seeing an orthopedic surgeon for pain management, and they refused to continue prescribing him opiates.  He denies any misuse, or diversion of those opiates.  He understands that opiates are habit forming, and have multiple neuropsychiatric side effects as well, and gastrointestinal side effects.  Regardless, he feels that they improve his quality of life.  He only takes them after work to reduce the effects of sedation so that he is still able to perform his job requirements.  He denies any significant depression symptoms or significant anxiety, and feels that he copes with things fairly well regardless of the pain.   Associated Signs/Symptoms: denies  Past Psychiatric History: none  Previous Psychotropic Medications: No   Substance Abuse  History in the last 12 months:  No.  Consequences of Substance Abuse: Negative  Past Medical History:  Past Medical History:  Diagnosis Date  . Allergy   . Foot pain, bilateral    referred to podiatry 06/2014  . GERD (gastroesophageal reflux disease)    long history of  . Hyperlipidemia   . Hypertension   . Polyarthralgia    shoulders, knees, ankles, wrists; neg rheum lab screen 04/2014  . Type 2 diabetes mellitus with diabetic neuropathy ([place])   . Wears glasses     Past Surgical History:  Procedure Laterality Date  . COLONOSCOPY     referral pending 03/2015  . NO PAST SURGERIES     as of 03/2015    Family Psychiatric History: none  Family History:  Family History  Problem Relation Age of Onset  . Diabetes Mother   . Hypertension Mother   . Diabetes Sister   . Arthritis Sister   . Heart disease Sister   . Other Father        murdered  . Diabetes Sister   . Diabetes Sister   . Cancer Neg Hx   . Stroke Neg Hx     Social History:   Social History   Socioeconomic History  . Marital status: Divorced    Spouse name: None  . Number of children: None  . Years of education: None  . Highest education level: None  Social Needs  . Financial resource strain: None  . Food insecurity - worry: None  . Food insecurity - inability: None  . Transportation needs -  medical: None  . Transportation needs - non-medical: None  Occupational History  . None  Tobacco Use  . Smoking status: Never Smoker  . Smokeless tobacco: Never Used  Substance and Sexual Activity  . Alcohol use: Yes    Comment: occasional beer  . Drug use: No  . Sexual activity: Yes  Other Topics Concern  . None  Social History Narrative   Lives with his sister.   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   Exercise - active on the job.  Has 3 children in [place].    Additional Social History: lives with roommate in [place], works fulltime at [employer]  Allergies:  No Known  Allergies  Metabolic Disorder Labs: Lab Results  Component Value Date   HGBA1C 6.2 04/15/2017   MPG 148 (H) 03/14/2015   No results found for: PROLACTIN Lab Results  Component Value Date   CHOL 188 03/14/2015   TRIG 83 03/14/2015   HDL 68 03/14/2015   CHOLHDL 2.8 03/14/2015   VLDL 17 03/14/2015   LDLCALC 103 03/14/2015   LDLCALC 115 (H) 04/29/2014     Current Medications: Current Outpatient Medications  Medication Sig Dispense Refill  . amoxicillin-clavulanate (AUGMENTIN) 875-125 MG tablet Take 1 tablet by mouth 2 (two) times daily. 20 tablet 0  . aspirin EC 81 MG tablet Take 1 tablet (81 mg total) by mouth daily. 90 tablet 3  . cetirizine (ZYRTEC) 10 MG tablet Take 1 tablet (10 mg total) by mouth daily. 30 tablet 0  . docusate sodium (COLACE) 100 MG capsule Take 200 mg by mouth daily as needed for mild constipation.    . gabapentin (NEURONTIN) 400 MG capsule Take 1 capsule (400 mg total) by mouth 3 (three) times daily. 270 capsule 1  . hydrochlorothiazide (HYDRODIURIL) 12.5 MG tablet Take 1 tablet (12.5 mg total) by mouth daily. 90 tablet 3  . lisinopril (PRINIVIL,ZESTRIL) 40 MG tablet Take 1 tablet (40 mg total) by mouth daily. 90 tablet 3  . metFORMIN (GLUCOPHAGE) 1000 MG tablet Take 1 tablet (1,000 mg total) by mouth 2 (two) times daily with a meal. 180 tablet 3  . tadalafil (CIALIS) 20 MG tablet Take 1 tablet (20 mg total) by mouth daily as needed for erectile dysfunction. 10 tablet 2  . traMADol (ULTRAM) 50 MG tablet Take 1-2 tablets (50-100 mg total) 3 (three) times daily as needed by mouth. 30 tablet 2  . HYDROcodone-acetaminophen (NORCO) 5-325 MG tablet Take 1 tablet daily as needed by mouth. (Patient not taking: Reported on 08/24/2017) 30 tablet 0   No current facility-administered medications for this visit.     Neurologic: Headache: Negative Seizure: Negative Paresthesias:Negative  Musculoskeletal: Strength & Muscle Tone: within normal limits Gait & Station:  normal Patient leans: N/A  Psychiatric Specialty Exam: Review of Systems  Constitutional: Negative.   HENT: Negative.   Respiratory: Negative.   Cardiovascular: Negative.   Gastrointestinal: Negative.   Musculoskeletal: Positive for joint pain.  Neurological: Negative.   Psychiatric/Behavioral: Negative.     Blood pressure (!) 142/80, pulse 92, height 5\' 11"  (1.803 m), weight 281 lb 9.6 oz (127.7 kg).Body mass index is 39.28 kg/m.  General Appearance: Casual and Fairly Groomed  Eye Contact:  Good  Speech:  Clear and Coherent and Normal Rate  Volume:  Normal  Mood:  Euthymic  Affect:  Appropriate and Congruent  Thought Process:  Goal Directed and Descriptions of Associations: Intact  Orientation:  Full (Time, Place, and Person)  Thought Content:  Logical  Suicidal Thoughts:  No  Homicidal Thoughts:  No  Memory:  Immediate;   Fair  Judgement:  Good  Insight:  Good  Psychomotor Activity:  Normal  Concentration:  Concentration: Good  Recall:  Good  Fund of Knowledge:Good  Language: Good  Akathisia:  Negative  Handed:  Right , 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 is a 62 year old male referred for psychiatric assessment in the context of seeking pain management.  He does not present with any psychiatric needs, nor does he present with any history concerning for substance abuse.  He does not present with any primary mood symptoms requiring ongoing psychiatric care.  He would be appropriate for primary pain management at discretion of the pain clinic.  He understands the risks and benefits associated with opiates.  No further follow-up in this clinic indicated.  Reviewed the [facility] controlled substance database, he is predominantly been prescribed tramadol, with periodic prescriptions for hydrocodone.  His prescriptions appear to him longer than prescribed, and I have a low suspicion of any misuse or diversion at this time.  1. Encounter for psychiatric assessment   2. Pain in joint of left shoulder     Status of current problems: new patient  Labs Ordered: No orders of the defined types were placed in this encounter.   Labs Reviewed: pain notes  Collateral Obtained/Records Reviewed: [facility]  Plan:  No psychiatric contraindications to pain management No further follow-up required  I spent 30 minutes with the patient in direct face-to-face clinical care.  Greater than 50% of this time was spent in counseling and coordination of care with the patient.    Aundra Dubin, MD 2/20/201910:46 AM

## 2017-08-30 NOTE — Progress Notes (Deleted)
Patient's Name: Matthew Marrow Sr.  MRN: [id number]  Referring Provider: Clent Demark, PA-C  DOB: 11/19/1955  PCP: Clent Demark, Hershal Coria  DOS: 08/31/2017  Note by: Gaspar Cola, MD  Service setting: Ambulatory outpatient  Specialty: Interventional Pain Management  Location: [facility] (AMB) Pain Management Facility    Patient type: Established   Primary Reason(s) for Visit: Encounter for evaluation before starting new chronic pain management plan of care (Level of risk: moderate) CC: No chief complaint on file.  HPI  Matthew Colon is a 62 y.o. year old, male patient, who comes today for a follow-up evaluation to review the test results and decide on a treatment plan. He has Essential hypertension; Type 2 diabetes mellitus with diabetic mononeuropathy ([place]); Encounter for health maintenance examination in adult; Screening for prostate cancer; Special screening for malignant neoplasms, colon; Hyperlipidemia; Nocturia; Urinary frequency; Foot pain, bilateral; Need for prophylactic vaccination and inoculation against influenza; Need for prophylactic vaccination against Streptococcus pneumoniae (pneumococcus); Need for Tdap vaccination; Chronic nausea; Gastroesophageal reflux disease without esophagitis; Elevated PSA; Noncompliance; Chronic left shoulder pain (Primary Area of Pain); Chronic pain of left knee [facility] Area of Pain); Disorder of bone, unspecified; Other reduced mobility; Other specified health status; Long term current use of opiate analgesic; Opiate use; Chronic pain syndrome; Chronic upper extremity pain (Secondary Area of Pain)(left); and Vitamin D deficiency on their problem list. His primarily concern today is the No chief complaint on file.  Pain Assessment: Location:     Radiating:   Onset:   Duration:   Quality:   Severity:  /10 (self-reported pain score)  Note: Reported level is compatible with observation.                         When using our objective Pain Scale,  levels between 6 and 10/10 are said to belong in an emergency room, as it progressively worsens from a 6/10, described as severely limiting, requiring emergency care not usually available at an outpatient pain management facility. At a 6/10 level, communication becomes difficult and requires great effort. Assistance to reach the emergency department may be required. Facial flushing and profuse sweating along with potentially dangerous increases in heart rate and blood pressure will be evident. Effect on ADL:   Timing:   Modifying factors:    Matthew Colon comes in today for a follow-up visit after his initial evaluation on 04/05/2017. Today we went over the results of his tests. These were explained in "Layman's terms". During today's appointment we went over my diagnostic impression, as well as the proposed treatment plan.  *** In considering the treatment plan options, Matthew Colon was reminded that I no longer take patients for medication management only. I asked him to let me know if he had no intention of taking advantage of the interventional therapies, so that we could make arrangements to provide this space to someone interested. I also made it clear that undergoing interventional therapies for the purpose of getting pain medications is very inappropriate on the part of a patient, and it will not be tolerated in this practice. This type of behavior would suggest true addiction and therefore it requires referral to an addiction specialist.   Further details on both, my assessment(s), as well as the proposed treatment plan, please see below.  Controlled Substance Pharmacotherapy Assessment REMS (Risk Evaluation and Mitigation Strategy)  Analgesic: ***  MME/day: *** mg/day. Pill Count: None expected due to no prior prescriptions written by  our practice. No notes on file Pharmacokinetics: Liberation and absorption (onset of action): WNL Distribution (time to peak effect): WNL Metabolism and  excretion (duration of action): WNL         Pharmacodynamics: Desired effects: Analgesia: Matthew Colon reports >50% benefit. Functional ability: Patient reports that medication allows him to accomplish basic ADLs Clinically meaningful improvement in function (CMIF): Sustained CMIF goals met Perceived effectiveness: Described as relatively effective, allowing for increase in activities of daily living (ADL) Undesirable effects: Side-effects or Adverse reactions: None reported Monitoring: [place] PMP: Online review of the past 57-monthperiod previously conducted. Not applicable at this point since we have not taken over the patient's medication management yet. List of other Serum/Urine Drug Screening Test(s):  No results found for: AMPHSCRSER, BARBSCRSER, BENZOSCRSER, COCAINSCRSER, COCAINSCRNUR, PCPSCRSER, [place] [facility], [place] of all UDS test(s) done:  Lab Results  Component Value Date   SUMMARY FINAL 03/30/2017   Last UDS on record: Summary  Date Value Ref Range Status  03/30/2017 FINAL  Final    Comment:    ==================================================================== TOXASSURE COMP DRUG ANALYSIS,UR ==================================================================== Test                             Result       Flag       Units Drug Present and Declared for Prescription Verification   Gabapentin                     PRESENT      EXPECTED Drug Present not Declared for Prescription Verification   Ibuprofen                      PRESENT      UNEXPECTED Drug Absent but Declared for Prescription Verification   Salicylate                     Not Detected UNEXPECTED    Aspirin, as indicated in the declared medication list, is not    always detected even when used as directed. ==================================================================== Test                      Result    Flag   Units      Ref Range   Creatinine               129              mg/dL      >=20 ==================================================================== Declared Medications:  The flagging and interpretation on this report are based on the  following declared medications.  Unexpected results may arise from  inaccuracies in the declared medications.  **Note: The testing scope of this panel includes these medications:  Gabapentin (Neurontin)  **Note: The testing scope of this panel does not include small to  moderate amounts of these reported medications:  Aspirin (Aspirin 81)  **Note: The testing scope of this panel does not include following  reported medications:  Budesonide  Cetirizine (Zyrtec)  Docusate (Colace)  Hydrochlorothiazide  Lisinopril  Metformin  Tadalafil ==================================================================== For clinical consultation, please call [phone] ====================================================================    UDS interpretation: No unexpected findings.          Medication Assessment Form: Patient introduced to form today Treatment compliance: Treatment may start today if patient agrees with proposed plan. Evaluation of compliance is not applicable at this point Risk Assessment  Profile: Aberrant behavior: See initial evaluations. None observed or detected today Comorbid factors increasing risk of overdose: See initial evaluation. No additional risks detected today Medical Psychology Evaluation: Please see scanned results in medical record.  ORT Scoring interpretation table:  Score <3 = Low Risk for SUD  Score between 4-7 = Moderate Risk for SUD  Score >8 = High Risk for Opioid Abuse   Risk Mitigation Strategies:  Patient opioid safety counseling: Completed today. Counseling provided to patient as per "Patient Counseling Document". Document signed by patient, attesting to counseling and understanding Patient-Prescriber Agreement (PPA): Obtained today.  Controlled substance  notification to other providers: Written and sent today.  Pharmacologic Plan: Today we may be taking over the patient's pharmacological regimen. See below.             Laboratory Chemistry  Inflammation Markers (CRP: Acute Phase) (ESR: Chronic Phase) Lab Results  Component Value Date   CRP <0.8 03/30/2017   ESRSEDRATE 31 (H) 03/30/2017                         Rheumatology Markers Lab Results  Component Value Date   ANA NEG 04/29/2014   LABURIC 5.7 04/29/2014                Renal Function Markers Lab Results  Component Value Date   BUN 21 (H) 03/30/2017   CREATININE 1.11 03/30/2017   GFRAA >60 03/30/2017   GFRNONAA >60 03/30/2017                 Hepatic Function Markers Lab Results  Component Value Date   AST 26 03/30/2017   ALT 10 (L) 03/30/2017   ALBUMIN 3.8 03/30/2017   ALKPHOS 53 03/30/2017                 Electrolytes Lab Results  Component Value Date   NA 143 03/30/2017   K 4.1 03/30/2017   CL 108 03/30/2017   CALCIUM 9.7 03/30/2017   MG 1.3 (L) 03/30/2017                        Neuropathy Markers Lab Results  Component Value Date   VITAMINB12 244 03/30/2017   HGBA1C 6.2 04/15/2017                 Bone Pathology Markers Lab Results  Component Value Date   VD25OH 16 (L) 08/18/2009   25OHVITD1 18 (L) 03/30/2017   25OHVITD2 <1.0 03/30/2017   25OHVITD3 18 03/30/2017   TESTOSTERONE 154.76 (L) 08/18/2009                         Coagulation Parameters Lab Results  Component Value Date   PLT 365 12/05/2015                 Cardiovascular Markers Lab Results  Component Value Date   HGB 14.5 12/05/2015   HCT 43.2 12/05/2015                 CA Markers No results found for: CEA, CA125, LABCA2               Note: Lab results reviewed.  Recent Diagnostic Imaging Review  Cervical Imaging: Cervical MR wo contrast: No results found for this or any previous visit. Cervical MR wo contrast: No results found for this or any previous  visit. Cervical MR w/wo contrast: No results found  for this or any previous visit. Cervical MR w contrast: No results found for this or any previous visit. Cervical CT wo contrast: No results found for this or any previous visit. Cervical CT w/wo contrast: No results found for this or any previous visit. Cervical CT w/wo contrast: No results found for this or any previous visit. Cervical CT w contrast: No results found for this or any previous visit. Cervical CT outside: No results found for this or any previous visit. Cervical DG 1 view: No results found for this or any previous visit. Cervical DG 2-3 views: No results found for this or any previous visit. Cervical DG F/E views: No results found for this or any previous visit. Cervical DG 2-3 clearing views: No results found for this or any previous visit. Cervical DG Bending/F/E views: No results found for this or any previous visit. Cervical DG complete:  Results for orders placed during the hospital encounter of 03/03/11  DG Cervical Spine Complete   Narrative *RADIOLOGY REPORT*  Clinical Data: Neck pain radiating to both shoulders.  CERVICAL SPINE - COMPLETE 4+ VIEW  Comparison: None.  Findings: No evidence of acute fracture, subluxation, or prevertebral soft tissue swelling.  Mild degenerative disc disease is seen at levels of C3-4, C5-6, and C6-7.  No other significant bone abnormality identified.  IMPRESSION:  1.  No acute findings. 2.  Mild degenerative disc disease at C3-4, C5-6, and C6-7.  Original Report Authenticated By: Marlaine Hind, M.D.   Cervical DG Myelogram views: No results found for this or any previous visit. Cervical DG Myelogram views: No results found for this or any previous visit. Cervical Discogram views: No results found for this or any previous visit.  Shoulder Imaging: Shoulder-R MR w contrast: No results found for this or any previous visit. Shoulder-L MR w contrast: No results found for  this or any previous visit. Shoulder-R MR w/wo contrast: No results found for this or any previous visit. Shoulder-L MR w/wo contrast: No results found for this or any previous visit. Shoulder-R MR wo contrast: No results found for this or any previous visit. Shoulder-L MR wo contrast: No results found for this or any previous visit. Shoulder-R CT w contrast: No results found for this or any previous visit. Shoulder-L CT w contrast: No results found for this or any previous visit. Shoulder-R CT w/wo contrast: No results found for this or any previous visit. Shoulder-L CT w/wo contrast: No results found for this or any previous visit. Shoulder-R CT wo contrast: No results found for this or any previous visit. Shoulder-L CT wo contrast: No results found for this or any previous visit. Shoulder-R DG Arthrogram: No results found for this or any previous visit. Shoulder-L DG Arthrogram: No results found for this or any previous visit. Shoulder-R DG 1 view: No results found for this or any previous visit. Shoulder-L DG 1 view: No results found for this or any previous visit. Shoulder-R DG:  Results for orders placed during the hospital encounter of 03/03/11  DG Shoulder Right   Narrative *RADIOLOGY REPORT*  Clinical Data: Right shoulder pain.  RIGHT SHOULDER - 2+ VIEW  Comparison: None.  Findings: No evidence of fracture or dislocation.  Mild degenerative spurring of the acromioclavicular joint is seen, but no other significant bone abnormality identified.  Soft tissues are unremarkable.  IMPRESSION:  1.  No acute findings. 2.  Minimal acromioclavicular degenerative spurring.  Original Report Authenticated By: Marlaine Hind, M.D.   Shoulder-L DG:  Results  for orders placed during the hospital encounter of 03/03/11  DG Shoulder Left   Narrative *RADIOLOGY REPORT*  Clinical Data: Left shoulder pain  LEFT SHOULDER - 2+ VIEW  Comparison: None.  Findings: No evidence of fracture  or dislocation.  Moderate osteoarthritis is seen involve the glenohumeral joint. Mild degenerative spurring is also seen involving the acromioclavicular joint.  No other bone lesions identified.  The soft tissues are unremarkable.  IMPRESSION:  1.  No acute findings. 2.  Moderate glenohumeral osteoarthritis. 3.  Mild acromioclavicular degenerative changes.  Original Report Authenticated By: Marlaine Hind, M.D.    Thoracic Imaging: Thoracic MR wo contrast: No results found for this or any previous visit. Thoracic MR wo contrast: No results found for this or any previous visit. Thoracic MR w/wo contrast: No results found for this or any previous visit. Thoracic MR w contrast: No results found for this or any previous visit. Thoracic CT wo contrast: No results found for this or any previous visit. Thoracic CT w/wo contrast: No results found for this or any previous visit. Thoracic CT w/wo contrast: No results found for this or any previous visit. Thoracic CT w contrast: No results found for this or any previous visit. Thoracic DG 2-3 views: No results found for this or any previous visit. Thoracic DG 4 views: No results found for this or any previous visit. Thoracic DG: No results found for this or any previous visit. Thoracic DG w/swimmers view: No results found for this or any previous visit. Thoracic DG Myelogram views: No results found for this or any previous visit. Thoracic DG Myelogram views: No results found for this or any previous visit.  Lumbosacral Imaging: Lumbar MR wo contrast: No results found for this or any previous visit. Lumbar MR wo contrast: No results found for this or any previous visit. Lumbar MR w/wo contrast: No results found for this or any previous visit. Lumbar MR w contrast: No results found for this or any previous visit. Lumbar CT wo contrast: No results found for this or any previous visit. Lumbar CT w/wo contrast: No results found for this or any  previous visit. Lumbar CT w/wo contrast: No results found for this or any previous visit. Lumbar CT w contrast: No results found for this or any previous visit. Lumbar DG 1V: No results found for this or any previous visit. Lumbar DG 1V (Clearing): No results found for this or any previous visit. Lumbar DG 2-3V (Clearing): No results found for this or any previous visit. Lumbar DG 2-3 views: No results found for this or any previous visit. Lumbar DG (Complete) 4+V: No results found for this or any previous visit. Lumbar DG F/E views: No results found for this or any previous visit. Lumbar DG Bending views: No results found for this or any previous visit. Lumbar DG Myelogram views: No results found for this or any previous visit. Lumbar DG Myelogram: No results found for this or any previous visit. Lumbar DG Myelogram: No results found for this or any previous visit. Lumbar DG Myelogram: No results found for this or any previous visit. Lumbar DG Myelogram Lumbosacral: No results found for this or any previous visit. Lumbar DG Diskogram views: No results found for this or any previous visit. Lumbar DG Diskogram views: No results found for this or any previous visit. Lumbar DG Epidurogram OP: No results found for this or any previous visit. Lumbar DG Epidurogram IP: No results found for this or any previous visit.  Sacroiliac  Joint Imaging: Sacroiliac Joint DG: No results found for this or any previous visit. Sacroiliac Joint MR w/wo contrast: No results found for this or any previous visit. Sacroiliac Joint MR wo contrast: No results found for this or any previous visit.  Spine Imaging: Whole Spine DG Myelogram views: No results found for this or any previous visit. Whole Spine MR Mets screen: No results found for this or any previous visit. Whole Spine MR Mets screen: No results found for this or any previous visit. Whole Spine MR w/wo: No results found for this or any previous  visit. MRA Spinal Canal w/ cm: No results found for this or any previous visit. MRA Spinal Canal wo/ cm: No results found for this or any previous visit. MRA Spinal Canal w/wo cm: No results found for this or any previous visit. Spine Outside MR Films: No results found for this or any previous visit. Spine Outside CT Films: No results found for this or any previous visit. CT-Guided Biopsy: No results found for this or any previous visit. CT-Guided Needle Placement: No results found for this or any previous visit. DG Spine outside: No results found for this or any previous visit. IR Spine outside: No results found for this or any previous visit. NM Spine outside: No results found for this or any previous visit. Epidurography 1: No results found for this or any previous visit. Epidurography 2: No results found for this or any previous visit.  Hip Imaging: Hip-R MR w contrast: No results found for this or any previous visit. Hip-L MR w contrast: No results found for this or any previous visit. Hip-R MR w/wo contrast: No results found for this or any previous visit. Hip-L MR w/wo contrast: No results found for this or any previous visit. Hip-R MR wo contrast: No results found for this or any previous visit. Hip-L MR wo contrast: No results found for this or any previous visit. Hip-R CT w contrast: No results found for this or any previous visit. Hip-L CT w contrast: No results found for this or any previous visit. Hip-R CT w/wo contrast: No results found for this or any previous visit. Hip-L CT w/wo contrast: No results found for this or any previous visit. Hip-R CT wo contrast: No results found for this or any previous visit. Hip-L CT wo contrast: No results found for this or any previous visit. Hip-R DG 2-3 views: No results found for this or any previous visit. Hip-L DG 2-3 views: No results found for this or any previous visit. Hip-R DG Arthrogram: No results found for this or any  previous visit. Hip-L DG Arthrogram: No results found for this or any previous visit. Hip-B DG Bilateral: No results found for this or any previous visit.  Knee Imaging: Knee-R MR w contrast: No results found for this or any previous visit. Knee-L MR w contrast: No results found for this or any previous visit. Knee-R MR w/wo contrast: No results found for this or any previous visit. Knee-L MR w/wo contrast: No results found for this or any previous visit. Knee-R MR wo contrast: No results found for this or any previous visit. Knee-L MR wo contrast: No results found for this or any previous visit. Knee-R CT w contrast: No results found for this or any previous visit. Knee-L CT w contrast: No results found for this or any previous visit. Knee-R CT w/wo contrast: No results found for this or any previous visit. Knee-L CT w/wo contrast: No results found for  this or any previous visit. Knee-R CT wo contrast: No results found for this or any previous visit. Knee-L CT wo contrast: No results found for this or any previous visit. Knee-R DG 1-2 views:  Results for orders placed during the hospital encounter of 11/19/09  DG Knee 2 Views Right   Narrative Clinical Data: Knee pain.  No trauma.   RIGHT KNEE - 1-2 VIEW   Comparison: None.   Findings:  Tricompartment degenerative changes with joint space narrowing most notable and mild to slightly moderate in degree involving the medial tibial femoral joint space. Small to moderate sized suprapatellar joint effusion.  No plain film evidence of osteo necrosis.   IMPRESSION: Degenerative changes most notable medial tibiofemoral joint space with joint effusion.  Provider: Nancy Nordmann   Knee-L DG 1-2 views:  Results for orders placed during the hospital encounter of 11/19/09  DG Knee 2 Views Left   Narrative Clinical Data: Knee pain.  No trauma.   LEFT KNEE - 1-2 VIEW   Comparison: None.   Findings: Minimal tricompartment degenerative  changes without plain film evidence of joint effusion. No plain film evidence of osteo necrosis.   IMPRESSION: Minimal tricompartment degenerative changes.  Provider: Nancy Nordmann   Knee-R DG 3 views: No results found for this or any previous visit. Knee-L DG 3 views: No results found for this or any previous visit. Knee-R DG 4 views:  Results for orders placed during the hospital encounter of 12/21/12  DG Knee Complete 4 Views Right   Narrative *RADIOLOGY REPORT*  Clinical Data: Knee pain  RIGHT KNEE - COMPLETE 4+ VIEW  Comparison: Prior radiographs of the right knee 11/19/2009  Findings: No acute fracture or malalignment.  There is a suprapatellar joint effusion which is slightly larger than seen on the prior radiographs.  Normal bony mineralization.  Mild degenerative changes with narrowing of the medial joint space and peaking of the tibial plateaus.  No significant interval progression compared to prior.  IMPRESSION:  1.  No acute fracture or malalignment. 2.  Moderate suprapatellar joint effusion. 3.  Degenerative changes without significant interval progression compared to May 2011.   Original Report Authenticated By: Jacqulynn Cadet, M.D.    Knee-L DG 4 views: No results found for this or any previous visit. Knee-R DG Arthrogram: No results found for this or any previous visit. Knee-L DG Arthrogram: No results found for this or any previous visit.  Ankle Imaging: Ankle-R DG Complete: No results found for this or any previous visit. Ankle-L DG Complete: No results found for this or any previous visit.  Foot Imaging: Foot-R DG Complete:  Results for orders placed during the hospital encounter of 08/18/14  DG Foot Complete Right   Narrative CLINICAL DATA:  Pt hit both feet with a pallet jack at his job in separate incidents 1 week apart. Pt reports injury and pain on the lateral side of each foot.  EXAM: RIGHT FOOT COMPLETE - 3+ VIEW  COMPARISON:   None.  FINDINGS: Mild hallux valgus deformity. Mild degenerative changes of the first MTP joint. Normal anatomic alignment. No evidence for acute fracture or dislocation. Plantar and posterior calcaneal spurring. Regional soft tissues unremarkable.  IMPRESSION: No acute osseous abnormality.   Electronically Signed   By: Lovey Newcomer M.D.   On: 08/18/2014 12:57    Foot-L DG Complete:  Results for orders placed during the hospital encounter of 08/18/14  DG Foot Complete Left   Narrative CLINICAL DATA:  Left foot injury at  work. Foot pain. Initial encounter.  EXAM: LEFT FOOT - COMPLETE 3+ VIEW  COMPARISON:  None.  FINDINGS: There is no evidence of fracture or dislocation. There is no evidence of arthropathy. A prominent plantar calcaneal spur is incidentally noted.  IMPRESSION: No acute findings.  Plantar calcaneal bone spur noted.   Electronically Signed   By: Earle Gell M.D.   On: 08/18/2014 12:57     Complexity Note: Imaging results reviewed. Results shared with Mr. Gillie, using Layman's terms.                         Meds   Current Outpatient Medications:  .  amoxicillin-clavulanate (AUGMENTIN) 875-125 MG tablet, Take 1 tablet by mouth 2 (two) times daily., Disp: 20 tablet, Rfl: 0 .  aspirin EC 81 MG tablet, Take 1 tablet (81 mg total) by mouth daily., Disp: 90 tablet, Rfl: 3 .  cetirizine (ZYRTEC) 10 MG tablet, Take 1 tablet (10 mg total) by mouth daily., Disp: 30 tablet, Rfl: 0 .  docusate sodium (COLACE) 100 MG capsule, Take 200 mg by mouth daily as needed for mild constipation., Disp: , Rfl:  .  gabapentin (NEURONTIN) 400 MG capsule, Take 1 capsule (400 mg total) by mouth 3 (three) times daily., Disp: 270 capsule, Rfl: 1 .  hydrochlorothiazide (HYDRODIURIL) 12.5 MG tablet, Take 1 tablet (12.5 mg total) by mouth daily., Disp: 90 tablet, Rfl: 3 .  HYDROcodone-acetaminophen (NORCO) 5-325 MG tablet, Take 1 tablet daily as needed by mouth. (Patient not taking:  Reported on 08/24/2017), Disp: 30 tablet, Rfl: 0 .  lisinopril (PRINIVIL,ZESTRIL) 40 MG tablet, Take 1 tablet (40 mg total) by mouth daily., Disp: 90 tablet, Rfl: 3 .  metFORMIN (GLUCOPHAGE) 1000 MG tablet, Take 1 tablet (1,000 mg total) by mouth 2 (two) times daily with a meal., Disp: 180 tablet, Rfl: 3 .  tadalafil (CIALIS) 20 MG tablet, Take 1 tablet (20 mg total) by mouth daily as needed for erectile dysfunction., Disp: 10 tablet, Rfl: 2 .  traMADol (ULTRAM) 50 MG tablet, Take 1-2 tablets (50-100 mg total) 3 (three) times daily as needed by mouth., Disp: 30 tablet, Rfl: 2  ROS  Constitutional: Denies any fever or chills Gastrointestinal: No reported hemesis, hematochezia, vomiting, or acute GI distress Musculoskeletal: Denies any acute onset joint swelling, redness, loss of ROM, or weakness Neurological: No reported episodes of acute onset apraxia, aphasia, dysarthria, agnosia, amnesia, paralysis, loss of coordination, or loss of consciousness  Allergies  Mr. Kimberlin has No Known Allergies.  [place]  Drug: Mr. Buckhalter  reports that he does not use drugs. Alcohol:  reports that he drinks alcohol. Tobacco:  reports that  has never smoked. he has never used smokeless tobacco. Medical:  has a past medical history of Allergy, Foot pain, bilateral, GERD (gastroesophageal reflux disease), Hyperlipidemia, Hypertension, Polyarthralgia, Type 2 diabetes mellitus with diabetic neuropathy ([place]), and Wears glasses. Surgical: Mr. Mackins  has a past surgical history that includes Colonoscopy and No past surgeries. Family: family history includes Arthritis in his sister; Diabetes in his mother, sister, sister, and sister; Heart disease in his sister; Hypertension in his mother; Other in his father.  Constitutional Exam  General appearance: Well nourished, well developed, and well hydrated. In no apparent acute distress There were no vitals filed for this visit. BMI Assessment: Estimated body mass index is  39.28 kg/m as calculated from the following:   Height as of 08/24/17: 5' 11"  (1.803 m).   Weight as  of 08/24/17: 281 lb 9.6 oz (127.7 kg).  BMI interpretation table: BMI level Category Range association with higher incidence of chronic pain  <18 kg/m2 Underweight   18.5-24.9 kg/m2 Ideal body weight   25-29.9 kg/m2 Overweight Increased incidence by 20%  30-34.9 kg/m2 Obese (Class I) Increased incidence by 68%  35-39.9 kg/m2 Severe obesity (Class II) Increased incidence by 136%  >40 kg/m2 Extreme obesity (Class III) Increased incidence by 254%   BMI Readings from Last 4 Encounters:  04/15/17 40.78 kg/m  03/30/17 40.03 kg/m  01/10/17 42.68 kg/m  09/06/16 42.37 kg/m   Wt Readings from Last 4 Encounters:  04/15/17 292 lb 6.4 oz (132.6 kg)  03/30/17 287 lb (130.2 kg)  01/10/17 (!) 306 lb (138.8 kg)  09/06/16 (!) 303 lb 12.8 oz (137.8 kg)  Psych/Mental status: Alert, oriented x 3 (person, place, & time)       Eyes: PERLA Respiratory: No evidence of acute respiratory distress  Cervical Spine Area Exam  Skin & Axial Inspection: No masses, redness, edema, swelling, or associated skin lesions Alignment: Symmetrical Functional ROM: Unrestricted ROM      Stability: No instability detected Muscle Tone/Strength: Functionally intact. No obvious neuro-muscular anomalies detected. Sensory (Neurological): Unimpaired Palpation: No palpable anomalies              Upper Extremity (UE) Exam    Side: Right upper extremity  Side: Left upper extremity  Skin & Extremity Inspection: Skin color, temperature, and hair growth are WNL. No peripheral edema or cyanosis. No masses, redness, swelling, asymmetry, or associated skin lesions. No contractures.  Skin & Extremity Inspection: Skin color, temperature, and hair growth are WNL. No peripheral edema or cyanosis. No masses, redness, swelling, asymmetry, or associated skin lesions. No contractures.  Functional ROM: Unrestricted ROM          Functional ROM:  Unrestricted ROM          Muscle Tone/Strength: Functionally intact. No obvious neuro-muscular anomalies detected.  Muscle Tone/Strength: Functionally intact. No obvious neuro-muscular anomalies detected.  Sensory (Neurological): Unimpaired          Sensory (Neurological): Unimpaired          Palpation: No palpable anomalies              Palpation: No palpable anomalies              Specialized Test(s): Deferred         Specialized Test(s): Deferred          Thoracic Spine Area Exam  Skin & Axial Inspection: No masses, redness, or swelling Alignment: Symmetrical Functional ROM: Unrestricted ROM Stability: No instability detected Muscle Tone/Strength: Functionally intact. No obvious neuro-muscular anomalies detected. Sensory (Neurological): Unimpaired Muscle strength & Tone: No palpable anomalies  Lumbar Spine Area Exam  Skin & Axial Inspection: No masses, redness, or swelling Alignment: Symmetrical Functional ROM: Unrestricted ROM      Stability: No instability detected Muscle Tone/Strength: Functionally intact. No obvious neuro-muscular anomalies detected. Sensory (Neurological): Unimpaired Palpation: No palpable anomalies       Provocative Tests: Lumbar Hyperextension and rotation test: evaluation deferred today       Lumbar Lateral bending test: evaluation deferred today       Patrick's Maneuver: evaluation deferred today                    Gait & Posture Assessment  Ambulation: Unassisted Gait: Relatively normal for age and body habitus Posture: WNL   Lower Extremity Exam  Side: Right lower extremity  Side: Left lower extremity  Skin & Extremity Inspection: Skin color, temperature, and hair growth are WNL. No peripheral edema or cyanosis. No masses, redness, swelling, asymmetry, or associated skin lesions. No contractures.  Skin & Extremity Inspection: Skin color, temperature, and hair growth are WNL. No peripheral edema or cyanosis. No masses, redness, swelling, asymmetry,  or associated skin lesions. No contractures.  Functional ROM: Unrestricted ROM          Functional ROM: Unrestricted ROM          Muscle Tone/Strength: Functionally intact. No obvious neuro-muscular anomalies detected.  Muscle Tone/Strength: Functionally intact. No obvious neuro-muscular anomalies detected.  Sensory (Neurological): Unimpaired  Sensory (Neurological): Unimpaired  Palpation: No palpable anomalies  Palpation: No palpable anomalies   Assessment & Plan  Primary Diagnosis & Pertinent Problem List: There were no encounter diagnoses.  Visit Diagnosis: No diagnosis found. Problems updated and reviewed during this visit: No problems updated.  Plan of Care  Pharmacotherapy (Medications Ordered): No orders of the defined types were placed in this encounter.  Procedure Orders    No procedure(s) ordered today   Lab Orders  No laboratory test(s) ordered today   Imaging Orders  No imaging studies ordered today   Referral Orders  No referral(s) requested today    Pharmacological management options:  Opioid Analgesics: We'll take over management today. See above orders Membrane stabilizer: We have discussed the possibility of optimizing this mode of therapy, if tolerated Muscle relaxant: We have discussed the possibility of a trial NSAID: We have discussed the possibility of a trial Other analgesic(s): To be determined at a later time   Interventional management options: Planned, scheduled, and/or pending:    ***   Considering:   ***   PRN Procedures:   None at this time   Provider-requested follow-up: No Follow-up on file.  Future Appointments  Date Time Provider Branch  08/31/2017 11:30 AM Milinda Pointer, MD [facility] None  10/13/2017  8:30 AM Clent Demark, PA-C [facility] None    Primary Care Physician: Tawny Asal Location: [facility] Note by: Gaspar Cola, MD Date: 08/31/2017; Time: 3:33  PM

## 2017-08-31 ENCOUNTER — Ambulatory Visit: Payer: No Typology Code available for payment source | Admitting: Pain Medicine

## 2017-09-14 ENCOUNTER — Ambulatory Visit: Payer: No Typology Code available for payment source | Admitting: Pain Medicine

## 2017-09-20 DIAGNOSIS — R7989 Other specified abnormal findings of blood chemistry: Secondary | ICD-10-CM | POA: Insufficient documentation

## 2017-09-20 DIAGNOSIS — M79673 Pain in unspecified foot: Secondary | ICD-10-CM

## 2017-09-20 DIAGNOSIS — G8929 Other chronic pain: Secondary | ICD-10-CM | POA: Insufficient documentation

## 2017-09-20 NOTE — Progress Notes (Deleted)
Patient's Name: Matthew Bostock Sr.  MRN: [id number]  Referring Provider: Clent Demark, PA-C  DOB: Dec 08, 1955  PCP: Clent Demark, Hershal Coria  DOS: 09/21/2017  Note by: Gaspar Cola, MD  Service setting: Ambulatory outpatient  Specialty: Interventional Pain Management  Location: [facility] (AMB) Pain Management Facility    Patient type: Established   Primary Reason(s) for Visit: Encounter for evaluation before starting new chronic pain management plan of care (Level of risk: moderate) CC: No chief complaint on file.  HPI  Matthew Colon is a 62 y.o. year old, male patient, who comes today for a follow-up evaluation to review the test results and decide on a treatment plan. He has Hypertensive disease; Type 2 diabetes mellitus with diabetic mononeuropathy ([place]); Encounter for health maintenance examination in adult; Screening for prostate cancer; Special screening for malignant neoplasms, colon; Hyperlipidemia; Nocturia; Urinary frequency; Need for prophylactic vaccination and inoculation against influenza; Need for prophylactic vaccination against Streptococcus pneumoniae (pneumococcus); Need for Tdap vaccination; Chronic nausea; Gastroesophageal reflux disease without esophagitis; Elevated PSA; Noncompliance; Chronic shoulder pain (Primary Area of Pain) (Left); Chronic knee pain [facility] Area of Pain) (Left); Disorder of bone, unspecified; Other reduced mobility; Other specified health status; Long term current use of opiate analgesic; Opiate use; Chronic pain syndrome; Chronic upper extremity pain (Secondary Area of Pain) (Bilateral); Vitamin D deficiency; Hypomagnesemia; Low testosterone; and Chronic foot pain (Bilateral) on their problem list. His primarily concern today is the No chief complaint on file.  Pain Assessment: Location:     Radiating:   Onset:   Duration:   Quality:   Severity:  /10 (self-reported pain score)  Note: Reported level is compatible with observation.                          When using our objective Pain Scale, levels between 6 and 10/10 are said to belong in an emergency room, as it progressively worsens from a 6/10, described as severely limiting, requiring emergency care not usually available at an outpatient pain management facility. At a 6/10 level, communication becomes difficult and requires great effort. Assistance to reach the emergency department may be required. Facial flushing and profuse sweating along with potentially dangerous increases in heart rate and blood pressure will be evident. Effect on ADL:   Timing:   Modifying factors:    Matthew Colon comes in today for a follow-up visit after his initial evaluation on 04/05/2017. Today we went over the results of his tests. These were explained in "Layman's terms". During today's appointment we went over my diagnostic impression, as well as the proposed treatment plan.  According to the patient his primary area of pain is in his left shoulder.he has been dealing with the pain for years and he admits that standing worse.he denies any specific accident or injury He does work as a Games developer currently. He denies any previous surgeries. He has had some interventional therapy, steroid injection a few months ago. He admits the injection last several months. He completed images in [place]. He admits that his last physical therapy was 7-8 years ago which was not effective. He has been seen or toe and was told that he needs surgery. He is not interested in surgery at this time. He has occasional right shoulder pain but not significant.  His second area of pain is in his upper extremities. He admits that he has numbness tingling and weakness that goes down his arm into fingers 4 and 5.  His third area of pain is in his left knee. He denies any previous surgeries. He has had steroid injections in the past which were effective. He has some weakness denies any swelling. He denies any recent images.  In considering  the treatment plan options, Matthew Colon was reminded that I no longer take patients for medication management only. I asked him to let me know if he had no intention of taking advantage of the interventional therapies, so that we could make arrangements to provide this space to someone interested. I also made it clear that undergoing interventional therapies for the purpose of getting pain medications is very inappropriate on the part of a patient, and it will not be tolerated in this practice. This type of behavior would suggest true addiction and therefore it requires referral to an addiction specialist.   Further details on both, my assessment(s), as well as the proposed treatment plan, please see below.  Controlled Substance Pharmacotherapy Assessment REMS (Risk Evaluation and Mitigation Strategy)  Analgesic: Tramadol 50 mg 4 times daily (last fill date 03/03/2017) tramadol 200 mg.+ hydrocodone/acetaminophen 5/325 one tablet daily (fill date 03/03/2017) hydrocodone 5 mg per day Highest recorded MME/day: 25 mg/day MME/day: 25 mg/day Pill Count: None expected due to no prior prescriptions written by our practice. No notes on file Pharmacokinetics: Liberation and absorption (onset of action): WNL Distribution (time to peak effect): WNL Metabolism and excretion (duration of action): WNL         Pharmacodynamics: Desired effects: Analgesia: Mr. Biello reports >50% benefit. Functional ability: Patient reports that medication allows him to accomplish basic ADLs Clinically meaningful improvement in function (CMIF): Sustained CMIF goals met Perceived effectiveness: Described as relatively effective, allowing for increase in activities of daily living (ADL) Undesirable effects: Side-effects or Adverse reactions: None reported Monitoring: [place] PMP: Online review of the past 77-monthperiod previously conducted. Not applicable at this point since we have not taken over the patient's medication  management yet. List of all UDS test(s) done:  Lab Results  Component Value Date   SUMMARY FINAL 03/30/2017   Last UDS on record: Summary  Date Value Ref Range Status  03/30/2017 FINAL  Final    Comment:    ==================================================================== TOXASSURE COMP DRUG ANALYSIS,UR ==================================================================== Test                             Result       Flag       Units Drug Present and Declared for Prescription Verification   Gabapentin                     PRESENT      EXPECTED Drug Present not Declared for Prescription Verification   Ibuprofen                      PRESENT      UNEXPECTED Drug Absent but Declared for Prescription Verification   Salicylate                     Not Detected UNEXPECTED    Aspirin, as indicated in the declared medication list, is not    always detected even when used as directed. ==================================================================== Test                      Result    Flag   Units      Ref Range  Creatinine              129              mg/dL      >=20 ==================================================================== Declared Medications:  The flagging and interpretation on this report are based on the  following declared medications.  Unexpected results may arise from  inaccuracies in the declared medications.  **Note: The testing scope of this panel includes these medications:  Gabapentin (Neurontin)  **Note: The testing scope of this panel does not include small to  moderate amounts of these reported medications:  Aspirin (Aspirin 81)  **Note: The testing scope of this panel does not include following  reported medications:  Budesonide  Cetirizine (Zyrtec)  Docusate (Colace)  Hydrochlorothiazide  Lisinopril  Metformin  Tadalafil ==================================================================== For clinical consultation, please call [phone]. ====================================================================    UDS interpretation: No unexpected findings.          Medication Assessment Form: Patient introduced to form today Treatment compliance: Treatment may start today if patient agrees with proposed plan. Evaluation of compliance is not applicable at this point Risk Assessment Profile: Aberrant behavior: See initial evaluations. None observed or detected today Comorbid factors increasing risk of overdose: See initial evaluation. No additional risks detected today Medical Psychology Evaluation: Please see scanned results in medical record.  ORT Scoring interpretation table:  Score <3 = Low Risk for SUD  Score between 4-7 = Moderate Risk for SUD  Score >8 = High Risk for Opioid Abuse   Risk Mitigation Strategies:  Patient opioid safety counseling: Completed today. Counseling provided to patient as per "Patient Counseling Document". Document signed by patient, attesting to counseling and understanding Patient-Prescriber Agreement (PPA): Obtained today.  Controlled substance notification to other providers: Written and sent today.  Pharmacologic Plan: Today we may be taking over the patient's pharmacological regimen. See below.             Laboratory Chemistry  Inflammation Markers (CRP: Acute Phase) (ESR: Chronic Phase) Lab Results  Component Value Date   CRP <0.8 03/30/2017   ESRSEDRATE 31 (H) 03/30/2017                         Rheumatology Markers Lab Results  Component Value Date   ANA NEG 04/29/2014   LABURIC 5.7 04/29/2014                Renal Function Markers Lab Results  Component Value Date   BUN 21 (H) 03/30/2017   CREATININE 1.11 03/30/2017   GFRAA >60 03/30/2017   GFRNONAA >60 03/30/2017                 Hepatic Function Markers Lab Results  Component Value Date   AST 26 03/30/2017   ALT 10 (L) 03/30/2017   ALBUMIN 3.8 03/30/2017   ALKPHOS 53 03/30/2017                  Electrolytes Lab Results  Component Value Date   NA 143 03/30/2017   K 4.1 03/30/2017   CL 108 03/30/2017   CALCIUM 9.7 03/30/2017   MG 1.3 (L) 03/30/2017                        Neuropathy Markers Lab Results  Component Value Date   VITAMINB12 244 03/30/2017   HGBA1C 6.2 04/15/2017  Bone Pathology Markers Lab Results  Component Value Date   VD25OH 16 (L) 08/18/2009   25OHVITD1 18 (L) 03/30/2017   25OHVITD2 <1.0 03/30/2017   25OHVITD3 18 03/30/2017   TESTOSTERONE 154.76 (L) 08/18/2009                         Coagulation Parameters Lab Results  Component Value Date   PLT 365 12/05/2015                 Cardiovascular Markers Lab Results  Component Value Date   HGB 14.5 12/05/2015   HCT 43.2 12/05/2015                 Note: Lab results reviewed.  Recent Diagnostic Imaging Review  Cervical Imaging: Cervical DG complete:  Results for orders placed during the hospital encounter of 03/03/11  DG Cervical Spine Complete   Narrative *RADIOLOGY REPORT*  Clinical Data: Neck pain radiating to both shoulders.  CERVICAL SPINE - COMPLETE 4+ VIEW  Comparison: None.  Findings: No evidence of acute fracture, subluxation, or prevertebral soft tissue swelling.  Mild degenerative disc disease is seen at levels of C3-4, C5-6, and C6-7.  No other significant bone abnormality identified.  IMPRESSION:  1.  No acute findings. 2.  Mild degenerative disc disease at C3-4, C5-6, and C6-7.  Original Report Authenticated By: Marlaine Hind, M.D.   Shoulder Imaging: Shoulder-R DG:  Results for orders placed during the hospital encounter of 03/03/11  DG Shoulder Right   Narrative *RADIOLOGY REPORT*  Clinical Data: Right shoulder pain.  RIGHT SHOULDER - 2+ VIEW  Comparison: None.  Findings: No evidence of fracture or dislocation.  Mild degenerative spurring of the acromioclavicular joint is seen, but no other significant bone abnormality identified.   Soft tissues are unremarkable.  IMPRESSION:  1.  No acute findings. 2.  Minimal acromioclavicular degenerative spurring.  Original Report Authenticated By: Marlaine Hind, M.D.   Shoulder-L DG:  Results for orders placed during the hospital encounter of 03/03/11  DG Shoulder Left   Narrative *RADIOLOGY REPORT*  Clinical Data: Left shoulder pain  LEFT SHOULDER - 2+ VIEW  Comparison: None.  Findings: No evidence of fracture or dislocation.  Moderate osteoarthritis is seen involve the glenohumeral joint. Mild degenerative spurring is also seen involving the acromioclavicular joint.  No other bone lesions identified.  The soft tissues are unremarkable.  IMPRESSION:  1.  No acute findings. 2.  Moderate glenohumeral osteoarthritis. 3.  Mild acromioclavicular degenerative changes.  Original Report Authenticated By: Marlaine Hind, M.D.   Knee Imaging: Knee-R DG 1-2 views:  Results for orders placed during the hospital encounter of 11/19/09  DG Knee 2 Views Right   Narrative Clinical Data: Knee pain.  No trauma.   RIGHT KNEE - 1-2 VIEW   Comparison: None.   Findings:  Tricompartment degenerative changes with joint space narrowing most notable and mild to slightly moderate in degree involving the medial tibial femoral joint space. Small to moderate sized suprapatellar joint effusion.  No plain film evidence of osteo necrosis.   IMPRESSION: Degenerative changes most notable medial tibiofemoral joint space with joint effusion.  Provider: Nancy Nordmann   Knee-L DG 1-2 views:  Results for orders placed during the hospital encounter of 11/19/09  DG Knee 2 Views Left   Narrative Clinical Data: Knee pain.  No trauma.   LEFT KNEE - 1-2 VIEW   Comparison: None.   Findings: Minimal tricompartment degenerative changes without  plain film evidence of joint effusion. No plain film evidence of osteo necrosis.   IMPRESSION: Minimal tricompartment degenerative  changes.  Provider: Nancy Nordmann   Knee-R DG 4 views:  Results for orders placed during the hospital encounter of 12/21/12  DG Knee Complete 4 Views Right   Narrative *RADIOLOGY REPORT*  Clinical Data: Knee pain  RIGHT KNEE - COMPLETE 4+ VIEW  Comparison: Prior radiographs of the right knee 11/19/2009  Findings: No acute fracture or malalignment.  There is a suprapatellar joint effusion which is slightly larger than seen on the prior radiographs.  Normal bony mineralization.  Mild degenerative changes with narrowing of the medial joint space and peaking of the tibial plateaus.  No significant interval progression compared to prior.  IMPRESSION:  1.  No acute fracture or malalignment. 2.  Moderate suprapatellar joint effusion. 3.  Degenerative changes without significant interval progression compared to May 2011.   Original Report Authenticated By: Jacqulynn Cadet, M.D.    Foot Imaging: Foot-R DG Complete:  Results for orders placed during the hospital encounter of 08/18/14  DG Foot Complete Right   Narrative CLINICAL DATA:  Pt hit both feet with a pallet jack at his job in separate incidents 1 week apart. Pt reports injury and pain on the lateral side of each foot.  EXAM: RIGHT FOOT COMPLETE - 3+ VIEW  COMPARISON:  None.  FINDINGS: Mild hallux valgus deformity. Mild degenerative changes of the first MTP joint. Normal anatomic alignment. No evidence for acute fracture or dislocation. Plantar and posterior calcaneal spurring. Regional soft tissues unremarkable.  IMPRESSION: No acute osseous abnormality.   Electronically Signed   By: Lovey Newcomer M.D.   On: 08/18/2014 12:57    Foot-L DG Complete:  Results for orders placed during the hospital encounter of 08/18/14  DG Foot Complete Left   Narrative CLINICAL DATA:  Left foot injury at work. Foot pain. Initial encounter.  EXAM: LEFT FOOT - COMPLETE 3+ VIEW  COMPARISON:  None.  FINDINGS: There is no  evidence of fracture or dislocation. There is no evidence of arthropathy. A prominent plantar calcaneal spur is incidentally noted.  IMPRESSION: No acute findings.  Plantar calcaneal bone spur noted.   Electronically Signed   By: Earle Gell M.D.   On: 08/18/2014 12:57    Complexity Note: Imaging results reviewed. Results shared with Mr. Weekes, using Layman's terms.                         Meds   Current Outpatient Medications:  .  amoxicillin-clavulanate (AUGMENTIN) 875-125 MG tablet, Take 1 tablet by mouth 2 (two) times daily., Disp: 20 tablet, Rfl: 0 .  aspirin EC 81 MG tablet, Take 1 tablet (81 mg total) by mouth daily., Disp: 90 tablet, Rfl: 3 .  cetirizine (ZYRTEC) 10 MG tablet, Take 1 tablet (10 mg total) by mouth daily., Disp: 30 tablet, Rfl: 0 .  docusate sodium (COLACE) 100 MG capsule, Take 200 mg by mouth daily as needed for mild constipation., Disp: , Rfl:  .  gabapentin (NEURONTIN) 400 MG capsule, Take 1 capsule (400 mg total) by mouth 3 (three) times daily., Disp: 270 capsule, Rfl: 1 .  hydrochlorothiazide (HYDRODIURIL) 12.5 MG tablet, Take 1 tablet (12.5 mg total) by mouth daily., Disp: 90 tablet, Rfl: 3 .  HYDROcodone-acetaminophen (NORCO) 5-325 MG tablet, Take 1 tablet daily as needed by mouth. (Patient not taking: Reported on 08/24/2017), Disp: 30 tablet, Rfl: 0 .  lisinopril (PRINIVIL,ZESTRIL)  40 MG tablet, Take 1 tablet (40 mg total) by mouth daily., Disp: 90 tablet, Rfl: 3 .  metFORMIN (GLUCOPHAGE) 1000 MG tablet, Take 1 tablet (1,000 mg total) by mouth 2 (two) times daily with a meal., Disp: 180 tablet, Rfl: 3 .  tadalafil (CIALIS) 20 MG tablet, Take 1 tablet (20 mg total) by mouth daily as needed for erectile dysfunction., Disp: 10 tablet, Rfl: 2 .  traMADol (ULTRAM) 50 MG tablet, Take 1-2 tablets (50-100 mg total) 3 (three) times daily as needed by mouth., Disp: 30 tablet, Rfl: 2  ROS  Constitutional: Denies any fever or chills Gastrointestinal: No reported  hemesis, hematochezia, vomiting, or acute GI distress Musculoskeletal: Denies any acute onset joint swelling, redness, loss of ROM, or weakness Neurological: No reported episodes of acute onset apraxia, aphasia, dysarthria, agnosia, amnesia, paralysis, loss of coordination, or loss of consciousness  Allergies  Mr. Retana has No Known Allergies.  [place]  Drug: Mr. Montminy  reports that he does not use drugs. Alcohol:  reports that he drinks alcohol. Tobacco:  reports that  has never smoked. he has never used smokeless tobacco. Medical:  has a past medical history of Allergy, Foot pain, bilateral, GERD (gastroesophageal reflux disease), Hyperlipidemia, Hypertension, Polyarthralgia, Type 2 diabetes mellitus with diabetic neuropathy ([place]), and Wears glasses. Surgical: Mr. Scipio  has a past surgical history that includes Colonoscopy and No past surgeries. Family: family history includes Arthritis in his sister; Diabetes in his mother, sister, sister, and sister; Heart disease in his sister; Hypertension in his mother; Other in his father.  Constitutional Exam  General appearance: Well nourished, well developed, and well hydrated. In no apparent acute distress There were no vitals filed for this visit. BMI Assessment: Estimated body mass index is 39.28 kg/m as calculated from the following:   Height as of 08/24/17: 5' 11" (1.803 m).   Weight as of 08/24/17: 281 lb 9.6 oz (127.7 kg).  BMI interpretation table: BMI level Category Range association with higher incidence of chronic pain  <18 kg/m2 Underweight   18.5-24.9 kg/m2 Ideal body weight   25-29.9 kg/m2 Overweight Increased incidence by 20%  30-34.9 kg/m2 Obese (Class I) Increased incidence by 68%  35-39.9 kg/m2 Severe obesity (Class II) Increased incidence by 136%  >40 kg/m2 Extreme obesity (Class III) Increased incidence by 254%   BMI Readings from Last 4 Encounters:  04/15/17 40.78 kg/m  03/30/17 40.03 kg/m  01/10/17 42.68 kg/m   09/06/16 42.37 kg/m   Wt Readings from Last 4 Encounters:  04/15/17 292 lb 6.4 oz (132.6 kg)  03/30/17 287 lb (130.2 kg)  01/10/17 (!) 306 lb (138.8 kg)  09/06/16 (!) 303 lb 12.8 oz (137.8 kg)  Psych/Mental status: Alert, oriented x 3 (person, place, & time)       Eyes: PERLA Respiratory: No evidence of acute respiratory distress  Cervical Spine Area Exam  Skin & Axial Inspection: No masses, redness, edema, swelling, or associated skin lesions Alignment: Symmetrical Functional ROM: Unrestricted ROM      Stability: No instability detected Muscle Tone/Strength: Functionally intact. No obvious neuro-muscular anomalies detected. Sensory (Neurological): Unimpaired Palpation: No palpable anomalies              Upper Extremity (UE) Exam    Side: Right upper extremity  Side: Left upper extremity  Skin & Extremity Inspection: Skin color, temperature, and hair growth are WNL. No peripheral edema or cyanosis. No masses, redness, swelling, asymmetry, or associated skin lesions. No contractures.  Skin & Extremity Inspection: Skin  color, temperature, and hair growth are WNL. No peripheral edema or cyanosis. No masses, redness, swelling, asymmetry, or associated skin lesions. No contractures.  Functional ROM: Unrestricted ROM          Functional ROM: Unrestricted ROM          Muscle Tone/Strength: Functionally intact. No obvious neuro-muscular anomalies detected.  Muscle Tone/Strength: Functionally intact. No obvious neuro-muscular anomalies detected.  Sensory (Neurological): Unimpaired          Sensory (Neurological): Unimpaired          Palpation: No palpable anomalies              Palpation: No palpable anomalies              Specialized Test(s): Deferred         Specialized Test(s): Deferred          Thoracic Spine Area Exam  Skin & Axial Inspection: No masses, redness, or swelling Alignment: Symmetrical Functional ROM: Unrestricted ROM Stability: No instability detected Muscle  Tone/Strength: Functionally intact. No obvious neuro-muscular anomalies detected. Sensory (Neurological): Unimpaired Muscle strength & Tone: No palpable anomalies  Lumbar Spine Area Exam  Skin & Axial Inspection: No masses, redness, or swelling Alignment: Symmetrical Functional ROM: Unrestricted ROM      Stability: No instability detected Muscle Tone/Strength: Functionally intact. No obvious neuro-muscular anomalies detected. Sensory (Neurological): Unimpaired Palpation: No palpable anomalies       Provocative Tests: Lumbar Hyperextension and rotation test: evaluation deferred today       Lumbar Lateral bending test: evaluation deferred today       Patrick's Maneuver: evaluation deferred today                    Gait & Posture Assessment  Ambulation: Unassisted Gait: Relatively normal for age and body habitus Posture: WNL   Lower Extremity Exam    Side: Right lower extremity  Side: Left lower extremity  Skin & Extremity Inspection: Skin color, temperature, and hair growth are WNL. No peripheral edema or cyanosis. No masses, redness, swelling, asymmetry, or associated skin lesions. No contractures.  Skin & Extremity Inspection: Skin color, temperature, and hair growth are WNL. No peripheral edema or cyanosis. No masses, redness, swelling, asymmetry, or associated skin lesions. No contractures.  Functional ROM: Unrestricted ROM          Functional ROM: Unrestricted ROM          Muscle Tone/Strength: Functionally intact. No obvious neuro-muscular anomalies detected.  Muscle Tone/Strength: Functionally intact. No obvious neuro-muscular anomalies detected.  Sensory (Neurological): Unimpaired  Sensory (Neurological): Unimpaired  Palpation: No palpable anomalies  Palpation: No palpable anomalies   Assessment & Plan  Primary Diagnosis & Pertinent Problem List: The primary encounter diagnosis was Chronic pain syndrome. Diagnoses of Chronic shoulder pain (Primary Area of Pain) (Left), Chronic  upper extremity pain (Secondary Area of Pain) (Bilateral), Chronic knee pain (Tertiary Area of Pain) (Left), and Chronic foot pain (Bilateral) were also pertinent to this visit.  Visit Diagnosis: 1. Chronic pain syndrome   2. Chronic shoulder pain (Primary Area of Pain) (Left)   3. Chronic upper extremity pain (Secondary Area of Pain) (Bilateral)   4. Chronic knee pain [facility] Area of Pain) (Left)   5. Chronic foot pain (Bilateral)    Problems updated and reviewed during this visit: Problem  Chronic foot pain (Bilateral)  Chronic shoulder pain (Primary Area of Pain) (Left)  Chronic knee pain (Tertiary Area of Pain) (Left)  Chronic Pain  Syndrome  Chronic upper extremity pain (Secondary Area of Pain) (Bilateral)  Hypomagnesemia  Low Testosterone  Vitamin D Deficiency  Disorder of Bone, Unspecified  Other Reduced Mobility  Other Specified Health Status  Long Term Current Use of Opiate Analgesic  Opiate Use  Noncompliance  Elevated Psa  Hypertensive Disease  Type 2 Diabetes Mellitus With Diabetic Mononeuropathy ([facility])  Encounter for Health Maintenance Examination in Adult  Screening for Prostate Cancer  Special Screening for Malignant Neoplasms, Colon  Hyperlipidemia  Nocturia  Urinary Frequency  Need for Prophylactic Vaccination and Inoculation Against Influenza  Need for Prophylactic Vaccination Against Streptococcus Pneumoniae (Pneumococcus)  Need for Tdap Vaccination  Chronic Nausea  Gastroesophageal Reflux Disease Without Esophagitis    Plan of Care  Pharmacotherapy (Medications Ordered): No orders of the defined types were placed in this encounter.  Procedure Orders    No procedure(s) ordered today   Lab Orders  No laboratory test(s) ordered today   Imaging Orders  No imaging studies ordered today   Referral Orders  No referral(s) requested today    Pharmacological management options:  Opioid Analgesics: We'll take over management today. See above  orders Membrane stabilizer: We have discussed the possibility of optimizing this mode of therapy, if tolerated Muscle relaxant: We have discussed the possibility of a trial NSAID: We have discussed the possibility of a trial Other analgesic(s): To be determined at a later time   Interventional management options: Planned, scheduled, and/or pending:    ***   Considering:   Diagnostic Left intra-articular shoulder injection Diagnostic Left suprascapular nerve block Possible left suprascapular radiofrequency ablation Diagnostic left intra-articular knee injection Diagnostic left genicular nerve block Possible left knee radiofrequency ablation   PRN Procedures:   None at this time   Provider-requested follow-up: No Follow-up on file.  Future Appointments  Date Time Provider [place]  09/21/2017  8:30 AM Milinda Pointer, MD [facility] None  10/13/2017  8:30 AM Clent Demark, PA-C [facility] None    Primary Care Physician: Tawny Asal Location: [facility] Note by: Gaspar Cola, MD Date: 09/21/2017; Time: 4:27 PM

## 2017-09-21 ENCOUNTER — Ambulatory Visit: Payer: No Typology Code available for payment source | Admitting: Pain Medicine

## 2017-09-21 DIAGNOSIS — M19011 Primary osteoarthritis, right shoulder: Secondary | ICD-10-CM | POA: Insufficient documentation

## 2017-09-21 DIAGNOSIS — M19012 Primary osteoarthritis, left shoulder: Secondary | ICD-10-CM | POA: Insufficient documentation

## 2017-09-21 DIAGNOSIS — M5030 Other cervical disc degeneration, unspecified cervical region: Secondary | ICD-10-CM | POA: Insufficient documentation

## 2017-09-21 DIAGNOSIS — M7732 Calcaneal spur, left foot: Secondary | ICD-10-CM

## 2017-09-21 DIAGNOSIS — M170 Bilateral primary osteoarthritis of knee: Secondary | ICD-10-CM | POA: Insufficient documentation

## 2017-09-21 DIAGNOSIS — M7731 Calcaneal spur, right foot: Secondary | ICD-10-CM | POA: Insufficient documentation

## 2017-09-21 DIAGNOSIS — M19019 Primary osteoarthritis, unspecified shoulder: Secondary | ICD-10-CM | POA: Insufficient documentation

## 2017-09-21 DIAGNOSIS — M25469 Effusion, unspecified knee: Secondary | ICD-10-CM | POA: Insufficient documentation

## 2017-10-13 ENCOUNTER — Ambulatory Visit (INDEPENDENT_AMBULATORY_CARE_PROVIDER_SITE_OTHER): Payer: No Typology Code available for payment source | Admitting: Physician Assistant

## 2017-10-16 NOTE — Progress Notes (Deleted)
Patient's Name: Matthew Bretado Sr.  MRN: [id number]  Referring Provider: Clent Demark, PA-C  DOB: 03-06-1956  PCP: Matthew Demark, PA-C  DOS: 10/17/2017  Note by: Gaspar Cola, MD  Service setting: Ambulatory outpatient  Specialty: Interventional Pain Management  Location: [facility] (AMB) Pain Management Facility    Patient type: Established   Primary Reason(s) for Visit: Encounter for evaluation before starting new chronic pain management plan of care (Level of risk: moderate) CC: No chief complaint on file.  HPI  Matthew Colon is a 62 y.o. year old, male patient, who comes today for a follow-up evaluation to review the test results and decide on a treatment plan. He has Hypertensive disease; Type 2 diabetes mellitus with diabetic mononeuropathy ([place]); Encounter for health maintenance examination in adult; Screening for prostate cancer; Special screening for malignant neoplasms, colon; Hyperlipidemia; Nocturia; Urinary frequency; Need for prophylactic vaccination and inoculation against influenza; Need for prophylactic vaccination against Streptococcus pneumoniae (pneumococcus); Need for Tdap vaccination; Chronic nausea; Gastroesophageal reflux disease without esophagitis; Elevated PSA; Noncompliance; Chronic shoulder pain (Primary Area of Pain) (Left); Chronic knee pain [facility] Area of Pain) (Left); Disorder of bone, unspecified; Other reduced mobility; Other specified health status; Long term current use of opiate analgesic; Opiate use; Chronic pain syndrome; Chronic upper extremity pain (Secondary Area of Pain) (Bilateral); Vitamin D deficiency; Hypomagnesemia; Low testosterone; Chronic foot pain (Bilateral); DDD (degenerative disc disease), cervical; DJD of acromioclavicular joint (Right); DJD of acromioclavicular joint (Bilateral); DJD of acromioclavicular joint (Left); Osteoarthritis of glenohumeral joint (Left); Osteoarthritis of shoulder (Left); Tricompartmental disease of knee  (Bilateral); Osteoarthritis of knees (Bilateral); Suprapatellar effusion of knee (recurrent) (Right); and Calcaneal spur of feet (Bilateral) on their problem list. His primarily concern today is the No chief complaint on file.  Pain Assessment: Location:     Radiating:   Onset:   Duration:   Quality:   Severity:  /10 (self-reported pain score)  Note: Reported level is compatible with observation.                         When using our objective Pain Scale, levels between 6 and 10/10 are said to belong in an emergency room, as it progressively worsens from a 6/10, described as severely limiting, requiring emergency care not usually available at an outpatient pain management facility. At a 6/10 level, communication becomes difficult and requires great effort. Assistance to reach the emergency department may be required. Facial flushing and profuse sweating along with potentially dangerous increases in heart rate and blood pressure will be evident. Effect on ADL:   Timing:   Modifying factors:    Matthew Colon comes in today for a follow-up visit after his initial evaluation on Visit date not found. Today we went over the results of his tests. These were explained in "Layman's terms". During today's appointment we went over my diagnostic impression, as well as the proposed treatment plan.  According to the patient his primary area of pain is in his left shoulder.he has been dealing with the pain for years and he admits that standing worse.he denies any specific accident or injury He does work as a Games developer currently. He denies any previous surgeries. He has had some interventional therapy, steroid injection a few months ago. He admits the injection last several months. He completed images in [place]. He admits that his last physical therapy was 7-8 years ago which was not effective. He has been seen or toe and was told  that he needs surgery. He is not interested in surgery at this time. He has  occasional right shoulder pain but not significant.  His second area of pain is in his upper extremities. He admits that he has numbness tingling and weakness that goes down his arm into fingers 4 and 5.  His third area of pain is in his left knee. He denies any previous surgeries. He has had steroid injections in the past which were effective. He has some weakness denies any swelling. He denies any recent images.  In considering the treatment plan options, Matthew Colon was reminded that I no longer take patients for medication management only. I asked him to let me know if he had no intention of taking advantage of the interventional therapies, so that we could make arrangements to provide this space to someone interested. I also made it clear that undergoing interventional therapies for the purpose of getting pain medications is very inappropriate on the part of a patient, and it will not be tolerated in this practice. This type of behavior would suggest true addiction and therefore it requires referral to an addiction specialist.   Further details on both, my assessment(s), as well as the proposed treatment plan, please see below.  Controlled Substance Pharmacotherapy Assessment REMS (Risk Evaluation and Mitigation Strategy)  Analgesic: tramadol 50 mg 4 times daily (last fill date 03/03/2017) tramadol 200 mg.+ hydrocodone/acetaminophen 5/325 one tablet daily (fill date 03/03/2017) hydrocodone 5 mg per day Highest recorded MME/day: 25 mg/day MME/day: 25 mg/day Pill Count: None expected due to no prior prescriptions written by our practice. No notes on file Pharmacokinetics: Liberation and absorption (onset of action): WNL Distribution (time to peak effect): WNL Metabolism and excretion (duration of action): WNL         Pharmacodynamics: Desired effects: Analgesia: Matthew Colon reports >50% benefit. Functional ability: Patient reports that medication allows him to accomplish basic  ADLs Clinically meaningful improvement in function (CMIF): Sustained CMIF goals met Perceived effectiveness: Described as relatively effective, allowing for increase in activities of daily living (ADL) Undesirable effects: Side-effects or Adverse reactions: None reported Monitoring: [place] PMP: Online review of the past 69-monthperiod previously conducted. Not applicable at this point since we have not taken over the patient's medication management yet. List of other Serum/Urine Drug Screening Test(s):  No results found for: AMPHSCRSER, BARBSCRSER, BENZOSCRSER, COCAINSCRSER, COCAINSCRNUR, PCPSCRSER, THCSCRSER, THCU, CANNABQUANT, [place] ESebastianList of all UDS test(s) done:  Lab Results  Component Value Date   SUMMARY FINAL 03/30/2017   Last UDS on record: Summary  Date Value Ref Range Status  03/30/2017 FINAL  Final    Comment:    ==================================================================== TOXASSURE COMP DRUG ANALYSIS,UR ==================================================================== Test                             Result       Flag       Units Drug Present and Declared for Prescription Verification   Gabapentin                     PRESENT      EXPECTED Drug Present not Declared for Prescription Verification   Ibuprofen                      PRESENT      UNEXPECTED Drug Absent but Declared for Prescription Verification   Salicylate  Not Detected UNEXPECTED    Aspirin, as indicated in the declared medication list, is not    always detected even when used as directed. ==================================================================== Test                      Result    Flag   Units      Ref Range   Creatinine              129              mg/dL      >=20 ==================================================================== Declared Medications:  The flagging and interpretation on this report are based on the  following  declared medications.  Unexpected results may arise from  inaccuracies in the declared medications.  **Note: The testing scope of this panel includes these medications:  Gabapentin (Neurontin)  **Note: The testing scope of this panel does not include small to  moderate amounts of these reported medications:  Aspirin (Aspirin 81)  **Note: The testing scope of this panel does not include following  reported medications:  Budesonide  Cetirizine (Zyrtec)  Docusate (Colace)  Hydrochlorothiazide  Lisinopril  Metformin  Tadalafil ==================================================================== For clinical consultation, please call [phone]. ====================================================================    UDS interpretation: No unexpected findings.          Medication Assessment Form: Patient introduced to form today Treatment compliance: Treatment may start today if patient agrees with proposed plan. Evaluation of compliance is not applicable at this point Risk Assessment Profile: Aberrant behavior: See initial evaluations. None observed or detected today Comorbid factors increasing risk of overdose: See initial evaluation. No additional risks detected today Medical Psychology Evaluation: Please see scanned results in medical record.  ORT Scoring interpretation table:  Score <3 = Low Risk for SUD  Score between 4-7 = Moderate Risk for SUD  Score >8 = High Risk for Opioid Abuse   Risk Mitigation Strategies:  Patient opioid safety counseling: Completed today. Counseling provided to patient as per "Patient Counseling Document". Document signed by patient, attesting to counseling and understanding Patient-Prescriber Agreement (PPA): Obtained today.  Controlled substance notification to other providers: Written and sent today.  Pharmacologic Plan: Today we may be taking over the patient's pharmacological regimen. See below.             Laboratory Chemistry   Inflammation Markers (CRP: Acute Phase) (ESR: Chronic Phase) Lab Results  Component Value Date   CRP <0.8 03/30/2017   ESRSEDRATE 31 (H) 03/30/2017                         Rheumatology Markers Lab Results  Component Value Date   ANA NEG 04/29/2014   LABURIC 5.7 04/29/2014                        Renal Function Markers Lab Results  Component Value Date   BUN 21 (H) 03/30/2017   CREATININE 1.11 03/30/2017   GFRAA >60 03/30/2017   GFRNONAA >60 03/30/2017                              Hepatic Function Markers Lab Results  Component Value Date   AST 26 03/30/2017   ALT 10 (L) 03/30/2017   ALBUMIN 3.8 03/30/2017   ALKPHOS 53 03/30/2017  Electrolytes Lab Results  Component Value Date   NA 143 03/30/2017   K 4.1 03/30/2017   CL 108 03/30/2017   CALCIUM 9.7 03/30/2017   MG 1.3 (L) 03/30/2017                        Neuropathy Markers Lab Results  Component Value Date   VITAMINB12 244 03/30/2017   HGBA1C 6.2 04/15/2017                        Bone Pathology Markers Lab Results  Component Value Date   VD25OH 16 (L) 08/18/2009   25OHVITD1 18 (L) 03/30/2017   25OHVITD2 <1.0 03/30/2017   25OHVITD3 18 03/30/2017   TESTOSTERONE 154.76 (L) 08/18/2009                         Coagulation Parameters Lab Results  Component Value Date   PLT 365 12/05/2015                        Cardiovascular Markers Lab Results  Component Value Date   HGB 14.5 12/05/2015   HCT 43.2 12/05/2015                         Note: Lab results reviewed.  Recent Diagnostic Imaging Review  Cervical Imaging: Cervical DG complete:  Results for orders placed during the hospital encounter of 03/03/11  DG Cervical Spine Complete   Narrative *RADIOLOGY REPORT*  Clinical Data: Neck pain radiating to both shoulders.  CERVICAL SPINE - COMPLETE 4+ VIEW  Comparison: None.  Findings: No evidence of acute fracture, subluxation, or prevertebral soft tissue  swelling.  Mild degenerative disc disease is seen at levels of C3-4, C5-6, and C6-7.  No other significant bone abnormality identified.  IMPRESSION:  1.  No acute findings. 2.  Mild degenerative disc disease at C3-4, C5-6, and C6-7.  Original Report Authenticated By: Marlaine Hind, M.D.   Shoulder Imaging: Shoulder-R DG:  Results for orders placed during the hospital encounter of 03/03/11  DG Shoulder Right   Narrative *RADIOLOGY REPORT*  Clinical Data: Right shoulder pain.  RIGHT SHOULDER - 2+ VIEW  Comparison: None.  Findings: No evidence of fracture or dislocation.  Mild degenerative spurring of the acromioclavicular joint is seen, but no other significant bone abnormality identified.  Soft tissues are unremarkable.  IMPRESSION:  1.  No acute findings. 2.  Minimal acromioclavicular degenerative spurring.  Original Report Authenticated By: Marlaine Hind, M.D.   Shoulder-L DG:  Results for orders placed during the hospital encounter of 03/03/11  DG Shoulder Left   Narrative *RADIOLOGY REPORT*  Clinical Data: Left shoulder pain  LEFT SHOULDER - 2+ VIEW  Comparison: None.  Findings: No evidence of fracture or dislocation.  Moderate osteoarthritis is seen involve the glenohumeral joint. Mild degenerative spurring is also seen involving the acromioclavicular joint.  No other bone lesions identified.  The soft tissues are unremarkable.  IMPRESSION:  1.  No acute findings. 2.  Moderate glenohumeral osteoarthritis. 3.  Mild acromioclavicular degenerative changes.  Original Report Authenticated By: Marlaine Hind, M.D.   Knee Imaging: Knee-R DG 1-2 views:  Results for orders placed during the hospital encounter of 11/19/09  DG Knee 2 Views Right   Narrative Clinical Data: Knee pain.  No trauma.   RIGHT KNEE - 1-2 VIEW   Comparison: None.  Findings:  Tricompartment degenerative changes with joint space narrowing most notable and mild to slightly  moderate in degree involving the medial tibial femoral joint space. Small to moderate sized suprapatellar joint effusion.  No plain film evidence of osteo necrosis.   IMPRESSION: Degenerative changes most notable medial tibiofemoral joint space with joint effusion.  Provider: Nancy Nordmann   Knee-L DG 1-2 views:  Results for orders placed during the hospital encounter of 11/19/09  DG Knee 2 Views Left   Narrative Clinical Data: Knee pain.  No trauma.   LEFT KNEE - 1-2 VIEW   Comparison: None.   Findings: Minimal tricompartment degenerative changes without plain film evidence of joint effusion. No plain film evidence of osteo necrosis.   IMPRESSION: Minimal tricompartment degenerative changes.  Provider: Nancy Nordmann   Knee-R DG 4 views:  Results for orders placed during the hospital encounter of 12/21/12  DG Knee Complete 4 Views Right   Narrative *RADIOLOGY REPORT*  Clinical Data: Knee pain  RIGHT KNEE - COMPLETE 4+ VIEW  Comparison: Prior radiographs of the right knee 11/19/2009  Findings: No acute fracture or malalignment.  There is a suprapatellar joint effusion which is slightly larger than seen on the prior radiographs.  Normal bony mineralization.  Mild degenerative changes with narrowing of the medial joint space and peaking of the tibial plateaus.  No significant interval progression compared to prior.  IMPRESSION:  1.  No acute fracture or malalignment. 2.  Moderate suprapatellar joint effusion. 3.  Degenerative changes without significant interval progression compared to May 2011.   Original Report Authenticated By: Jacqulynn Cadet, M.D.    Foot Imaging: Foot-R DG Complete:  Results for orders placed during the hospital encounter of 08/18/14  DG Foot Complete Right   Narrative CLINICAL DATA:  Pt hit both feet with a pallet jack at his job in separate incidents 1 week apart. Pt reports injury and pain on the lateral side of each  foot.  EXAM: RIGHT FOOT COMPLETE - 3+ VIEW  COMPARISON:  None.  FINDINGS: Mild hallux valgus deformity. Mild degenerative changes of the first MTP joint. Normal anatomic alignment. No evidence for acute fracture or dislocation. Plantar and posterior calcaneal spurring. Regional soft tissues unremarkable.  IMPRESSION: No acute osseous abnormality.   Electronically Signed   By: Lovey Newcomer M.D.   On: 08/18/2014 12:57    Foot-L DG Complete:  Results for orders placed during the hospital encounter of 08/18/14  DG Foot Complete Left   Narrative CLINICAL DATA:  Left foot injury at work. Foot pain. Initial encounter.  EXAM: LEFT FOOT - COMPLETE 3+ VIEW  COMPARISON:  None.  FINDINGS: There is no evidence of fracture or dislocation. There is no evidence of arthropathy. A prominent plantar calcaneal spur is incidentally noted.  IMPRESSION: No acute findings.  Plantar calcaneal bone spur noted.   Electronically Signed   By: Earle Gell M.D.   On: 08/18/2014 12:57    Complexity Note: Imaging results reviewed. Results shared with Mr. Lagman, using Layman's terms.                         Meds   Current Outpatient Medications:  .  amoxicillin-clavulanate (AUGMENTIN) 875-125 MG tablet, Take 1 tablet by mouth 2 (two) times daily., Disp: 20 tablet, Rfl: 0 .  aspirin EC 81 MG tablet, Take 1 tablet (81 mg total) by mouth daily., Disp: 90 tablet, Rfl: 3 .  cetirizine (ZYRTEC) 10 MG tablet, Take 1  tablet (10 mg total) by mouth daily., Disp: 30 tablet, Rfl: 0 .  docusate sodium (COLACE) 100 MG capsule, Take 200 mg by mouth daily as needed for mild constipation., Disp: , Rfl:  .  gabapentin (NEURONTIN) 400 MG capsule, Take 1 capsule (400 mg total) by mouth 3 (three) times daily., Disp: 270 capsule, Rfl: 1 .  hydrochlorothiazide (HYDRODIURIL) 12.5 MG tablet, Take 1 tablet (12.5 mg total) by mouth daily., Disp: 90 tablet, Rfl: 3 .  HYDROcodone-acetaminophen (NORCO) 5-325 MG tablet,  Take 1 tablet daily as needed by mouth. (Patient not taking: Reported on 08/24/2017), Disp: 30 tablet, Rfl: 0 .  lisinopril (PRINIVIL,ZESTRIL) 40 MG tablet, Take 1 tablet (40 mg total) by mouth daily., Disp: 90 tablet, Rfl: 3 .  metFORMIN (GLUCOPHAGE) 1000 MG tablet, Take 1 tablet (1,000 mg total) by mouth 2 (two) times daily with a meal., Disp: 180 tablet, Rfl: 3 .  tadalafil (CIALIS) 20 MG tablet, Take 1 tablet (20 mg total) by mouth daily as needed for erectile dysfunction., Disp: 10 tablet, Rfl: 2 .  traMADol (ULTRAM) 50 MG tablet, Take 1-2 tablets (50-100 mg total) 3 (three) times daily as needed by mouth., Disp: 30 tablet, Rfl: 2  ROS  Constitutional: Denies any fever or chills Gastrointestinal: No reported hemesis, hematochezia, vomiting, or acute GI distress Musculoskeletal: Denies any acute onset joint swelling, redness, loss of ROM, or weakness Neurological: No reported episodes of acute onset apraxia, aphasia, dysarthria, agnosia, amnesia, paralysis, loss of coordination, or loss of consciousness  Allergies  Mr. Hua has No Known Allergies.  [place]  Drug: Mr. Carrigan  reports that he does not use drugs. Alcohol:  reports that he drinks alcohol. Tobacco:  reports that he has never smoked. He has never used smokeless tobacco. Medical:  has a past medical history of Allergy, Foot pain, bilateral, GERD (gastroesophageal reflux disease), Hyperlipidemia, Hypertension, Polyarthralgia, Type 2 diabetes mellitus with diabetic neuropathy ([place]), and Wears glasses. Surgical: Mr. Mastrangelo  has a past surgical history that includes Colonoscopy and No past surgeries. Family: family history includes Arthritis in his sister; Diabetes in his mother, sister, sister, and sister; Heart disease in his sister; Hypertension in his mother; Other in his father.  Constitutional Exam  General appearance: Well nourished, well developed, and well hydrated. In no apparent acute distress There were no vitals filed  for this visit. BMI Assessment: Estimated body mass index is 39.28 kg/m as calculated from the following:   Height as of 08/24/17: 5' 11"  (1.803 m).   Weight as of 08/24/17: 281 lb 9.6 oz (127.7 kg).  BMI interpretation table: BMI level Category Range association with higher incidence of chronic pain  <18 kg/m2 Underweight   18.5-24.9 kg/m2 Ideal body weight   25-29.9 kg/m2 Overweight Increased incidence by 20%  30-34.9 kg/m2 Obese (Class I) Increased incidence by 68%  35-39.9 kg/m2 Severe obesity (Class II) Increased incidence by 136%  >40 kg/m2 Extreme obesity (Class III) Increased incidence by 254%   BMI Readings from Last 4 Encounters:  04/15/17 40.78 kg/m  03/30/17 40.03 kg/m  01/10/17 42.68 kg/m  09/06/16 42.37 kg/m   Wt Readings from Last 4 Encounters:  04/15/17 292 lb 6.4 oz (132.6 kg)  03/30/17 287 lb (130.2 kg)  01/10/17 (!) 306 lb (138.8 kg)  09/06/16 (!) 303 lb 12.8 oz (137.8 kg)  Psych/Mental status: Alert, oriented x 3 (person, place, & time)       Eyes: PERLA Respiratory: No evidence of acute respiratory distress  Cervical Spine Area  Exam  Skin & Axial Inspection: No masses, redness, edema, swelling, or associated skin lesions Alignment: Symmetrical Functional ROM: Unrestricted ROM      Stability: No instability detected Muscle Tone/Strength: Functionally intact. No obvious neuro-muscular anomalies detected. Sensory (Neurological): Unimpaired Palpation: No palpable anomalies              Upper Extremity (UE) Exam    Side: Right upper extremity  Side: Left upper extremity  Skin & Extremity Inspection: Skin color, temperature, and hair growth are WNL. No peripheral edema or cyanosis. No masses, redness, swelling, asymmetry, or associated skin lesions. No contractures.  Skin & Extremity Inspection: Skin color, temperature, and hair growth are WNL. No peripheral edema or cyanosis. No masses, redness, swelling, asymmetry, or associated skin lesions. No  contractures.  Functional ROM: Unrestricted ROM          Functional ROM: Unrestricted ROM          Muscle Tone/Strength: Functionally intact. No obvious neuro-muscular anomalies detected.  Muscle Tone/Strength: Functionally intact. No obvious neuro-muscular anomalies detected.  Sensory (Neurological): Unimpaired          Sensory (Neurological): Unimpaired          Palpation: No palpable anomalies              Palpation: No palpable anomalies              Specialized Test(s): Deferred         Specialized Test(s): Deferred          Thoracic Spine Area Exam  Skin & Axial Inspection: No masses, redness, or swelling Alignment: Symmetrical Functional ROM: Unrestricted ROM Stability: No instability detected Muscle Tone/Strength: Functionally intact. No obvious neuro-muscular anomalies detected. Sensory (Neurological): Unimpaired Muscle strength & Tone: No palpable anomalies  Lumbar Spine Area Exam  Skin & Axial Inspection: No masses, redness, or swelling Alignment: Symmetrical Functional ROM: Unrestricted ROM      Stability: No instability detected Muscle Tone/Strength: Functionally intact. No obvious neuro-muscular anomalies detected. Sensory (Neurological): Unimpaired Palpation: No palpable anomalies       Provocative Tests: Lumbar Hyperextension and rotation test: evaluation deferred today       Lumbar Lateral bending test: evaluation deferred today       Patrick's Maneuver: evaluation deferred today                    Gait & Posture Assessment  Ambulation: Unassisted Gait: Relatively normal for age and body habitus Posture: WNL   Lower Extremity Exam    Side: Right lower extremity  Side: Left lower extremity  Skin & Extremity Inspection: Skin color, temperature, and hair growth are WNL. No peripheral edema or cyanosis. No masses, redness, swelling, asymmetry, or associated skin lesions. No contractures.  Skin & Extremity Inspection: Skin color, temperature, and hair growth are WNL.  No peripheral edema or cyanosis. No masses, redness, swelling, asymmetry, or associated skin lesions. No contractures.  Functional ROM: Unrestricted ROM          Functional ROM: Unrestricted ROM          Muscle Tone/Strength: Functionally intact. No obvious neuro-muscular anomalies detected.  Muscle Tone/Strength: Functionally intact. No obvious neuro-muscular anomalies detected.  Sensory (Neurological): Unimpaired  Sensory (Neurological): Unimpaired  Palpation: No palpable anomalies  Palpation: No palpable anomalies   Assessment & Plan  Primary Diagnosis & Pertinent Problem List: The primary encounter diagnosis was Chronic shoulder pain (Primary Area of Pain) (Left). Diagnoses of Chronic upper extremity pain (Secondary Area  of Pain) (Bilateral), Chronic knee pain (Tertiary Area of Pain) (Left), and Chronic pain syndrome were also pertinent to this visit.  Visit Diagnosis: 1. Chronic shoulder pain (Primary Area of Pain) (Left)   2. Chronic upper extremity pain (Secondary Area of Pain) (Bilateral)   3. Chronic knee pain (Tertiary Area of Pain) (Left)   4. Chronic pain syndrome    Problems updated and reviewed during this visit: No problems updated.  Plan of Care  Pharmacotherapy (Medications Ordered): No orders of the defined types were placed in this encounter.  Procedure Orders    No procedure(s) ordered today   Lab Orders  No laboratory test(s) ordered today   Imaging Orders  No imaging studies ordered today   Referral Orders  No referral(s) requested today    Pharmacological management options:  Opioid Analgesics: We'll take over management today. See above orders Membrane stabilizer: We have discussed the possibility of optimizing this mode of therapy, if tolerated Muscle relaxant: We have discussed the possibility of a trial NSAID: We have discussed the possibility of a trial Other analgesic(s): To be determined at a later time   Interventional management  options: Planned, scheduled, and/or pending:    ***   Considering:   Diagnostic Left intra-articular shoulder injection  Diagnostic Left suprascapular nerve block  Possible left suprascapular radiofrequency ablation  Diagnostic left intra-articular knee injection  Diagnostic left genicular nerve block  Possible left knee radiofrequency ablation    PRN Procedures:   None at this time   Provider-requested follow-up: No follow-ups on file.  Future Appointments  Date Time Provider [place]  10/17/2017  8:30 AM Milinda Pointer, [place] None    Primary Care Physician: Tawny Asal Location: [facility] Note by: Gaspar Cola, MD Date: 10/17/2017; Time: 4:19 PM

## 2017-10-17 ENCOUNTER — Ambulatory Visit: Payer: Self-pay | Admitting: Pain Medicine

## 2017-11-15 ENCOUNTER — Ambulatory Visit (INDEPENDENT_AMBULATORY_CARE_PROVIDER_SITE_OTHER): Payer: Self-pay | Admitting: Nurse Practitioner

## 2017-12-16 ENCOUNTER — Ambulatory Visit (INDEPENDENT_AMBULATORY_CARE_PROVIDER_SITE_OTHER): Payer: Self-pay | Admitting: Physician Assistant

## 2018-01-13 ENCOUNTER — Other Ambulatory Visit (INDEPENDENT_AMBULATORY_CARE_PROVIDER_SITE_OTHER): Payer: Self-pay | Admitting: Physician Assistant

## 2018-01-13 DIAGNOSIS — E1141 Type 2 diabetes mellitus with diabetic mononeuropathy: Secondary | ICD-10-CM

## 2018-01-16 NOTE — Telephone Encounter (Signed)
FWD to PCP. Tempestt S Roberts, CMA  

## 2018-02-20 ENCOUNTER — Other Ambulatory Visit (INDEPENDENT_AMBULATORY_CARE_PROVIDER_SITE_OTHER): Payer: Self-pay | Admitting: Physician Assistant

## 2018-02-20 DIAGNOSIS — I10 Essential (primary) hypertension: Secondary | ICD-10-CM

## 2018-02-21 NOTE — Telephone Encounter (Signed)
FWD to PCP. Florentina Marquart S Syria Kestner, CMA  

## 2018-03-28 ENCOUNTER — Ambulatory Visit (INDEPENDENT_AMBULATORY_CARE_PROVIDER_SITE_OTHER): Payer: Self-pay | Admitting: Physician Assistant

## 2018-04-03 ENCOUNTER — Other Ambulatory Visit (INDEPENDENT_AMBULATORY_CARE_PROVIDER_SITE_OTHER): Payer: Self-pay | Admitting: Physician Assistant

## 2018-04-03 DIAGNOSIS — I10 Essential (primary) hypertension: Secondary | ICD-10-CM

## 2018-04-04 NOTE — Telephone Encounter (Signed)
FWD to PCP. Matthew Colon S [name], CMA  

## 2018-04-28 ENCOUNTER — Other Ambulatory Visit (INDEPENDENT_AMBULATORY_CARE_PROVIDER_SITE_OTHER): Payer: Self-pay | Admitting: Physician Assistant

## 2018-04-28 DIAGNOSIS — E1141 Type 2 diabetes mellitus with diabetic mononeuropathy: Secondary | ICD-10-CM

## 2018-05-01 NOTE — Telephone Encounter (Signed)
3 month supply request

## 2018-05-06 ENCOUNTER — Other Ambulatory Visit: Payer: Self-pay

## 2018-05-06 ENCOUNTER — Emergency Department (HOSPITAL_COMMUNITY): Payer: Self-pay

## 2018-05-06 ENCOUNTER — Encounter (HOSPITAL_COMMUNITY): Payer: Self-pay

## 2018-05-06 ENCOUNTER — Emergency Department (HOSPITAL_COMMUNITY)
Admission: EM | Admit: 2018-05-06 | Discharge: 2018-05-06 | Disposition: A | Discharge status: Home / Self Care | Payer: Self-pay | Attending: Emergency Medicine | Admitting: Emergency Medicine

## 2018-05-06 DIAGNOSIS — I10 Essential (primary) hypertension: Secondary | ICD-10-CM

## 2018-05-06 DIAGNOSIS — Z760 Encounter for issue of repeat prescription: Secondary | ICD-10-CM

## 2018-05-06 DIAGNOSIS — Z7982 Long term (current) use of aspirin: Secondary | ICD-10-CM | POA: Insufficient documentation

## 2018-05-06 DIAGNOSIS — R0981 Nasal congestion: Secondary | ICD-10-CM

## 2018-05-06 DIAGNOSIS — R5381 Other malaise: Secondary | ICD-10-CM | POA: Insufficient documentation

## 2018-05-06 DIAGNOSIS — Z79899 Other long term (current) drug therapy: Secondary | ICD-10-CM | POA: Insufficient documentation

## 2018-05-06 DIAGNOSIS — E119 Type 2 diabetes mellitus without complications: Secondary | ICD-10-CM | POA: Insufficient documentation

## 2018-05-06 DIAGNOSIS — E1141 Type 2 diabetes mellitus with diabetic mononeuropathy: Secondary | ICD-10-CM

## 2018-05-06 LAB — CBC WITH DIFFERENTIAL/PLATELET
ABS IMMATURE GRANULOCYTES: 0.03 10*3/uL (ref 0.00–0.07)
BASOS PCT: 1 %
Basophils Absolute: 0.1 10*3/uL (ref 0.0–0.1)
EOS ABS: 0.2 10*3/uL (ref 0.0–0.5)
Eosinophils Relative: 2 %
HCT: 35.2 % — ABNORMAL LOW (ref 39.0–52.0)
Hemoglobin: 11.5 g/dL — ABNORMAL LOW (ref 13.0–17.0)
Immature Granulocytes: 0 %
Lymphocytes Relative: 26 %
Lymphs Abs: 2.8 10*3/uL (ref 0.7–4.0)
MCH: 28.1 pg (ref 26.0–34.0)
MCHC: 32.7 g/dL (ref 30.0–36.0)
MCV: 86.1 fL (ref 80.0–100.0)
MONO ABS: 0.8 10*3/uL (ref 0.1–1.0)
MONOS PCT: 7 %
NEUTROS ABS: 7 10*3/uL (ref 1.7–7.7)
Neutrophils Relative %: 64 %
PLATELETS: 348 10*3/uL (ref 150–400)
RBC: 4.09 MIL/uL — AB (ref 4.22–5.81)
RDW: 14.1 % (ref 11.5–15.5)
WBC: 10.9 10*3/uL — AB (ref 4.0–10.5)
nRBC: 0 % (ref 0.0–0.2)

## 2018-05-06 LAB — COMPREHENSIVE METABOLIC PANEL
ALBUMIN: 3.6 g/dL (ref 3.5–5.0)
ALK PHOS: 55 U/L (ref 38–126)
ALT: 8 U/L (ref 0–44)
AST: 17 U/L (ref 15–41)
Anion gap: 8 (ref 5–15)
BILIRUBIN TOTAL: 0.4 mg/dL (ref 0.3–1.2)
BUN: 22 mg/dL (ref 8–23)
CALCIUM: 9.3 mg/dL (ref 8.9–10.3)
CO2: 24 mmol/L (ref 22–32)
Chloride: 108 mmol/L (ref 98–111)
Creatinine, Ser: 1.24 mg/dL (ref 0.61–1.24)
GFR calc Af Amer: >60 mL/min (ref >60)
GLUCOSE: 102 mg/dL — AB (ref 70–99)
POTASSIUM: 3.7 mmol/L (ref 3.5–5.1)
SODIUM: 140 mmol/L (ref 135–145)
TOTAL PROTEIN: 6.5 g/dL (ref 6.5–8.1)

## 2018-05-06 LAB — URINALYSIS, ROUTINE W REFLEX MICROSCOPIC
BILIRUBIN URINE: NEGATIVE
GLUCOSE, UA: NEGATIVE mg/dL
HGB URINE DIPSTICK: NEGATIVE
KETONES UR: NEGATIVE mg/dL
Leukocytes, UA: NEGATIVE
Nitrite: NEGATIVE
Protein, ur: NEGATIVE mg/dL
Specific Gravity, Urine: 1.019 (ref 1.005–1.030)
pH: 5 (ref 5.0–8.0)

## 2018-05-06 LAB — CBG MONITORING, ED: Glucose-Capillary: 91 mg/dL (ref 70–99)

## 2018-05-06 MED ORDER — METFORMIN HCL 500 MG PO TABS: 500.0000 mg | ORAL_TABLET | Freq: Two times a day (BID) | ORAL | 0 refills | Status: DC

## 2018-05-06 MED ORDER — GABAPENTIN 400 MG PO CAPS: 400.0000 mg | ORAL_CAPSULE | Freq: Three times a day (TID) | ORAL | 0 refills | Status: DC

## 2018-05-06 MED ORDER — LISINOPRIL 20 MG PO TABS: 20.0000 mg | ORAL_TABLET | Freq: Every day | ORAL | 0 refills | Status: DC

## 2018-05-06 NOTE — ED Notes (Signed)
Pt stable, ambulatory, and verbalizes understanding of d/c instructions.  Education provided to patient in regards to medication changes.  Pt verbalized understanding and had no further questions.

## 2018-05-06 NOTE — Discharge Instructions (Addendum)
You can take sudafed PE, available over the counter or mucinex for congestion.  Get rechecked immediately if you have any new or concerning symptoms.

## 2018-05-06 NOTE — ED Triage Notes (Signed)
Pt from home; states PCP wouldn't refill medications (gabapentin, lisinopril, metformin) until he goes in for evaluation; has been without meds for 4 days, appoint scheduled for 9th; pt states he feels feverish, has pressure in sinuses

## 2018-05-06 NOTE — ED Provider Notes (Signed)
[facility] EMERGENCY DEPARTMENT Provider Note   CSN: [id number] Arrival date & time: 05/06/18  0730     History   Chief Complaint No chief complaint on file.   HPI Matthew Simmer Sr. is a 62 y.o. male.  The history is provided by the patient. No language interpreter was used.   Matthew Mcmahill Sr. is a 62 y.o. male who presents to the Emergency Department complaining of malaise, medication refill. He presents to the emergency department feeling poorly for the last 4 to 5 days. He ran out of his blood pressure and diabetes medications five days ago. He contacted his doctor for a refill but was unable to be seen until November 19. He reports feeling generalized malaise with nasal congestion, cough, excessive thirst and excessive urination. He denies any chest pain, shortness of breath, nausea, vomiting, diarrhea, abdominal pain, fevers. He does feel warm at times. Symptoms are moderate, constant, worsening. Past Medical History:  Diagnosis Date  . Allergy   . Foot pain, bilateral    referred to podiatry 06/2014  . GERD (gastroesophageal reflux disease)    long history of  . Hyperlipidemia   . Hypertension   . Polyarthralgia    shoulders, knees, ankles, wrists; neg rheum lab screen 04/2014  . Type 2 diabetes mellitus with diabetic neuropathy ([place])   . Wears glasses     Patient Active Problem List   Diagnosis Date Noted  . DDD (degenerative disc disease), cervical 09/21/2017  . DJD of acromioclavicular joint (Right) 09/21/2017  . DJD of acromioclavicular joint (Bilateral) 09/21/2017  . DJD of acromioclavicular joint (Left) 09/21/2017  . Osteoarthritis of glenohumeral joint (Left) 09/21/2017  . 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/2017  . Tricompartmental disease of knee (Bilateral) 09/21/2017  . Osteoarthritis of knees (Bilateral) 09/21/2017  . Suprapatellar effusion of knee (recurrent) (Right) 09/21/2017  . Calcaneal spur of feet (Bilateral) 09/21/2017    . Hypomagnesemia 09/20/2017  . Low testosterone 09/20/2017  . Chronic foot pain (Bilateral) 09/20/2017  . Vitamin D deficiency 04/04/2017  . Chronic shoulder pain (Primary Area of Pain) (Left) 03/30/2017  . Chronic knee pain [facility] Area of Pain) (Left) 03/30/2017  . Disorder of bone, unspecified 03/30/2017  . Other reduced mobility 03/30/2017  . Other specified health status 03/30/2017  . Long term current use of opiate analgesic 03/30/2017  . Opiate use 03/30/2017  . Chronic pain syndrome 03/30/2017  . Chronic upper extremity pain (Secondary Area of Pain) (Bilateral) 03/30/2017  . Elevated PSA 12/05/2015  . Noncompliance 12/05/2015  . Hypertensive disease 03/14/2015  . Type 2 diabetes mellitus with diabetic mononeuropathy ([place]) 03/14/2015  . Encounter for health maintenance examination in adult 03/14/2015  . Screening for prostate cancer 03/14/2015  . Special screening for malignant neoplasms, colon 03/14/2015  . Hyperlipidemia 03/14/2015  . Nocturia 03/14/2015  . Urinary frequency 03/14/2015  . Need for prophylactic vaccination and inoculation against influenza 03/14/2015  . Need for prophylactic vaccination against Streptococcus pneumoniae (pneumococcus) 03/14/2015  . Need for Tdap vaccination 03/14/2015  . Chronic nausea 03/14/2015  . Gastroesophageal reflux disease without esophagitis 03/14/2015    Past Surgical History:  Procedure Laterality Date  . COLONOSCOPY     referral pending 03/2015  . NO PAST SURGERIES     as of 03/2015        Home Medications    Prior to Admission medications   Medication Sig Start Date End Date Taking? Authorizing Provider  amoxicillin-clavulanate (AUGMENTIN) 875-125 MG tablet Take 1 tablet by mouth  2 (two) times daily. 04/15/17   Clent Demark, PA-C  aspirin EC 81 MG tablet Take 1 tablet (81 mg total) by mouth daily. 12/05/15   Tysinger, Camelia Eng, PA-C  cetirizine (ZYRTEC) 10 MG tablet Take 1 tablet (10 mg total) by mouth daily.  07/12/16   Etta Quill, NP  docusate sodium (COLACE) 100 MG capsule Take 200 mg by mouth daily as needed for mild constipation.    [provider]  gabapentin (NEURONTIN) 400 MG capsule Take 1 capsule (400 mg total) by mouth 3 (three) times daily for 20 days. 05/06/18 05/26/18  Quintella Reichert, MD  hydrochlorothiazide (HYDRODIURIL) 12.5 MG tablet TAKE 1 TABLET BY MOUTH ONCE DAILY 04/04/18   Clent Demark, PA-C  HYDROcodone-acetaminophen [facility]) 5-325 MG tablet Take 1 tablet daily as needed by mouth. Patient not taking: Reported on 08/24/2017 05/16/17   Leandrew Koyanagi, MD  lisinopril (PRINIVIL,ZESTRIL) 20 MG tablet Take 1 tablet (20 mg total) by mouth daily. 05/06/18   Quintella Reichert, MD  metFORMIN (GLUCOPHAGE) 500 MG tablet Take 1 tablet (500 mg total) by mouth 2 (two) times daily with a meal for 20 days. 05/06/18 05/26/18  Quintella Reichert, MD  tadalafil (CIALIS) 20 MG tablet Take 1 tablet (20 mg total) by mouth daily as needed for erectile dysfunction. 12/05/15   Tysinger, Camelia Eng, PA-C  traMADol (ULTRAM) 50 MG tablet Take 1-2 tablets (50-100 mg total) 3 (three) times daily as needed by mouth. 05/16/17   Leandrew Koyanagi, MD    Family History Family History  Problem Relation Age of Onset  . Diabetes Mother   . Hypertension Mother   . Diabetes Sister   . Arthritis Sister   . Heart disease Sister   . Other Father        murdered  . Diabetes Sister   . Diabetes Sister   . Cancer Neg Hx   . Stroke Neg Hx     Social History Social History   Tobacco Use  . Smoking status: Never Smoker  . Smokeless tobacco: Never Used  Substance Use Topics  . Alcohol use: Yes    Comment: occasional beer  . Drug use: No     Allergies   Patient has no known allergies.   Review of Systems Review of Systems  All other systems reviewed and are negative.    Physical Exam Updated Vital Signs BP 132/79 (BP Location: Right Arm)   Pulse 80   Temp 97.9 F (36.6 C) (Oral)   Resp 16   Ht 5'  11" (1.803 m)   Wt 136.1 kg   SpO2 98%   BMI 41.84 kg/m   Physical Exam  Constitutional: He is oriented to person, place, and time. He appears well-developed and well-nourished.  HENT:  Head: Normocephalic and atraumatic.  Mouth/Throat: Oropharynx is clear and moist.  Boggy nasal mucousa  Cardiovascular: Normal rate and regular rhythm.  No murmur heard. Pulmonary/Chest: Effort normal and breath sounds normal. No respiratory distress.  Abdominal: Soft. There is no tenderness. There is no rebound and no guarding.  Musculoskeletal: He exhibits no tenderness.  Nonpitting edema to bilateral lower extremities  Neurological: He is alert and oriented to person, place, and time.  5/5 strength in all four extremities, normal gait.   Skin: Skin is warm and dry.  Psychiatric: He has a normal mood and affect. His behavior is normal.  Nursing note and vitals reviewed.    ED Treatments / Results  Labs (all labs ordered are  listed, but only abnormal results are displayed) Labs Reviewed  COMPREHENSIVE METABOLIC PANEL - Abnormal; Notable for the following components:      Result Value   Glucose, Bld 102 (*)    All other components within normal limits  CBC WITH DIFFERENTIAL/PLATELET - Abnormal; Notable for the following components:   WBC 10.9 (*)    RBC 4.09 (*)    Hemoglobin 11.5 (*)    HCT 35.2 (*)    All other components within normal limits  URINALYSIS, ROUTINE W REFLEX MICROSCOPIC  CBG MONITORING, ED    EKG EKG Interpretation  Date/Time:  Saturday May 06 2018 07:54:54 EDT Ventricular Rate:  79 PR Interval:    QRS Duration: 79 QT Interval:  343 QTC Calculation: 394 R Axis:   11 Text Interpretation:  Sinus rhythm Low voltage, precordial leads Probable anteroseptal infarct, old Confirmed by Quintella Reichert [phone]) on 05/06/2018 7:58:34 AM   Radiology Dg Chest 2 View  Result Date: 05/06/2018 CLINICAL DATA:  Elevated blood pressure. Off hypertension medication EXAM:  CHEST - 2 VIEW COMPARISON:  None. FINDINGS: Normal mediastinum and cardiac silhouette. Normal pulmonary vasculature. No evidence of effusion, infiltrate, or pneumothorax. No acute bony abnormality. IMPRESSION: No acute cardiopulmonary process. Electronically Signed   By: Suzy Bouchard M.D.   On: 05/06/2018 08:56    Procedures Procedures (including critical care time)  Medications Ordered in ED Medications - No data to display   Initial Impression / Assessment and Plan / ED Course  I have reviewed the triage vital signs and the nursing notes.  Pertinent labs & imaging results that were available during my care of the patient were reviewed by me and considered in my medical decision making (see chart for details).     Patient here for evaluation of malaise, polyuria, polydipsia, nasal congestion. He is been off of his medications for the last five days. He is non-toxic appearing on examination with no respiratory distress. Presentation is not consistent with hypertensive urgency, DKA, serious bacterial infection, CVA. Discussed with patient home care for sinusitis. Also discussed medication refill. Given his minimally elevated blood pressure, normal blood sugar at this time although he is fasting since last night will re-prescribe his medications but at a lower dose. Discussed importance of continuing to eat when taking metformin. Discussed importance of outpatient follow-up and return precautions.  Final Clinical Impressions(s) / ED Diagnoses   Final diagnoses:  Nasal congestion  Medication refill    ED Discharge Orders         Ordered    gabapentin (NEURONTIN) 400 MG capsule  3 times daily    Note to Pharmacy:  Please consider 90 day supplies to promote better adherence   05/06/18 1001    lisinopril (PRINIVIL,ZESTRIL) 20 MG tablet  Daily     05/06/18 1001    metFORMIN (GLUCOPHAGE) 500 MG tablet  2 times daily with meals    Note to Pharmacy:  [place] previous prescription for  Metformin 500 mg please.   05/06/18 1001           Quintella Reichert, MD 05/06/18 1011

## 2018-05-23 ENCOUNTER — Encounter (INDEPENDENT_AMBULATORY_CARE_PROVIDER_SITE_OTHER): Payer: Self-pay | Admitting: Physician Assistant

## 2018-05-23 ENCOUNTER — Ambulatory Visit (INDEPENDENT_AMBULATORY_CARE_PROVIDER_SITE_OTHER): Payer: Self-pay | Admitting: Physician Assistant

## 2018-05-23 ENCOUNTER — Other Ambulatory Visit: Payer: Self-pay

## 2018-05-23 VITALS — BP 142/74 | HR 87 | Temp 97.7°F | Ht 71.0 in | Wt 304.6 lb

## 2018-05-23 DIAGNOSIS — R5383 Other fatigue: Secondary | ICD-10-CM

## 2018-05-23 DIAGNOSIS — R202 Paresthesia of skin: Secondary | ICD-10-CM

## 2018-05-23 DIAGNOSIS — I10 Essential (primary) hypertension: Secondary | ICD-10-CM

## 2018-05-23 DIAGNOSIS — D649 Anemia, unspecified: Secondary | ICD-10-CM

## 2018-05-23 DIAGNOSIS — Z9119 Patient's noncompliance with other medical treatment and regimen: Secondary | ICD-10-CM

## 2018-05-23 DIAGNOSIS — J320 Chronic maxillary sinusitis: Secondary | ICD-10-CM

## 2018-05-23 DIAGNOSIS — Z91199 Patient's noncompliance with other medical treatment and regimen due to unspecified reason: Secondary | ICD-10-CM

## 2018-05-23 DIAGNOSIS — Z1211 Encounter for screening for malignant neoplasm of colon: Secondary | ICD-10-CM

## 2018-05-23 DIAGNOSIS — E1141 Type 2 diabetes mellitus with diabetic mononeuropathy: Secondary | ICD-10-CM

## 2018-05-23 LAB — POCT GLYCOSYLATED HEMOGLOBIN (HGB A1C): HEMOGLOBIN A1C: 5.8 % — AB (ref 4.0–5.6)

## 2018-05-23 MED ORDER — METFORMIN HCL ER 500 MG PO TB24: 500.0000 mg | ORAL_TABLET | Freq: Every day | ORAL | 3 refills | Status: DC

## 2018-05-23 MED ORDER — DOXYCYCLINE HYCLATE 100 MG PO TABS: 100.0000 mg | ORAL_TABLET | Freq: Two times a day (BID) | ORAL | 0 refills | Status: DC

## 2018-05-23 MED ORDER — GABAPENTIN 600 MG PO TABS: 600.0000 mg | ORAL_TABLET | Freq: Three times a day (TID) | ORAL | 3 refills | Status: DC

## 2018-05-23 MED ORDER — LISINOPRIL 40 MG PO TABS: 40.0000 mg | ORAL_TABLET | Freq: Every day | ORAL | 3 refills | Status: DC

## 2018-05-23 MED ORDER — ASPIRIN EC 81 MG PO TBEC: 81.0000 mg | DELAYED_RELEASE_TABLET | Freq: Every day | ORAL | 3 refills | Status: DC

## 2018-05-23 MED ORDER — DULOXETINE HCL 30 MG PO CPEP: 30.0000 mg | ORAL_CAPSULE | Freq: Every day | ORAL | 3 refills | Status: DC

## 2018-05-23 MED ORDER — GUAIFENESIN ER 1200 MG PO TB12: 1.0000 | ORAL_TABLET | Freq: Two times a day (BID) | ORAL | 0 refills | Status: AC

## 2018-05-23 MED ORDER — NAPROXEN 500 MG PO TABS: 500.0000 mg | ORAL_TABLET | Freq: Two times a day (BID) | ORAL | 0 refills | Status: DC

## 2018-05-23 MED ORDER — HYDROCHLOROTHIAZIDE 12.5 MG PO TABS: 12.5000 mg | ORAL_TABLET | Freq: Every day | ORAL | 3 refills | Status: DC

## 2018-05-23 NOTE — Progress Notes (Signed)
Pt complains of sinus congestion and nothing he takes is helping Pt complains of shoulder pain  Pt complains that he has pain jumping all over his body

## 2018-05-23 NOTE — Patient Instructions (Signed)
Anemia Anemia is a condition in which you do not have enough red blood cells or hemoglobin. Hemoglobin is a substance in red blood cells that carries oxygen. When you do not have enough red blood cells or hemoglobin (are anemic), your body cannot get enough oxygen and your organs may not work properly. As a result, you may feel very tired or have other problems. What are the causes? Common causes of anemia include:  Excessive bleeding. Anemia can be caused by excessive bleeding inside or outside the body, including bleeding from the intestine or from periods in women.  Poor nutrition.  Long-lasting (chronic) kidney, thyroid, and liver disease.  Bone marrow disorders.  Cancer and treatments for cancer.  HIV (human immunodeficiency virus) and AIDS (acquired immunodeficiency syndrome).  Treatments for HIV and AIDS.  Spleen problems.  Blood disorders.  Infections, medicines, and autoimmune disorders that destroy red blood cells.  What are the signs or symptoms? Symptoms of this condition include:  Minor weakness.  Dizziness.  Headache.  Feeling heartbeats that are irregular or faster than normal (palpitations).  Shortness of breath, especially with exercise.  Paleness.  Cold sensitivity.  Indigestion.  Nausea.  Difficulty sleeping.  Difficulty concentrating.  Symptoms may occur suddenly or develop slowly. If your anemia is mild, you may not have symptoms. How is this diagnosed? This condition is diagnosed based on:  Blood tests.  Your medical history.  A physical exam.  Bone marrow biopsy.  Your health care provider may also check your stool (feces) for blood and may do additional testing to look for the cause of your bleeding. You may also have other tests, including:  Imaging tests, such as a CT scan or MRI.  Endoscopy.  Colonoscopy.  How is this treated? Treatment for this condition depends on the cause. If you continue to lose a lot of blood,  you may need to be treated at a hospital. Treatment may include:  Taking supplements of iron, vitamin B12, or folic acid.  Taking a hormone medicine (erythropoietin) that can help to stimulate red blood cell growth.  Having a blood transfusion. This may be needed if you lose a lot of blood.  Making changes to your diet.  Having surgery to remove your spleen.  Follow these instructions at home:  Take over-the-counter and prescription medicines only as told by your health care provider.  Take supplements only as told by your health care provider.  Follow any diet instructions that you were given.  Keep all follow-up visits as told by your health care provider. This is important. Contact a health care provider if:  You develop new bleeding anywhere in the body. Get help right away if:  You are very weak.  You are short of breath.  You have pain in your abdomen or chest.  You are dizzy or feel faint.  You have trouble concentrating.  You have bloody or black, tarry stools.  You vomit repeatedly or you vomit up blood. Summary  Anemia is a condition in which you do not have enough red blood cells or enough of a substance in your red blood cells that carries oxygen (hemoglobin).  Symptoms may occur suddenly or develop slowly.  If your anemia is mild, you may not have symptoms.  This condition is diagnosed with blood tests as well as a medical history and physical exam. Other tests may be needed.  Treatment for this condition depends on the cause of the anemia. This information is not intended to replace advice   given to you by your health care provider. Make sure you discuss any questions you have with your health care provider. Document Released: 07/29/2004 Document Revised: 07/23/2016 Document Reviewed: 07/23/2016 Elsevier Interactive Patient Education  [employer].

## 2018-05-23 NOTE — Progress Notes (Signed)
Subjective:  Patient ID: Matthew Marus Sr., male    DOB: 26-Jun-1956  Age: 62 y.o. MRN: [id number]  CC: DM f/u   HPI Matthew Colon a 62 y.o.malewith a medical history of HTN, DM2, HLD, CKD, GERD, bilateral primary OA of the knee, and diabetic neuropathy presents to f/u on DM. Last seen here more than 13 months ago. Last A1c 6.2% while taking metformin alone. Advised to return in six months for DM f/u but failed to do so. Reportedly had Metformin and Lisinopril reduced to half dose at ED. Pt does not know why doses were reduced. A1c 5.8% today. Last serum creatinine normal.     Main complaint today is of tingling and sharp pains "all over" body. Felt in the upper extremities, lower extremities, and lower abdomen. Says he has taken up to four of his 400 mg Gabapentin pills to relieve his pain. Took increased dose of Gabapentin without direction from a medical professional.    Also complains of sinusitis. Received treatment at outside clinic to include abx. Feels pressure in the maxillary sinus bilaterally and has some nasal congestion. No fever, chills, nausea, vomiting, eye pain, ear pain, rash, swelling, or headache. Thinks he may need more medicine to rid his sinusitis.      Outpatient Medications Prior to Visit  Medication Sig Dispense Refill  . aspirin EC 81 MG tablet Take 1 tablet (81 mg total) by mouth daily. 90 tablet 3  . docusate sodium (COLACE) 100 MG capsule Take 200 mg by mouth daily as needed for mild constipation.    . gabapentin (NEURONTIN) 400 MG capsule Take 1 capsule (400 mg total) by mouth 3 (three) times daily for 20 days. 60 capsule 0  . hydrochlorothiazide (HYDRODIURIL) 12.5 MG tablet TAKE 1 TABLET BY MOUTH ONCE DAILY 30 tablet 0  . lisinopril (PRINIVIL,ZESTRIL) 20 MG tablet Take 1 tablet (20 mg total) by mouth daily. 20 tablet 0  . metFORMIN (GLUCOPHAGE) 500 MG tablet Take 1 tablet (500 mg total) by mouth 2 (two) times daily with a meal for 20 days. 40 tablet 0   . amoxicillin-clavulanate (AUGMENTIN) 875-125 MG tablet Take 1 tablet by mouth 2 (two) times daily. 20 tablet 0  . cetirizine (ZYRTEC) 10 MG tablet Take 1 tablet (10 mg total) by mouth daily. 30 tablet 0  . HYDROcodone-acetaminophen (NORCO) 5-325 MG tablet Take 1 tablet daily as needed by mouth. (Patient not taking: Reported on 08/24/2017) 30 tablet 0  . tadalafil (CIALIS) 20 MG tablet Take 1 tablet (20 mg total) by mouth daily as needed for erectile dysfunction. 10 tablet 2  . traMADol (ULTRAM) 50 MG tablet Take 1-2 tablets (50-100 mg total) 3 (three) times daily as needed by mouth. 30 tablet 2   No facility-administered medications prior to visit.      ROS Review of Systems  Constitutional: Positive for malaise/fatigue. Negative for chills and fever.  HENT: Positive for sinus pain.   Eyes: Negative for blurred vision.  Respiratory: Negative for shortness of breath.   Cardiovascular: Negative for chest pain and palpitations.  Gastrointestinal: Negative for abdominal pain and nausea.  Genitourinary: Negative for dysuria and hematuria.  Musculoskeletal: Positive for joint pain. Negative for myalgias.  Skin: Negative for rash.  Neurological: Positive for tingling. Negative for headaches.  Psychiatric/Behavioral: Negative for depression. The patient is not nervous/anxious.     Objective:  Ht 5\' 11"  (1.803 m)   Wt (!) 304 lb 9.6 oz (138.2 kg)   BMI 42.48 kg/m  Vitals:   05/23/18 0837  BP: (!) 142/74  Pulse: 87  Temp: 97.7 F (36.5 C)  SpO2: 93%     Physical Exam  Constitutional: He is oriented to person, place, and time.  Well developed, obese, NAD, polite  HENT:  Head: Normocephalic and atraumatic.  Maxillary sinus tenderness to palpation bilaterally. Turbinates mildly hypertrophic but not erythematous or pale.  Eyes: No scleral icterus.  Neck: Normal range of motion. Neck supple. No thyromegaly present.  Cardiovascular: Normal rate, regular rhythm and normal heart  sounds.  Pulmonary/Chest: Effort normal and breath sounds normal.  Musculoskeletal: He exhibits no edema.  Neurological: He is alert and oriented to person, place, and time.  Strength 5/5 throughout. Normal gait.  Skin: Skin is warm and dry. No rash noted. No erythema. No pallor.  Psychiatric: He has a normal mood and affect. His behavior is normal. Thought content normal.  Vitals reviewed.    Assessment & Plan:    1. Type 2 diabetes mellitus with diabetic mononeuropathy, without long-term current use of insulin (HCC) - HgB A1c 5.8% today. - Begin metFORMIN (GLUCOPHAGE XR) 500 MG 24 hr tablet; Take 1 tablet (500 mg total) by mouth daily with breakfast.  Dispense: 30 tablet; Refill: 3  2. Anemia, unspecified type - Iron, TIBC and Ferritin Panel  3. Other fatigue - TSH  4. Chronic maxillary sinusitis - doxycycline (VIBRA-TABS) 100 MG tablet; Take 1 tablet (100 mg total) by mouth 2 (two) times daily.  Dispense: 20 tablet; Refill: 0 - Guaifenesin (MUCINEX MAXIMUM STRENGTH) 1200 MG TB12; Take 1 tablet (1,200 mg total) by mouth 2 (two) times daily for 5 days.  Dispense: 10 tablet; Refill: 0 - naproxen (NAPROSYN) 500 MG tablet; Take 1 tablet (500 mg total) by mouth 2 (two) times daily with a meal.  Dispense: 14 tablet; Refill: 0  5. Paresthesia - gabapentin (NEURONTIN) 600 MG tablet; Take 1 tablet (600 mg total) by mouth 3 (three) times daily.  Dispense: 90 tablet; Refill: 3 - DULoxetine (CYMBALTA) 30 MG capsule; Take 1 capsule (30 mg total) by mouth daily.  Dispense: 30 capsule; Refill: 3  6. Essential hypertension - Lipid panel - hydrochlorothiazide (HYDRODIURIL) 12.5 MG tablet; Take 1 tablet (12.5 mg total) by mouth daily.  Dispense: 30 tablet; Refill: 3 - lisinopril (PRINIVIL,ZESTRIL) 40 MG tablet; Take 1 tablet (40 mg total) by mouth daily.  Dispense: 30 tablet; Refill: 3 - aspirin EC 81 MG tablet; Take 1 tablet (81 mg total) by mouth daily.  Dispense: 90 tablet; Refill: 3  7.  Medically noncompliant - Pt advised to return to f/u appts as indicated and to not take medications other than how they were prescribed.   8. Special screening for malignant neoplasms, colon - Fecal occult blood, imunochemical   Meds ordered this encounter  Medications  . gabapentin (NEURONTIN) 600 MG tablet    Sig: Take 1 tablet (600 mg total) by mouth 3 (three) times daily.    Dispense:  90 tablet    Refill:  3    Order Specific Question:   Supervising Provider    Answer:   Charlott Rakes [[id number]]  . DULoxetine (CYMBALTA) 30 MG capsule    Sig: Take 1 capsule (30 mg total) by mouth daily.    Dispense:  30 capsule    Refill:  3    Order Specific Question:   Supervising Provider    Answer:   Charlott Rakes [[id number]]  . hydrochlorothiazide (HYDRODIURIL) 12.5 MG tablet    Sig: Take  1 tablet (12.5 mg total) by mouth daily.    Dispense:  30 tablet    Refill:  3    Please consider 90 day supplies to promote better adherence    Order Specific Question:   Supervising Provider    Answer:   Charlott Rakes [[id number]]  . metFORMIN (GLUCOPHAGE XR) 500 MG 24 hr tablet    Sig: Take 1 tablet (500 mg total) by mouth daily with breakfast.    Dispense:  30 tablet    Refill:  3    Order Specific Question:   Supervising Provider    Answer:   Charlott Rakes [[id number]]  . lisinopril (PRINIVIL,ZESTRIL) 40 MG tablet    Sig: Take 1 tablet (40 mg total) by mouth daily.    Dispense:  30 tablet    Refill:  3    Order Specific Question:   Supervising Provider    Answer:   Charlott Rakes [[id number]]  . aspirin EC 81 MG tablet    Sig: Take 1 tablet (81 mg total) by mouth daily.    Dispense:  90 tablet    Refill:  3    Order Specific Question:   Supervising Provider    Answer:   Charlott Rakes [[id number]]  . doxycycline (VIBRA-TABS) 100 MG tablet    Sig: Take 1 tablet (100 mg total) by mouth 2 (two) times daily.    Dispense:  20 tablet    Refill:  0    Order Specific Question:   Supervising Provider    Answer:    Charlott Rakes [[id number]]  . Guaifenesin (MUCINEX MAXIMUM STRENGTH) 1200 MG TB12    Sig: Take 1 tablet (1,200 mg total) by mouth 2 (two) times daily for 5 days.    Dispense:  10 tablet    Refill:  0    Order Specific Question:   Supervising Provider    Answer:   Charlott Rakes [[id number]]  . naproxen (NAPROSYN) 500 MG tablet    Sig: Take 1 tablet (500 mg total) by mouth 2 (two) times daily with a meal.    Dispense:  14 tablet    Refill:  0    Order Specific Question:   Supervising Provider    Answer:   Charlott Rakes [[id number]]    Follow-up: 8 weeks for paresthesias and HTN  Clent Demark PA

## 2018-05-24 ENCOUNTER — Other Ambulatory Visit (INDEPENDENT_AMBULATORY_CARE_PROVIDER_SITE_OTHER): Payer: Self-pay | Admitting: Physician Assistant

## 2018-05-24 DIAGNOSIS — E7841 Elevated Lipoprotein(a): Secondary | ICD-10-CM

## 2018-05-24 LAB — LIPID PANEL
CHOLESTEROL TOTAL: 231 mg/dL — AB (ref 100–199)
Chol/HDL Ratio: 3.1 ratio (ref 0.0–5.0)
HDL: 75 mg/dL (ref >39)
LDL Calculated: 146 mg/dL — ABNORMAL HIGH (ref 0–99)
Triglycerides: 50 mg/dL (ref 0–149)
VLDL CHOLESTEROL CAL: 10 mg/dL (ref 5–40)

## 2018-05-24 LAB — IRON,TIBC AND FERRITIN PANEL
FERRITIN: 53 ng/mL (ref 30–400)
IRON SATURATION: 12 % — AB (ref 15–55)
Iron: 44 ug/dL (ref 38–169)
Total Iron Binding Capacity: 382 ug/dL (ref 250–450)
UIBC: 338 ug/dL (ref 111–343)

## 2018-05-24 LAB — TSH: TSH: 2.75 u[IU]/mL (ref 0.450–4.500)

## 2018-05-24 MED ORDER — ATORVASTATIN CALCIUM 40 MG PO TABS: 40.0000 mg | ORAL_TABLET | Freq: Every day | ORAL | 3 refills | Status: DC

## 2018-05-25 ENCOUNTER — Telehealth (INDEPENDENT_AMBULATORY_CARE_PROVIDER_SITE_OTHER): Payer: Self-pay

## 2018-05-25 NOTE — Telephone Encounter (Signed)
-----   Message from Clent Demark, PA-C sent at 05/24/2018  1:58 PM EST ----- Cholesterol elevated. I sent atorvastatin to [facility] on [address].

## 2018-05-25 NOTE — Telephone Encounter (Signed)
Left voicemail notifying patient that cholesterol is elevated and atorvastatin has been sent to [facility] on [address]. Return call to [facility] with any questions or concerns. Nat Christen, CMA

## 2018-06-07 ENCOUNTER — Other Ambulatory Visit (INDEPENDENT_AMBULATORY_CARE_PROVIDER_SITE_OTHER): Payer: Self-pay | Admitting: Physician Assistant

## 2018-06-07 DIAGNOSIS — J320 Chronic maxillary sinusitis: Secondary | ICD-10-CM

## 2018-06-08 NOTE — Telephone Encounter (Signed)
FWD to PCP. Elene Downum S Ambre Kobayashi, CMA  

## 2018-06-14 ENCOUNTER — Telehealth (INDEPENDENT_AMBULATORY_CARE_PROVIDER_SITE_OTHER): Payer: Self-pay

## 2018-06-14 LAB — FECAL OCCULT BLOOD, IMMUNOCHEMICAL: Fecal Occult Bld: NEGATIVE

## 2018-06-14 NOTE — Telephone Encounter (Signed)
Left voicemail notifying patient that FIT is negative. Nat Christen, CMA

## 2018-06-14 NOTE — Telephone Encounter (Signed)
-----   Message from Clent Demark, PA-C sent at 06/14/2018 12:39 PM EST ----- FIT negative.

## 2018-07-17 ENCOUNTER — Other Ambulatory Visit: Payer: Self-pay | Admitting: Internal Medicine

## 2018-07-17 DIAGNOSIS — E1141 Type 2 diabetes mellitus with diabetic mononeuropathy: Secondary | ICD-10-CM

## 2018-07-17 MED ORDER — METFORMIN HCL ER 500 MG PO TB24: 500.0000 mg | ORAL_TABLET | Freq: Every day | ORAL | 3 refills | Status: DC

## 2018-07-18 ENCOUNTER — Ambulatory Visit (INDEPENDENT_AMBULATORY_CARE_PROVIDER_SITE_OTHER): Payer: Self-pay | Admitting: Family Medicine

## 2018-07-25 ENCOUNTER — Other Ambulatory Visit: Payer: Self-pay | Admitting: Family Medicine

## 2018-07-25 DIAGNOSIS — E1141 Type 2 diabetes mellitus with diabetic mononeuropathy: Secondary | ICD-10-CM

## 2018-07-25 MED ORDER — METFORMIN HCL ER 500 MG PO TB24: 500.0000 mg | ORAL_TABLET | Freq: Every day | ORAL | 3 refills | Status: DC

## 2018-07-25 NOTE — Progress Notes (Signed)
Patient ID: Matthew Marus Sr., male   DOB: 04-20-1956, 63 y.o.   MRN: [id number]     Refill request for patient's metformin ER with request dated 07/22/2018 received but it appears that this medication was refilled to [facility] by Dr. Karle Plumber on 07/17/2018.  Patient was last seen for diabetes on 05/23/2018.  We will send in additional prescription in case original prescription was not received.

## 2018-08-11 ENCOUNTER — Encounter (HOSPITAL_COMMUNITY): Payer: Self-pay

## 2018-08-11 ENCOUNTER — Ambulatory Visit (HOSPITAL_COMMUNITY)
Admission: EM | Admit: 2018-08-11 | Discharge: 2018-08-11 | Disposition: A | Discharge status: Home / Self Care | Payer: BLUE CROSS/BLUE SHIELD | Attending: Internal Medicine | Admitting: Internal Medicine

## 2018-08-11 DIAGNOSIS — J111 Influenza due to unidentified influenza virus with other respiratory manifestations: Secondary | ICD-10-CM

## 2018-08-11 DIAGNOSIS — R69 Illness, unspecified: Secondary | ICD-10-CM | POA: Diagnosis not present

## 2018-08-11 DIAGNOSIS — R0981 Nasal congestion: Secondary | ICD-10-CM

## 2018-08-11 MED ORDER — TRIAMCINOLONE ACETONIDE 55 MCG/ACT NA AERO: 2.0000 | INHALATION_SPRAY | Freq: Every day | NASAL | 0 refills | Status: DC

## 2018-08-11 MED ORDER — OSELTAMIVIR PHOSPHATE 75 MG PO CAPS: 75.0000 mg | ORAL_CAPSULE | Freq: Two times a day (BID) | ORAL | 0 refills | Status: DC

## 2018-08-11 MED ORDER — BENZONATATE 200 MG PO CAPS: 200.0000 mg | ORAL_CAPSULE | Freq: Three times a day (TID) | ORAL | 0 refills | Status: DC | PRN

## 2018-08-11 NOTE — Discharge Instructions (Signed)
Symptoms today seem most consistent with a flu like illness.  Prescriptions for oseltamivir (flu medicine), benzonatate (for cough) and a nasal steroid spray (for severe congestion) were sent to the pharmacy.  Anticipate gradual improvement in congestion, runny nose, well being, over the next several days.  Cough may take a couple weeks to subside.  Push fluids and rest.  Tylenol or ibuprofen otc as needed for fever, discomfort.  Recheck for persistent (>3 more days) fever >100.5, increasing phlegm production/nasal discharge, or if not starting to improve in a few days.

## 2018-08-11 NOTE — ED Provider Notes (Signed)
[facility]    CSN: [id number] Arrival date & time: 08/11/18  1548     History   Chief Complaint Chief Complaint  Patient presents with  . URI    HPI Matthew Geiman Sr. is a 63 y.o. male.   He presents today with onset of severe head congestion, runny/congested nose, watering eyes, fatigue, tactile temp, yesterday.  Little bit of cough.  Not really achy, not really having headache.  Had a flu shot.  Not vomiting, no diarrhea.    HPI  Past Medical History:  Diagnosis Date  . Allergy   . Foot pain, bilateral    referred to podiatry 06/2014  . GERD (gastroesophageal reflux disease)    long history of  . Hyperlipidemia   . Hypertension   . Polyarthralgia    shoulders, knees, ankles, wrists; neg rheum lab screen 04/2014  . Type 2 diabetes mellitus with diabetic neuropathy ([place])   . Wears glasses     Patient Active Problem List   Diagnosis Date Noted  . DDD (degenerative disc disease), cervical 09/21/2017  . DJD of acromioclavicular joint (Right) 09/21/2017  . DJD of acromioclavicular joint (Bilateral) 09/21/2017  . DJD of acromioclavicular joint (Left) 09/21/2017  . Osteoarthritis of glenohumeral joint (Left) 09/21/2017  . 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/2017  . Tricompartmental disease of knee (Bilateral) 09/21/2017  . Osteoarthritis of knees (Bilateral) 09/21/2017  . Suprapatellar effusion of knee (recurrent) (Right) 09/21/2017  . Calcaneal spur of feet (Bilateral) 09/21/2017  . Hypomagnesemia 09/20/2017  . Low testosterone 09/20/2017  . Chronic foot pain (Bilateral) 09/20/2017  . Vitamin D deficiency 04/04/2017  . Chronic shoulder pain (Primary Area of Pain) (Left) 03/30/2017  . Chronic knee pain [facility] Area of Pain) (Left) 03/30/2017  . Disorder of bone, unspecified 03/30/2017  . Other reduced mobility 03/30/2017  . Other specified health status 03/30/2017  . Long term current use of opiate analgesic 03/30/2017  . Opiate use 03/30/2017    . Chronic pain syndrome 03/30/2017  . Chronic upper extremity pain (Secondary Area of Pain) (Bilateral) 03/30/2017  . Elevated PSA 12/05/2015  . Noncompliance 12/05/2015  . Hypertensive disease 03/14/2015  . Type 2 diabetes mellitus with diabetic mononeuropathy ([place]) 03/14/2015  . Encounter for health maintenance examination in adult 03/14/2015  . Screening for prostate cancer 03/14/2015  . Special screening for malignant neoplasms, colon 03/14/2015  . Hyperlipidemia 03/14/2015  . Nocturia 03/14/2015  . Urinary frequency 03/14/2015  . Need for prophylactic vaccination and inoculation against influenza 03/14/2015  . Need for prophylactic vaccination against Streptococcus pneumoniae (pneumococcus) 03/14/2015  . Need for Tdap vaccination 03/14/2015  . Chronic nausea 03/14/2015  . Gastroesophageal reflux disease without esophagitis 03/14/2015    Past Surgical History:  Procedure Laterality Date  . COLONOSCOPY     referral pending 03/2015  . NO PAST SURGERIES     as of 03/2015       Home Medications    Prior to Admission medications   Medication Sig Start Date End Date Taking? Authorizing Provider  aspirin EC 81 MG tablet Take 1 tablet (81 mg total) by mouth daily. 05/23/18   Clent Demark, PA-C  atorvastatin (LIPITOR) 40 MG tablet Take 1 tablet (40 mg total) by mouth daily. 05/24/18   Clent Demark, PA-C  benzonatate (TESSALON) 200 MG capsule Take 1 capsule (200 mg total) by mouth 3 (three) times daily as needed for cough. 08/11/18   Wynona Luna, MD  DULoxetine (CYMBALTA) 30 MG capsule Take 1  capsule (30 mg total) by mouth daily. 05/23/18   Clent Demark, PA-C  gabapentin (NEURONTIN) 600 MG tablet Take 1 tablet (600 mg total) by mouth 3 (three) times daily. 05/23/18   Clent Demark, PA-C  hydrochlorothiazide (HYDRODIURIL) 12.5 MG tablet Take 1 tablet (12.5 mg total) by mouth daily. 05/23/18   Clent Demark, PA-C  lisinopril (PRINIVIL,ZESTRIL) 40 MG  tablet Take 1 tablet (40 mg total) by mouth daily. 05/23/18   Clent Demark, PA-C  metFORMIN (GLUCOPHAGE XR) 500 MG 24 hr tablet Take 1 tablet (500 mg total) by mouth daily with breakfast. 07/25/18   Fulp, Cammie, MD  naproxen (NAPROSYN) 500 MG tablet Take 1 tablet (500 mg total) by mouth 2 (two) times daily with a meal. 05/23/18   Clent Demark, PA-C  oseltamivir (TAMIFLU) 75 MG capsule Take 1 capsule (75 mg total) by mouth every 12 (twelve) hours. 08/11/18   Wynona Luna, MD  triamcinolone (NASACORT) 55 MCG/ACT AERO nasal inhaler Place 2 sprays into the nose daily. 08/11/18   Wynona Luna, MD    Family History Family History  Problem Relation Age of Onset  . Diabetes Mother   . Hypertension Mother   . Diabetes Sister   . Arthritis Sister   . Heart disease Sister   . Other Father        murdered  . Diabetes Sister   . Diabetes Sister   . Cancer Neg Hx   . Stroke Neg Hx     Social History Social History   Tobacco Use  . Smoking status: Never Smoker  . Smokeless tobacco: Never Used  Substance Use Topics  . Alcohol use: Yes    Comment: occasional beer  . Drug use: No     Allergies   Patient has no known allergies.   Review of Systems Review of Systems  All other systems reviewed and are negative.    Physical Exam Triage Vital Signs ED Triage Vitals  Enc Vitals Group     BP 08/11/18 1613 119/74     Pulse Rate 08/11/18 1613 85     Resp 08/11/18 1613 18     Temp 08/11/18 1613 98.3 F (36.8 C)     Temp Source 08/11/18 1613 Oral     SpO2 08/11/18 1613 99 %     Weight --      Height --      Pain Score 08/11/18 1617 7     Pain Loc --    Updated Vital Signs BP 119/74 (BP Location: Right Arm)   Pulse 85   Temp 98.3 F (36.8 C) (Oral)   Resp 18   SpO2 99%  Physical Exam Vitals signs and nursing note reviewed.  Constitutional:      Comments: Alert, nicely groomed Looks ill but not toxic.  Voice is very congested, mouth breathing.    HENT:     Head: Atraumatic.     Comments: Bilateral TMs are moderately dull, no erythema Marked nasal congestion bilaterally, essentially occluded, with clear rhinorrhea evident Mouth is a little bit dry with pink posterior pharynx Eyes:     Comments: Conjugate gaze, no eye redness/drainage  Neck:     Musculoskeletal: Neck supple.  Cardiovascular:     Rate and Rhythm: Normal rate and regular rhythm.  Pulmonary:     Effort: No respiratory distress.     Breath sounds: No wheezing or rales.     Comments: Slightly coarse but symmetric breath sounds throughout  Abdominal:     General: There is no distension.  Musculoskeletal: Normal range of motion.  Skin:    General: Skin is warm and dry.     Comments: No cyanosis  Neurological:     Mental Status: He is alert and oriented to person, place, and time.       Final Clinical Impressions(s) / UC Diagnoses   Final diagnoses:  Influenza-like illness  Sinus congestion     Discharge Instructions     Symptoms today seem most consistent with a flu like illness.  Prescriptions for oseltamivir (flu medicine), benzonatate (for cough) and a nasal steroid spray (for severe congestion) were sent to the pharmacy.  Anticipate gradual improvement in congestion, runny nose, well being, over the next several days.  Cough may take a couple weeks to subside.  Push fluids and rest.  Tylenol or ibuprofen otc as needed for fever, discomfort.  Recheck for persistent (>3 more days) fever >100.5, increasing phlegm production/nasal discharge, or if not starting to improve in a few days.      ED Prescriptions    Medication Sig Dispense Auth. Provider   oseltamivir (TAMIFLU) 75 MG capsule Take 1 capsule (75 mg total) by mouth every 12 (twelve) hours. 10 capsule Wynona Luna, MD   triamcinolone (NASACORT) 55 MCG/ACT AERO nasal inhaler Place 2 sprays into the nose daily. 1 Inhaler Wynona Luna, MD   benzonatate (TESSALON) 200 MG capsule Take 1  capsule (200 mg total) by mouth 3 (three) times daily as needed for cough. 30 capsule Wynona Luna, MD        Wynona Luna, MD 08/12/18 [phone]

## 2018-08-11 NOTE — ED Triage Notes (Signed)
Pt presents with upper respiratory cold symptoms; nasal drainage, congestion, chills, body aches, and headache.

## 2018-09-29 ENCOUNTER — Other Ambulatory Visit: Payer: Self-pay | Admitting: Primary Care

## 2018-09-29 DIAGNOSIS — I10 Essential (primary) hypertension: Secondary | ICD-10-CM

## 2018-09-29 MED ORDER — LISINOPRIL 40 MG PO TABS: 40.0000 mg | ORAL_TABLET | Freq: Every day | ORAL | 3 refills | Status: DC

## 2018-10-06 DIAGNOSIS — E1149 Type 2 diabetes mellitus with other diabetic neurological complication: Secondary | ICD-10-CM | POA: Diagnosis not present

## 2018-10-06 DIAGNOSIS — D509 Iron deficiency anemia, unspecified: Secondary | ICD-10-CM | POA: Diagnosis not present

## 2018-10-06 DIAGNOSIS — I10 Essential (primary) hypertension: Secondary | ICD-10-CM | POA: Diagnosis not present

## 2018-10-06 DIAGNOSIS — E7800 Pure hypercholesterolemia, unspecified: Secondary | ICD-10-CM | POA: Diagnosis not present

## 2018-10-06 DIAGNOSIS — Z1331 Encounter for screening for depression: Secondary | ICD-10-CM | POA: Diagnosis not present

## 2018-10-26 ENCOUNTER — Other Ambulatory Visit: Payer: Self-pay | Admitting: Pharmacist

## 2018-10-26 DIAGNOSIS — I10 Essential (primary) hypertension: Secondary | ICD-10-CM

## 2018-10-26 MED ORDER — HYDROCHLOROTHIAZIDE 12.5 MG PO TABS: 12.5000 mg | ORAL_TABLET | Freq: Every day | ORAL | 0 refills | Status: DC

## 2018-11-03 DIAGNOSIS — I10 Essential (primary) hypertension: Secondary | ICD-10-CM | POA: Diagnosis not present

## 2018-11-03 DIAGNOSIS — E7800 Pure hypercholesterolemia, unspecified: Secondary | ICD-10-CM | POA: Diagnosis not present

## 2018-11-03 DIAGNOSIS — D509 Iron deficiency anemia, unspecified: Secondary | ICD-10-CM | POA: Diagnosis not present

## 2018-11-03 DIAGNOSIS — E1149 Type 2 diabetes mellitus with other diabetic neurological complication: Secondary | ICD-10-CM | POA: Diagnosis not present

## 2018-11-06 DIAGNOSIS — J320 Chronic maxillary sinusitis: Secondary | ICD-10-CM | POA: Diagnosis not present

## 2018-11-06 DIAGNOSIS — N529 Male erectile dysfunction, unspecified: Secondary | ICD-10-CM | POA: Diagnosis not present

## 2018-11-06 DIAGNOSIS — J309 Allergic rhinitis, unspecified: Secondary | ICD-10-CM | POA: Diagnosis not present

## 2019-01-25 DIAGNOSIS — I10 Essential (primary) hypertension: Secondary | ICD-10-CM | POA: Diagnosis not present

## 2019-01-25 DIAGNOSIS — G4733 Obstructive sleep apnea (adult) (pediatric): Secondary | ICD-10-CM | POA: Diagnosis not present

## 2019-01-25 DIAGNOSIS — R600 Localized edema: Secondary | ICD-10-CM | POA: Diagnosis not present

## 2019-01-25 DIAGNOSIS — E1149 Type 2 diabetes mellitus with other diabetic neurological complication: Secondary | ICD-10-CM | POA: Diagnosis not present

## 2019-02-04 ENCOUNTER — Other Ambulatory Visit: Payer: Self-pay | Admitting: Family Medicine

## 2019-02-04 ENCOUNTER — Other Ambulatory Visit (INDEPENDENT_AMBULATORY_CARE_PROVIDER_SITE_OTHER): Payer: Self-pay | Admitting: Primary Care

## 2019-02-04 DIAGNOSIS — I10 Essential (primary) hypertension: Secondary | ICD-10-CM

## 2019-03-19 ENCOUNTER — Other Ambulatory Visit (INDEPENDENT_AMBULATORY_CARE_PROVIDER_SITE_OTHER): Payer: Self-pay | Admitting: Primary Care

## 2019-03-19 DIAGNOSIS — I10 Essential (primary) hypertension: Secondary | ICD-10-CM

## 2019-05-14 DIAGNOSIS — E1149 Type 2 diabetes mellitus with other diabetic neurological complication: Secondary | ICD-10-CM | POA: Diagnosis not present

## 2019-05-14 DIAGNOSIS — I10 Essential (primary) hypertension: Secondary | ICD-10-CM | POA: Diagnosis not present

## 2019-05-14 DIAGNOSIS — D509 Iron deficiency anemia, unspecified: Secondary | ICD-10-CM | POA: Diagnosis not present

## 2019-05-14 DIAGNOSIS — E7800 Pure hypercholesterolemia, unspecified: Secondary | ICD-10-CM | POA: Diagnosis not present

## 2019-10-01 ENCOUNTER — Ambulatory Visit: Payer: BLUE CROSS/BLUE SHIELD | Attending: Internal Medicine

## 2019-10-01 DIAGNOSIS — Z23 Encounter for immunization: Secondary | ICD-10-CM

## 2019-10-01 NOTE — Progress Notes (Signed)
   [id number]ination [facility]  Name:  Bhavin Nielsen Sr.    MRN: [id number] DOB: 28-Jul-1955  10/01/2019  Mr. Sidor was observed post Covid-19 immunization for 15 minutes without incident. He was provided with Vaccine Information Sheet and instruction to access the V-Safe system.   Mr. Ripley was instructed to call 911 with any severe reactions post vaccine: Marland Kitchen Difficulty breathing  . Swelling of face and throat  . A fast heartbeat  . A bad rash all over body  . Dizziness and weakness   Immunizations Administered    Name Date Dose VIS Date Route   Pfizer COVID-19 Vaccine 10/01/2019 10:45 AM 0.3 mL 06/15/2019 Intramuscular   Manufacturer: [place]   Lot: [id number]   [place]: [id number]

## 2019-10-24 ENCOUNTER — Ambulatory Visit: Payer: BLUE CROSS/BLUE SHIELD | Attending: Internal Medicine

## 2019-10-24 DIAGNOSIS — Z23 Encounter for immunization: Secondary | ICD-10-CM

## 2019-10-24 NOTE — Progress Notes (Signed)
   [id number] Vaccination Clinic  Name:  Matthew Fanta Sr.    MRN: [id number] DOB: 1956/06/01  10/24/2019  Matthew Colon was observed post Covid-19 immunization for 15 minutes without incident. He was provided with Vaccine Information Sheet and instruction to access the V-Safe system.   Matthew Colon was instructed to call 911 with any severe reactions post vaccine: Marland Kitchen Difficulty breathing  . Swelling of face and throat  . A fast heartbeat  . A bad rash all over body  . Dizziness and weakness   Immunizations Administered    Name Date Dose VIS Date Route   Pfizer COVID-19 Vaccine 10/24/2019  9:37 AM 0.3 mL 08/29/2018 Intramuscular   Manufacturer: [place]   Lot: [id number]   [place]: [id number]

## 2020-04-10 ENCOUNTER — Ambulatory Visit: Payer: BLUE CROSS/BLUE SHIELD | Attending: Internal Medicine

## 2020-04-10 DIAGNOSIS — Z23 Encounter for immunization: Secondary | ICD-10-CM

## 2020-04-10 NOTE — Progress Notes (Signed)
   [id number]  Name:  Winfield Caba Sr.    MRN: [id number] DOB: Oct 09, 1955  04/10/2020  Mr. Lucken was observed post Covid-19 immunization for 15 minutes without incident. He was provided with Vaccine Information Sheet and instruction to access the V-Safe system.   Mr. Juenger was instructed to call 911 with any severe reactions post vaccine: Marland Kitchen Difficulty breathing  . Swelling of face and throat  . A fast heartbeat  . A bad rash all over body  . Dizziness and weakness

## 2020-10-29 ENCOUNTER — Ambulatory Visit: Payer: 59 | Attending: Internal Medicine

## 2020-10-29 DIAGNOSIS — Z23 Encounter for immunization: Secondary | ICD-10-CM

## 2020-10-29 NOTE — Progress Notes (Signed)
   [id number]ination [facility]  Name:  Matthew Ingwersen Sr.    MRN: [id number] DOB: [birth year]  10/29/2020  Matthew Colon was observed post Covid-19 immunization for 15 minutes without incident. He was provided with Vaccine Information Sheet and instruction to access the V-Safe system.   Matthew Colon was instructed to call 911 with any severe reactions post vaccine: Marland Kitchen Difficulty breathing  . Swelling of face and throat  . A fast heartbeat  . A bad rash all over body  . Dizziness and weakness   Immunizations Administered    Name Date Dose VIS Date Route   PFIZER Comrnaty([place] TOP) Covid-19 Vaccine 10/29/2020  8:51 AM 0.3 mL 06/12/2020 Intramuscular   Manufacturer: [employer]   Lot: [id number]   ND[facility]: [phone]

## 2020-11-03 ENCOUNTER — Other Ambulatory Visit (HOSPITAL_COMMUNITY): Payer: Self-pay

## 2020-11-03 MED ORDER — COVID-19 MRNA VAC-TRIS(PFIZER) 30 MCG/0.3ML IM SUSP
INTRAMUSCULAR | 0 refills | Status: DC
  Filled 2020-11-03: qty 0.3, 17d supply, fill #0

## 2020-11-13 ENCOUNTER — Encounter: Payer: Self-pay | Admitting: Internal Medicine

## 2020-11-13 ENCOUNTER — Ambulatory Visit: Payer: 59 | Attending: Internal Medicine | Admitting: Internal Medicine

## 2020-11-13 ENCOUNTER — Other Ambulatory Visit: Payer: Self-pay

## 2020-11-13 VITALS — BP 144/84 | HR 94 | Resp 16 | Ht 71.0 in | Wt 305.6 lb

## 2020-11-13 DIAGNOSIS — E1169 Type 2 diabetes mellitus with other specified complication: Secondary | ICD-10-CM

## 2020-11-13 DIAGNOSIS — E785 Hyperlipidemia, unspecified: Secondary | ICD-10-CM

## 2020-11-13 DIAGNOSIS — E1142 Type 2 diabetes mellitus with diabetic polyneuropathy: Secondary | ICD-10-CM

## 2020-11-13 DIAGNOSIS — K219 Gastro-esophageal reflux disease without esophagitis: Secondary | ICD-10-CM | POA: Diagnosis not present

## 2020-11-13 DIAGNOSIS — M159 Polyosteoarthritis, unspecified: Secondary | ICD-10-CM | POA: Diagnosis not present

## 2020-11-13 DIAGNOSIS — K0889 Other specified disorders of teeth and supporting structures: Secondary | ICD-10-CM

## 2020-11-13 DIAGNOSIS — Z1211 Encounter for screening for malignant neoplasm of colon: Secondary | ICD-10-CM

## 2020-11-13 DIAGNOSIS — I152 Hypertension secondary to endocrine disorders: Secondary | ICD-10-CM

## 2020-11-13 DIAGNOSIS — R972 Elevated prostate specific antigen [PSA]: Secondary | ICD-10-CM

## 2020-11-13 DIAGNOSIS — Z7689 Persons encountering health services in other specified circumstances: Secondary | ICD-10-CM

## 2020-11-13 DIAGNOSIS — E1159 Type 2 diabetes mellitus with other circulatory complications: Secondary | ICD-10-CM

## 2020-11-13 DIAGNOSIS — Z79891 Long term (current) use of opiate analgesic: Secondary | ICD-10-CM

## 2020-11-13 LAB — POCT GLYCOSYLATED HEMOGLOBIN (HGB A1C): HbA1c, POC (controlled diabetic range): 6.2 % (ref 0.0–7.0)

## 2020-11-13 LAB — GLUCOSE, POCT (MANUAL RESULT ENTRY): POC Glucose: 155 mg/dl — AB (ref 70–99)

## 2020-11-13 MED ORDER — FLUTICASONE PROPIONATE 50 MCG/ACT NA SUSP: 1.0000 | Freq: Every day | NASAL | 2 refills | Status: DC

## 2020-11-13 MED ORDER — GABAPENTIN 600 MG PO TABS: 600.0000 mg | ORAL_TABLET | Freq: Three times a day (TID) | ORAL | 6 refills | Status: DC

## 2020-11-13 MED ORDER — METFORMIN HCL ER 500 MG PO TB24: 500.0000 mg | ORAL_TABLET | Freq: Every day | ORAL | 3 refills | Status: DC

## 2020-11-13 MED ORDER — ATORVASTATIN CALCIUM 80 MG PO TABS: 80.0000 mg | ORAL_TABLET | Freq: Every day | ORAL | 3 refills | Status: DC

## 2020-11-13 MED ORDER — PANTOPRAZOLE SODIUM 20 MG PO TBEC: 20.0000 mg | DELAYED_RELEASE_TABLET | Freq: Every day | ORAL | 1 refills | Status: DC

## 2020-11-13 MED ORDER — OLMESARTAN MEDOXOMIL 20 MG PO TABS
20.0000 mg | ORAL_TABLET | Freq: Every day | ORAL | 1 refills | Status: DC
Start: 2020-11-13 — End: 2021-09-03

## 2020-11-13 MED ORDER — TRAMADOL HCL 50 MG PO TABS: 50.0000 mg | ORAL_TABLET | Freq: Two times a day (BID) | ORAL | 0 refills | Status: DC | PRN

## 2020-11-13 NOTE — Progress Notes (Signed)
Pt is requesting a refill on Tramadol.

## 2020-11-13 NOTE — Progress Notes (Signed)
Patient ID: Matthew Favata Sr., male    DOB: Dec 04, 1955  MRN: [id number]  CC: New Patient (Initial Visit), Diabetes, and Hypertension   Subjective: Matthew Colon is a 65 y.o. male who presents for establish care His concerns today include:  Patient with history of HTN, DM peripheral neuropathy, HL, GERD, OA BL knees and LT shoulder, elevated PSA, ED, BPH, vitamin D deficiency,former smoker  Previous PCP was Sherrie Mustache, PA with [facility].  He is establishing care with [place] because he has changed his insurance carrier and [facility] does not take his new insurance.  C/o tooth ache RT upper jaw. Tooth was decayed. Saw dentist and had filling placed last Monday.  Tooth pain got worse since filling.  Has appt with dentist later today to have extraction. -He has been taking Ibuprofen 800 mg Q3-4 hrs since last night.  It has not helped.  Has chronic pain in BL knees and LT shoulder due to OA.  Told he needs jt replacement of LT shoulder Had inj to knees 07/2020 by ortho which helped.  Encouraged to lose wgh and smoking cessation Drives fork lift at work.  Climbing up and down the fork lift makes it worse Takes Tramadol which helps. Requests RF.  Last filled 10/20/2020.  Out early because he shared several with family member recently who was in pain  GERD:  On pantoprazole x 1 yr.  Pepcid does not work and gets reflux symptoms when off pantoprazole.  DM/Obesity: Results for orders placed or performed in visit on 11/13/20  POCT glucose (manual entry)  Result Value Ref Range   POC Glucose 155 (A) 70 - 99 mg/dl  POCT glycosylated hemoglobin (Hb A1C)  Result Value Ref Range   Hemoglobin A1C     HbA1c POC (<> result, manual entry)     HbA1c, POC (prediabetic range)     HbA1c, POC (controlled diabetic range) 6.2 0.0 - 7.0 %    On metformin.  Checks blood sugars infrequently. -started [employer] recently. Walks on treadmill. Goes 2-3 times a wk. -"I don't eat  much."  Drinks diet drinks including Crystal Lite Never saw nutritionist Endorses numbness in feet.  Has diabetic neuropathy.  Takes gabapentin 600 mg 2-3 times a day which he finds helpful. On atorvastatin for high cholesterol.   HTN:  No device to check BP Compliant with taking Benicar.  He tries to limit salt in the foods. No CP/SOB-headache/dizziness  Elev PSA:  PSA 17 in 07/2020.  Has appt with urologist 11/20/2020  HM:  C-scope schedule for next mth  Patient Active Problem List   Diagnosis Date Noted  . DDD (degenerative disc disease), cervical 09/21/2017  . DJD of acromioclavicular joint (Right) 09/21/2017  . DJD of acromioclavicular joint (Bilateral) 09/21/2017  . DJD of acromioclavicular joint (Left) 09/21/2017  . Osteoarthritis of glenohumeral joint (Left) 09/21/2017  . 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/2017  . Tricompartmental disease of knee (Bilateral) 09/21/2017  . Osteoarthritis of knees (Bilateral) 09/21/2017  . Suprapatellar effusion of knee (recurrent) (Right) 09/21/2017  . Calcaneal spur of feet (Bilateral) 09/21/2017  . Hypomagnesemia 09/20/2017  . Low testosterone 09/20/2017  . Chronic foot pain (Bilateral) 09/20/2017  . Vitamin D deficiency 04/04/2017  . Chronic shoulder pain (Primary Area of Pain) (Left) 03/30/2017  . Chronic knee pain [facility] Area of Pain) (Left) 03/30/2017  . Disorder of bone, unspecified 03/30/2017  . Other reduced mobility 03/30/2017  . Other specified health status 03/30/2017  . Long  term current use of opiate analgesic 03/30/2017  . Opiate use 03/30/2017  . Chronic pain syndrome 03/30/2017  . Chronic upper extremity pain (Secondary Area of Pain) (Bilateral) 03/30/2017  . Elevated PSA 12/05/2015  . Noncompliance 12/05/2015  . Hypertensive disease 03/14/2015  . Type 2 diabetes mellitus with diabetic mononeuropathy ([place]) 03/14/2015  . Encounter for health maintenance examination in adult 03/14/2015  . Screening for prostate  cancer 03/14/2015  . Special screening for malignant neoplasms, colon 03/14/2015  . Hyperlipidemia 03/14/2015  . Nocturia 03/14/2015  . Urinary frequency 03/14/2015  . Need for prophylactic vaccination and inoculation against influenza 03/14/2015  . Need for prophylactic vaccination against Streptococcus pneumoniae (pneumococcus) 03/14/2015  . Need for Tdap vaccination 03/14/2015  . Chronic nausea 03/14/2015  . Gastroesophageal reflux disease without esophagitis 03/14/2015     Current Outpatient Medications on File Prior to Visit  Medication Sig Dispense Refill  . aspirin EC 81 MG tablet Take 1 tablet (81 mg total) by mouth daily. 90 tablet 3  . COVID-19 mRNA Vac-TriS, Pfizer, SUSP injection Inject into the muscle. 0.3 mL 0   No current facility-administered medications on file prior to visit.    No Known Allergies  Social History   Socioeconomic History  . Marital status: Divorced    Spouse name: Not on file  . Number of children: Not on file  . Years of education: Not on file  . Highest education level: Not on file  Occupational History  . Not on file  Tobacco Use  . Smoking status: Never Smoker  . Smokeless tobacco: Never Used  Vaping Use  . Vaping Use: Never used  Substance and Sexual Activity  . Alcohol use: Yes    Comment: occasional beer  . Drug use: No  . Sexual activity: Yes  Other Topics Concern  . Not on file  Social History Narrative   Lives with his sister.   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   Exercise - active on the job.  Has 3 children in [place].   Social Determinants of Health   Financial Resource Strain: Not on file  Food Insecurity: Not on file  Transportation Needs: Not on file  Physical Activity: Not on file  Stress: Not on file  Social Connections: Not on file  Intimate Partner Violence: Not on file    Family History  Problem Relation Age of Onset  . Diabetes Mother   . Hypertension Mother   . Diabetes Sister   . Arthritis  Sister   . Heart disease Sister   . Other Father        murdered  . Diabetes Sister   . Diabetes Sister   . Cancer Neg Hx   . Stroke Neg Hx     Past Surgical History:  Procedure Laterality Date  . COLONOSCOPY     referral pending 03/2015  . NO PAST SURGERIES     as of 03/2015    ROS: Review of Systems Negative except as stated above  PHYSICAL EXAM: BP (!) 144/84   Pulse 94   Resp 16   Ht 5\' 11"  (1.803 m)   Wt (!) 305 lb 9.6 oz (138.6 kg)   SpO2 99%   BMI 42.62 kg/m   Wt Readings from Last 3 Encounters:  11/13/20 (!) 305 lb 9.6 oz (138.6 kg)  05/23/18 (!) 304 lb 9.6 oz (138.2 kg)  05/06/18 300 lb (136.1 kg)   Repeat BP 150/90 Physical Exam  General appearance - alert, well  appearing, older African-American male.  He appeared uncomfortable due to tooth ache.   Mental status - alert, oriented to person, place, and time Eyes - pink conjunctiva Nose - normal and patent, no erythema, discharge or polyps Mouth -no swelling of the cheeks noted.  Oral mucosa moist.  No periodontal abscess noted.  Throat without erythema or exudate. Neck - supple, no significant adenopathy Chest - clear to auscultation, no wheezes, rales or rhonchi, symmetric air entry Heart - normal rate, regular rhythm, normal S1, S2, no murmurs, rubs, clicks or gallops Musculoskeletal -knees: jts enlarged Extremities - no LE edema Diabetic Foot Exam - Simple   Simple Foot Form Visual Inspection No deformities, no ulcerations, no other skin breakdown bilaterally: Yes Sensation Testing Intact to touch and monofilament testing bilaterally: Yes Pulse Check Posterior Tibialis and Dorsalis pulse intact bilaterally: Yes Comments    Opioid Risk  11/13/2020  Alcohol 3  Illegal Drugs 0  Rx Drugs 0  Alcohol 0  Illegal Drugs 0  Rx Drugs 0  History of Preadolescent Sexual Abuse 0  Psychological Disease 0  Depression 0  Opioid Risk Tool Scoring 3  Opioid Risk Interpretation Low Risk   Depression  screen [facility] 2/9 11/13/2020 05/23/2018 04/15/2017  Decreased Interest 0 3 0  Down, Depressed, Hopeless 0 0 0  PHQ - 2 Score 0 3 0  Altered sleeping - 3 -  Tired, decreased energy - 3 -  Change in appetite - 3 -  Feeling bad or failure about yourself  - 0 -  Trouble concentrating - 1 -  Moving slowly or fidgety/restless - 0 -  Suicidal thoughts - 0 -  PHQ-9 Score - 13 -      CMP Latest Ref Rng & Units 05/06/2018 03/30/2017 12/05/2015  Glucose 70 - 99 mg/dL 102(H) 108(H) 125(H)  BUN 8 - 23 mg/dL 22 21(H) 15  Creatinine 0.61 - 1.24 mg/dL 1.24 1.11 1.18  Sodium 135 - 145 mmol/L 140 143 139  Potassium 3.5 - 5.1 mmol/L 3.7 4.1 5.0  Chloride 98 - 111 mmol/L 108 108 101  CO2 22 - 32 mmol/L 24 25 27   Calcium 8.9 - 10.3 mg/dL 9.3 9.7 9.3  Total Protein 6.5 - 8.1 g/dL 6.5 7.1 6.9  Total Bilirubin 0.3 - 1.2 mg/dL 0.4 0.5 0.5  Alkaline Phos 38 - 126 U/L 55 53 80  AST 15 - 41 U/L 17 26 14   ALT 0 - 44 U/L 8 10(L) 8(L)   Lipid Panel     Component Value Date/Time   CHOL 231 (H) 05/23/2018 0922   TRIG 50 05/23/2018 0922   HDL 75 05/23/2018 0922   CHOLHDL 3.1 05/23/2018 0922   CHOLHDL 2.8 03/14/2015 0001   VLDL 17 03/14/2015 0001   LDLCALC 146 (H) 05/23/2018 0922    CBC    Component Value Date/Time   WBC 10.9 (H) 05/06/2018 0805   RBC 4.09 (L) 05/06/2018 0805   HGB 11.5 (L) 05/06/2018 0805   HCT 35.2 (L) 05/06/2018 0805   PLT 348 05/06/2018 0805   MCV 86.1 05/06/2018 0805   MCH 28.1 05/06/2018 0805   MCHC 32.7 05/06/2018 0805   RDW 14.1 05/06/2018 0805   LYMPHSABS 2.8 05/06/2018 0805   MONOABS 0.8 05/06/2018 0805   EOSABS 0.2 05/06/2018 0805   BASOSABS 0.1 05/06/2018 0805    ASSESSMENT AND PLAN: 1. Encounter to establish care   2. Type 2 diabetes mellitus with peripheral neuropathy (HCC) At goal. Discussed and encourage healthy eating habits.  Encouraged him to move as much as he can.  Commended him on joining the gym. Continue metformin - POCT glucose (manual entry) -  POCT glycosylated hemoglobin (Hb A1C) - Microalbumin / creatinine urine ratio - metFORMIN (GLUCOPHAGE XR) 500 MG 24 hr tablet; Take 1 tablet (500 mg total) by mouth daily with breakfast.  Dispense: 30 tablet; Refill: 3 - gabapentin (NEURONTIN) 600 MG tablet; Take 1 tablet (600 mg total) by mouth 3 (three) times daily.  Dispense: 90 tablet; Refill: 6 - Amb ref to Medical Nutrition Therapy-MNT - Ambulatory referral to Ophthalmology  3. Hypertension associated with diabetes ([place]) Not at goal and elevated likely because he is in pain today and also has been using ibuprofen over the past 24 hours.  Continue current dose of Benicar.  Have him follow-up with the clinical pharmacist in several weeks for recheck - olmesartan (BENICAR) 20 MG tablet; Take 1 tablet (20 mg total) by mouth daily.  Dispense: 90 tablet; Refill: 1  4. Hyperlipidemia associated with type 2 diabetes mellitus (HCC) - atorvastatin (LIPITOR) 80 MG tablet; Take 1 tablet (80 mg total) by mouth daily.  Dispense: 90 tablet; Refill: 3  5. Morbid obesity ([place]) See #1 above. Encouraged weight loss which will help decrease the pain from osteoarthritis in his knees. - Amb ref to Medical Nutrition Therapy-MNT  6. Gastroesophageal reflux disease without esophagitis GERD precautions discussed.  Unable to step down to Pepcid at this time - pantoprazole (PROTONIX) 20 MG tablet; Take 1 tablet (20 mg total) by mouth daily.  Dispense: 90 tablet; Refill: 1  7. Polyarticular osteoarthritis Stressed importance of weight loss. -Opioid risk assessment done.  Patient assessed to be at low risk.  However I concerned about him sharing his tramadol with family members and have advised against this practice as it is a controlled substance.  He expressed understanding. -Inform patient of the risks of addiction and or dependence on controlled substances.  Went over our controlled substance prescribing agreement with him.  He is agreeable to signing a  contract with [place]. - He has tolerated tramadol without significant side effects.  [place] controlled substance reporting system reviewed.  He did receive 2 prescriptions recently for short courses of Tylenol with codeine from his dentist. - [id number] 11+Oxyco+Alc+Crt-Bund  8. Tooth ache Keep appointment with dentist today  9. Opioid use agreement exists - [id number] 11+Oxyco+Alc+Crt-Bund - traMADol (ULTRAM) 50 MG tablet; Take 1 tablet (50 mg total) by mouth every 12 (twelve) hours as needed (Fill on or after 11/15/2020).  Dispense: 60 tablet; Refill: 0  10. Elevated PSA Keep appointment with urologist.  Inform patient that this is concerning and he needs to be evaluated for underlying malignancy.  11. Screening for colon cancer Advised to keep appointment for colonoscopy next month.     Patient was given the opportunity to ask questions.  Patient verbalized understanding of the plan and was able to repeat key elements of the plan.   Orders Placed This Encounter  Procedures  . Microalbumin / creatinine urine ratio  . [id number] 11+Oxyco+Alc+Crt-Bund  . Amb ref to Medical Nutrition Therapy-MNT  . Ambulatory referral to Ophthalmology  . POCT glucose (manual entry)  . POCT glycosylated hemoglobin (Hb A1C)     Requested Prescriptions   Signed Prescriptions Disp Refills  . metFORMIN (GLUCOPHAGE XR) 500 MG 24 hr tablet 30 tablet 3    Sig: Take 1 tablet (500 mg total) by mouth daily with breakfast.  . olmesartan (BENICAR) 20 MG tablet 90 tablet  1    Sig: Take 1 tablet (20 mg total) by mouth daily.  Marland Kitchen gabapentin (NEURONTIN) 600 MG tablet 90 tablet 6    Sig: Take 1 tablet (600 mg total) by mouth 3 (three) times daily.  . pantoprazole (PROTONIX) 20 MG tablet 90 tablet 1    Sig: Take 1 tablet (20 mg total) by mouth daily.  Marland Kitchen atorvastatin (LIPITOR) 80 MG tablet 90 tablet 3    Sig: Take 1 tablet (80 mg total) by mouth daily.  . fluticasone (FLONASE) 50 MCG/ACT nasal spray 16 g 2    Sig:  Place 1 spray into both nostrils daily.  . traMADol (ULTRAM) 50 MG tablet 60 tablet 0    Sig: Take 1 tablet (50 mg total) by mouth every 12 (twelve) hours as needed (Fill on or after 11/15/2020).    Return in about 4 months (around 03/16/2021) for Give appt with [facility] in 2 wks for BP recheck.  Jonah Blue, MD, FACP

## 2020-11-14 LAB — MICROALBUMIN / CREATININE URINE RATIO
Creatinine, Urine: 133.3 mg/dL
Microalb/Creat Ratio: 66 mg/g creat — ABNORMAL HIGH (ref 0–29)
Microalbumin, Urine: 87.9 ug/mL

## 2020-11-17 ENCOUNTER — Telehealth: Payer: Self-pay | Admitting: Internal Medicine

## 2020-11-17 NOTE — Telephone Encounter (Signed)
[place] calling to let the dr know pt was prescribed Tylenol #3 for 2 days on May 10  th from the dentist.  Pt told dentist he was no longer taking tramadol Now pharmacy recieved the rx tramadol . pharmacy wants to confirm with the dr it is OK to fill this Rx Would like a call back asap because the pt keeps calling

## 2020-11-17 NOTE — Telephone Encounter (Signed)
Will forward to provider  

## 2020-11-17 NOTE — Telephone Encounter (Signed)
Called pharmacy. MD Message given

## 2020-11-17 NOTE — Telephone Encounter (Signed)
Pharmacy called again/ she said the pt has called twice/ they want to confirm that it is ok to fill the Tramadol for this pt/ please advise asap

## 2020-11-24 LAB — DRUG SCREEN 764883 11+OXYCO+ALC+CRT-BUND
Amphetamines, Urine: NEGATIVE ng/mL
BENZODIAZ UR QL: NEGATIVE ng/mL
Barbiturate: NEGATIVE ng/mL
Cannabinoid Quant, Ur: NEGATIVE ng/mL
Cocaine (Metabolite): NEGATIVE ng/mL
Creatinine: 142.3 mg/dL (ref 20.0–300.0)
Ethanol: NEGATIVE %
Meperidine: NEGATIVE ng/mL
Methadone Screen, Urine: NEGATIVE ng/mL
Oxycodone/Oxymorphone, Urine: NEGATIVE ng/mL
Phencyclidine: NEGATIVE ng/mL
Propoxyphene: NEGATIVE ng/mL
Tramadol: NEGATIVE ng/mL
pH, Urine: 5.6 (ref 4.5–8.9)

## 2020-11-24 LAB — OPIATES CONFIRMATION, URINE
Codeine Confirm: >3000 ng/mL
Codeine: POSITIVE — AB
Hydrocodone: NEGATIVE
Hydromorphone: NEGATIVE
Morphine Confirm: 1694 ng/mL
Morphine: POSITIVE — AB
Opiates: POSITIVE ng/mL — AB

## 2020-11-28 ENCOUNTER — Ambulatory Visit: Payer: 59 | Admitting: Pharmacist

## 2020-12-02 ENCOUNTER — Ambulatory Visit: Payer: 59 | Admitting: Pharmacist

## 2020-12-02 ENCOUNTER — Telehealth: Payer: Self-pay | Admitting: Internal Medicine

## 2020-12-02 DIAGNOSIS — R972 Elevated prostate specific antigen [PSA]: Secondary | ICD-10-CM

## 2020-12-02 NOTE — Telephone Encounter (Signed)
Patient called to cancel his appt. With [facility] this morning.  He said he is not feeling well. Please call patient to reschedule.

## 2020-12-02 NOTE — Telephone Encounter (Signed)
Call placed to patient to reschedule appointment with [facility]. While on the phone patient requested a referral to urology to be placed. He stated he had an appointment with a urology office from referral from previous provider but he was unable to complete the appointment due to the urologist not accepting [employer] health plan. Patient requesting new referral for urology and stated he spoke with Dr. Wynetta Emery about need for that appointment at his initial visit. Please advise.

## 2020-12-02 NOTE — Telephone Encounter (Signed)
Will forward to provider to place referral  

## 2020-12-08 ENCOUNTER — Other Ambulatory Visit: Payer: Self-pay | Admitting: Internal Medicine

## 2020-12-08 DIAGNOSIS — Z79891 Long term (current) use of opiate analgesic: Secondary | ICD-10-CM

## 2020-12-08 NOTE — Telephone Encounter (Signed)
Requested medication (s) are due for refill today: no  Requested medication (s) are on the active medication list: yes  Last refill:  11/13/20  Future visit scheduled: yes  Notes to clinic:  med not delegated to NT to RF   Requested Prescriptions  Pending Prescriptions Disp Refills   traMADol (ULTRAM) 50 MG tablet [Pharmacy Med Name: traMADol HCl 50 MG Oral Tablet] 60 tablet 0    Sig: TAKE 1 TABLET BY MOUTH EVERY 12 HOURS AS NEEDED (FILL  ON  OR  AFTER  11-15-20)      Not Delegated - Analgesics:  Opioid Agonists Failed - 12/08/2020  2:03 PM      Failed - This refill cannot be delegated      Passed - Urine Drug Screen completed in last 360 days      Passed - Valid encounter within last 6 months    Recent Outpatient Visits           3 weeks ago Encounter to establish care   [place], Deborah B, MD   2 years ago Type 2 diabetes mellitus with diabetic mononeuropathy, without long-term current use of insulin ([place])   [place], Roger David, PA-C   3 years ago Type 2 diabetes mellitus with diabetic mononeuropathy, without long-term current use of insulin ([place])   [place], Roger David, PA-C   3 years ago Essential hypertension   [place], Roger David, PA-C   4 years ago Type 2 diabetes mellitus with diabetic mononeuropathy, without long-term current use of insulin ([place])   [place] Clent Dem[place], PA-C       Future Appointments             In 1 week Daisy Blossom, [name], [place]   In 3 months Wynetta Emery, [name], MD [place]

## 2020-12-09 ENCOUNTER — Telehealth: Payer: Self-pay | Admitting: Internal Medicine

## 2020-12-09 NOTE — Telephone Encounter (Signed)
Copied from [place] [phone]. Topic: Referral - Status >> Dec 08, 2020  1:44 PM Alanda Slim E wrote: Reason for CRM: Pt called and [facility] is not in network with his [employer] / Pts needs a new referral placed with another location / please advise pt when placed

## 2020-12-10 NOTE — Telephone Encounter (Signed)
Returned pt call to make that he will need to contact his insurance company to see what urologist is in network with pt didn't answer left a detailed vm

## 2020-12-16 ENCOUNTER — Encounter: Payer: Self-pay | Admitting: Pharmacist

## 2020-12-16 ENCOUNTER — Ambulatory Visit: Payer: 59 | Attending: Internal Medicine | Admitting: Pharmacist

## 2020-12-16 ENCOUNTER — Telehealth: Payer: Self-pay | Admitting: Internal Medicine

## 2020-12-16 ENCOUNTER — Other Ambulatory Visit: Payer: Self-pay

## 2020-12-16 VITALS — BP 121/80

## 2020-12-16 DIAGNOSIS — E1159 Type 2 diabetes mellitus with other circulatory complications: Secondary | ICD-10-CM

## 2020-12-16 DIAGNOSIS — I152 Hypertension secondary to endocrine disorders: Secondary | ICD-10-CM | POA: Diagnosis not present

## 2020-12-16 DIAGNOSIS — Z79891 Long term (current) use of opiate analgesic: Secondary | ICD-10-CM

## 2020-12-16 NOTE — Progress Notes (Signed)
   S:    Patient arrives in good spirits on 12/16/20. Presents to the clinic for hypertension evaluation, counseling, and management. Patient was referred and last seen by Primary Care Provider on 11/13/20. BP at that visit was elevated but pt endorsed tooth pain. No changes were made to his medications.  Medication adherence reported. Patient reports no HA, dizziness, chest pain or dyspnea.  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 Family / Social history:  Fhx: HTN, DMT2, Cancer, Heart Disease, Stroke Tobacco Use: Never Alcohol Use: None reported   O:  Vitals:   12/16/20 0908  BP: 121/80   Home BP readings: none  Last 3 Office BP readings: BP Readings from Last 3 Encounters:  12/16/20 121/80  11/13/20 (!) 144/84  08/11/18 119/74    BMET    Component Value Date/Time   NA 140 05/06/2018 0805   K 3.7 05/06/2018 0805   CL 108 05/06/2018 0805   CO2 24 05/06/2018 0805   GLUCOSE 102 (H) 05/06/2018 0805   BUN 22 05/06/2018 0805   CREATININE 1.24 05/06/2018 0805   CREATININE 1.18 12/05/2015 0841   CALCIUM 9.3 05/06/2018 0805   GFRNONAA >60 05/06/2018 0805   GFRAA >60 05/06/2018 0805    Renal function: CrCl cannot be calculated (Patient's most recent lab result is older than the maximum 21 days allowed.).  Clinical ASCVD: No  The 10-year ASCVD risk score Mikey Bussing DC Jr., et al., 2013) is: 21.5%   Values used to calculate the score:     Age: 65 years     Sex: Male     Is Non-Hispanic African American: Yes     Diabetic: Yes     Tobacco smoker: No     Systolic Blood Pressure: 416 mmHg     Is BP treated: Yes     HDL Cholesterol: 79 MG/DL      Total Cholesterol: 201 MG/DL  A/P: Hypertension longstanding currently controlled on current medications. BP Goal = <130/80 mmHg. Medication adherence reported.  -Continued Benicar 20mg  once daily.  -F/u labs ordered - CMP14+eGFR -Counseled on lifestyle modifications for blood pressure control including reduced dietary sodium, increased exercise, adequate sleep.  Results reviewed and written information provided. Total time in face-to-face counseling 30 minutes.   F/U Clinic Visit in 1 month.    Patient seen with:  Dorian Furnace PharmDMBA Candidate [employer] [place], PharmD, [place], [place] [phone]

## 2020-12-16 NOTE — Telephone Encounter (Signed)
Patient came into the clinic today for an appointment with [facility]. While here he is asking for a refill on tramadol. Please advise.

## 2020-12-16 NOTE — Telephone Encounter (Signed)
Will forward to provider  

## 2020-12-17 LAB — CMP14+EGFR
ALT: 7 IU/L (ref 0–44)
AST: 17 IU/L (ref 0–40)
Albumin/Globulin Ratio: 1.7 (ref 1.2–2.2)
Albumin: 4.3 g/dL (ref 3.8–4.8)
Alkaline Phosphatase: 93 IU/L (ref 44–121)
BUN/Creatinine Ratio: 9 — ABNORMAL LOW (ref 10–24)
BUN: 11 mg/dL (ref 8–27)
Bilirubin Total: 0.4 mg/dL (ref 0.0–1.2)
CO2: 21 mmol/L (ref 20–29)
Calcium: 9.8 mg/dL (ref 8.6–10.2)
Chloride: 103 mmol/L (ref 96–106)
Creatinine, Ser: 1.19 mg/dL (ref 0.76–1.27)
Globulin, Total: 2.5 g/dL (ref 1.5–4.5)
Glucose: 86 mg/dL (ref 65–99)
Potassium: 4.4 mmol/L (ref 3.5–5.2)
Sodium: 141 mmol/L (ref 134–144)
Total Protein: 6.8 g/dL (ref 6.0–8.5)
eGFR: 69 mL/min/{1.73_m2} (ref >59)

## 2020-12-17 MED ORDER — TRAMADOL HCL 50 MG PO TABS: 50.0000 mg | ORAL_TABLET | Freq: Two times a day (BID) | ORAL | 0 refills | Status: DC | PRN

## 2020-12-17 NOTE — Telephone Encounter (Signed)
Contacted pt and went over labs results and made pt aware that rx was sent

## 2020-12-24 ENCOUNTER — Encounter: Payer: 59 | Admitting: Dietician

## 2020-12-24 ENCOUNTER — Telehealth: Payer: Self-pay | Admitting: Internal Medicine

## 2020-12-24 NOTE — Telephone Encounter (Signed)
Matthew Colon could we refer somewhere else

## 2020-12-24 NOTE — Telephone Encounter (Signed)
Copied from [place] [id number]. Topic: Referral - Status >> Dec 23, 2020  9:53 AM Yvette Rack wrote: Reason for CRM: Pt stated he had an appt with [facility] for a colonoscopy but he was told that they are not in network with his insurance. Pt requests referral for colonoscopy. Cb# [phone]

## 2020-12-25 NOTE — Telephone Encounter (Signed)
Good Morning  I don't see a Gi referral placed in his chart to sent somewhere else  Thank You.

## 2020-12-25 NOTE — Telephone Encounter (Signed)
Returned pt call and left detailed vm making pt aware that he will need to contact his insurance company to see what GI specialist is in network with his insurance because we have no way of knowing. Made pt aware that once he found out information to give [place] a call

## 2020-12-29 ENCOUNTER — Ambulatory Visit: Payer: 59 | Admitting: Urology

## 2021-01-01 ENCOUNTER — Ambulatory Visit: Payer: 59 | Admitting: Urology

## 2021-01-01 NOTE — Progress Notes (Deleted)
Subjective: 1. Elevated PSA      Consult requested by *** *** ROS:  ROS  No Known Allergies  Past Medical History:  Diagnosis Date   Allergy    Foot pain, bilateral    referred to podiatry 06/2014   GERD (gastroesophageal reflux disease)    long history of   Hyperlipidemia    Hypertension    Polyarthralgia    shoulders, knees, ankles, wrists; neg rheum lab screen 04/2014   Type 2 diabetes mellitus with diabetic neuropathy ([place])    Wears glasses     Past Surgical History:  Procedure Laterality Date   COLONOSCOPY     referral pending 03/2015   NO PAST SURGERIES     as of 03/2015    Social History   Socioeconomic History   Marital status: Divorced    Spouse name: Not on file   Number of children: Not on file   Years of education: Not on file   Highest education level: Not on file  Occupational History   Not on file  Tobacco Use   Smoking status: Never   Smokeless tobacco: Never  Vaping Use   Vaping Use: Never used  Substance and Sexual Activity   Alcohol use: Yes    Comment: occasional beer   Drug use: No   Sexual activity: Yes  Other Topics Concern   Not on file  Social History Narrative   Lives with his sister.   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   Exercise - active on the job.  Has 3 children in [place].   Social Determinants of Health   Financial Resource Strain: Not on file  Food Insecurity: Not on file  Transportation Needs: Not on file  Physical Activity: Not on file  Stress: Not on file  Social Connections: Not on file  Intimate Partner Violence: Not on file    Family History  Problem Relation Age of Onset   Diabetes Mother    Hypertension Mother    Diabetes Sister    Arthritis Sister    Heart disease Sister    Other Father        murdered   Diabetes Sister    Diabetes Sister    Cancer Neg Hx    Stroke Neg Hx     Anti-infectives: Anti-infectives (From admission, onward)    None       Current Outpatient Medications   Medication Sig Dispense Refill   aspirin EC 81 MG tablet Take 1 tablet (81 mg total) by mouth daily. 90 tablet 3   atorvastatin (LIPITOR) 80 MG tablet Take 1 tablet (80 mg total) by mouth daily. 90 tablet 3   COVID-19 mRNA Vac-TriS, Pfizer, SUSP injection Inject into the muscle. 0.3 mL 0   fluticasone (FLONASE) 50 MCG/ACT nasal spray Place 1 spray into both nostrils daily. 16 g 2   gabapentin (NEURONTIN) 600 MG tablet Take 1 tablet (600 mg total) by mouth 3 (three) times daily. 90 tablet 6   metFORMIN (GLUCOPHAGE XR) 500 MG 24 hr tablet Take 1 tablet (500 mg total) by mouth daily with breakfast. 30 tablet 3   olmesartan (BENICAR) 20 MG tablet Take 1 tablet (20 mg total) by mouth daily. 90 tablet 1   pantoprazole (PROTONIX) 20 MG tablet Take 1 tablet (20 mg total) by mouth daily. 90 tablet 1   traMADol (ULTRAM) 50 MG tablet Take 1 tablet (50 mg total) by mouth every 12 (twelve) hours as needed. 60 tablet 0  No current facility-administered medications for this visit.     Objective: Vital signs in last 24 hours: There were no vitals taken for this visit.  Intake/Output from previous day: No intake/output data recorded. Intake/Output this shift: @IOTHISSHIFT @   Physical Exam  Lab Results:  No results found for this or any previous visit (from the past 24 hour(s)).  BMET No results for input(s): NA, K, CL, CO2, GLUCOSE, BUN, CREATININE, CALCIUM in the last 72 hours. PT/INR No results for input(s): LABPROT, INR in the last 72 hours. ABG No results for input(s): PHART, HCO3 in the last 72 hours.  Invalid input(s): PCO2, PO2  Studies/Results: No results found.   Assessment/Plan: No problem-specific Assessment & Plan notes found for this encounter.   No orders of the defined types were placed in this encounter.    No orders of the defined types were placed in this encounter.    No follow-ups on file.    CC: ***     Irine Seal 01/01/2021 [phone]

## 2021-01-07 ENCOUNTER — Ambulatory Visit: Payer: 59 | Admitting: Urology

## 2021-01-14 ENCOUNTER — Other Ambulatory Visit: Payer: Self-pay | Admitting: Internal Medicine

## 2021-01-14 DIAGNOSIS — Z79891 Long term (current) use of opiate analgesic: Secondary | ICD-10-CM

## 2021-01-14 NOTE — Telephone Encounter (Signed)
Requested medication (s) are due for refill today: yes  Requested medication (s) are on the active medication list: yes  Last refill:  12/17/2020  Future visit scheduled: yes  Notes to clinic:  this refill cannot be delegated    Requested Prescriptions  Pending Prescriptions Disp Refills   traMADol (ULTRAM) 50 MG tablet [Pharmacy Med Name: traMADol HCl 50 MG Oral Tablet] 60 tablet 0    Sig: TAKE 1 TABLET BY MOUTH EVERY 12 HOURS AS NEEDED      Not Delegated - Analgesics:  Opioid Agonists Failed - 01/14/2021  9:49 AM      Failed - This refill cannot be delegated      Passed - Urine Drug Screen completed in last 360 days      Passed - Valid encounter within last 6 months    Recent Outpatient Visits           4 weeks ago Hypertension associated with diabetes [facility])   [place], [name], RPH-CPP   2 months ago Encounter to establish care   [place], Deborah B, MD   2 years ago Type 2 diabetes mellitus with diabetic mononeuropathy, without long-term current use of insulin ([place])   [place], Roger David, PA-C   3 years ago Type 2 diabetes mellitus with diabetic mononeuropathy, without long-term current use of insulin ([place])   [place], Roger David, PA-C   4 years ago Essential hypertension   [place], Roger David, PA-C       Future Appointments             Tomorrow Daisy Bloss[place], [name], [place]   In 1 week Diamantina [place], Herbert Seta, MD [place]   In 2 months Ladell Pier, MD [place]

## 2021-01-15 ENCOUNTER — Ambulatory Visit: Payer: 59 | Attending: Internal Medicine | Admitting: Pharmacist

## 2021-01-15 ENCOUNTER — Encounter: Payer: Self-pay | Admitting: Pharmacist

## 2021-01-15 ENCOUNTER — Other Ambulatory Visit: Payer: Self-pay

## 2021-01-15 VITALS — BP 122/68

## 2021-01-15 DIAGNOSIS — E1159 Type 2 diabetes mellitus with other circulatory complications: Secondary | ICD-10-CM

## 2021-01-15 DIAGNOSIS — I152 Hypertension secondary to endocrine disorders: Secondary | ICD-10-CM | POA: Diagnosis not present

## 2021-01-15 NOTE — Progress Notes (Signed)
   S:    Patient arrives in good spirits on 12/16/20. Presents to the clinic for hypertension evaluation, counseling, and management. Patient was referred and last seen by Primary Care Provider on 11/13/20. We saw him 1 month ago and his BP was at goal.  Medication adherence reported. Patient reports no HA, dizziness, chest pain or dyspnea.  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 Family / Social history:  Fhx: HTN, DMT2, Cancer, Heart Disease, Stroke Tobacco Use: Never Alcohol Use: None reported   O:  Vitals:   01/15/21 0852  BP: 122/68   Home BP readings: none  Last 3 Office BP readings: BP Readings from Last 3 Encounters:  01/15/21 122/68  12/16/20 121/80  11/13/20 (!) 144/84    BMET    Component Value Date/Time   NA 141 12/16/2020 0913   K 4.4 12/16/2020 0913   CL 103 12/16/2020 0913   CO2 21 12/16/2020 0913   GLUCOSE 86 12/16/2020 0913   GLUCOSE 102 (H) 05/06/2018 0805   BUN 11 12/16/2020 0913   CREATININE 1.19 12/16/2020 0913   CREATININE 1.18 12/05/2015 0841   CALCIUM 9.8 12/16/2020 0913   GFRNONAA >60 05/06/2018 0805   GFRAA >60 05/06/2018 0805    Renal function: CrCl cannot be calculated (Patient's most recent lab result is older than the maximum 21 days allowed.).  Clinical ASCVD: No  The 10-year ASCVD risk score Mikey Bussing DC Jr., et al., 2013) is: 21.8%   Values used to calculate the score:     Age: 65 years     Sex: Male     Is Non-Hispanic African American: Yes     Diabetic: Yes     Tobacco smoker: No     Systolic Blood Pressure: 700 mmHg     Is BP treated: Yes     HDL Cholesterol: 79 MG/DL     Total Cholesterol:  201 MG/DL  A/P: Hypertension longstanding currently controlled on current medications. BP Goal = <130/80 mmHg. Medication adherence reported.  -Continued Benicar 20mg  once daily.  -F/u labs ordered - none -Counseled on lifestyle modifications for blood pressure control including reduced dietary sodium, increased exercise, adequate sleep.  Results reviewed and written information provided. Total time in face-to-face counseling 30 minutes.   F/U Clinic Visit in 1 month.    Benard Halsted, PharmD, [place], [place] [phone]

## 2021-01-21 ENCOUNTER — Ambulatory Visit: Payer: 59 | Admitting: Urology

## 2021-01-28 ENCOUNTER — Other Ambulatory Visit: Payer: Self-pay

## 2021-02-05 ENCOUNTER — Other Ambulatory Visit: Payer: Self-pay

## 2021-02-05 ENCOUNTER — Encounter: Payer: Self-pay | Admitting: Urology

## 2021-02-05 ENCOUNTER — Ambulatory Visit (INDEPENDENT_AMBULATORY_CARE_PROVIDER_SITE_OTHER): Payer: Self-pay | Admitting: Urology

## 2021-02-05 VITALS — BP 113/71 | HR 86 | Ht 71.0 in | Wt 297.0 lb

## 2021-02-05 DIAGNOSIS — N529 Male erectile dysfunction, unspecified: Secondary | ICD-10-CM

## 2021-02-05 DIAGNOSIS — N3281 Overactive bladder: Secondary | ICD-10-CM

## 2021-02-05 DIAGNOSIS — R972 Elevated prostate specific antigen [PSA]: Secondary | ICD-10-CM

## 2021-02-05 MED ORDER — SILDENAFIL CITRATE 50 MG PO TABS: 50.0000 mg | ORAL_TABLET | Freq: Every day | ORAL | 11 refills | Status: DC | PRN

## 2021-02-05 NOTE — Patient Instructions (Addendum)
Prostate MRI Prep:  1- No ejaculation 48 hours prior to exam  2- No food or drink or caffeine 4 hours prior to exam  3- Fleets enema needs to be done 4 hours prior to exam   4- Urinate just prior to exam   Prostate Cancer Screening  Prostate cancer screening is a test that is done to check for the presence of prostate cancer in men. The prostate gland is a walnut-sized gland that is located below the bladder and in front of the rectum in males. The function of the prostate is to add fluid to semen during ejaculation. Prostate cancer isthe second most common type of cancer in men. Who should have prostate cancer screening?  Screening recommendations vary based on age and other risk factors. Screening is recommended if: You are older than age 46. If you are age 24-69, talk with your health care provider about your need for screening and how often screening should be done. Because most prostate cancers are slow growing and will not cause death, screening is generally reserved in this age group for men who have a 10-15-year life expectancy. You are younger than age 75, and you have these risk factors: Being a black male or a male of African descent. Having a father, brother, or uncle who has been diagnosed with prostate cancer. The risk is higher if your family member's cancer occurred at an early age. Screening is not recommended if: You are younger than age 24. You are between the ages of 25 and 44 and you have no risk factors. You are 72 years of age or older. At this age, the risks that screening can cause are greater than the benefits that it may provide. If you are at high risk for prostate cancer, your health care provider may recommend that you have screenings more often or that you start screening at ayounger age. How is screening for prostate cancer done? The recommended prostate cancer screening test is a blood test called the prostate-specific antigen (PSA) test. PSA is a protein  that is made in the prostate. As you age, your prostate naturally produces more PSA. Abnormally high PSA levels may be caused by: Prostate cancer. An enlarged prostate that is not caused by cancer (benign prostatic hyperplasia, BPH). This condition is very common in older men. A prostate gland infection (prostatitis). Depending on the PSA results, you may need more tests, such as: A physical exam to check the size of your prostate gland. Blood and imaging tests. A procedure to remove tissue samples from your prostate gland for testing (biopsy). What are the benefits of prostate cancer screening? Screening can help to identify cancer at an early stage, before symptoms start and when the cancer can be treated more easily. There is a small chance that screening may lower your risk of dying from prostate cancer. The chance is small because prostate cancer is a slow-growing cancer, and most men with prostate cancer die from a different cause. What are the risks of prostate cancer screening? The main risk of prostate cancer screening is diagnosing and treating prostate cancer that would never have caused any symptoms or problems. This is called overdiagnosisand overtreatment. PSA screening cannot tell you if your PSA is high due to cancer or a different cause. A prostate biopsy is the only procedure to diagnose prostate cancer. Even the results of a biopsy may not tell you if your cancer needs to be treated. Slow-growing prostate cancer may not need any treatment other  than monitoring, so diagnosing and treating it may causeunnecessary stress or other side effects. A prostate biopsy may also cause: Infection or fever. A false negative. This is a result that shows that you do not have prostate cancer when you actually do have prostate cancer. Questions to ask your health care provider When should I start prostate cancer screening? What is my risk for prostate cancer? How often do I need screening? What  type of screening tests do I need? How do I get my test results? What do my results mean? Do I need treatment? Where to find more information The American Cancer Society: www.cancer.org American Urological Association: www.auanet.org Contact a health care provider if: You have difficulty urinating. You have pain when you urinate or ejaculate. You have blood in your urine or semen. You have pain in your back or in the area of your prostate. Summary Prostate cancer is a common type of cancer in men. The prostate gland is located below the bladder and in front of the rectum. This gland adds fluid to semen during ejaculation. Prostate cancer screening may identify cancer at an early stage, when the cancer can be treated more easily. The prostate-specific antigen (PSA) test is the recommended screening test for prostate cancer. Discuss the risks and benefits of prostate cancer screening with your health care provider. If you are age 93 or older, the risks that screening can cause are greater than the benefits that it may provide. This information is not intended to replace advice given to you by your health care provider. Make sure you discuss any questions you have with your healthcare provider. Document Revised: 06/19/2020 Document Reviewed: 02/01/2019 Elsevier Patient Education  [place].

## 2021-02-05 NOTE — Progress Notes (Signed)
02/05/21 9:41 AM   Nancy Marus Sr. 1956/01/12 [id number]  CC: Elevated PSA, urinary symptoms, or erectile dysfunction  HPI: I saw Mr. Mccaughan for the above issues today.  He is a very comorbid 65 year old African-American male with history notable for morbid obesity and BMI of 41, diabetes, hypertension, and long history of elevated PSA.  PSA was elevated at 9 in September 2016, and 9.6 in June 2017.  I do not see that he ever saw a urologist for this, and he denies any history of prostate biopsy or prostate imaging.  Most recent PSA in January 2022 continued to rise at 17.6.  He denies any known family history of prostate or breast cancer.  He has a long history of urinary symptoms primarily of urgency and urge incontinence.  He does not wear a pad or depends.  He drinks primarily diet sodas, energy drinks, and alcohol.  When he drinks alcohol he will have 2-3 40 ounce beers.  He also has problems with erectile dysfunction.  He thinks he is tried a medication before, unsure which PDE 5 inhibitor this was, but it did initially help with the erections.  He does not take any nitrates for chest pain.   PMH: Past Medical History:  Diagnosis Date   Allergy    Foot pain, bilateral    referred to podiatry 06/2014   GERD (gastroesophageal reflux disease)    long history of   Hyperlipidemia    Hypertension    Polyarthralgia    shoulders, knees, ankles, wrists; neg rheum lab screen 04/2014   Type 2 diabetes mellitus with diabetic neuropathy ([place])    Wears glasses      Family History: Family History  Problem Relation Age of Onset   Diabetes Mother    Hypertension Mother    Other Father        murdered   Diabetes Sister    Arthritis Sister    Heart disease Sister    Diabetes Sister    Diabetes Sister    Cancer Neg Hx    Stroke Neg Hx    Prostate cancer Neg Hx        unknown   Bladder Cancer Neg Hx        unkown   Kidney cancer Neg Hx        unkown    Social History:   reports that he has quit smoking. His smoking use included cigarettes. He has never used smokeless tobacco. He reports current alcohol use. He reports that he does not use drugs.  Physical Exam: BP 113/71   Pulse 86   Ht '5\' 11"'$  (1.803 m)   Wt 297 lb (134.7 kg)   BMI 41.42 kg/m    Constitutional:  Alert and oriented, No acute distress. Cardiovascular: No clubbing, cyanosis, or edema. Respiratory: Normal respiratory effort, no increased work of breathing. GI: Abdomen is soft, nontender, nondistended, no abdominal masses GU: Limited secondary to morbid obesity, prostate 50 g, smooth without masses or nodules  Laboratory Data: Reviewed, see HPI  Assessment & Plan:   Extremely comorbid 65 year old male with morbid obesity and BMI of 41, diabetes, hypertension who has a long history of elevated PSA since at least 2016.  PSA was 9 in 2016, and most recently 17.6 in January 2022.  We reviewed the implications of an elevated PSA and the uncertainty surrounding it. In general, a man's PSA increases with age and is produced by both normal and cancerous prostate tissue. The differential diagnosis for  elevated PSA includes BPH, prostate cancer, infection, recent intercourse/ejaculation, recent urethroscopic manipulation (foley placement/cystoscopy) or trauma, and prostatitis.  We discussed alternatives for elevated PSA like a prostate MRI.  He is very resistant to prostate biopsy at this time, but is amenable to a prostate MRI, and we will call with those results.  Management of an elevated PSA can include observation or prostate biopsy and we discussed this in detail. Our goal is to detect clinically significant prostate cancers, and manage with either active surveillance, surgery, or radiation for localized disease. Risks of prostate biopsy include bleeding, infection (including life threatening sepsis), pain, and lower urinary symptoms. Hematuria, hematospermia, and blood in the stool are all common  after biopsy and can persist up to 4 weeks.    He also has overactive bladder symptoms of urgency, frequency, and urge incontinence likely secondary to his high intake of energy drinks, diet sodas, and alcohol, as well as his morbid obesity.  Behavioral strategies discussed at length, and I recommended starting with this prior to any medications.  In terms of his erectile dysfunction, I recommended a trial of sildenafil 50 to 100 mg on demand.  Risks and benefits were discussed.  Prostate MRI for elevated PSA, call with results.  I do not think he would tolerate a biopsy without some sedation based on his DRE, and if suspicious finding on MRI will refer to [facility] urology in [place] for MRI fusion biopsy likely with nitrous.  Behavioral strategies discussed at length regarding OAB symptoms  Trial of sildenafil 50 to 100 mg on demand for ED  I spent 65 total minutes on the day of the encounter including pre-visit review of the medical record, face-to-face time with the patient, and post visit ordering of labs/imaging/tests.  Nickolas Madrid, MD 02/05/2021  [facility] [address] [place], [place] [address] [phone]

## 2021-02-11 ENCOUNTER — Ambulatory Visit: Payer: 59 | Admitting: Dietician

## 2021-02-16 ENCOUNTER — Other Ambulatory Visit: Payer: Self-pay | Admitting: Internal Medicine

## 2021-02-16 DIAGNOSIS — Z79891 Long term (current) use of opiate analgesic: Secondary | ICD-10-CM

## 2021-03-02 ENCOUNTER — Other Ambulatory Visit: Payer: Self-pay | Admitting: Internal Medicine

## 2021-03-02 DIAGNOSIS — E1142 Type 2 diabetes mellitus with diabetic polyneuropathy: Secondary | ICD-10-CM

## 2021-03-04 ENCOUNTER — Ambulatory Visit (HOSPITAL_COMMUNITY): Payer: 59

## 2021-03-13 ENCOUNTER — Other Ambulatory Visit: Payer: Self-pay

## 2021-03-13 ENCOUNTER — Ambulatory Visit
Admission: RE | Admit: 2021-03-13 | Discharge: 2021-03-13 | Disposition: A | Discharge status: Home / Self Care | Payer: 59 | Source: Ambulatory Visit | Attending: Urology | Admitting: Urology

## 2021-03-13 DIAGNOSIS — R972 Elevated prostate specific antigen [PSA]: Secondary | ICD-10-CM | POA: Insufficient documentation

## 2021-03-13 MED ORDER — GADOBUTROL 1 MMOL/ML IV SOLN
10.0000 mL | Freq: Once | INTRAVENOUS | Status: AC | PRN
  Administered 2021-03-13: 10 mL via INTRAVENOUS

## 2021-03-16 ENCOUNTER — Other Ambulatory Visit: Payer: Self-pay

## 2021-03-16 ENCOUNTER — Telehealth: Payer: Self-pay

## 2021-03-16 ENCOUNTER — Ambulatory Visit: Payer: 59 | Attending: Internal Medicine | Admitting: Internal Medicine

## 2021-03-16 DIAGNOSIS — E1129 Type 2 diabetes mellitus with other diabetic kidney complication: Secondary | ICD-10-CM

## 2021-03-16 DIAGNOSIS — M159 Polyosteoarthritis, unspecified: Secondary | ICD-10-CM

## 2021-03-16 DIAGNOSIS — E785 Hyperlipidemia, unspecified: Secondary | ICD-10-CM

## 2021-03-16 DIAGNOSIS — E1142 Type 2 diabetes mellitus with diabetic polyneuropathy: Secondary | ICD-10-CM | POA: Diagnosis not present

## 2021-03-16 DIAGNOSIS — R972 Elevated prostate specific antigen [PSA]: Secondary | ICD-10-CM

## 2021-03-16 DIAGNOSIS — E1169 Type 2 diabetes mellitus with other specified complication: Secondary | ICD-10-CM | POA: Diagnosis not present

## 2021-03-16 DIAGNOSIS — I152 Hypertension secondary to endocrine disorders: Secondary | ICD-10-CM

## 2021-03-16 DIAGNOSIS — Z79891 Long term (current) use of opiate analgesic: Secondary | ICD-10-CM

## 2021-03-16 DIAGNOSIS — E1159 Type 2 diabetes mellitus with other circulatory complications: Secondary | ICD-10-CM | POA: Diagnosis not present

## 2021-03-16 DIAGNOSIS — Z1211 Encounter for screening for malignant neoplasm of colon: Secondary | ICD-10-CM

## 2021-03-16 DIAGNOSIS — R809 Proteinuria, unspecified: Secondary | ICD-10-CM

## 2021-03-16 DIAGNOSIS — Z23 Encounter for immunization: Secondary | ICD-10-CM

## 2021-03-16 MED ORDER — METFORMIN HCL ER 500 MG PO TB24: 500.0000 mg | ORAL_TABLET | Freq: Two times a day (BID) | ORAL | 1 refills | Status: DC

## 2021-03-16 MED ORDER — TRAMADOL HCL 50 MG PO TABS: 50.0000 mg | ORAL_TABLET | Freq: Three times a day (TID) | ORAL | 0 refills | Status: DC | PRN

## 2021-03-16 NOTE — Telephone Encounter (Signed)
Pt has been scheduled and reminder has been mailed.  

## 2021-03-16 NOTE — Progress Notes (Signed)
Patient ID: Matthew Marus Sr., male   DOB: 03-05-1956, 65 y.o.   MRN: [id number] Virtual Visit via Telephone Note  I connected with Matthew Marus Sr. on 03/16/2021 at 8:37 AM by telephone and verified that I am speaking with the correct person using two identifiers  Location: Patient: home Provider: office  Participants: Myself Patient   I discussed the limitations, risks, security and privacy concerns of performing an evaluation and management service by telephone and the availability of in person appointments. I also discussed with the patient that there may be a patient responsible charge related to this service. The patient expressed understanding and agreed to proceed.   History of Present Illness: Patient with history of HTN, DM peripheral neuropathy, HL, GERD, OA BL knees and LT shoulder, elevated PSA, ED, BPH, vitamin D deficiency,former smoker.  Last seen 11/2020. Today's visit is for chronic ds management.  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  He is prescribed tramadol to take twice a day.  On some days he states he has to take it 3 times a day and wanting to know if the frequency can be increased.  He is tolerating the tramadol well without significant side effects.  Elev PSA:  saw urologist Dr. Diamantina Providence.  Pt was reluctant to have bx bt agreed to MRI of prostate.  He had this done on the ninth of this month.  The urologist has not called him yet with the results.  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  However he will discuss further with the urologist.  DIABETES TYPE 2 Last A1C:    Med Adherence:  [x]  Yes -metformin. Taking one a day.  Reports he was previously on BID and feels he needs to be back on twice a day dosing.   Medication side effects:  []  Yes    [x]  No Home Monitoring?  [x]   Yes  2 x/day Home glucose results range: before meals range 129-160; after meals 140-180.  Concern that BS increases after he eats Diet Adherence: [x]  Yes -most of the time he tries to eat right.    Exercise: [x]  Yes -goes to gym 2-3/wk to walk on treadmill   []  No Hypoglycemic episodes?: []  Yes    []  No Numbness of the feet? [x]  Yes - sometimes in LT foot    []  No Retinopathy hx? []  Yes    []  No Last eye exam: due for eye exam.  Has cataracts Comments: Patient has mild microalbuminurea of 66.  HYPERTENSION Currently taking: see medication list Med Adherence: [x]  Yes.  He is on Benicar.    []  No Medication side effects: []  Yes    [x]  No Adherence with salt restriction: [x]  Yes    []  No Home Monitoring?: []  Yes    [x]  No Monitoring Frequency:  Home BP results range:  SOB? []  Yes    [x]  No Chest Pain?: []  Yes    [x]  No Leg swelling?: []  Yes    [x]  No Headaches?: []  Yes    [x]  No Dizziness? []  Yes    [x]  No Comments:   HL: compliant with Lipitor.  LDL in January of this year was 75.  HM:  due for flu vaccine and shingles shot.  Did not have c-scope. Reports the  office where he was schedule does not take his insurance  Outpatient Encounter Medications as of 03/16/2021  Medication Sig   aspirin EC 81 MG tablet Take 1 tablet (81 mg total) by mouth daily.   atorvastatin (LIPITOR) 80 MG tablet Take 1 tablet (80 mg total) by mouth daily.   COVID-19 mRNA Vac-TriS, Pfizer, SUSP injection Inject into the muscle.   fluticasone (FLONASE) 50 MCG/ACT nasal spray Place 1 spray into both nostrils daily.   gabapentin (NEURONTIN) 600 MG tablet Take 1 tablet (600 mg total) by mouth 3 (three) times daily.   metFORMIN (GLUCOPHAGE-XR) 500 MG 24 hr tablet Take 1 tablet by mouth once daily with breakfast   olmesartan (BENICAR) 20 MG tablet Take 1 tablet (20 mg total) by mouth daily.   pantoprazole (PROTONIX) 20 MG tablet Take 1 tablet (20 mg total) by mouth daily.   sildenafil (VIAGRA) 50 MG tablet Take  1-2 tablets (50-100 mg total) by mouth daily as needed for erectile dysfunction.   traMADol (ULTRAM) 50 MG tablet TAKE 1 TABLET BY MOUTH EVERY 12 HOURS AS NEEDED   No facility-administered encounter medications on file as of 03/16/2021.      Observations/Objective: Results for orders placed or performed in visit on 12/16/20  CMP14+EGFR  Result Value Ref Range   Glucose 86 65 - 99 mg/dL   BUN 11 8 - 27 mg/dL   Creatinine, Ser 1.19 0.76 - 1.27 mg/dL   eGFR 69 >59 mL/min/1.73   BUN/Creatinine Ratio 9 (L) 10 - 24   Sodium 141 134 - 144 mmol/L   Potassium 4.4 3.5 - 5.2 mmol/L   Chloride 103 96 - 106 mmol/L   CO2 21 20 - 29 mmol/L   Calcium 9.8 8.6 - 10.2 mg/dL   Total Protein 6.8 6.0 - 8.5 g/dL   Albumin 4.3 3.8 - 4.8 g/dL   Globulin, Total 2.5 1.5 - 4.5 g/dL   Albumin/Globulin Ratio 1.7 1.2 - 2.2   Bilirubin Total 0.4 0.0 - 1.2 mg/dL   Alkaline Phosphatase 93 44 - 121 IU/L   AST 17 0 - 40 IU/L   ALT 7 0 - 44 IU/L     Assessment and Plan: 1. Type 2 diabetes mellitus with peripheral neuropathy (HCC) I went over blood sugar goals with the patient.  Goal for blood sugars before meals is between 90-130 and 2 hours after meal to be less than 180.  We will increase the metformin to twice a day.  Continue healthy eating habits and try to move as much as he can. - metFORMIN (GLUCOPHAGE-XR) 500 MG 24 hr tablet; Take 1 tablet (500 mg total) by mouth 2 (two) times daily.  Dispense: 180 tablet; Refill: 1 - Ambulatory referral to Ophthalmology - Hemoglobin A1c; Future  2. Hypertension associated with diabetes ([place]) Continue Benicar.  Continue low-salt diet.  3. Microalbuminuria due to type 2 diabetes mellitus ([place]) Continue Benicar.  Stressed the importance of good diabetes control.  4. Hyperlipidemia associated with type 2 diabetes mellitus ([place]) Continue atorvastatin.  5. Elevated PSA He will discuss with urologist his options given the results of the MRI.  I have encouraged him to  consider get the biopsy done.  6. Screening for colon cancer - Ambulatory referral to Gastroenterology  7. Need for influenza vaccination Patient will come to the lab to have blood test done tomorrow.  At that time we will have the clinical pharmacist give him his flu shot.  8. Polyarticular osteoarthritis We discussed increasing the tramadol.  Instead of 60 a month, I will give 34 a month so that he has some extras for days when he needs to take 3 times a day.  Patient is agreeable to this.  He is up-to-date with urine drug screen, controlled substance prescribing agreement.  [place] controlled substance reporting system reviewed.   Follow Up Instructions: 4 mths Patient will come to the laboratory tomorrow.  We will arrange to give him the flu vaccine at the same time.   I discussed the assessment and treatment plan with the patient. The patient was provided an opportunity to ask questions and all were answered. The patient agreed with the plan and demonstrated an understanding of the instructions.   The patient was advised to call back or seek an in-person evaluation if the symptoms worsen or if the condition fails to improve as anticipated.  I  Spent 17 minutes on this telephone encounter  Karle Plumber, MD

## 2021-03-16 NOTE — Telephone Encounter (Signed)
-----   Message from Ladell Pier, MD sent at 03/16/2021 10:35 AM EDT ----- Give f/u appt in 4 mths

## 2021-03-17 ENCOUNTER — Ambulatory Visit: Payer: 59 | Admitting: Pharmacist

## 2021-03-19 ENCOUNTER — Telehealth: Payer: Self-pay

## 2021-03-19 DIAGNOSIS — R972 Elevated prostate specific antigen [PSA]: Secondary | ICD-10-CM

## 2021-03-19 DIAGNOSIS — R9389 Abnormal findings on diagnostic imaging of other specified body structures: Secondary | ICD-10-CM

## 2021-03-19 NOTE — Telephone Encounter (Signed)
-----   Message from Billey Co, MD sent at 03/19/2021  8:59 AM EDT ----- There were some suspicious areas on prostate MRI, and I would recommend fusion biopsy at [facility] urology in [place].  They also will be able to give him nitrous so he will be sedated for the procedure and it will be much more tolerable.  Follow-up with me to discuss results of biopsy.  Nickolas Madrid, MD 03/19/2021

## 2021-03-19 NOTE — Telephone Encounter (Signed)
Called pt no answer. LM for pt to call back. 1st attempt  

## 2021-03-20 NOTE — Telephone Encounter (Signed)
Called pt informed him of the information below. Pt voiced understanding. Referral placed.

## 2021-03-24 ENCOUNTER — Ambulatory Visit: Payer: 59 | Admitting: Pharmacist

## 2021-03-30 ENCOUNTER — Ambulatory Visit: Payer: 59 | Admitting: Pharmacist

## 2021-03-30 NOTE — Progress Notes (Unsigned)
   S:    Patient arrives in good spirits. Presents to the clinic for hypertension evaluation, counseling, and management. Patient was referred by and last seen by Primary Care Provider on 03/16/2021 where Dr. Colin Mulders  Medication adherence reported. Patient reports no HA, dizziness, chest pain or dyspnea.  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  Home BP readings: none  Last 3 Office BP readings: BP Readings from Last 3 Encounters:  02/05/21 113/71  01/15/21 122/68  12/16/20 121/80    BMET    Component Value Date/Time   NA 141 12/16/2020 0913   K 4.4 12/16/2020 0913   CL 103 12/16/2020 0913   CO2 21 12/16/2020 0913   GLUCOSE 86 12/16/2020 0913   GLUCOSE 102 (H) 05/06/2018 0805   BUN 11 12/16/2020 0913   CREATININE 1.19 12/16/2020 0913   CREATININE 1.18 12/05/2015 0841   CALCIUM 9.8 12/16/2020 0913   GFRNONAA >60 05/06/2018 0805   GFRAA >60 05/06/2018 0805    Renal function: CrCl cannot be calculated (Patient's most recent lab result is older than the maximum 21 days allowed.).  Clinical ASCVD: No  The 10-year ASCVD risk score (Arnett DK, et al., 2019) is: 19.8%   Values used to calculate the score:     Age: 49 years     Sex: Male     Is Non-Hispanic African American: Yes     Diabetic: Yes     Tobacco smoker: No     Systolic Blood Pressure: 161 mmHg     Is BP treated: Yes     HDL Cholesterol: 79 MG/DL     Total Cholesterol: 201 MG/DL  A/P: Hypertension  longstanding currently controlled on current medications. BP Goal = <130/80 mmHg. Medication adherence reported.  -Continued Benicar 20mg  once daily.  -F/u labs ordered - none -Counseled on lifestyle modifications for blood pressure control including reduced dietary sodium, increased exercise, adequate sleep.  Results reviewed and written information provided. Total time in face-to-face counseling 30 minutes.   F/U Clinic Visit in 1 month.    Benard Halsted, PharmD, [place], [place] [phone]

## 2021-04-01 ENCOUNTER — Other Ambulatory Visit: Payer: Self-pay | Admitting: Urology

## 2021-04-01 MED ORDER — DIAZEPAM 10 MG PO TABS
10.0000 mg | ORAL_TABLET | Freq: Once | ORAL | 0 refills | Status: DC | PRN
Start: 2021-04-01 — End: 2022-02-25

## 2021-04-02 ENCOUNTER — Telehealth: Payer: Self-pay

## 2021-04-02 NOTE — Telephone Encounter (Signed)
Called pt no answer, VM full unable to leave message. 1st attempt.

## 2021-04-02 NOTE — Telephone Encounter (Signed)
-----   Message from Billey Co, MD sent at 04/01/2021 10:40 AM EDT ----- Regarding: FW: referral We will have to do a prostate biopsy here in [place].  I will send 10 mg of Valium to his pharmacy to take prior, please review biopsy instructions, and schedule prostate biopsy  Nickolas Madrid, MD 04/01/2021   ----- Message ----- From: Benard Halsted Sent: 03/30/2021   2:10 PM EDT To: Billey Co, MD Subject: referral                                       You referred this patient to [facility] for a fusion BX but he can't go there due to his insurance. That's why he came here, We are the only place around that takes it. [facility], [place], [place], [facility] no where takes it but [place]. I spoke with him and he knew that already. [facility] referred him to [place]. Please let me know what you want to do. They will not see him at all.  Thanks, Sharyn Lull

## 2021-04-03 NOTE — Telephone Encounter (Signed)
Called pt's mobile number, no answer. Unable to leave message as VM is full.   Called pt's work number, no answer as they are currently closed.   2nd and 3rd attempts.

## 2021-04-03 NOTE — Telephone Encounter (Signed)
Pt calls office back, informed him of the information below. Appointment scheduled. Instructions reviewed and mailed.

## 2021-04-13 ENCOUNTER — Other Ambulatory Visit: Payer: Self-pay

## 2021-04-13 ENCOUNTER — Telehealth: Payer: Self-pay

## 2021-04-13 NOTE — Telephone Encounter (Signed)
Pt calls and questions what medications he needs to discontinue prior to biopsy this week. Advised pt that he should only have d/c ASA 81mg . Pt states that he has not taken ASA in over a year. Advised pt to continue all other medications, reviewed fleets enema and all other instructions as pt states he has not been to his PO box to pick up mailed instructions.

## 2021-04-14 ENCOUNTER — Ambulatory Visit: Payer: 59 | Admitting: Dietician

## 2021-04-16 ENCOUNTER — Other Ambulatory Visit: Payer: 59 | Admitting: Urology

## 2021-04-18 ENCOUNTER — Other Ambulatory Visit: Payer: Self-pay | Admitting: Internal Medicine

## 2021-04-18 DIAGNOSIS — M159 Polyosteoarthritis, unspecified: Secondary | ICD-10-CM

## 2021-04-18 NOTE — Telephone Encounter (Signed)
Requested medication (s) are due for refill today: yes  Requested medication (s) are on the active medication list: yes  Last refill:  03/16/21 # 70  Future visit scheduled: yes  Notes to clinic:  med not delegated to NT to RF   Requested Prescriptions  Pending Prescriptions Disp Refills   traMADol (ULTRAM) 50 MG tablet [Pharmacy Med Name: traMADol HCl 50 MG Oral Tablet] 70 tablet 0    Sig: TAKE 1 TABLET BY MOUTH EVERY 8 HOURS AS NEEDED **EACH  PRESCRIPTION  TO  LAST  1  MONTH**     Not Delegated - Analgesics:  Opioid Agonists Failed - 04/18/2021  9:21 AM      Failed - This refill cannot be delegated      Passed - Urine Drug Screen completed in last 360 days      Passed - Valid encounter within last 6 months    Recent Outpatient Visits           1 month ago Type 2 diabetes mellitus with peripheral neuropathy ([place])   [place] Karle Plumber B, MD   3 months ago Hypertension associated with diabetes [facility])   [place], Annie Main L, RPH-CPP   4 months ago Hypertension associated with diabetes [facility])   [place], [name], RPH-CPP   5 months ago Encounter to establish care   [place], Deborah B, MD   2 years ago Type 2 diabetes mellitus with diabetic mononeuropathy, without long-term current use of insulin ([place])   [place], Roger David, PA-C       Future Appointments             In 3 days Daisy Blossom, [name], [place]   In 3 months Wynetta Emery, [name], MD [place]

## 2021-04-21 ENCOUNTER — Other Ambulatory Visit: Payer: Self-pay

## 2021-04-21 ENCOUNTER — Ambulatory Visit: Payer: 59 | Attending: Internal Medicine | Admitting: Pharmacist

## 2021-04-21 DIAGNOSIS — Z23 Encounter for immunization: Secondary | ICD-10-CM | POA: Diagnosis not present

## 2021-04-21 DIAGNOSIS — E1142 Type 2 diabetes mellitus with diabetic polyneuropathy: Secondary | ICD-10-CM

## 2021-04-21 NOTE — Progress Notes (Signed)
Patient presents for vaccination against influenza per orders of Dr. Wynetta Emery. Consent given. Counseling provided. No contraindications exists. Vaccine administered without incident.   Benard Halsted, PharmD, [place], [place] [phone]

## 2021-04-22 ENCOUNTER — Telehealth: Payer: Self-pay

## 2021-04-22 LAB — HEMOGLOBIN A1C
Est. average glucose Bld gHb Est-mCnc: 143 mg/dL
Hgb A1c MFr Bld: 6.6 % — ABNORMAL HIGH (ref 4.8–5.6)

## 2021-04-22 NOTE — Telephone Encounter (Signed)
Contacted pt to go over lab results pt didn't answer lvm   Sent a CRM and forward labs to NT to give pt labs when they call back   

## 2021-04-30 ENCOUNTER — Ambulatory Visit: Payer: 59 | Admitting: Urology

## 2021-05-06 ENCOUNTER — Other Ambulatory Visit: Payer: 59 | Admitting: Urology

## 2021-05-20 ENCOUNTER — Other Ambulatory Visit: Payer: 59 | Admitting: Urology

## 2021-05-20 ENCOUNTER — Ambulatory Visit: Payer: 59 | Admitting: Urology

## 2021-06-02 ENCOUNTER — Encounter: Payer: 59 | Attending: Internal Medicine | Admitting: Dietician

## 2021-06-03 ENCOUNTER — Ambulatory Visit: Payer: 59 | Admitting: Urology

## 2021-06-04 ENCOUNTER — Other Ambulatory Visit: Payer: 59 | Admitting: Urology

## 2021-06-04 ENCOUNTER — Encounter: Payer: Self-pay | Admitting: Urology

## 2021-06-11 ENCOUNTER — Encounter: Payer: Self-pay | Admitting: Gastroenterology

## 2021-06-18 ENCOUNTER — Ambulatory Visit: Payer: 59 | Admitting: Urology

## 2021-06-18 ENCOUNTER — Other Ambulatory Visit: Payer: Self-pay | Admitting: Internal Medicine

## 2021-06-18 ENCOUNTER — Other Ambulatory Visit: Payer: 59 | Admitting: Urology

## 2021-06-18 DIAGNOSIS — M159 Polyosteoarthritis, unspecified: Secondary | ICD-10-CM

## 2021-06-18 NOTE — Telephone Encounter (Signed)
Will forward to provider  

## 2021-06-18 NOTE — Telephone Encounter (Signed)
Copied from [place] [id number]. Topic: Quick Communication - Rx Refill/Question >> Jun 18, 2021  9:16 AM Leward Quan A wrote: Medication: traMADol (ULTRAM) 50 MG tablet  Has the patient contacted their pharmacy? No. Asked to contact the pharmacy  (Agent: If no, request that the patient contact the pharmacy for the refill. If patient does not wish to contact the pharmacy document the reason why and proceed with request.) (Agent: If yes, when and what did the pharmacy advise?)  Preferred Pharmacy (with phone number or street name): [place], [place]  Phone:  [phone] Fax:  [phone]    Has the patient been seen for an appointment in the last year OR does the patient have an upcoming appointment? Yes.    Agent: Please be advised that RX refills may take up to 3 business days. We ask that you follow-up with your pharmacy.

## 2021-06-19 NOTE — Telephone Encounter (Signed)
° °  Notes to clinic:  pharmacy requesting discrete sig details to calculate max MME. Please assess.   Requested Prescriptions  Pending Prescriptions Disp Refills   traMADol (ULTRAM) 50 MG tablet 70 tablet 1     Not Delegated - Analgesics:  Opioid Agonists Failed - 06/18/2021 11:35 AM      Failed - This refill cannot be delegated      Passed - Urine Drug Screen completed in last 360 days      Passed - Valid encounter within last 6 months    Recent Outpatient Visits           1 month ago Need for influenza vaccination   [place], RPH-CPP   3 months ago Type 2 diabetes mellitus with peripheral neuropathy [facility])   [place], MD   5 months ago Hypertension associated with diabetes [facility])   [place], [name], RPH-CPP   6 months ago Hypertension associated with diabetes [facility])   [place], [name], RPH-CPP   7 months ago Encounter to establish care   [place], MD       Future Appointments             In 1 month Wynetta Emery, [name], MD [place]

## 2021-06-19 NOTE — Telephone Encounter (Signed)
Patient called in states is completley out of tramadol. Can short supply be sent to [facility] until refill comes in ?  [place], [place] Phone:  [phone]  Fax:  [phone]

## 2021-06-19 NOTE — Telephone Encounter (Signed)
Requested medication (s) are due for refill today: no requesting refill for enough until refills come in that are due today   Requested medication (s) are on the active medication list: yes   Last refill:  04/20/21 #70 1 refills  Future visit scheduled: yes in 1 month  Notes to clinic:  not delegated per protocol. Refills due to come in today . Called patient to confirm and refills have not been received at this time. Patient concerned he is completely out of medication . Instructed patient to contact clinic if refills received.      Requested Prescriptions  Pending Prescriptions Disp Refills   traMADol (ULTRAM) 50 MG tablet [Pharmacy Med Name: traMADol HCl 50 MG Oral Tablet] 70 tablet 0    Sig: TAKE 1 TABLET BY MOUTH EVERY 8 HOURS AS NEEDED --EACH  PRESCRIPTION  TO  LAST  1  MONTH     Not Delegated - Analgesics:  Opioid Agonists Failed - 06/19/2021  2:27 PM      Failed - This refill cannot be delegated      Passed - Urine Drug Screen completed in last 360 days      Passed - Valid encounter within last 6 months    Recent Outpatient Visits           1 month ago Need for influenza vaccination   [place], [name], RPH-CPP   3 months ago Type 2 diabetes mellitus with peripheral neuropathy [name])   [place], MD   5 months ago Hypertension associated with diabetes [name])   [place], [name], RPH-CPP   6 months ago Hypertension associated with diabetes [name])   [place], [name], RPH-CPP   7 months ago Encounter to establish care   [place], MD       Future Appointments             In 1 month Wynetta Emery, [name], MD [name]

## 2021-06-19 NOTE — Telephone Encounter (Signed)
Called patient to confirm he has not received medication refill at this time. Patient reports refill should come in today but has not received medications at this time. Patient is out of medication and requesting refill of tramadol.

## 2021-06-23 ENCOUNTER — Ambulatory Visit (INDEPENDENT_AMBULATORY_CARE_PROVIDER_SITE_OTHER): Payer: Self-pay | Admitting: Podiatry

## 2021-06-23 DIAGNOSIS — Z91199 Patient's noncompliance with other medical treatment and regimen due to unspecified reason: Secondary | ICD-10-CM

## 2021-06-23 NOTE — Progress Notes (Signed)
Patient was no-show for appointment today 

## 2021-06-24 ENCOUNTER — Ambulatory Visit: Payer: 59 | Admitting: Gastroenterology

## 2021-07-01 ENCOUNTER — Ambulatory Visit: Payer: 59 | Admitting: Urology

## 2021-07-03 ENCOUNTER — Other Ambulatory Visit: Payer: Self-pay

## 2021-07-03 ENCOUNTER — Encounter: Payer: Self-pay | Admitting: Urology

## 2021-07-03 ENCOUNTER — Ambulatory Visit (INDEPENDENT_AMBULATORY_CARE_PROVIDER_SITE_OTHER): Payer: 59 | Admitting: Urology

## 2021-07-03 VITALS — BP 162/86 | HR 82 | Ht 71.0 in | Wt 299.0 lb

## 2021-07-03 DIAGNOSIS — R972 Elevated prostate specific antigen [PSA]: Secondary | ICD-10-CM

## 2021-07-03 DIAGNOSIS — C61 Malignant neoplasm of prostate: Secondary | ICD-10-CM | POA: Diagnosis not present

## 2021-07-03 DIAGNOSIS — R9389 Abnormal findings on diagnostic imaging of other specified body structures: Secondary | ICD-10-CM

## 2021-07-03 MED ORDER — GENTAMICIN SULFATE 40 MG/ML IJ SOLN
80.0000 mg | Freq: Once | INTRAMUSCULAR | Status: AC
  Administered 2021-07-03: 10:00:00 80 mg via INTRAMUSCULAR

## 2021-07-03 MED ORDER — LEVOFLOXACIN 500 MG PO TABS
500.0000 mg | ORAL_TABLET | Freq: Once | ORAL | Status: AC
  Administered 2021-07-03: 10:00:00 500 mg via ORAL

## 2021-07-03 NOTE — Progress Notes (Signed)
° °  07/03/21  Indication: Elevated PSA 17.6.  Prostate MRI in September 2022 showed diffuse abnormal signal throughout the peripheral zone, overall PI-RADS category 3 with concern for diffuse prostate neoplasm.  He has had numerous no-shows for previously scheduled prostate biopsies  Prostate Biopsy Procedure   Informed consent was obtained, and we discussed the risks of bleeding and infection/sepsis. A time out was performed to ensure correct patient identity.  Pre-Procedure: - Gentamicin and levaquin given for antibiotic prophylaxis - Transrectal Ultrasound performed revealing a 31 gm prostate - No median lobe noted  Procedure: - Prostate block performed using 10 cc 1% lidocaine and biopsies taken from sextant areas, a total of 12 under ultrasound guidance.  Post-Procedure: - Patient tolerated the procedure well - He was counseled to seek immediate medical attention if experiences significant bleeding, fevers, or severe pain - Return in one week to discuss biopsy results  Assessment/ Plan: Will follow up in 1-2 weeks to discuss pathology  Nickolas Madrid, MD 07/03/2021

## 2021-07-03 NOTE — Addendum Note (Signed)
Addended by: Gordy Clement C on: 07/03/2021 10:00 AM   Modules accepted: Orders

## 2021-07-07 ENCOUNTER — Ambulatory Visit (INDEPENDENT_AMBULATORY_CARE_PROVIDER_SITE_OTHER): Payer: Self-pay | Admitting: Podiatry

## 2021-07-07 DIAGNOSIS — Z91199 Patient's noncompliance with other medical treatment and regimen due to unspecified reason: Secondary | ICD-10-CM

## 2021-07-07 LAB — SURGICAL PATHOLOGY

## 2021-07-07 NOTE — Progress Notes (Signed)
Patient was no-show for appointment today 

## 2021-07-10 ENCOUNTER — Ambulatory Visit: Payer: 59 | Admitting: Podiatry

## 2021-07-10 ENCOUNTER — Other Ambulatory Visit: Payer: Self-pay | Admitting: Internal Medicine

## 2021-07-10 DIAGNOSIS — E1142 Type 2 diabetes mellitus with diabetic polyneuropathy: Secondary | ICD-10-CM

## 2021-07-10 NOTE — Telephone Encounter (Signed)
Requested Prescriptions  Pending Prescriptions Disp Refills   metFORMIN (GLUCOPHAGE-XR) 500 MG 24 hr tablet [Pharmacy Med Name: metFORMIN HCl ER 500 MG Oral Tablet Extended Release 24 Hour] 90 tablet 0    Sig: Take 1 tablet by mouth once daily with breakfast     Endocrinology:  Diabetes - Biguanides Failed - 07/10/2021 10:04 AM      Failed - AA eGFR in normal range and within 360 days    GFR calc Af Amer  Date Value Ref Range Status  05/06/2018 >60 >60 mL/min Final    Comment:    (NOTE) The eGFR has been calculated using the CKD EPI equation. This calculation has not been validated in all clinical situations. eGFR's persistently <60 mL/min signify possible Chronic Kidney Disease.    GFR calc non Af Amer  Date Value Ref Range Status  05/06/2018 >60 >60 mL/min Final   eGFR  Date Value Ref Range Status  12/16/2020 69 >59 mL/min/1.73 Final         Passed - Cr in normal range and within 360 days    Creatinine  Date Value Ref Range Status  11/13/2020 142.3 20.0 - 300.0 mg/dL Final   Creat  Date Value Ref Range Status  12/05/2015 1.18 0.70 - 1.33 mg/dL Final   Creatinine, Ser  Date Value Ref Range Status  12/16/2020 1.19 0.76 - 1.27 mg/dL Final         Passed - HBA1C is between 0 and 7.9 and within 180 days    HbA1c, POC (controlled diabetic range)  Date Value Ref Range Status  11/13/2020 6.2 0.0 - 7.0 % Final   Hgb A1c MFr Bld  Date Value Ref Range Status  04/21/2021 6.6 (H) 4.8 - 5.6 % Final    Comment:             Prediabetes: 5.7 - 6.4          Diabetes: >6.4          Glycemic control for adults with diabetes: <7.0          Passed - Valid encounter within last 6 months    Recent Outpatient Visits          2 months ago Need for influenza vaccination   [place], [name], RPH-CPP   3 months ago Type 2 diabetes mellitus with peripheral neuropathy [facility])   [place],  MD   5 months ago Hypertension associated with diabetes [facility])   [place], [name], RPH-CPP   6 months ago Hypertension associated with diabetes [facility])   [place], [name], RPH-CPP   7 months ago Encounter to establish care   [place], MD      Future Appointments            In 1 week Ladell Pier, MD [place]

## 2021-07-15 ENCOUNTER — Ambulatory Visit (INDEPENDENT_AMBULATORY_CARE_PROVIDER_SITE_OTHER): Payer: 59 | Admitting: Podiatry

## 2021-07-15 ENCOUNTER — Ambulatory Visit (INDEPENDENT_AMBULATORY_CARE_PROVIDER_SITE_OTHER): Payer: 59

## 2021-07-15 ENCOUNTER — Other Ambulatory Visit: Payer: Self-pay

## 2021-07-15 ENCOUNTER — Encounter: Payer: Self-pay | Admitting: Podiatry

## 2021-07-15 DIAGNOSIS — M7751 Other enthesopathy of right foot: Secondary | ICD-10-CM | POA: Diagnosis not present

## 2021-07-15 DIAGNOSIS — M79671 Pain in right foot: Secondary | ICD-10-CM | POA: Diagnosis not present

## 2021-07-15 DIAGNOSIS — E119 Type 2 diabetes mellitus without complications: Secondary | ICD-10-CM

## 2021-07-15 MED ORDER — PREGABALIN 75 MG PO CAPS: 75.0000 mg | ORAL_CAPSULE | Freq: Two times a day (BID) | ORAL | 0 refills | Status: DC

## 2021-07-16 ENCOUNTER — Encounter: Payer: Self-pay | Admitting: Podiatry

## 2021-07-16 ENCOUNTER — Telehealth: Payer: Self-pay | Admitting: *Deleted

## 2021-07-16 ENCOUNTER — Ambulatory Visit (INDEPENDENT_AMBULATORY_CARE_PROVIDER_SITE_OTHER): Payer: 59 | Admitting: Urology

## 2021-07-16 DIAGNOSIS — C61 Malignant neoplasm of prostate: Secondary | ICD-10-CM

## 2021-07-16 NOTE — Telephone Encounter (Signed)
Called patient to set up consultation appointment with Dr. Baruch Gouty.  Patient stated he lives in [place] and would like to go to [facility] for his appt.  He had a hard time getting to [place].   Minerva Ends, referrals coordinator forwarded the referral to [place].

## 2021-07-16 NOTE — Progress Notes (Signed)
Virtual Visit via Telephone Note  I connected with Matthew Colon. on 07/16/21 at  9:15 AM EST by telephone and verified that I am speaking with the correct person using two identifiers.   Patient location: Home Provider location: [facility]   I discussed the limitations, risks, security and privacy concerns of performing an evaluation and management service by telephone and the availability of in person appointments. We discussed the impact of the COVID-19 pandemic on the healthcare system, and the importance of social distancing and reducing patient and provider exposure. I also discussed with the patient that there may be a patient responsible charge related to this service. The patient expressed understanding and agreed to proceed.  Reason for visit: New diagnosis of prostate cancer  History of Present Illness: 66 year old male with elevated PSA of 17.6, and prostate MRI in September showed diffuse abnormal signal worrisome for possible diffuse prostate neoplasm.  He lives in [place] and has significant problems with transportation, and unfortunately the urology group in [place] does not take his insurance so he has been coming to [place] at [facility] for visits.  He no showed multiple prostate biopsy appointments, however after extensive conversations over the phone he ultimately underwent a prostate biopsy on 07/03/2021.  Unfortunately, prostate biopsy showed a 30 g prostate with extensive high-grade prostate cancer throughout.  All 12 cores were positive for prostate acinar adenocarcinoma, primarily grade group 5 disease, and max core involvement of 100%.  Prostate MRI showed possible extraprostatic extension beyond the capsule at the prostate base, but no evidence of pelvic metastatic disease or bone lesion in the pelvis.  I had a long conversation with the patient today about these findings, the need for additional staging with bone scan, as  well as further treatments.  With his morbid obesity of 43 he would not be a candidate for robotic prostatectomy.  I think with his challenges with transportation and follow-up, he would be best suited with brachytherapy and likely ADT.  I messaged Dr Baruch Gouty with radiation oncology here in [place] to help facilitate follow-up, but he may be better suited if he is able to meet with a radiation oncologist locally in [place] since transportation is very challenging for him.  Assessment and Plan: High-grade, high-volume prostate cancer  Follow Up: Referral placed to radiation oncology Bone scan ordered, will call with results Will schedule a 1 month virtual visit to confirm that he has been able to see radiation oncology and facilitate treatment moving forward   I discussed the assessment and treatment plan with the patient. The patient was provided an opportunity to ask questions and all were answered. The patient agreed with the plan and demonstrated an understanding of the instructions.   The patient was advised to call back or seek an in-person evaluation if the symptoms worsen or if the condition fails to improve as anticipated.  I provided 15 minutes of non-face-to-face time during this encounter.   Billey Co, MD

## 2021-07-16 NOTE — Progress Notes (Signed)
Subjective: Matthew Marus Sr. presents today referred by Ladell Pier, MD for diabetic foot evaluation.  Patient relates 2016 year history of diabetes.  Patient denies any history of foot wounds.  Patient denies any history of numbness, tingling, burning, pins/needles sensations.  Patient has secondary complaint of right second metatarsophalangeal joint pain.  Patient has been going progressively worse with ambulation.  He would like to discuss treatment options for it.  He has tried some over-the-counter stuff none of which has helped.  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   DDD (degenerative disc disease), cervical 09/21/2017   DJD of acromioclavicular joint (Right) 09/21/2017   DJD of acromioclavicular joint (Bilateral) 09/21/2017   DJD of acromioclavicular joint (Left) 09/21/2017   Osteoarthritis of glenohumeral joint (Left) 09/21/2017   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/2017   Tricompartmental disease of knee (Bilateral) 09/21/2017   Osteoarthritis of knees (Bilateral) 09/21/2017   Suprapatellar effusion of knee (recurrent) (Right) 09/21/2017   Calcaneal spur of feet (Bilateral) 09/21/2017   Hypomagnesemia 09/20/2017   Low testosterone 09/20/2017   Chronic foot pain (Bilateral) 09/20/2017   Vitamin D deficiency 04/04/2017   Chronic shoulder pain (Primary Area of Pain) (Left) 03/30/2017   Chronic knee pain (Tertiary Area of Pain) (Left)  03/30/2017   Disorder of bone, unspecified 03/30/2017   Other reduced mobility 03/30/2017   Other specified health status 03/30/2017   Opioid use agreement exists 03/30/2017   Opiate use 03/30/2017   Chronic pain syndrome 03/30/2017   Chronic upper extremity pain (Secondary Area of Pain) (Bilateral) 03/30/2017   Elevated PSA 12/05/2015   Noncompliance 12/05/2015   Hypertension associated with diabetes ([place]) 03/14/2015   Type 2 diabetes mellitus with diabetic mononeuropathy ([place]) 03/14/2015   Encounter for health maintenance examination in adult 03/14/2015   Screening for prostate cancer 03/14/2015   Special screening for malignant neoplasms, colon 03/14/2015   Hyperlipidemia 03/14/2015   Nocturia 03/14/2015   Urinary frequency 03/14/2015   Need for prophylactic vaccination and inoculation against influenza 03/14/2015   Need for prophylactic vaccination against Streptococcus pneumoniae (pneumococcus) 03/14/2015   Need for Tdap vaccination 03/14/2015   Chronic nausea 03/14/2015   Gastroesophageal reflux disease without esophagitis 03/14/2015    Past Surgical History:  Procedure Laterality Date   COLONOSCOPY     referral pending 03/2015   NO PAST SURGERIES     as of 03/2015    Current Outpatient Medications on File Prior to Visit  Medication Sig Dispense Refill   atorvastatin (LIPITOR) 80 MG tablet Take 1 tablet (80 mg total) by mouth daily. 90 tablet 3   BINAXNOW COVID-19 AG HOME TEST KIT Use as Directed on the Package     COVID-19 mRNA Vac-TriS, Pfizer, SUSP injection Inject into the muscle. 0.3 mL 0   diazepam (VALIUM) 10 MG tablet Take 1 tablet (10 mg total) by mouth once as needed for up to 1 dose for anxiety (take  30 minutes prior to prostate biopsy). 1 tablet 0   fluticasone (FLONASE) 50 MCG/ACT nasal spray Place 1 spray into both nostrils daily. 16 g 2   gabapentin (NEURONTIN) 600 MG tablet Take 1 tablet (600 mg total) by mouth 3 (three) times daily. 90 tablet 6    metFORMIN (GLUCOPHAGE-XR) 500 MG 24 hr tablet Take 1 tablet by mouth once daily with breakfast 90 tablet 0   olmesartan (BENICAR) 20 MG tablet Take 1 tablet (20 mg total) by mouth daily. 90 tablet 1   pantoprazole (PROTONIX) 20 MG tablet Take 1 tablet (20 mg total) by mouth daily. 90 tablet 1   sildenafil (VIAGRA) 50 MG tablet Take 1-2 tablets (50-100 mg total) by mouth daily as needed for erectile dysfunction. 15 tablet 11   traMADol (ULTRAM) 50 MG tablet TAKE 1 TABLET BY MOUTH EVERY 8 HOURS AS NEEDED --EACH  PRESCRIPTION  TO  LAST  1  MONTH 70 tablet 0   No current facility-administered medications on file prior to visit.     No Known Allergies  Social History   Occupational History   Not on file  Tobacco Use   Smoking status: Former    Types: Cigarettes   Smokeless tobacco: Never  Vaping Use   Vaping Use: Never used  Substance and Sexual Activity   Alcohol use: Yes    Comment: occasional beer   Drug use: No   Sexual activity: Yes    Family History  Problem Relation Age of Onset   Diabetes Mother    Hypertension Mother    Other Father        murdered   Diabetes Sister    Arthritis Sister    Heart disease Sister    Diabetes Sister    Diabetes Sister    Cancer Neg Hx    Stroke Neg Hx    Prostate cancer Neg Hx        unknown   Bladder Cancer Neg Hx        unkown   Kidney cancer Neg Hx        unkown    Immunization History  Administered Date(s) Administered   Hepb-cpg 07/17/2018, 08/28/2018   Influenza,inj,Quad PF,6+ Mos 04/29/2014, 03/14/2015, 03/28/2018, 02/26/2019, 07/31/2020, 04/21/2021   Influenza,inj,Quad PF,6-35 Mos 02/26/2019   Influenza-Unspecified 04/29/2014, 03/14/2015   PFIZER Comirnaty(Gray Top)Covid-19 Tri-Sucrose Vaccine 10/29/2020   PFIZER(Purple Top)SARS-COV-2 Vaccination 10/01/2019, 10/24/2019, 04/10/2020   Pneumococcal Polysaccharide-23 03/14/2015, 07/17/2018   Tdap 03/14/2015, 07/17/2018   Zoster Recombinat (Shingrix) 07/26/2018, 09/30/2018     Review of systems: Positive Findings in bold print.  Constitutional:  chills, fatigue, fever, sweats, weight change Communication: Optometrist, sign Ecologist, hand writing, iPad/Android device Head: headaches, head injury Eyes: changes in vision, eye pain, glaucoma, cataracts, macular degeneration, diplopia, glare,  light sensitivity, eyeglasses or contacts, blindness Ears nose mouth throat: hearing impaired, hearing aids,  ringing in ears, deaf, sign language,  vertigo, nosebleeds,  rhinitis,  cold sores, snoring, swollen glands Cardiovascular: HTN, edema, arrhythmia, pacemaker in place, defibrillator in place, chest pain/tightness, chronic anticoagulation, blood clot, heart failure, MI Peripheral Vascular: leg cramps, varicose veins, blood clots, lymphedema, varicosities Respiratory:  asthma, difficulty breathing, denies congestion, SOB, wheezing, cough, emphysema Gastrointestinal: change in appetite or weight, abdominal pain, constipation, diarrhea, nausea, vomiting, vomiting blood, change in bowel habits, abdominal pain, jaundice, rectal bleeding, hemorrhoids, GERD Genitourinary:  nocturia,  pain on urination, polyuria,  blood in urine, Foley catheter, urinary urgency, ESRD on hemodialysis Musculoskeletal: amputation, cramping, stiff joints, painful joints,  decreased joint motion, fractures, OA, gout, hemiplegia, paraplegia, uses cane, wheelchair bound, uses walker, uses rollator Skin: +changes in toenails, color change, dryness, itching, mole changes,  rash, wound(s) Neurological: headaches, numbness in feet, paresthesias in feet, burning in feet, fainting,  seizures, change in speech, migraines, memory problems/poor historian, cerebral palsy, weakness, paralysis, CVA, TIA Endocrine: diabetes, hypothyroidism, hyperthyroidism,  goiter, dry mouth, flushing, heat intolerance, cold intolerance,  excessive thirst, denies polyuria,  nocturia Hematological:  easy bleeding, excessive  bleeding, easy bruising, enlarged lymph nodes, on long term blood thinner, history of past transusions Allergy/immunological:  hives, eczema, frequent infections, multiple drug allergies, seasonal allergies, transplant recipient, multiple food allergies Psychiatric:  anxiety, depression, mood disorder, suicidal ideations, hallucinations, insomnia  Objective: There were no vitals filed for this visit. Vascular Examination: Capillary refill time less than 3 seconds x 10 digits.  Dorsalis pedis pulses palpable 2 out of 4.  Posterior tibial pulses palpable 2 over.  Digital hair not present x 10 digits.  Skin temperature gradient WNL b/l.  Dermatological Examination: Skin with normal turgor, texture and tone b/l  Toenails 1-5 b/l discolored, thick, dystrophic with subungual debris and pain with palpation to nailbeds due to thickness of nails.  Musculoskeletal: Muscle strength 5/5 to all LE muscle groups.  Pain on palpation to the right second metatarsophalangeal joint pain with range of motion of the joint.  No deep intra-articular pain noted.  No extensor or flexor tendinitis noted.  Plantar plate is intact  Neurological: Sensation intact with 10 gram monofilament.  Vibratory sensation intact.  Assessment: NIDDM Encounter for diabetic foot examination MTP capsulitis right second  Plan: Discussed diabetic foot care principles. Literature dispensed on today. Patient to continue soft, supportive shoe gear daily. Patient to report any pedal injuries to medical professional immediately. Follow up one year. Patient/POA to call should there be a concern in the interim. A steroid injection was performed at right second using 1% plain Lidocaine and 10 mg of Kenalog. This was well tolerated.

## 2021-07-17 ENCOUNTER — Other Ambulatory Visit: Payer: Self-pay | Admitting: Internal Medicine

## 2021-07-17 DIAGNOSIS — M159 Polyosteoarthritis, unspecified: Secondary | ICD-10-CM

## 2021-07-17 NOTE — Telephone Encounter (Signed)
Requested medication (s) are due for refill today: Yes  Requested medication (s) are on the active medication list: Yes  Last refill:  06/20/21  Future visit scheduled: Yes  Notes to clinic:  See request.    Requested Prescriptions  Pending Prescriptions Disp Refills   traMADol (ULTRAM) 50 MG tablet [Pharmacy Med Name: traMADol HCl 50 MG Oral Tablet] 70 tablet 0    Sig: TAKE 1 TABLET BY MOUTH EVERY 8 HOURS AS NEEDED     Not Delegated - Analgesics:  Opioid Agonists Failed - 07/17/2021  2:18 PM      Failed - This refill cannot be delegated      Passed - Urine Drug Screen completed in last 360 days      Passed - Valid encounter within last 6 months    Recent Outpatient Visits           2 months ago Need for influenza vaccination   [place], [name], RPH-CPP   4 months ago Type 2 diabetes mellitus with peripheral neuropathy [facility])   [place], MD   6 months ago Hypertension associated with diabetes [facility])   [place], [name], RPH-CPP   7 months ago Hypertension associated with diabetes [facility])   [place], [name], RPH-CPP   8 months ago Encounter to establish care   [place], MD       Future Appointments             In 4 days Ladell Pier, MD [place]

## 2021-07-21 ENCOUNTER — Encounter: Payer: Self-pay | Admitting: Internal Medicine

## 2021-07-21 ENCOUNTER — Other Ambulatory Visit: Payer: Self-pay

## 2021-07-21 ENCOUNTER — Telehealth: Payer: Self-pay | Admitting: Internal Medicine

## 2021-07-21 ENCOUNTER — Ambulatory Visit: Payer: 59 | Attending: Internal Medicine | Admitting: Internal Medicine

## 2021-07-21 VITALS — BP 136/80 | HR 85 | Resp 16 | Wt 295.2 lb

## 2021-07-21 DIAGNOSIS — E785 Hyperlipidemia, unspecified: Secondary | ICD-10-CM

## 2021-07-21 DIAGNOSIS — I152 Hypertension secondary to endocrine disorders: Secondary | ICD-10-CM

## 2021-07-21 DIAGNOSIS — Z6841 Body Mass Index (BMI) 40.0 and over, adult: Secondary | ICD-10-CM

## 2021-07-21 DIAGNOSIS — M159 Polyosteoarthritis, unspecified: Secondary | ICD-10-CM

## 2021-07-21 DIAGNOSIS — E1169 Type 2 diabetes mellitus with other specified complication: Secondary | ICD-10-CM | POA: Diagnosis not present

## 2021-07-21 DIAGNOSIS — Z23 Encounter for immunization: Secondary | ICD-10-CM

## 2021-07-21 DIAGNOSIS — E1159 Type 2 diabetes mellitus with other circulatory complications: Secondary | ICD-10-CM | POA: Diagnosis not present

## 2021-07-21 DIAGNOSIS — K5903 Drug induced constipation: Secondary | ICD-10-CM

## 2021-07-21 DIAGNOSIS — E1142 Type 2 diabetes mellitus with diabetic polyneuropathy: Secondary | ICD-10-CM

## 2021-07-21 DIAGNOSIS — C61 Malignant neoplasm of prostate: Secondary | ICD-10-CM | POA: Insufficient documentation

## 2021-07-21 LAB — GLUCOSE, POCT (MANUAL RESULT ENTRY): POC Glucose: 133 mg/dl — AB (ref 70–99)

## 2021-07-21 LAB — POCT GLYCOSYLATED HEMOGLOBIN (HGB A1C): HbA1c, POC (controlled diabetic range): 6.3 % (ref 0.0–7.0)

## 2021-07-21 MED ORDER — FREESTYLE LIBRE READER DEVI: 1.0000 | Freq: Once | 0 refills | Status: AC

## 2021-07-21 MED ORDER — FREESTYLE LIBRE SENSOR SYSTEM MISC: 12 refills | Status: DC

## 2021-07-21 MED ORDER — TRAMADOL HCL 50 MG PO TABS: 50.0000 mg | ORAL_TABLET | Freq: Three times a day (TID) | ORAL | 2 refills | Status: DC | PRN

## 2021-07-21 MED ORDER — SENNOSIDES 8.6 MG PO TABS: 1.0000 | ORAL_TABLET | Freq: Every day | ORAL | 3 refills | Status: AC | PRN

## 2021-07-21 NOTE — Addendum Note (Signed)
Addended by: Karle Plumber B on: 07/21/2021 05:06 PM   Modules accepted: Orders

## 2021-07-21 NOTE — Telephone Encounter (Signed)
Medication Refill - Medication:  traMADol (ULTRAM) 50 MG tablet   Has the patient contacted their pharmacy? Yes.   *Pharmacy called on behalf of pt, stating they did not have the medication in stock and pt wanted it sent over to the [facility] stated they will go ahead and cancel it out on there end*  Preferred Pharmacy (with phone number or street name):  [facility] [place], [place] - [address]  Phone:  [phone] Fax:  [phone]   Has the patient been seen for an appointment in the last year OR does the patient have an upcoming appointment? Yes.    Agent: Please be advised that RX refills may take up to 3 business days. We ask that you follow-up with your pharmacy.

## 2021-07-21 NOTE — Progress Notes (Signed)
Patient ID: Matthew Duzan Sr., male    DOB: 05-03-1956  MRN: [id number]  CC: Diabetes, Knee Pain (B/l), and Shoulder Pain (Left )   Subjective: Matthew Colon is a 66 y.o. male who presents for chronic ds management. His concerns today include:  Patient with history of HTN, DM peripheral neuropathy, HL, GERD, OA BL knees and LT shoulder, elevated PSA, ED, BPH, vitamin D deficiency,former smoker  Since last visit, he was dx with high grade prostate CA. plan now is for bone scan then radiation plus ADT.  DM/Obesity:   Lab Results  Component Value Date   HGBA1C 6.3 07/21/2021  checking BS 4-6x/mth. Range in 130s.  He would like to get the [place] meter or Dexcom so that he does not have to prick his finger. Taking Metformin XR 500 mg once a day.  Notice bumps on forehead sometimes and wonders whether Metformin is causing it.  He has been on metformin for a while. Doing better with eating habits.  He stopped drinking sodas. Eats fried foods occasionally. Eats salads and fruits Missed a step at work because light was not on 6 wks ago. Fell and hurt RT big toe; wears steel toe boots.  seen at [facility]. Nothing broken.  Has not been to gym since then but plans to try to go back  -wgh stable -referred to eye doctor on last visit.  Reports he did get letter but did not recognize who it was from.  According to the note in referral section, he no showed appointment with Dr. Katy Fitch.  OA knees: Patient is upset stating that there is always a delay of a day or 2 with him getting refill on the tramadol.  He does not take it while at work because it can cause some sleepiness for him.  He takes it as soon as he gets off at at work and then may take an extra 1 at night.  On average he is taking 2-3 a day.  He is wanting to know whether the amount that he gets per month can be increased.  It was increased on last visit from 60 to 70 tablets/month.  He states that he usually runs out before the month is up. 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   Denies any drowsiness at work.  Endorses constipation.  Uses OTC stool softener which do not help   HYPERTENSION Currently taking: see medication list.  Reports compliance with taking Benicar. Med Adherence: [x] Yes    [] No Medication side effects: [] Yes    [x] No Adherence with salt restriction: [x] Yes    [] No Home Monitoring?: [] Yes    [x] No Monitoring Frequency:  Home BP results range:  SOB? [] Yes    [x] No Chest Pain?: [] Yes    [x] No Leg swelling?: [x] Yes sometimes with standing at work.  Wears compression socks    [] No Headaches?: [] Yes    [x] No Dizziness? [] Yes    [x] No Comments:   HL:  taking and tolerating Lipitor  HM:  has appt with [facility] 07/24/2021 colonoscopy. Need for  PCV 15 today. Patient Active Problem List   Diagnosis Date Noted   Hyperlipidemia associated with type 2 diabetes mellitus ([place]) 11/13/2020   Morbid obesity ([place]) 11/13/2020   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   DDD (degenerative disc disease), cervical 09/21/2017   DJD of acromioclavicular joint (Right) 09/21/2017  DJD of acromioclavicular joint (Bilateral) 09/21/2017   DJD of acromioclavicular joint (Left) 09/21/2017   Osteoarthritis of glenohumeral joint (Left) 09/21/2017   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/2017   Tricompartmental disease of knee (Bilateral) 09/21/2017   Osteoarthritis of knees (Bilateral) 09/21/2017   Suprapatellar effusion of knee (recurrent) (Right) 09/21/2017   Calcaneal spur of feet (Bilateral) 09/21/2017   Hypomagnesemia 09/20/2017   Low testosterone 09/20/2017   Chronic foot pain (Bilateral) 09/20/2017   Vitamin D deficiency 04/04/2017   Chronic shoulder pain (Primary Area of Pain) (Left) 03/30/2017   Chronic knee pain (Tertiary Area of Pain) (Left) 03/30/2017   Disorder of bone, unspecified 03/30/2017   Other reduced mobility 03/30/2017   Other specified health status 03/30/2017   Opioid use  agreement exists 03/30/2017   Opiate use 03/30/2017   Chronic pain syndrome 03/30/2017   Chronic upper extremity pain (Secondary Area of Pain) (Bilateral) 03/30/2017   Elevated PSA 12/05/2015   Noncompliance 12/05/2015   Hypertension associated with diabetes ([place]) 03/14/2015   Type 2 diabetes mellitus with diabetic mononeuropathy ([place]) 03/14/2015   Encounter for health maintenance examination in adult 03/14/2015   Screening for prostate cancer 03/14/2015   Special screening for malignant neoplasms, colon 03/14/2015   Hyperlipidemia 03/14/2015   Nocturia 03/14/2015   Urinary frequency 03/14/2015   Need for prophylactic vaccination and inoculation against influenza 03/14/2015   Need for prophylactic vaccination against Streptococcus pneumoniae (pneumococcus) 03/14/2015   Need for Tdap vaccination 03/14/2015   Chronic nausea 03/14/2015   Gastroesophageal reflux disease without esophagitis 03/14/2015     Current Outpatient Medications on File Prior to Visit  Medication Sig Dispense Refill   atorvastatin (LIPITOR) 80 MG tablet Take 1 tablet (80 mg total) by mouth daily. 90 tablet 3   BINAXNOW COVID-19 AG HOME TEST KIT Use as Directed on the Package     COVID-19 mRNA Vac-TriS, Pfizer, SUSP injection Inject into the muscle. 0.3 mL 0   diazepam (VALIUM) 10 MG tablet Take 1 tablet (10 mg total) by mouth once as needed for up to 1 dose for anxiety (take 30 minutes prior to prostate biopsy). 1 tablet 0   fluticasone (FLONASE) 50 MCG/ACT nasal spray Place 1 spray into both nostrils daily. 16 g 2   gabapentin (NEURONTIN) 600 MG tablet Take 1 tablet (600 mg total) by mouth 3 (three) times daily. 90 tablet 6   metFORMIN (GLUCOPHAGE-XR) 500 MG 24 hr tablet Take 1 tablet by mouth once daily with breakfast 90 tablet 0   olmesartan (BENICAR) 20 MG tablet Take 1 tablet (20 mg total) by mouth daily. 90 tablet 1   pantoprazole (PROTONIX) 20 MG tablet Take 1 tablet (20 mg total) by mouth daily. 90 tablet 1    pregabalin (LYRICA) 75 MG capsule Take 1 capsule (75 mg total) by mouth 2 (two) times daily. 60 capsule 0   sildenafil (VIAGRA) 50 MG tablet Take 1-2 tablets (50-100 mg total) by mouth daily as needed for erectile dysfunction. 15 tablet 11   No current facility-administered medications on file prior to visit.    No Known Allergies  Social History   Socioeconomic History   Marital status: Divorced    Spouse name: Not on file   Number of children: Not on file   Years of education: Not on file   Highest education level: Not on file  Occupational History   Not on file  Tobacco Use   Smoking status: Former    Types: Cigarettes   Smokeless tobacco: Never  Vaping Use   Vaping Use: Never used  Substance and Sexual Activity   Alcohol use: Yes    Comment: occasional beer   Drug use: No   Sexual activity: Yes  Other Topics Concern   Not on file  Social History Narrative   Lives with his sister.   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   Exercise - active on the job.  Has 3 children in [place].   Social Determinants of Health   Financial Resource Strain: Not on file  Food Insecurity: Not on file  Transportation Needs: Not on file  Physical Activity: Not on file  Stress: Not on file  Social Connections: Not on file  Intimate Partner Violence: Not on file    Family History  Problem Relation Age of Onset   Diabetes Mother    Hypertension Mother    Other Father        murdered   Diabetes Sister    Arthritis Sister    Heart disease Sister    Diabetes Sister    Diabetes Sister    Cancer Neg Hx    Stroke Neg Hx    Prostate cancer Neg Hx        unknown   Bladder Cancer Neg Hx        unkown   Kidney cancer Neg Hx        unkown    Past Surgical History:  Procedure Laterality Date   COLONOSCOPY     referral pending 03/2015   NO PAST SURGERIES     as of 03/2015    ROS: Review of Systems Negative except as stated above  PHYSICAL EXAM: BP 136/80    Pulse 85    Resp  16    Wt 295 lb 3.2 oz (133.9 kg)    SpO2 95%    BMI 41.17 kg/m   Wt Readings from Last 3 Encounters:  07/21/21 295 lb 3.2 oz (133.9 kg)  07/03/21 299 lb (135.6 kg)  02/05/21 297 lb (134.7 kg)    Physical Exam   General appearance - alert, well appearing, morbidly obese older African-American male and in no distress Mental status - normal mood, behavior, speech, dress, motor activity, and thought processes Neck - supple, no significant adenopathy Chest - clear to auscultation, no wheezes, rales or rhonchi, symmetric air entry Heart - normal rate, regular rhythm, normal S1, S2, no murmurs, rubs, clicks or gallops Musculoskeletal -knees: Mild crepitus and discomfort with passive range of motion of both knees right greater than left. Extremities -no lower extremity edema.  CMP Latest Ref Rng & Units 12/16/2020 05/06/2018 03/30/2017  Glucose 65 - 99 mg/dL 86 102(H) 108(H)  BUN 8 - 27 mg/dL 11 22 21(H)  Creatinine 0.76 - 1.27 mg/dL 1.19 1.24 1.11  Sodium 134 - 144 mmol/L 141 140 143  Potassium 3.5 - 5.2 mmol/L 4.4 3.7 4.1  Chloride 96 - 106 mmol/L 103 108 108  CO2 20 - 29 mmol/L _0 Calcium 8.6 - 10.2 mg/dL 9.8 9.3 9.7  Total Protein 6.0 - 8.5 g/dL 6.8 6.5 7.1  Total Bilirubin 0.0 - 1.2 mg/dL 0.4 0.4 0.5  Alkaline Phos 44 - 121 IU/L 93 55 53  AST 0 - 40 IU/L _1 ALT 0 - 44 IU/L 7 8 10(L)   Lipid Panel     Component Value Date/Time   CHOL 231 (H) 05/23/2018 0922   TRIG 50 05/23/2018 0922   HDL 75 05/23/2018 1191  CHOLHDL 3.1 05/23/2018 0922   CHOLHDL 2.8 03/14/2015 0001   VLDL 17 03/14/2015 0001   LDLCALC 146 (H) 05/23/2018 0922    CBC    Component Value Date/Time   WBC 10.9 (H) 05/06/2018 0805   RBC 4.09 (L) 05/06/2018 0805   HGB 11.5 (L) 05/06/2018 0805   HCT 35.2 (L) 05/06/2018 0805   PLT 348 05/06/2018 0805   MCV 86.1 05/06/2018 0805   MCH 28.1 05/06/2018 0805   MCHC 32.7 05/06/2018 0805   RDW 14.1 05/06/2018 0805   LYMPHSABS 2.8 05/06/2018 0805    MONOABS 0.8 05/06/2018 0805   EOSABS 0.2 05/06/2018 0805   BASOSABS 0.1 05/06/2018 0805    ASSESSMENT AND PLAN:  1. Type 2 diabetes mellitus with peripheral neuropathy (HCC) At goal.  Continue metformin.  I doubt it causes the breakout that he sometimes gets on his forehead.  Discussed and encourage healthy eating habits and commended him on his efforts so far.  Encouraged him to get back into the gym to exercise as tolerated.  I will resubmit the referral to ophthalmology. - POCT glucose (manual entry) - POCT glycosylated hemoglobin (Hb A1C) - Ambulatory referral to Ophthalmology - Continuous Blood Gluc Receiver (FREESTYLE LIBRE READER) DEVI; 1 Device by Does not apply route once for 1 dose.  Dispense: 1 each; Refill: 0 - Continuous Blood Gluc Sensor (FREESTYLE LIBRE SENSOR SYSTEM) MISC; Change sensor Q 2 wks  Dispense: 2 each; Refill: 12  2. Polyarticular osteoarthritis Patient reports relief from pain and is able to function during the day with the tramadol.  We will continue to prescribe for him to take 1 tablet every 8 hours as needed.  We have increased the monthly amount to 80 tablets. -[facility] controlled substance reporting system reviewed.  He is up-to-date with controlled substance prescribing agreement. - traMADol (ULTRAM) 50 MG tablet; Take 1 tablet (50 mg total) by mouth every 8 (eight) hours as needed (each prescription to last 1 mth).  Dispense: 80 tablet; Refill: 2  3. Hypertension associated with diabetes ([place]) Repeat blood pressure today closer to goal.  Continue Benicar.  4. Hyperlipidemia associated with type 2 diabetes mellitus ([place]) Continue atorvastatin.  5. Morbid obesity ([place]) See #1 above.  6. Drug-induced constipation Due to tramadol. - senna (SENOKOT) 8.6 MG tablet; Take 1 tablet (8.6 mg total) by mouth daily as needed for constipation.  Dispense: 30 tablet; Refill: 3  7. Prostate cancer [facility]) Being followed by urology and plans to start treatment  after he has a bone scan.  8. Need for vaccination against Streptococcus pneumoniae We did not have PCV 15 so PCV 20 given. - Pneumococcal conjugate vaccine 20-valent  Patient was given the opportunity to ask questions.  Patient verbalized understanding of the plan and was able to repeat key elements of the plan.   Orders Placed This Encounter  Procedures   Pneumococcal conjugate vaccine 20-valent   Ambulatory referral to Ophthalmology   POCT glucose (manual entry)   POCT glycosylated hemoglobin (Hb A1C)     Requested Prescriptions   Signed Prescriptions Disp Refills   senna (SENOKOT) 8.6 MG tablet 30 tablet 3    Sig: Take 1 tablet (8.6 mg total) by mouth daily as needed for constipation.   traMADol (ULTRAM) 50 MG tablet 80 tablet 2    Sig: Take 1 tablet (50 mg total) by mouth every 8 (eight) hours as needed (each prescription to last 1 mth).   Continuous Blood Gluc Receiver (FREESTYLE LIBRE READER) DEVI  1 each 0    Sig: 1 Device by Does not apply route once for 1 dose.   Continuous Blood Gluc Sensor (FREESTYLE LIBRE SENSOR SYSTEM) MISC 2 each 12    Sig: Change sensor Q 2 wks    Return in about 4 months (around 11/18/2021).  Karle Plumber, MD, FACP

## 2021-07-21 NOTE — Telephone Encounter (Signed)
Pt called in to follow up on this Rx, please advise if it will be sent to [facility].

## 2021-07-21 NOTE — Patient Instructions (Signed)
Healthy Eating °Following a healthy eating pattern may help you to achieve and maintain a healthy body weight, reduce the risk of chronic disease, and live a long and productive life. It is important to follow a healthy eating pattern at an appropriate calorie level for your body. Your nutritional needs should be met primarily through food by choosing a variety of nutrient-rich foods. °What are tips for following this plan? °Reading food labels °Read labels and choose the following: °Reduced or low sodium. °Juices with 100% fruit juice. °Foods with low saturated fats and high polyunsaturated and monounsaturated fats. °Foods with whole grains, such as whole wheat, cracked wheat, brown rice, and wild rice. °Whole grains that are fortified with folic acid. This is recommended for women who are pregnant or who want to become pregnant. °Read labels and avoid the following: °Foods with a lot of added sugars. These include foods that contain brown sugar, corn sweetener, corn syrup, dextrose, fructose, glucose, high-fructose corn syrup, honey, invert sugar, lactose, malt syrup, maltose, molasses, raw sugar, sucrose, trehalose, or turbinado sugar. °Do not eat more than the following amounts of added sugar per day: °6 teaspoons (25 g) for women. °9 teaspoons (38 g) for men. °Foods that contain processed or refined starches and grains. °Refined grain products, such as white flour, degermed cornmeal, white bread, and white rice. °Shopping °Choose nutrient-rich snacks, such as vegetables, whole fruits, and nuts. Avoid high-calorie and high-sugar snacks, such as potato chips, fruit snacks, and candy. °Use oil-based dressings and spreads on foods instead of solid fats such as butter, stick margarine, or cream cheese. °Limit pre-made sauces, mixes, and "instant" products such as flavored rice, instant noodles, and ready-made pasta. °Try more plant-protein sources, such as tofu, tempeh, black beans, edamame, lentils, nuts, and  seeds. °Explore eating plans such as the Mediterranean diet or vegetarian diet. °Cooking °Use oil to sauté or stir-fry foods instead of solid fats such as butter, stick margarine, or lard. °Try baking, boiling, grilling, or broiling instead of frying. °Remove the fatty part of meats before cooking. °Steam vegetables in water or broth. °Meal planning ° °At meals, imagine dividing your plate into fourths: °One-half of your plate is fruits and vegetables. °One-fourth of your plate is whole grains. °One-fourth of your plate is protein, especially lean meats, poultry, eggs, tofu, beans, or nuts. °Include low-fat dairy as part of your daily diet. °Lifestyle °Choose healthy options in all settings, including home, work, school, restaurants, or stores. °Prepare your food safely: °Wash your hands after handling raw meats. °Keep food preparation surfaces clean by regularly washing with hot, soapy water. °Keep raw meats separate from ready-to-eat foods, such as fruits and vegetables. °Cook seafood, meat, poultry, and eggs to the recommended internal temperature. °Store foods at safe temperatures. In general: °Keep cold foods at 40°F (4.4°C) or below. °Keep hot foods at 140°F (60°C) or above. °Keep your freezer at 0°F (-17.8°C) or below. °Foods are no longer safe to eat when they have been between the temperatures of 40°-140°F (4.4-60°C) for more than 2 hours. °What foods should I eat? °Fruits °Aim to eat 2 cup-equivalents of fresh, canned (in natural juice), or frozen fruits each day. Examples of 1 cup-equivalent of fruit include 1 small apple, 8 large strawberries, 1 cup canned fruit, ½ cup dried fruit, or 1 cup 100% juice. °Vegetables °Aim to eat 2½-3 cup-equivalents of fresh and frozen vegetables each day, including different varieties and colors. Examples of 1 cup-equivalent of vegetables include 2 medium carrots, 2 cups raw,   leafy greens, 1 cup chopped vegetable (raw or cooked), or 1 medium baked potato. °Grains °Aim to  eat 6 ounce-equivalents of whole grains each day. Examples of 1 ounce-equivalent of grains include 1 slice of bread, 1 cup ready-to-eat cereal, 3 cups popcorn, or ½ cup cooked rice, pasta, or cereal. °Meats and other proteins °Aim to eat 5-6 ounce-equivalents of protein each day. Examples of 1 ounce-equivalent of protein include 1 egg, 1/2 cup nuts or seeds, or 1 tablespoon (16 g) peanut butter. A cut of meat or fish that is the size of a deck of cards is about 3-4 ounce-equivalents. °Of the protein you eat each week, try to have at least 8 ounces come from seafood. This includes salmon, trout, herring, and anchovies. °Dairy °Aim to eat 3 cup-equivalents of fat-free or low-fat dairy each day. Examples of 1 cup-equivalent of dairy include 1 cup (240 mL) milk, 8 ounces (250 g) yogurt, 1½ ounces (44 g) natural cheese, or 1 cup (240 mL) fortified soy milk. °Fats and oils °Aim for about 5 teaspoons (21 g) per day. Choose monounsaturated fats, such as canola and olive oils, avocados, peanut butter, and most nuts, or polyunsaturated fats, such as sunflower, corn, and soybean oils, walnuts, pine nuts, sesame seeds, sunflower seeds, and flaxseed. °Beverages °Aim for six 8-oz glasses of water per day. Limit coffee to three to five 8-oz cups per day. °Limit caffeinated beverages that have added calories, such as soda and energy drinks. °Limit alcohol intake to no more than 1 drink a day for nonpregnant women and 2 drinks a day for men. One drink equals 12 oz of beer (355 mL), 5 oz of wine (148 mL), or 1½ oz of hard liquor (44 mL). °Seasoning and other foods °Avoid adding excess amounts of salt to your foods. Try flavoring foods with herbs and spices instead of salt. °Avoid adding sugar to foods. °Try using oil-based dressings, sauces, and spreads instead of solid fats. °This information is based on general [place] nutrition guidelines. For more information, visit choosemyplate.gov. Exact amounts may vary based on your nutrition  needs. °Summary °A healthy eating plan may help you to maintain a healthy weight, reduce the risk of chronic diseases, and stay active throughout your life. °Plan your meals. Make sure you eat the right portions of a variety of nutrient-rich foods. °Try baking, boiling, grilling, or broiling instead of frying. °Choose healthy options in all settings, including home, work, school, restaurants, or stores. °This information is not intended to replace advice given to you by your health care provider. Make sure you discuss any questions you have with your health care provider. °Document Revised: 02/17/2021 Document Reviewed: 02/17/2021 °Elsevier Patient Education © 2022 Elsevier Inc. ° °

## 2021-07-22 NOTE — Telephone Encounter (Signed)
Pt states he has received rx from [facility]. Pt states he told [facility] not to fill rx

## 2021-07-24 ENCOUNTER — Encounter (HOSPITAL_COMMUNITY): Payer: 59

## 2021-07-24 ENCOUNTER — Ambulatory Visit: Payer: 59 | Admitting: Gastroenterology

## 2021-07-27 ENCOUNTER — Institutional Professional Consult (permissible substitution): Payer: 59 | Admitting: Radiation Oncology

## 2021-07-30 ENCOUNTER — Encounter (HOSPITAL_COMMUNITY)
Admission: RE | Admit: 2021-07-30 | Discharge: 2021-07-30 | Disposition: A | Discharge status: Home / Self Care | Payer: 59 | Source: Ambulatory Visit | Attending: Urology | Admitting: Urology

## 2021-07-30 ENCOUNTER — Other Ambulatory Visit: Payer: Self-pay

## 2021-07-30 DIAGNOSIS — C61 Malignant neoplasm of prostate: Secondary | ICD-10-CM | POA: Diagnosis present

## 2021-07-30 MED ORDER — TECHNETIUM TC 99M MEDRONATE IV KIT
20.0000 | PACK | Freq: Once | INTRAVENOUS | Status: AC | PRN
  Administered 2021-07-30: 20.9 via INTRAVENOUS

## 2021-08-10 NOTE — Progress Notes (Addendum)
GU Location of Tumor / Histology: Prostate Ca  If Prostate Cancer, PSA is (17.6 as of 03/2021)  Biopsies  06/2021 Dr. Diamantina [place] Prostate biopsy showed a 30 g prostate with extensive high-grade prostate cancer throughout.  All 12 cores were positive for prostate acinar adenocarcinoma, primarily grade group 5 disease, and max core involvement of 100%. Prostate MRI showed possible extraprostatic extension beyond the capsule at the prostate base, but no evidence of pelvic metastatic disease or bone lesion in the pelvis.  Past/Anticipated interventions by urology, if any:  07/16/2021 Dr. Diamantina [place] High-grade, high-volume prostate cancer.  Referral placed to radiation oncology. Bone scan ordered, will call with results. Will schedule a 1 month virtual visit to confirm that he has been able to see radiation oncology and facilitate treatment moving forward.  Past/Anticipated interventions by medical oncology, if any:   Weight changes, if any:  No  IPSS:  19 SHIM:  16  Bowel/Bladder complaints, if any:  Constipation is a problem due to the use of Tramadol.  Is using OTC stool softeners which do not help per patient. New order Linzess for constipation 08/11/2021.  Nausea/Vomiting, if any: No vomiting ans occasional nausea  Pain issues, if any:  0/10  SAFETY ISSUES: Prior radiation? No Pacemaker/ICD? No Possible current pregnancy?  Male Is the patient on methotrexate? No  Current Complaints / other details:  More information on treatment options.

## 2021-08-11 ENCOUNTER — Encounter: Payer: Self-pay | Admitting: Physician Assistant

## 2021-08-11 ENCOUNTER — Ambulatory Visit: Payer: 59 | Admitting: Physician Assistant

## 2021-08-11 ENCOUNTER — Other Ambulatory Visit: Payer: Self-pay

## 2021-08-11 ENCOUNTER — Ambulatory Visit
Admission: RE | Admit: 2021-08-11 | Discharge: 2021-08-11 | Disposition: A | Discharge status: Home / Self Care | Payer: 59 | Source: Ambulatory Visit | Attending: Radiation Oncology | Admitting: Radiation Oncology

## 2021-08-11 VITALS — BP 122/69 | HR 102 | Temp 98.0°F | Resp 20 | Ht 68.5 in | Wt 289.6 lb

## 2021-08-11 VITALS — BP 116/68 | HR 96 | Ht 68.5 in | Wt 289.5 lb

## 2021-08-11 DIAGNOSIS — E119 Type 2 diabetes mellitus without complications: Secondary | ICD-10-CM | POA: Diagnosis not present

## 2021-08-11 DIAGNOSIS — I10 Essential (primary) hypertension: Secondary | ICD-10-CM | POA: Insufficient documentation

## 2021-08-11 DIAGNOSIS — C61 Malignant neoplasm of prostate: Secondary | ICD-10-CM | POA: Insufficient documentation

## 2021-08-11 DIAGNOSIS — Z1211 Encounter for screening for malignant neoplasm of colon: Secondary | ICD-10-CM

## 2021-08-11 DIAGNOSIS — E785 Hyperlipidemia, unspecified: Secondary | ICD-10-CM | POA: Insufficient documentation

## 2021-08-11 DIAGNOSIS — Z7984 Long term (current) use of oral hypoglycemic drugs: Secondary | ICD-10-CM | POA: Insufficient documentation

## 2021-08-11 DIAGNOSIS — M79676 Pain in unspecified toe(s): Secondary | ICD-10-CM | POA: Diagnosis not present

## 2021-08-11 DIAGNOSIS — F17210 Nicotine dependence, cigarettes, uncomplicated: Secondary | ICD-10-CM | POA: Diagnosis not present

## 2021-08-11 DIAGNOSIS — W109XXA Fall (on) (from) unspecified stairs and steps, initial encounter: Secondary | ICD-10-CM | POA: Diagnosis not present

## 2021-08-11 DIAGNOSIS — K219 Gastro-esophageal reflux disease without esophagitis: Secondary | ICD-10-CM | POA: Diagnosis not present

## 2021-08-11 DIAGNOSIS — Z79899 Other long term (current) drug therapy: Secondary | ICD-10-CM | POA: Diagnosis not present

## 2021-08-11 DIAGNOSIS — K5909 Other constipation: Secondary | ICD-10-CM | POA: Diagnosis not present

## 2021-08-11 DIAGNOSIS — E1140 Type 2 diabetes mellitus with diabetic neuropathy, unspecified: Secondary | ICD-10-CM | POA: Insufficient documentation

## 2021-08-11 MED ORDER — NA SULFATE-K SULFATE-MG SULF 17.5-3.13-1.6 GM/177ML PO SOLN
1.0000 | ORAL | 0 refills | Status: DC
Start: 2021-08-11 — End: 2021-09-30

## 2021-08-11 MED ORDER — LINACLOTIDE 72 MCG PO CAPS: 72.0000 ug | ORAL_CAPSULE | Freq: Every day | ORAL | 5 refills | Status: DC

## 2021-08-11 NOTE — Progress Notes (Signed)
Radiation Oncology         [phone]) [phone] ________________________________  Initial Outpatient Consultation  Name: Matthew Smail Sr. MRN: [id number]  Date: 08/11/2021  DOB: 02/20/1956  GG:YIRSWNI, [name], MD  Billey Co, MD   REFERRING PHYSICIAN: Billey Co, MD  DIAGNOSIS: 66 y.o. 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    ICD-10-CM   1. Malignant neoplasm of prostate ([place])  C61       HISTORY OF PRESENT ILLNESS: Matthew Azeez Sr. is a 66 y.o. male with a diagnosis of prostate cancer. He has a history of an elevated PSA dating back to at least 2016, at which time it was 9 and increased further to 9.6 in 2017. He did not see a urologist at that time, just continued to monitor the PSA through his PCP at the [facility] and [facility]. More recently, he was noted to have an elevated PSA of 17.6 by his primary care provider, Jeanice Lim, PA-C on labs with his routine physical.  Accordingly, he was referred for evaluation in urology by Dr. Diamantina Providence on 02/05/21,  digital rectal examination was performed at that time revealing no masses. He underwent prostate MRI on 03/13/21 showing diffuse abnormal signal throughout the peripheral zone, commonly seen in setting of prostatitis, however there were areas that appeared to extend beyond capsule at the base (PI-RADS 3, with some concern for diffuse prostate neoplasm). There was indeterminate low signal extending to the seminal vesicles but no evidence of pelvic metastatic disease or bony lesions. The patient proceeded to transrectal ultrasound with 12 biopsies of the prostate on 07/03/21.  The prostate volume measured 31 cc.  Out of 12 core biopsies, all 12 were positive.  The maximum Gleason score was 5+5, and this was seen in the right lateral base and right lateral mid. Additionally, Gleason 5+4 was seen in the left lateral apex, left lateral mid, left lateral base, right  base, left apex, left mid, and left base, Gleason 4+5 was seen in the right mid, and Gleason 3+4 in the right apex and right lateral apex. Per diagnostic comment, "the right apex and right lateral apex biopsies contain adenocarcinoma with mostly Gleason pattern 3, with discontinuous foci of pattern 4 and 5. This finding does not change the overall diagnosis of extensive grade group 5 disease."  He underwent staging bone scan on 07/30/21 which did not show findings suspicious for osseous metastases.   The patient reviewed the biopsy results with his urologist and he has kindly been referred today for discussion of potential radiation treatment options.   PREVIOUS RADIATION THERAPY: No  PAST MEDICAL HISTORY:  Past Medical History:  Diagnosis Date   Allergy    Arthritis    DM (diabetes mellitus) ([place])    Foot pain, bilateral    referred to podiatry 06/2014   GERD (gastroesophageal reflux disease)    long history of   Hyperlipidemia    Hypertension    Polyarthralgia    shoulders, knees, ankles, wrists; neg rheum lab screen 04/2014   Prostate cancer ([place])    Type 2 diabetes mellitus with diabetic neuropathy ([place])    Wears glasses       PAST SURGICAL HISTORY: Past Surgical History:  Procedure Laterality Date   COLONOSCOPY     referral pending 03/2015   NO PAST SURGERIES     as of 03/2015    FAMILY HISTORY:  Family History  Problem Relation  Age of Onset   Diabetes Mother    Hypertension Mother    Other Father        murdered   Diabetes Sister    Arthritis Sister    Heart disease Sister    Diabetes Sister    Diabetes Sister    Cancer Neg Hx    Stroke Neg Hx    Prostate cancer Neg Hx        unknown   Bladder Cancer Neg Hx        unkown   Kidney cancer Neg Hx        unkown    SOCIAL HISTORY:  Social History   Socioeconomic History   Marital status: Divorced    Spouse name: Not on file   Number of children: 3   Years of education: Not on file   Highest education  level: Not on file  Occupational History   Occupation: switcher/ spotter/forklift  Tobacco Use   Smoking status: Some Days    Types: Cigarettes   Smokeless tobacco: Never  Vaping Use   Vaping Use: Never used  Substance and Sexual Activity   Alcohol use: Yes    Comment: occasional beer   Drug use: No   Sexual activity: Yes  Other Topics Concern   Not on file  Social History Narrative   Lives with his sister.   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   Exercise - active on the job.  Has 3 children in [place].   Social Determinants of Health   Financial Resource Strain: Not on file  Food Insecurity: Not on file  Transportation Needs: Not on file  Physical Activity: Not on file  Stress: Not on file  Social Connections: Not on file  Intimate Partner Violence: Not on file    ALLERGIES: Patient has no known allergies.  MEDICATIONS:  Current Outpatient Medications  Medication Sig Dispense Refill   atorvastatin (LIPITOR) 80 MG tablet Take 1 tablet (80 mg total) by mouth daily. 90 tablet 3   Continuous Blood Gluc Sensor (FREESTYLE LIBRE SENSOR SYSTEM) MISC Change sensor Q 2 wks 2 each 12   diazepam (VALIUM) 10 MG tablet Take 1 tablet (10 mg total) by mouth once as needed for up to 1 dose for anxiety (take 30 minutes prior to prostate biopsy). 1 tablet 0   fluticasone (FLONASE) 50 MCG/ACT nasal spray Place 1 spray into both nostrils daily. 16 g 2   gabapentin (NEURONTIN) 600 MG tablet Take 1 tablet (600 mg total) by mouth 3 (three) times daily. 90 tablet 6   linaclotide (LINZESS) 72 MCG capsule Take 1 capsule (72 mcg total) by mouth daily before breakfast. 30 capsule 5   metFORMIN (GLUCOPHAGE-XR) 500 MG 24 hr tablet Take 1 tablet by mouth once daily with breakfast 90 tablet 0   Na Sulfate-K Sulfate-Mg Sulf (SUPREP BOWEL PREP KIT) 17.5-3.13-1.6 GM/177ML SOLN Take 1 kit by mouth as directed. For colonoscopy prep 354 mL 0   olmesartan (BENICAR) 20 MG tablet Take 1 tablet (20 mg total) by  mouth daily. 90 tablet 1   pantoprazole (PROTONIX) 20 MG tablet Take 1 tablet (20 mg total) by mouth daily. 90 tablet 1   pregabalin (LYRICA) 75 MG capsule Take 1 capsule (75 mg total) by mouth 2 (two) times daily. 60 capsule 0   senna (SENOKOT) 8.6 MG tablet Take 1 tablet (8.6 mg total) by mouth daily as needed for constipation. 30 tablet 3   sildenafil (VIAGRA) 50 MG tablet Take  1-2 tablets (50-100 mg total) by mouth daily as needed for erectile dysfunction. 15 tablet 11   traMADol (ULTRAM) 50 MG tablet Take 1 tablet (50 mg total) by mouth every 8 (eight) hours as needed (each prescription to last 1 mth). 80 tablet 2   No current facility-administered medications for this encounter.    REVIEW OF SYSTEMS:  On review of systems, the patient reports that he is doing well overall. He denies any chest pain, shortness of breath, cough, fevers, chills, night sweats, unintended weight changes. He denies any bowel disturbances, and denies abdominal pain, nausea or vomiting. He denies any new musculoskeletal or joint aches or pains. His IPSS was 19, indicating moderate urinary symptoms. His SHIM was 16, indicating he has moderate erectile dysfunction. A complete review of systems is obtained and is otherwise negative.    PHYSICAL EXAM:  Wt Readings from Last 3 Encounters:  08/11/21 289 lb 9.6 oz (131.4 kg)  08/11/21 289 lb 8 oz (131.3 kg)  07/21/21 295 lb 3.2 oz (133.9 kg)   Temp Readings from Last 3 Encounters:  08/11/21 98 F (36.7 C)  08/11/18 98.3 F (36.8 C) (Oral)  05/23/18 97.7 F (36.5 C) (Oral)   BP Readings from Last 3 Encounters:  08/11/21 122/69  08/11/21 116/68  07/21/21 136/80   Pulse Readings from Last 3 Encounters:  08/11/21 (!) 102  08/11/21 96  07/21/21 85    /10  In general this is a well appearing African American male in no acute distress. He's alert and oriented x4 and appropriate throughout the examination. Cardiopulmonary assessment is negative for acute  distress, and he exhibits normal effort.     KPS = 90  100 - Normal; no complaints; no evidence of disease. 90   - Able to carry on normal activity; minor signs or symptoms of disease. 80   - Normal activity with effort; some signs or symptoms of disease. 6   - Cares for self; unable to carry on normal activity or to do active work. 60   - Requires occasional assistance, but is able to care for most of his personal needs. 50   - Requires considerable assistance and frequent medical care. 60   - Disabled; requires special care and assistance. 48   - Severely disabled; hospital admission is indicated although death not imminent. 52   - Very sick; hospital admission necessary; active supportive treatment necessary. 10   - Moribund; fatal processes progressing rapidly. 0     - Dead  Karnofsky DA, Abelmann [place], Craver LS and Burchenal JH [phone]) The use of the nitrogen mustards in the palliative treatment of carcinoma: with particular reference to bronchogenic carcinoma Cancer 1 [phone]  LABORATORY DATA:  Lab Results  Component Value Date   WBC 10.9 (H) 05/06/2018   HGB 11.5 (L) 05/06/2018   HCT 35.2 (L) 05/06/2018   MCV 86.1 05/06/2018   PLT 348 05/06/2018   Lab Results  Component Value Date   NA 141 12/16/2020   K 4.4 12/16/2020   CL 103 12/16/2020   CO2 21 12/16/2020   Lab Results  Component Value Date   ALT 7 12/16/2020   AST 17 12/16/2020   ALKPHOS 93 12/16/2020   BILITOT 0.4 12/16/2020     RADIOGRAPHY: NM Bone Scan Whole Body  Result Date: 08/01/2021 CLINICAL DATA:  Recent diagnosis of prostate cancer. Fell down some stairs 6 weeks ago with continued pain in the toes and feet. EXAM: NUCLEAR MEDICINE WHOLE BODY BONE SCAN  TECHNIQUE: Whole body anterior and posterior images were obtained approximately 3 hours after intravenous injection of radiopharmaceutical. RADIOPHARMACEUTICALS:  20.9 mCi Technetium-66mMDP IV COMPARISON:  There is no prior bone scan. Plain film  correlation is available with a left shoulder series from 03/03/2017, PA and lateral chest 05/06/2018 FINDINGS: There is mild activity in the nasal passages compatible with rhinitis. Mild activity consistent with DJD is noted at the [facility]. There is asymmetric activity at the left glenohumeral joint consistent with the prior plain film findings of asymmetric moderate osteophytosis. Additional degenerative type activity is noted medially in both knees. Additional mild activity most likely degenerative is seen in the lower cervical and upper lumbar spine. There is moderate asymmetric increased uptake in the area of the distal first, second and third right foot metatarsals versus metatarsophalangeal joints. This could be due to asymmetric degenerative change or recent trauma. No activity is seen in the axial and visualized appendicular skeleton suspicious for osteoblastic metastatic disease. IMPRESSION: 1. No scintigraphic findings suspicious for osteoblastic metastases. 2. Areas of degenerative type activity. 3. Activity in the right foot which could be in the distal first second and third metatarsals or at the MTP joints. This could be due to asymmetric DJD or healing trauma. Outside right foot films from 07/15/2021, with no report available, do not show evidence of fractures with moderate hallux valgus noted and moderate degenerative arthrosis of the first MTP joint. Tech notes indicate the patient's fall injury occurred before these films. Electronically Signed   By: KTelford NabM.D.   On: 08/01/2021 02:52   DG Foot Complete Right  Result Date: 07/24/2021 Please see detailed radiograph report in office note.     IMPRESSION/PLAN: 1. 66y.o. gentleman with Stage T1c adenocarcinoma of the prostate with Gleason Score of 5+5, and PSA of 17.6. We discussed the patient's workup and outlined the nature of prostate cancer in this setting. The patient's T stage, Gleason's score, and PSA put him into the very  high risk group.  We discussed the recommendation to obtain a PSMA PET scan to complete his disease staging for very high risk prostate cancer. Pending the PSMA PET scan confirms localized disease or oligometastatic disease only, he is eligible for a variety of potential treatment options including prostatectomy or LT-ADT in combination with either 8 weeks of external radiation or 5 weeks of external radiation with an upfront brachytherapy boost.  He is not an ideal candidate for robotic prostatectomy given his comorbidities and morbid obesity.  We discussed the available radiation techniques, and focused on the details and logistics of delivery.  Given his significant LUTS, we would not recommend brachytherapy boost as part of his treatment regimen.  Therefore, we discussed and outlined the risks, benefits, short and long-term effects associated with daily external beam radiotherapy and compared and contrasted these with prostatectomy. We discussed the role of SpaceOAR gel in reducing the rectal toxicity associated with radiotherapy. We also detailed the role of ADT in the treatment of high risk prostate cancer and outlined the associated side effects that could be expected with this therapy.  He was encouraged to ask questions that were answered to his stated satisfaction.  At the conclusion of our conversation, pending the PSMA PET scan confirms localized disease or oligometastatic disease only, he is interested in moving forward with 8 weeks of external beam therapy, concurrent with LT-ADT. He has not yet started ADT so we will share our discussion with Dr. SDiamantina Providenceand make arrangements for a  follow-up visit, first available, to start ADT now.  He did not tolerate the prostate biopsy very well and wishes to defer fiducial marker placement which is reasonable.  Once we have a date for the start of ADT, we will coordinate for CT simulation in April 2023.  The patient appears to have a good understanding of his  disease and our treatment recommendations which are of curative intent and is in agreement with the stated plan.  Therefore, we will move forward with treatment planning accordingly, in anticipation of beginning IMRT in April 2023, approximately 2 months after starting ADT.   We personally spent 70 minutes in this encounter including chart review, reviewing radiological studies, meeting face-to-face with the patient, entering orders and completing documentation.    Matthew Johns, PA-C    Tyler Pita, MD  [facility] Direct Dial: [phone]   Fax: [phone] [email or web address]   Skype   [email or web address]   This document serves as a record of services personally performed by Tyler Pita, MD and Freeman Caldron, PA-C. It was created on their behalf by Wilburn Mylar, a trained medical scribe. The creation of this record is based on the scribe's personal observations and the provider's statements to them. This document has been checked and approved by the attending provider.

## 2021-08-11 NOTE — Progress Notes (Signed)
Chief Complaint: Constipation and screening for colorectal cancer  HPI:    [name] is a 66 year old African-American male with a past medical history of diabetes and recent diagnosis of prostate cancer and others listed below, who was referred to me by Ladell Pier, MD for a complaint of constipation and screening for colorectal cancer.    Today, the patient tells me that he previously had a colonoscopy set up last year but then his insurance would not cover it due to where it was.  He is trying to get his screening colonoscopy taking care of now.  Denies any family history of colon cancer or rectal bleeding.    Also discusses chronic constipation.  Apparently takes a Senokot and a stool softener almost every day or every other day depending on if he eats a lot or not.  If he does take these medicines he is able to have a bowel movement but without them he is not.  He has been doing this for the past few years and would appreciate being on a medicine that is not a stimulant.    Denies fever, chills, weight loss, abdominal pain or symptoms that awaken him from sleep.  Past Medical History:  Diagnosis Date   Allergy    Foot pain, bilateral    referred to podiatry 06/2014   GERD (gastroesophageal reflux disease)    long history of   Hyperlipidemia    Hypertension    Polyarthralgia    shoulders, knees, ankles, wrists; neg rheum lab screen 04/2014   Type 2 diabetes mellitus with diabetic neuropathy ([place])    Wears glasses     Past Surgical History:  Procedure Laterality Date   COLONOSCOPY     referral pending 03/2015   NO PAST SURGERIES     as of 03/2015    Current Outpatient Medications  Medication Sig Dispense Refill   atorvastatin (LIPITOR) 80 MG tablet Take 1 tablet (80 mg total) by mouth daily. 90 tablet 3   BINAXNOW COVID-19 AG HOME TEST KIT Use as Directed on the Package     Continuous Blood Gluc Sensor (FREESTYLE LIBRE SENSOR SYSTEM) MISC Change sensor Q 2 wks 2  each 12   COVID-19 mRNA Vac-TriS, Pfizer, SUSP injection Inject into the muscle. 0.3 mL 0   diazepam (VALIUM) 10 MG tablet Take 1 tablet (10 mg total) by mouth once as needed for up to 1 dose for anxiety (take 30 minutes prior to prostate biopsy). 1 tablet 0   fluticasone (FLONASE) 50 MCG/ACT nasal spray Place 1 spray into both nostrils daily. 16 g 2   gabapentin (NEURONTIN) 600 MG tablet Take 1 tablet (600 mg total) by mouth 3 (three) times daily. 90 tablet 6   metFORMIN (GLUCOPHAGE-XR) 500 MG 24 hr tablet Take 1 tablet by mouth once daily with breakfast 90 tablet 0   olmesartan (BENICAR) 20 MG tablet Take 1 tablet (20 mg total) by mouth daily. 90 tablet 1   pantoprazole (PROTONIX) 20 MG tablet Take 1 tablet (20 mg total) by mouth daily. 90 tablet 1   pregabalin (LYRICA) 75 MG capsule Take 1 capsule (75 mg total) by mouth 2 (two) times daily. 60 capsule 0   senna (SENOKOT) 8.6 MG tablet Take 1 tablet (8.6 mg total) by mouth daily as needed for constipation. 30 tablet 3   sildenafil (VIAGRA) 50 MG tablet Take 1-2 tablets (50-100 mg total) by mouth daily as needed for erectile dysfunction. 15 tablet 11   traMADol (  ULTRAM) 50 MG tablet Take 1 tablet (50 mg total) by mouth every 8 (eight) hours as needed (each prescription to last 1 mth). 80 tablet 2   No current facility-administered medications for this visit.    Allergies as of 08/11/2021   (No Known Allergies)    Family History  Problem Relation Age of Onset   Diabetes Mother    Hypertension Mother    Other Father        murdered   Diabetes Sister    Arthritis Sister    Heart disease Sister    Diabetes Sister    Diabetes Sister    Cancer Neg Hx    Stroke Neg Hx    Prostate cancer Neg Hx        unknown   Bladder Cancer Neg Hx        unkown   Kidney cancer Neg Hx        unkown    Social History   Socioeconomic History   Marital status: Divorced    Spouse name: Not on file   Number of children: Not on file   Years of  education: Not on file   Highest education level: Not on file  Occupational History   Not on file  Tobacco Use   Smoking status: Former    Types: Cigarettes   Smokeless tobacco: Never  Vaping Use   Vaping Use: Never used  Substance and Sexual Activity   Alcohol use: Yes    Comment: occasional beer   Drug use: No   Sexual activity: Yes  Other Topics Concern   Not on file  Social History Narrative   Lives with his sister.   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   Exercise - active on the job.  Has 3 children in [place].   Social Determinants of Health   Financial Resource Strain: Not on file  Food Insecurity: Not on file  Transportation Needs: Not on file  Physical Activity: Not on file  Stress: Not on file  Social Connections: Not on file  Intimate Partner Violence: Not on file    Review of Systems:    Constitutional: No weight loss, fever or chills Skin: No rash  Cardiovascular: No chest pain  Respiratory: No SOB  Gastrointestinal: See HPI and otherwise negative Genitourinary: No dysuria  Neurological: No headache, dizziness or syncope Musculoskeletal: No new muscle or joint pain Hematologic: No bleeding  Psychiatric: No history or depression or anxiety   Physical Exam:  Vital signs: BP 116/68 (BP Location: Left Arm, Patient Position: Sitting, Cuff Size: Large)    Pulse 96    Ht 5' 8.5" (1.74 m) Comment: height measured without shoes   Wt 289 lb 8 oz (131.3 kg)    BMI 43.38 kg/m    Constitutional:   Pleasant AA male appears to be in NAD, Well developed, Well nourished, alert and cooperative Head:  Normocephalic and atraumatic. Eyes:   PEERL, EOMI. No icterus. Conjunctiva pink. Ears:  Normal auditory acuity. Neck:  Supple Throat: Oral cavity and pharynx without inflammation, swelling or lesion.  Respiratory: Respirations even and unlabored. Lungs clear to auscultation bilaterally.   No wheezes, crackles, or rhonchi.  Cardiovascular: Normal S1, S2. No MRG. Regular  rate and rhythm. No peripheral edema, cyanosis or pallor.  Gastrointestinal:  Soft, nondistended, nontender. No rebound or guarding. Normal bowel sounds. No appreciable masses or hepatomegaly. Rectal:  Not performed.  Msk:  Symmetrical without gross deformities. Without edema, no deformity or joint  abnormality.  Neurologic:  Alert and  oriented x4;  grossly normal neurologically.  Skin:   Dry and intact without significant lesions or rashes. Psychiatric:  Demonstrates good judgement and reason without abnormal affect or behaviors.  No recent labs or imaging.  Assessment: 1.  Screening for colorectal cancer: Patient has never had screening for colorectal cancer and would like a colonoscopy 2.  Chronic constipation: For the past 2 years, uses Senokot and a stool softener at home, but would like to try something else; likely slow transit  Plan: 1.   Scheduled patient for screening colonoscopy in the [facility] with Dr. Silverio Decamp.  Did provide the patient a detailed list of risks for the procedure and he agrees to proceed. Patient is appropriate for endoscopic procedure(s) in the ambulatory ([facility]) setting.  2.  Started the patient on Linzess 72 mcg daily.  Prescribed #30 with 5 refills.  Also provided the patient with samples. 3.  Recommend the patient have a 2-day bowel prep given history of chronic constipation 4.  Patient follow in clinic per recommendations after time of procedure.  Ellouise Newer, PA-C [facility] 08/11/2021, 8:42 AM  Cc: Ladell Pier, MD

## 2021-08-11 NOTE — Patient Instructions (Signed)
PROCEDURES: You have been scheduled for a colonoscopy. Please follow the written instructions given to you at your visit today. Please pick up your prep supplies at the pharmacy within the next 1-3 days. If you use inhalers (even only as needed), please bring them with you on the day of your procedure.  MEDICATION: We have sent the following medication to your pharmacy for you to pick up at your convenience: Linzess 72 mcg, take 1 capsule in the morning before breakfast. We have provided you some samples today.   BMI:  If you are age 79 or older, your body mass index should be between 23-30. Your Body mass index is 43.38 kg/m. If this is out of the aforementioned range listed, please consider follow up with your Primary Care Provider.  If you are age 68 or younger, your body mass index should be between 19-25. Your Body mass index is 43.38 kg/m. If this is out of the aformentioned range listed, please consider follow up with your Primary Care Provider.   MY CHART:  The [facility] providers would like to encourage you to use [facility] to communicate with providers for non-urgent requests or questions.  Due to long hold times on the telephone, sending your provider a message by [facility] may be a faster and more efficient way to get a response.  Please allow 48 business hours for a response.  Please remember that this is for non-urgent requests.   Thank you for trusting me with your gastrointestinal care!    Ellouise Newer, [place]

## 2021-08-11 NOTE — Progress Notes (Signed)
Introduced myself to the patient as the prostate nurse navigator.  No barriers to care identified at this time.  He is wanting to have his treatment in [place] due to transportation.  Patient reports he has a car, however, just did not want to commute to [facility] for daily treatment.   He is here to discuss his radiation treatment options.  I gave him my business card and asked him to call me with questions or concerns.  Verbalized understanding.

## 2021-08-17 ENCOUNTER — Telehealth: Payer: Self-pay | Admitting: Physician Assistant

## 2021-08-17 NOTE — Telephone Encounter (Signed)
Inbound call from patient states he tried the Linzess samples taking 1 at a time. States he had to take 2 for the medication to work. Reports he have not picked up the prescription but asking if the dosage can be changed to 2x a day

## 2021-08-17 NOTE — Telephone Encounter (Signed)
Please advise 

## 2021-08-18 ENCOUNTER — Telehealth: Payer: 59 | Admitting: Urology

## 2021-08-19 ENCOUNTER — Other Ambulatory Visit (HOSPITAL_COMMUNITY): Payer: 59

## 2021-08-19 ENCOUNTER — Ambulatory Visit: Payer: 59 | Admitting: Urology

## 2021-08-19 MED ORDER — LINACLOTIDE 145 MCG PO CAPS: 145.0000 ug | ORAL_CAPSULE | Freq: Every day | ORAL | 1 refills | Status: DC

## 2021-08-20 ENCOUNTER — Ambulatory Visit: Payer: 59 | Admitting: Urology

## 2021-08-20 ENCOUNTER — Other Ambulatory Visit: Payer: Self-pay

## 2021-08-20 ENCOUNTER — Encounter: Payer: Self-pay | Admitting: Urology

## 2021-08-20 VITALS — BP 141/85 | HR 98 | Ht 71.0 in | Wt 287.0 lb

## 2021-08-20 DIAGNOSIS — C61 Malignant neoplasm of prostate: Secondary | ICD-10-CM

## 2021-08-20 MED ORDER — LEUPROLIDE ACETATE (6 MONTH) 45 MG ~~LOC~~ KIT
45.0000 mg | PACK | Freq: Once | SUBCUTANEOUS | Status: AC
  Administered 2021-08-20: 45 mg via SUBCUTANEOUS

## 2021-08-20 NOTE — Progress Notes (Signed)
° °  08/20/2021 12:59 PM   Matthew Colon Sr. 1956/03/24 [id number]  Reason for visit: Discuss treatment plan for prostate cancer, ADT initiation  HPI: 66 year old male with elevated PSA of 17.6, and prostate MRI in September showed diffuse abnormal signal worrisome for possible diffuse prostate neoplasm.  He lives in [place] and has significant problems with transportation, and unfortunately the urology group in [place] does not take his insurance so he has been coming to [place] at [facility] for visits.  He no showed multiple prostate biopsy appointments, however after extensive conversations over the phone he ultimately underwent a prostate biopsy on 07/03/2021.  Prostate biopsy showed a 30 g prostate with extensive high-grade prostate cancer throughout.  All 12 cores were positive for prostate acinar adenocarcinoma, primarily grade group 5 disease, and max core involvement of 100%.  Prostate MRI showed possible extraprostatic extension beyond the capsule at the prostate base, but no evidence of pelvic metastatic disease or bone lesion in the pelvis.  Bone scan was negative for metastatic disease.  I personally viewed and interpreted those images.  He ultimately opted for 2 years of ADT with tentative plan for external beam radiation with Dr. Tammi Klippel in [place].  He has a PSMA PET scan that is scheduled for 08/24/2021.  45-month Eligard injection given today, we discussed the need for 2 years of ADT total with his high risk disease.  Risks and benefits discussed extensively, including risk of weight gain, hot flashes, depression/mood swings, ED, decreased libido, fatigue.  Keep scheduled follow-up for PSMA PET scan, and initiation of external beam radiation with Dr. Tammi Klippel in [place] RTC 6 months to continue Eligard(2 years total), and PSA check  Billey Co, MD

## 2021-08-20 NOTE — Patient Instructions (Addendum)
Please begin taking vitamin d (800 - 1000iu) and calcium (1000 - 1200mg ) daily for the next 2 years while receiving ADT injections    Hormone Suppression Therapy for Prostate Cancer Hormone suppression therapy is a treatment for prostate cancer that can help slow the growth of cancer cells in the prostate gland. It is also called androgen deprivation therapy (ADT) or androgen suppression therapy. Hormone suppression therapy targets male sex hormones (androgens) in the body that help cancer cells grow. Hormone suppression therapy alone will not cure prostate cancer, but it can slow the growth of cancer cells and may shrink tumors over time. Your health care provider can help you find the best treatment that fits your lifestyle. Hormone suppression therapy may be used in the following cases: When prostate cancer has spread too far to other places in the body and cannot be cured by surgery or radiation. When a person has health problems that prevent the use of surgery or radiation. Before radiation to help shrink the size of the cancer and make the radiation treatment more effective. If the prostate cancer remains or comes back following treatment with surgery or radiation. What are the types of hormone suppression therapy? Orchiectomy Orchiectomy, also called surgical castration, is a surgery to remove one or both testicles. The testicles make the two main androgens--testosterone and dihydrotestosterone (DHT). This surgery reduces the levels of testosterone in the blood, leading to decreased androgen production. Medicine therapy Medicine therapy, also called medical or chemical castration, involves taking medicines to keep your body from making or using androgens. Medicines can do this in one of three ways: Reducing androgen production by the testicles. Luteinizing hormone-releasing hormone [facility]) agonists. These medicines are injected or implanted under your skin to lower the amount of androgens that  your testicles make. Depending on the medicine, they can be given monthly or up to every 3 to 6 months. If you take these medicines, you may also be prescribed other medicines to help with side effects. LHRH antagonists. These medicines also work to lower the amount of androgens made in the testicles, but they work faster than [facility] agonist medicines and have less severe side effects. They are given as a monthly injection under the skin, and they are used when prostate cancer is in an advanced stage. Estrogens. These medicines are male hormones that help to reduce androgen production by the testicles. Estrogens are not used as commonly as other types of hormone suppression therapy due to their side effects. However, they may be used if other treatments do not work. Blocking androgen attachment throughout the body. Anti-androgen medicines, also called androgen receptor antagonists, block areas on the body where androgens attach. These are pills that are usually used in combination with other types of hormone suppression therapy, like orchiectomy and other medicines. Blocking androgen production throughout the body. Androgen synthesis inhibitor medicines. These medicines help to stop other areas of the body from making androgens. They are taken as pills. They may be used if the prostate cancer is advanced and has not gotten better with surgery or other medicines. A steroid medicine may be given with this type of medicine to help with side effects. What are the risks? Hormone suppression therapy may cause side effects, including: Hot flashes. Diarrhea and nausea. Itching. Sexual side effects, such as: Decrease or lack of sexual desire. Decrease in size of the penis or testicles. Inability to get an erection (erectile dysfunction, or impotence). Breast tenderness or increase in breast size. Fatigue. Weight gain. Anemia.  Thinning of the bones (osteoporosis) and loss of muscle mass. Depression, mood  swings, and trouble with thinking or focusing. Hormone suppression therapy may also increase your risk of high blood pressure, increased cholesterol levels, stroke, heart attack, or diabetes. What are the benefits? One of the main benefits of hormone suppression therapy is having additional treatment options. You may have only one type of treatment, or two or more types at the same time. Treatments may be combined to: Help with side effects. Treat advanced cancer. Where to find more information American Cancer Society: www.cancer.Netarts: www.cancer.gov Contact a health care provider if: You have pain or side effects that do not get better with treatment. You have trouble urinating. You have new side effects that do not go away. Get help right away if: You have severe chest pain. You have trouble breathing. You have an irregular heartbeat. You have numbness or paralysis in the lower half of your body. You are confused. You have trouble talking or understanding speech. These symptoms may be an emergency. Do not wait to see if the symptoms will go away. Get medical help right away. Call your local emergency services (911 in the U.S.). Do not drive yourself to the hospital. Summary Hormone suppression therapy is a treatment for prostate cancer that can help to slow the growth of cancer cells in the prostate gland. Hormone suppression therapy alone will not cure prostate cancer, but it can slow the growth of prostate cancer and may shrink tumors over time. Treatment to suppress hormones may include surgery or medicines. Side effects such as hot flashes, changes in sexual function or desire, and weakened bones can result from hormone suppression therapy. This information is not intended to replace advice given to you by your health care provider. Make sure you discuss any questions you have with your health care provider. Document Revised: 10/02/2020 Document Reviewed:  10/02/2020 Elsevier Patient Education  2022 [place] The prostate is a small gland that helps make semen. It is located below a man's bladder, in front of the rectum. Prostate cancer is when abnormal cells grow in this gland. What are the causes? The cause of this condition is not known. What increases the risk? Being age 63 or older. Having a family history of prostate cancer. Having a family history of cancer of the breasts or ovaries. Having genes that are passed from parent to child (inherited). Having Lynch syndrome. African American men and men of African descent are diagnosed with prostate cancer at higher rates than other men. What are the signs or symptoms? Problems peeing (urinating). This may include: A stream that is weak, or pee that stops and starts. Trouble starting or stopping your pee. Trouble emptying all of your pee. Needing to pee more often, especially at night. Blood in your pee or semen. Pain in the: Lower back. Lower belly (abdomen). Hips. Trouble getting an erection. Weakness or numbness in the legs or feet. How is this treated? Treatment for this condition depends on: How much the cancer has spread. Your age. The kind of treatment you want. Your health. Treatments include: Being watched. This is called observation. You will be tested from time to time, but you will not get treated. Tests are to make sure that the cancer is not growing. Surgery. This may be done to: Take out (remove) the prostate. Freeze and kill cancer cells. Radiation. This uses a strong beam of energy to kill cancer cells. Chemotherapy. This uses  medicines that stop cancer cells from increasing. This kills cancer cells and healthy cells. Targeted therapy. This kills cancer cells only. Healthy cells are not affected. Hormone treatment. This stops the body from making hormones that help the cancer cells grow. Follow these instructions at home: Lifestyle Do  not smoke or use any products that contain nicotine or tobacco. If you need help quitting, ask your doctor. Eat a healthy diet. Treatment may affect your ability to have sex. If you have a partner, touch, hold, hug, and caress your partner to have intimate moments. Get plenty of sleep. Ask your doctor for help to find a support group for men with prostate cancer. General instructions Take over-the-counter and prescription medicines only as told by your doctor. If you have to go to the hospital, let your cancer doctor (oncologist) know. Keep all follow-up visits. Where to find more information American Cancer Society: www.cancer.[place] of Clinical Oncology: www.cancer.net [employer]: www.cancer.gov Contact a doctor if: You have new or more trouble peeing. You have new or more blood in your pee. You have new or more pain in your hips, back, or chest. Get help right away if: You have weakness in your legs. You lose feeling in your legs. You cannot control your pee or your poop (stool). You have chills or a fever. Summary The prostate is a male gland that helps make semen. Prostate cancer is when abnormal cells grow in this gland. Treatment includes doing surgery, using medicines, using strong beams of energy, or watching without treatment. Ask your doctor for help to find a support group for men with prostate cancer. Contact a doctor if you have problems peeing or have any new pain that you did not have before. This information is not intended to replace advice given to you by your health care provider. Make sure you discuss any questions you have with your health care provider. Document Revised: 09/17/2020 Document Reviewed: 09/17/2020 Elsevier Patient Education  [place].

## 2021-08-20 NOTE — Progress Notes (Signed)
Eligard SubQ Injection   Due to Prostate Cancer patient is present today for a Eligard Injection.  Medication: Eligard 6 month Dose: 45 mg  Location: right  Lot: [id number] Exp: 01/03/23  Patient tolerated well, no complications were noted  Performed by: Edwin Dada, [place]  Per Dr. Diamantina Providence patient is to continue therapy for 2 years . Patient's next follow up was scheduled for 02/25/2022. This appointment was scheduled using wheel and given to patient today along with reminder continue on Vitamin D 800-1000iu and Calcium 1000-1200mg  daily while on Androgen Deprivation Therapy.

## 2021-08-24 ENCOUNTER — Other Ambulatory Visit: Payer: Self-pay

## 2021-08-24 ENCOUNTER — Ambulatory Visit (HOSPITAL_COMMUNITY)
Admission: RE | Admit: 2021-08-24 | Discharge: 2021-08-24 | Disposition: A | Discharge status: Home / Self Care | Payer: 59 | Source: Ambulatory Visit | Attending: Urology | Admitting: Urology

## 2021-08-24 DIAGNOSIS — C61 Malignant neoplasm of prostate: Secondary | ICD-10-CM | POA: Insufficient documentation

## 2021-08-24 MED ORDER — PIFLIFOLASTAT F 18 (PYLARIFY) INJECTION
9.0000 | Freq: Once | INTRAVENOUS | Status: AC
  Administered 2021-08-24: 9.09 via INTRAVENOUS

## 2021-08-26 NOTE — Progress Notes (Signed)
Reviewed and agree with documentation and assessment and plan. K. Veena Fleta Borgeson , MD   

## 2021-08-28 ENCOUNTER — Encounter (HOSPITAL_COMMUNITY): Payer: Self-pay

## 2021-08-28 ENCOUNTER — Other Ambulatory Visit (HOSPITAL_COMMUNITY): Payer: 59

## 2021-08-31 ENCOUNTER — Encounter: Payer: Self-pay | Admitting: Urology

## 2021-08-31 NOTE — Progress Notes (Signed)
He started ADT on 2/16, and is scheduled for CT Sim on October 19, 2021. We will call to let him know that this scan shows locally advanced disease only with involvement of a few pelvic lymph nodes and a small retroperitoneal node in the lower abdomen but no diffuse disease or skeletal involvement so we will proceed with treatment planning accordingly in anticipation of beginning his daily radiation treatments in April, approximately 2 months after starting ADT.  Nicholos Johns, MMS, PA-C [facility] [place]: [phone]   Fax: [phone]

## 2021-08-31 NOTE — Progress Notes (Signed)
Left voicemail for patient to call back, Re: results.

## 2021-09-01 ENCOUNTER — Telehealth: Payer: Self-pay

## 2021-09-01 NOTE — Telephone Encounter (Signed)
-----   Message from [place], [place] sent at 08/31/2021 12:02 PM EST ----- Will you please call Matthew Colon to let him know that the PET scan shows only locally advanced disease with involvement of a few nearby lymph nodes but no widespread disease or disease in the skeleton. This is good news, so we will proceed as planned with the LT-ADT concurrent with 8 weeks of prostate IMRT. He got his 6 month Eligard with Dr. Diamantina Providence on 08/21/21 and is scheduled for CT SIM in 10/2021 so it looks like everything is on track! Thank you!!! -Lia Foyer

## 2021-09-01 NOTE — Telephone Encounter (Signed)
RN notified of patient of PET scan results, verbalized understanding and agreement.  Pt did receive ADT on 2/17.  RN reviewed CT Sim appointment and what to expect.  All questions answered, no further needs at this time.

## 2021-09-03 ENCOUNTER — Other Ambulatory Visit: Payer: Self-pay | Admitting: Internal Medicine

## 2021-09-03 ENCOUNTER — Other Ambulatory Visit: Payer: Self-pay | Admitting: Urology

## 2021-09-03 DIAGNOSIS — C61 Malignant neoplasm of prostate: Secondary | ICD-10-CM

## 2021-09-03 DIAGNOSIS — I152 Hypertension secondary to endocrine disorders: Secondary | ICD-10-CM

## 2021-09-03 DIAGNOSIS — E1159 Type 2 diabetes mellitus with other circulatory complications: Secondary | ICD-10-CM

## 2021-09-03 NOTE — Telephone Encounter (Signed)
Per last office visit note- continue olmesartan ?Requested Prescriptions  ?Pending Prescriptions Disp Refills  ?? olmesartan (BENICAR) 20 MG tablet [Pharmacy Med Name: Olmesartan Medoxomil 20 MG Oral Tablet] 90 tablet 0  ?  Sig: Take 1 tablet by mouth once daily  ?  ? Cardiovascular:  Angiotensin Receptor Blockers Failed - 09/03/2021  9:10 AM  ?  ?  Failed - Cr in normal range and within 180 days  ?  Creatinine  ?Date Value Ref Range Status  ?11/13/2020 142.3 20.0 - 300.0 mg/dL Final  ? ?Creat  ?Date Value Ref Range Status  ?12/05/2015 1.18 0.70 - 1.33 mg/dL Final  ? ?Creatinine, Ser  ?Date Value Ref Range Status  ?12/16/2020 1.19 0.76 - 1.27 mg/dL Final  ?   ?  ?  Failed - K in normal range and within 180 days  ?  Potassium  ?Date Value Ref Range Status  ?12/16/2020 4.4 3.5 - 5.2 mmol/L Final  ?   ?  ?  Failed - Last BP in normal range  ?  BP Readings from Last 1 Encounters:  ?08/20/21 (!) 141/85  ?   ?  ?  Passed - Patient is not pregnant  ?  ?  Passed - Valid encounter within last 6 months  ?  Recent Outpatient Visits   ?      ? 1 month ago Type 2 diabetes mellitus with peripheral neuropathy ([place])  ? [place] Ladell Pier, MD  ? 4 months ago Need for influenza vaccination  ? [place], RPH-CPP  ? 5 months ago Type 2 diabetes mellitus with peripheral neuropathy ([place])  ? [place] Ladell Pier, MD  ? 7 months ago Hypertension associated with diabetes [facility])  ? [place], RPH-CPP  ? 8 months ago Hypertension associated with diabetes ([place])  ? [place], RPH-CPP  ?  ?  ?Future Appointments   ?        ? In 2 months Ladell Pier, MD [place]  ?  ? ?  ?  ?  ? ?

## 2021-09-04 ENCOUNTER — Telehealth: Payer: Self-pay | Admitting: Oncology

## 2021-09-04 NOTE — Telephone Encounter (Signed)
Scheduled appt per 3/2 referral. Pt is aware of appt date and time. Pt is aware to arrive 15 mins prior to appt time and to bring and updated insurance card. Pt is aware of appt location.   ?

## 2021-09-18 ENCOUNTER — Other Ambulatory Visit: Payer: Self-pay | Admitting: Podiatry

## 2021-09-18 ENCOUNTER — Other Ambulatory Visit: Payer: Self-pay | Admitting: Internal Medicine

## 2021-09-18 DIAGNOSIS — K219 Gastro-esophageal reflux disease without esophagitis: Secondary | ICD-10-CM

## 2021-09-18 DIAGNOSIS — E1142 Type 2 diabetes mellitus with diabetic polyneuropathy: Secondary | ICD-10-CM

## 2021-09-23 ENCOUNTER — Other Ambulatory Visit: Payer: Self-pay | Admitting: *Deleted

## 2021-09-23 MED ORDER — PREGABALIN 75 MG PO CAPS: 75.0000 mg | ORAL_CAPSULE | Freq: Two times a day (BID) | ORAL | 0 refills | Status: DC

## 2021-09-24 ENCOUNTER — Other Ambulatory Visit: Payer: Self-pay | Admitting: *Deleted

## 2021-09-24 MED ORDER — PREGABALIN 75 MG PO CAPS: 75.0000 mg | ORAL_CAPSULE | Freq: Two times a day (BID) | ORAL | 0 refills | Status: DC

## 2021-09-24 NOTE — Telephone Encounter (Signed)
Patient is wanting the Lyrica sent to another pharmacy, [facility]. ?

## 2021-09-28 ENCOUNTER — Other Ambulatory Visit: Payer: Self-pay | Admitting: Podiatry

## 2021-09-28 ENCOUNTER — Telehealth: Payer: Self-pay | Admitting: Podiatry

## 2021-09-28 MED ORDER — PREGABALIN 75 MG PO CAPS: 75.0000 mg | ORAL_CAPSULE | Freq: Two times a day (BID) | ORAL | 0 refills | Status: DC

## 2021-09-28 NOTE — Telephone Encounter (Signed)
Pt notified and said thank you ?

## 2021-09-28 NOTE — Telephone Encounter (Signed)
Prescription sent.  Thanks, Dr. Amalia Hailey

## 2021-09-28 NOTE — Telephone Encounter (Signed)
Pt needs refill of his lyrica sent to the [facility] on [address]. He has been trying to get this over a week now. It keeps showing that it was printed not electronically sent. Pt states he is in a lot of pain ?

## 2021-09-29 ENCOUNTER — Telehealth: Payer: Self-pay | Admitting: *Deleted

## 2021-09-29 ENCOUNTER — Inpatient Hospital Stay: Payer: 59 | Attending: Oncology | Admitting: Oncology

## 2021-09-29 NOTE — Telephone Encounter (Signed)
PA submitted via Covermymeds for Linzess.  

## 2021-09-30 ENCOUNTER — Other Ambulatory Visit: Payer: Self-pay

## 2021-09-30 ENCOUNTER — Encounter: Payer: Self-pay | Admitting: Gastroenterology

## 2021-09-30 ENCOUNTER — Ambulatory Visit (AMBULATORY_SURGERY_CENTER): Payer: 59 | Admitting: Gastroenterology

## 2021-09-30 VITALS — BP 138/82 | HR 82 | Temp 96.2°F | Resp 13 | Ht 68.0 in | Wt 289.0 lb

## 2021-09-30 DIAGNOSIS — D123 Benign neoplasm of transverse colon: Secondary | ICD-10-CM

## 2021-09-30 DIAGNOSIS — Z1211 Encounter for screening for malignant neoplasm of colon: Secondary | ICD-10-CM | POA: Diagnosis present

## 2021-09-30 MED ORDER — SODIUM CHLORIDE 0.9 % IV SOLN: 500.0000 mL | Freq: Once | INTRAVENOUS | Status: DC

## 2021-09-30 NOTE — Op Note (Signed)
[place] ?Patient Name: Matthew Colon ?Procedure Date: 09/30/2021 8:35 AM ?MRN: [id number] ?Endoscopist: Mauri Pole , MD ?Age: 66 ?Referring MD:  ?Date of Birth: 04-Jan-1956 ?Gender: Male ?Account #: [id number] ?Procedure:                Colonoscopy ?Indications:              Screening for colorectal malignant neoplasm ?Medicines:                Monitored Anesthesia Care ?Procedure:                Pre-Anesthesia Assessment: ?                          - Prior to the procedure, a History and Physical  ?                          was performed, and patient medications and  ?                          allergies were reviewed. The patient's tolerance of  ?                          previous anesthesia was also reviewed. The risks  ?                          and benefits of the procedure and the sedation  ?                          options and risks were discussed with the patient.  ?                          All questions were answered, and informed consent  ?                          was obtained. Prior Anticoagulants: The patient has  ?                          taken no previous anticoagulant or antiplatelet  ?                          agents. ASA Grade Assessment: III - A patient with  ?                          severe systemic disease. After reviewing the risks  ?                          and benefits, the patient was deemed in  ?                          satisfactory condition to undergo the procedure. ?                          After obtaining informed consent, the colonoscope  ?  was passed under direct vision. Throughout the  ?                          procedure, the patient's blood pressure, pulse, and  ?                          oxygen saturations were monitored continuously. The  ?                          Olympus [id number]-[id number] (#[id number]) Colonoscope was  ?                          introduced through the anus and advanced to the the  ?                          cecum,  identified by appendiceal orifice and  ?                          ileocecal valve. The colonoscopy was performed  ?                          without difficulty. The patient tolerated the  ?                          procedure well. The quality of the bowel  ?                          preparation was good. The ileocecal valve,  ?                          appendiceal orifice, and rectum were photographed. ?Scope In: 8:45:53 AM ?Scope Out: 9:06:10 AM ?Scope Withdrawal Time: 0 hours 13 minutes 7 seconds  ?Total Procedure Duration: 0 hours 20 minutes 17 seconds  ?Findings:                 The perianal and digital rectal examinations were  ?                          normal. ?                          Scattered small and large-mouthed diverticula were  ?                          found in the sigmoid colon, descending colon and  ?                          ascending colon. ?                          External and internal hemorrhoids were found during  ?                          retroflexion. The hemorrhoids were medium-sized. ?  A less than 1 mm polyp was found in the transverse  ?                          colon. The polyp was sessile. The polyp was removed  ?                          with a cold biopsy forceps. Resection and retrieval  ?                          were complete. ?                          The exam was otherwise normal throughout the  ?                          examined colon. ?Complications:            No immediate complications. ?Estimated Blood Loss:     Estimated blood loss was minimal. ?Impression:               - Moderate diverticulosis in the sigmoid colon, in  ?                          the descending colon and in the ascending colon. ?                          - External and internal hemorrhoids. ?                          - One less than 1 mm polyp in the transverse colon,  ?                          removed with a cold biopsy forceps. Resected and  ?                           retrieved. ?Recommendation:           - Patient has a contact number available for  ?                          emergencies. The signs and symptoms of potential  ?                          delayed complications were discussed with the  ?                          patient. Return to normal activities tomorrow.  ?                          Written discharge instructions were provided to the  ?                          patient. ?                          - Resume previous  diet. ?                          - Continue present medications. ?                          - Await pathology results. ?                          - Repeat colonoscopy in 5-10 years for surveillance  ?                          based on pathology results. ?Mauri Pole, MD ?09/30/2021 9:11:15 AM ?This report has been signed electronically. ?

## 2021-09-30 NOTE — Progress Notes (Signed)
[facility] History and Physical ? ? ?Primary Care Physician:  Ladell Pier, MD ? ? ?Reason for Procedure:  Colorectal cancer screening ? ?Plan:    Screening colonoscopy with possible interventions as needed ? ? ? ? ?HPI: Renato Spellman Sr. is a very pleasant 66 y.o. male here for screening colonoscopy. ?Denies any nausea, vomiting, abdominal pain, melena or bright red blood per rectum ? ?The risks and benefits as well as alternatives of endoscopic procedure(s) have been discussed and reviewed. All questions answered. The patient agrees to proceed. ? ? ? ?Past Medical History:  ?Diagnosis Date  ? Allergy   ? Arthritis   ? DM (diabetes mellitus) ([place])   ? Foot pain, bilateral   ? referred to podiatry 06/2014  ? GERD (gastroesophageal reflux disease)   ? long history of  ? Hyperlipidemia   ? Hypertension   ? Polyarthralgia   ? shoulders, knees, ankles, wrists; neg rheum lab screen 04/2014  ? Prostate cancer ([place])   ? Type 2 diabetes mellitus with diabetic neuropathy ([facility])   ? Wears glasses   ? ? ?Past Surgical History:  ?Procedure Laterality Date  ? COLONOSCOPY    ? referral pending 03/2015  ? NO PAST SURGERIES    ? as of 03/2015  ? ? ?Prior to Admission medications   ?Medication Sig Start Date End Date Taking? Authorizing Provider  ?atorvastatin (LIPITOR) 80 MG tablet Take 1 tablet (80 mg total) by mouth daily. 11/13/20  Yes Ladell Pier, MD  ?Continuous Blood Gluc Sensor (FREESTYLE LIBRE SENSOR SYSTEM) MISC Change sensor Q 2 wks 07/21/21  Yes Ladell Pier, MD  ?fluticasone (FLONASE) 50 MCG/ACT nasal spray Place 1 spray into both nostrils daily. 11/13/20  Yes Ladell Pier, MD  ?linaclotide [facility]) 145 MCG CAPS capsule Take 1 capsule (145 mcg total) by mouth daily before breakfast. 08/19/21  Yes Levin Erp, PA  ?metFORMIN (GLUCOPHAGE-XR) 500 MG 24 hr tablet Take 1 tablet by mouth once daily with breakfast 09/18/21  Yes Ladell Pier, MD  ?olmesartan (BENICAR) 20 MG  tablet Take 1 tablet by mouth once daily 09/03/21  Yes Ladell Pier, MD  ?pantoprazole (PROTONIX) 20 MG tablet Take 1 tablet by mouth once daily 09/18/21  Yes Ladell Pier, MD  ?senna (SENOKOT) 8.6 MG tablet Take 1 tablet (8.6 mg total) by mouth daily as needed for constipation. 07/21/21  Yes Ladell Pier, MD  ?traMADol (ULTRAM) 50 MG tablet Take 1 tablet (50 mg total) by mouth every 8 (eight) hours as needed (each prescription to last 1 mth). 07/21/21  Yes Ladell Pier, MD  ?diazepam (VALIUM) 10 MG tablet Take 1 tablet (10 mg total) by mouth once as needed for up to 1 dose for anxiety (take 30 minutes prior to prostate biopsy). ?Patient not taking: Reported on 09/30/2021 04/01/21   Billey Co, MD  ?pregabalin (LYRICA) 75 MG capsule Take 1 capsule (75 mg total) by mouth 2 (two) times daily. 09/28/21   Edrick Kins, DPM  ?sildenafil (VIAGRA) 50 MG tablet Take 1-2 tablets (50-100 mg total) by mouth daily as needed for erectile dysfunction. 02/05/21   Billey Co, MD  ? ? ?Current Outpatient Medications  ?Medication Sig Dispense Refill  ? atorvastatin (LIPITOR) 80 MG tablet Take 1 tablet (80 mg total) by mouth daily. 90 tablet 3  ? Continuous Blood Gluc Sensor (FREESTYLE LIBRE SENSOR SYSTEM) MISC Change sensor Q 2 wks 2 each 12  ? fluticasone (FLONASE) 50  MCG/ACT nasal spray Place 1 spray into both nostrils daily. 16 g 2  ? linaclotide (LINZESS) 145 MCG CAPS capsule Take 1 capsule (145 mcg total) by mouth daily before breakfast. 30 capsule 1  ? metFORMIN (GLUCOPHAGE-XR) 500 MG 24 hr tablet Take 1 tablet by mouth once daily with breakfast 90 tablet 0  ? olmesartan (BENICAR) 20 MG tablet Take 1 tablet by mouth once daily 90 tablet 0  ? pantoprazole (PROTONIX) 20 MG tablet Take 1 tablet by mouth once daily 90 tablet 0  ? senna (SENOKOT) 8.6 MG tablet Take 1 tablet (8.6 mg total) by mouth daily as needed for constipation. 30 tablet 3  ? traMADol (ULTRAM) 50 MG tablet Take 1 tablet (50 mg total)  by mouth every 8 (eight) hours as needed (each prescription to last 1 mth). 80 tablet 2  ? diazepam (VALIUM) 10 MG tablet Take 1 tablet (10 mg total) by mouth once as needed for up to 1 dose for anxiety (take 30 minutes prior to prostate biopsy). (Patient not taking: Reported on 09/30/2021) 1 tablet 0  ? pregabalin (LYRICA) 75 MG capsule Take 1 capsule (75 mg total) by mouth 2 (two) times daily. 30 capsule 0  ? sildenafil (VIAGRA) 50 MG tablet Take 1-2 tablets (50-100 mg total) by mouth daily as needed for erectile dysfunction. 15 tablet 11  ? ?Current Facility-Administered Medications  ?Medication Dose Route Frequency Provider Last Rate Last Admin  ? 0.9 %  sodium chloride infusion  500 mL Intravenous Once Claris Guymon, Venia Minks, MD      ? ? ?Allergies as of 09/30/2021  ? (No Known Allergies)  ? ? ?Family History  ?Problem Relation Age of Onset  ? Diabetes Mother   ? Hypertension Mother   ? Other Father   ?     murdered  ? Diabetes Sister   ? Arthritis Sister   ? Heart disease Sister   ? Diabetes Sister   ? Diabetes Sister   ? Cancer Neg Hx   ? Stroke Neg Hx   ? Prostate cancer Neg Hx   ?     unknown  ? Bladder Cancer Neg Hx   ?     unkown  ? Kidney cancer Neg Hx   ?     unkown  ? ? ?Social History  ? ?Socioeconomic History  ? Marital status: Divorced  ?  Spouse name: Not on file  ? Number of children: 3  ? Years of education: Not on file  ? Highest education level: Not on file  ?Occupational History  ? Occupation: switcher/ spotter/forklift  ?Tobacco Use  ? Smoking status: Some Days  ?  Types: Cigarettes  ? Smokeless tobacco: Never  ?Vaping Use  ? Vaping Use: Never used  ?Substance and Sexual Activity  ? Alcohol use: Yes  ?  Comment: occasional beer  ? Drug use: No  ? Sexual activity: Yes  ?Other Topics Concern  ? Not on file  ?Social History Narrative  ? Lives with his sister.   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   Exercise - active on the job.  Has 3 children in [place].  ? ?Social Determinants of Health   ? ?Financial Resource Strain: Not on file  ?Food Insecurity: Not on file  ?Transportation Needs: Not on file  ?Physical Activity: Not on file  ?Stress: Not on file  ?Social Connections: Not on file  ?Intimate Partner Violence: Not on file  ? ? ?Review of Systems: ? ?All other  review of systems negative except as mentioned in the HPI. ? ?Physical Exam: ?Vital signs in last 24 hours: ?BP 137/81   Pulse 91   Temp (!) 96.2 ?F (35.7 ?C)   Ht '5\' 8"'$  (1.727 m)   Wt 289 lb (131.1 kg)   SpO2 98%   BMI 43.94 kg/m?  ?General:   Alert, NAD ?Lungs:  Clear .   ?Heart:  Regular rate and rhythm ?Abdomen:  Soft, nontender and nondistended. ?Neuro/Psych:  Alert and cooperative. Normal mood and affect. A and O x 3 ? ?Reviewed labs, radiology imaging, old records and pertinent past GI work up ? ?Patient is appropriate for planned procedure(s) and anesthesia in an ambulatory setting ? ? ?K. Denzil Magnuson , MD ?[phone]  ? ? ?  ?

## 2021-09-30 NOTE — Progress Notes (Signed)
Called to room to assist during endoscopic procedure.  Patient ID and intended procedure confirmed with present staff. Received instructions for my participation in the procedure from the performing physician.  

## 2021-09-30 NOTE — Progress Notes (Signed)
Pt non-responsive, VVS, Report to RN  °

## 2021-09-30 NOTE — Patient Instructions (Signed)
Thank you for coming in to see [place] today! ?Resume your diet and medications today, return to normal daily activities tomorrow. ?Biopsy results will be available in 1-2 weeks and recommendation will be made at that time for your next colonoscopy. ? ? ?YOU HAD AN ENDOSCOPIC PROCEDURE TODAY AT [facility]:   Refer to the procedure report that was given to you for any specific questions about what was found during the examination.  If the procedure report does not answer your questions, please call your gastroenterologist to clarify.  If you requested that your care partner not be given the details of your procedure findings, then the procedure report has been included in a sealed envelope for you to review at your convenience later. ? ?YOU SHOULD EXPECT: Some feelings of bloating in the abdomen. Passage of more gas than usual.  Walking can help get rid of the air that was put into your GI tract during the procedure and reduce the bloating. If you had a lower endoscopy (such as a colonoscopy or flexible sigmoidoscopy) you may notice spotting of blood in your stool or on the toilet paper. If you underwent a bowel prep for your procedure, you may not have a normal bowel movement for a few days. ? ?Please Note:  You might notice some irritation and congestion in your nose or some drainage.  This is from the oxygen used during your procedure.  There is no need for concern and it should clear up in a day or so. ? ?SYMPTOMS TO REPORT IMMEDIATELY: ? ?Following lower endoscopy (colonoscopy or flexible sigmoidoscopy): ? Excessive amounts of blood in the stool ? Significant tenderness or worsening of abdominal pains ? Swelling of the abdomen that is new, acute ? Fever of 100?F or higher ? ? ? ?For urgent or emergent issues, a gastroenterologist can be reached at any hour by calling [phone]. ?Do not use MyChart messaging for urgent concerns.  ? ? ?DIET:  We do recommend a small meal at first, but then you  may proceed to your regular diet.  Drink plenty of fluids but you should avoid alcoholic beverages for 24 hours. ? ?ACTIVITY:  You should plan to take it easy for the rest of today and you should NOT DRIVE or use heavy machinery until tomorrow (because of the sedation medicines used during the test).   ? ?FOLLOW UP: ?Our staff will call the number listed on your records 48-72 hours following your procedure to check on you and address any questions or concerns that you may have regarding the information given to you following your procedure. If we do not reach you, we will leave a message.  We will attempt to reach you two times.  During this call, we will ask if you have developed any symptoms of COVID 19. If you develop any symptoms (ie: fever, flu-like symptoms, shortness of breath, cough etc.) before then, please call [phone].  If you test positive for Covid 19 in the 2 weeks post procedure, please call and report this information to [place].   ? ?If any biopsies were taken you will be contacted by phone or by letter within the next 1-3 weeks.  Please call us at [phone] if you have not heard about the biopsies in 3 weeks.  ? ? ?SIGNATURES/CONFIDENTIALITY: ?You and/or your care partner have signed paperwork which will be entered into your electronic medical record.  These signatures attest to the fact that that the information above on your  After Visit Summary has been reviewed and is understood.  Full responsibility of the confidentiality of this discharge information lies with you and/or your care-partner.  ?

## 2021-10-02 ENCOUNTER — Encounter: Payer: Self-pay | Admitting: Gastroenterology

## 2021-10-02 ENCOUNTER — Telehealth: Payer: Self-pay | Admitting: *Deleted

## 2021-10-02 NOTE — Telephone Encounter (Signed)
?  Follow up Call- ? ? ?  09/30/2021  ?  7:36 AM  ?Call back number  ?Post procedure Call Back phone  # [phone]  ?Permission to leave phone message Yes  ?  ? ?Patient questions: ? ?Do you have a fever, pain , or abdominal swelling? No. ?Pain Score  0 * ? ?Have you tolerated food without any problems? Yes.   ? ?Have you been able to return to your normal activities? Yes.   ? ?Do you have any questions about your discharge instructions: ?Diet   No. ?Medications  No. ?Follow up visit  No. ? ?Do you have questions or concerns about your Care? No. ? ?Actions: ?* If pain score is 4 or above: ?No action needed, pain <4. ? ? ?

## 2021-10-02 NOTE — Progress Notes (Signed)
Referral was placed for patient to see Dr. Alen Blew and pt was scheduled for 09/29/2021.  ? ?Pt was unable to make appointment.   RN contacted patient, patient willing to make new appointment.  New patient coordinator notified to reschedule patient.  ?

## 2021-10-05 ENCOUNTER — Telehealth: Payer: Self-pay | Admitting: Oncology

## 2021-10-05 NOTE — Telephone Encounter (Signed)
Scheduled appointment per 04/03 referral. Patient is aware of appointment date and time. Patient is aware to arrive 15 mins prior to appointment time and to bring updated insurance cards. Patient is aware of location.  ? ?

## 2021-10-11 ENCOUNTER — Other Ambulatory Visit: Payer: Self-pay | Admitting: Internal Medicine

## 2021-10-11 DIAGNOSIS — M159 Polyosteoarthritis, unspecified: Secondary | ICD-10-CM

## 2021-10-16 ENCOUNTER — Telehealth: Payer: Self-pay | Admitting: *Deleted

## 2021-10-16 NOTE — Telephone Encounter (Signed)
CALLED PATIENT TO REMIND OF SIM APPT. FOR 10-19-21- ARRIVAL TIME- 9:45 AM @ [facility], PATIENT INFORMED TO ARRIVE WITH A FULL BLADDER AND AN EMPTY BOWEL, SPOKE WITH PATIENT AND HE VERIFIED UNDERSTANDING THIS APPT. AND THE INSTRUCTIONS ?

## 2021-10-16 NOTE — Telephone Encounter (Signed)
Linzess not covered by patient insuurance ?

## 2021-10-18 NOTE — Progress Notes (Signed)
?  Radiation Oncology         [phone] ?________________________________ ? ?Name: Matthew Colon. MRN: [id number]  ?Date: 10/19/2021  DOB: 05-29-1956 ? ?SIMULATION AND TREATMENT PLANNING NOTE ? ?  ICD-10-CM   ?1. Malignant neoplasm of prostate ([place])  C61   ?  ? ? ?DIAGNOSIS:  66 y.o. 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 ?NARRATIVE:  The patient was brought to the [facility].  Identity was confirmed.  All relevant records and images related to the planned course of therapy were reviewed.  The patient freely provided informed written consent to proceed with treatment after reviewing the details related to the planned course of therapy. The consent form was witnessed and verified by the simulation staff.  Then, the patient was set-up in a stable reproducible supine position for radiation therapy.  A vacuum lock pillow device was custom fabricated to position his legs in a reproducible immobilized position.  Then, I performed a urethrogram under sterile conditions to identify the prostatic bed.  CT images were obtained.  Surface markings were placed.  The CT images were loaded into the planning software.  Then the prostate bed target, pelvic lymph node target and avoidance structures including the rectum, bladder, bowel and hips were contoured.  Treatment planning then occurred.  The radiation prescription was entered and confirmed.  A total of one complex treatment devices were fabricated. I have requested : Intensity Modulated Radiotherapy (IMRT) is medically necessary for this case for the following reason:  Rectal sparing.. ? ?PLAN:  The patient will receive 45 Gy in 25 fractions of 1.8 Gy, followed by a boost to the prostate and PET-positive nodes to a total dose of 75 Gy with 15 additional fractions of 2 Gy. ? ? ?________________________________ ? ?Sheral Apley Tammi Klippel, M.D. ? ?

## 2021-10-19 ENCOUNTER — Other Ambulatory Visit: Payer: Self-pay

## 2021-10-19 ENCOUNTER — Ambulatory Visit
Admission: RE | Admit: 2021-10-19 | Discharge: 2021-10-19 | Disposition: A | Discharge status: Home / Self Care | Payer: 59 | Source: Ambulatory Visit | Attending: Radiation Oncology | Admitting: Radiation Oncology

## 2021-10-19 ENCOUNTER — Inpatient Hospital Stay: Payer: 59 | Attending: Oncology | Admitting: Oncology

## 2021-10-19 VITALS — BP 150/88 | HR 99 | Temp 97.3°F | Resp 17 | Wt 292.8 lb

## 2021-10-19 DIAGNOSIS — C61 Malignant neoplasm of prostate: Secondary | ICD-10-CM | POA: Diagnosis present

## 2021-10-19 NOTE — Progress Notes (Signed)
?Reason for the request:    Prostate cancer ? ?HPI: I was asked by Dr. Tammi Klippel to evaluate [name] for the diagnosis of prostate cancer.  Is a 66 year old man with history of obesity, hypertension and diabetes who was found to have an elevated PSA dating back to 2016.  He was evaluated by Dr. Dorette Grate and found to have an abnormal digital rectal examination.  MRI obtained in September 2022 showed diffuse abnormal signal throughout the peripheral zone as well as there is extending beyond the capsule.  He underwent biopsy in December 2022 and showed a Gleason score of 5+5 = 10 as the maximum score.  He had multiple cores involved.  He had a Gleason score 4+5 as well as 3+4 pattern.  Staging work-up including a bone scan did not show any evidence of metastatic disease but if he PSMA PET scan obtained August 24, 2021 showed left pelvic lymph node involvement as well as left lower retroperitoneal that is suspicious with increased activity in the left common iliac lymph node.  Based on these findings he was started on androgen deprivation therapy and was evaluated by Dr. Tammi Klippel to start definitive therapy with radiation.  Clinically, he reports few symptoms related to androgen deprivation including hot flashes and fatigue.  He remains active and continues to attempt activities of daily living.  Denies any bone pain or pathological fractures.  Performance status quality of life remains unchanged. ? ?He does not report any headaches, blurry vision, syncope or seizures. Does not report any fevers, chills or sweats.  Does not report any cough, wheezing or hemoptysis.  Does not report any chest pain, palpitation, orthopnea or leg edema.  Does not report any nausea, vomiting or abdominal pain.  Does not report any constipation or diarrhea.  Does not report any skeletal complaints.    Does not report frequency, urgency or hematuria.  Does not report any skin rashes or lesions. Does not report any heat or cold intolerance.   Does not report any lymphadenopathy or petechiae.  Does not report any anxiety or depression.  Remaining review of systems is negative.  ? ? ? ?Past Medical History:  ?Diagnosis Date  ? Allergy   ? Arthritis   ? DM (diabetes mellitus) ([place])   ? Foot pain, bilateral   ? referred to podiatry 06/2014  ? GERD (gastroesophageal reflux disease)   ? long history of  ? Hyperlipidemia   ? Hypertension   ? Polyarthralgia   ? shoulders, knees, ankles, wrists; neg rheum lab screen 04/2014  ? Prostate cancer ([name])   ? Type 2 diabetes mellitus with diabetic neuropathy ([facility])   ? Wears glasses   ?: ? ? ?Past Surgical History:  ?Procedure Laterality Date  ? COLONOSCOPY    ? referral pending 03/2015  ? NO PAST SURGERIES    ? as of 03/2015  ?: ? ? ?Current Outpatient Medications:  ?  atorvastatin (LIPITOR) 80 MG tablet, Take 1 tablet (80 mg total) by mouth daily., Disp: 90 tablet, Rfl: 3 ?  Continuous Blood Gluc Sensor (FREESTYLE LIBRE SENSOR SYSTEM) MISC, Change sensor Q 2 wks, Disp: 2 each, Rfl: 12 ?  diazepam (VALIUM) 10 MG tablet, Take 1 tablet (10 mg total) by mouth once as needed for up to 1 dose for anxiety (take 30 minutes prior to prostate biopsy). (Patient not taking: Reported on 09/30/2021), Disp: 1 tablet, Rfl: 0 ?  fluticasone (FLONASE) 50 MCG/ACT nasal spray, Place 1 spray into both nostrils daily., Disp: 16 g,  Rfl: 2 ?  linaclotide (LINZESS) 145 MCG CAPS capsule, Take 1 capsule (145 mcg total) by mouth daily before breakfast., Disp: 30 capsule, Rfl: 1 ?  metFORMIN (GLUCOPHAGE-XR) 500 MG 24 hr tablet, Take 1 tablet by mouth once daily with breakfast, Disp: 90 tablet, Rfl: 0 ?  olmesartan (BENICAR) 20 MG tablet, Take 1 tablet by mouth once daily, Disp: 90 tablet, Rfl: 0 ?  pantoprazole (PROTONIX) 20 MG tablet, Take 1 tablet by mouth once daily, Disp: 90 tablet, Rfl: 0 ?  pregabalin (LYRICA) 75 MG capsule, Take 1 capsule (75 mg total) by mouth 2 (two) times daily., Disp: 30 capsule, Rfl: 0 ?  senna (SENOKOT) 8.6 MG tablet,  Take 1 tablet (8.6 mg total) by mouth daily as needed for constipation., Disp: 30 tablet, Rfl: 3 ?  sildenafil (VIAGRA) 50 MG tablet, Take 1-2 tablets (50-100 mg total) by mouth daily as needed for erectile dysfunction., Disp: 15 tablet, Rfl: 11 ?  traMADol (ULTRAM) 50 MG tablet, Take 1 tablet (50 mg total) by mouth every 8 (eight) hours as needed., Disp: 80 tablet, Rfl: 0: ? ?No Known Allergies: ? ? ?Family History  ?Problem Relation Age of Onset  ? Diabetes Mother   ? Hypertension Mother   ? Other Father   ?     murdered  ? Diabetes Sister   ? Arthritis Sister   ? Heart disease Sister   ? Diabetes Sister   ? Diabetes Sister   ? Cancer Neg Hx   ? Stroke Neg Hx   ? Prostate cancer Neg Hx   ?     unknown  ? Bladder Cancer Neg Hx   ?     unkown  ? Kidney cancer Neg Hx   ?     unkown  ?: ? ? ?Social History  ? ?Socioeconomic History  ? Marital status: Divorced  ?  Spouse name: Not on file  ? Number of children: 3  ? Years of education: Not on file  ? Highest education level: Not on file  ?Occupational History  ? Occupation: switcher/ spotter/forklift  ?Tobacco Use  ? Smoking status: Some Days  ?  Types: Cigarettes  ? Smokeless tobacco: Never  ?Vaping Use  ? Vaping Use: Never used  ?Substance and Sexual Activity  ? Alcohol use: Yes  ?  Comment: occasional beer  ? Drug use: No  ? Sexual activity: Yes  ?Other Topics Concern  ? Not on file  ?Social History Narrative  ? Lives with his sister.   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   Exercise - active on the job.  Has 3 children in [place].  ? ?Social Determinants of Health  ? ?Financial Resource Strain: Not on file  ?Food Insecurity: Not on file  ?Transportation Needs: Not on file  ?Physical Activity: Not on file  ?Stress: Not on file  ?Social Connections: Not on file  ?Intimate Partner Violence: Not on file  ?: ? ?Pertinent items are noted in HPI. ? ?Exam: ?Blood pressure (!) 150/88, pulse 99, temperature (!) 97.3 ?F (36.3 ?C), temperature source Temporal, resp. rate 17,  weight 292 lb 12.8 oz (132.8 kg), SpO2 95 %. ?ECOG 1 ?General appearance: alert and cooperative appeared without distress. ?Head: atraumatic without any abnormalities. ?Eyes: conjunctivae/corneas clear. PERRL.  Sclera anicteric. ?Throat: lips, mucosa, and tongue normal; without oral thrush or ulcers. ?Resp: clear to auscultation bilaterally without rhonchi, wheezes or dullness to percussion. ?Cardio: regular rate and rhythm, S1, S2 normal, no murmur, click,  rub or gallop ?GI: soft, non-tender; bowel sounds normal; no masses,  no organomegaly ?Skin: Skin color, texture, turgor normal. No rashes or lesions ?Lymph nodes: Cervical, supraclavicular, and axillary nodes normal. ?Neurologic: Grossly normal without any motor, sensory or deep tendon reflexes. ?Musculoskeletal: No joint deformity or effusion. ? ? ?Assessment and Plan:  ? ?66 year old with: ? ?1.  Prostate cancer diagnosed in December 2022 with a PSA of 17.6 and a Gleason score 5+5 = 10.  Imaging studies shows oligometastatic disease involving left pelvic lymph nodes, left retroperitoneal and common iliac lymph nodes.  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 ? ?The natural course of this disease was discussed at this time and treatment choices were reviewed.  Based on conventional imaging he has high risk localized disease but definitely has advanced disease by PSMA PET criteria.  The natural course of this disease and treatment choices were reiterated. ? ?Treatment for his primary tremor is indicated which currently undergoing including androgen deprivation therapy with radiation.  Therapy escalation at this time would also be reasonable given his high risk localized disease under conventional imaging and oligometastatic disease with PSMA PET scanning criteria. ? ?Therapy escalation options include Taxotere chemotherapy, androgen synthesis inhibitors including Xtandi and Zytiga were discussed today in detail.  Complications that  include hypertension, hyperglycemia, fatigue and other chemotherapy complications were reviewed. ? ? ?After discussion today, he opted to defer any additional treatment to release he finishes radiation

## 2021-10-28 DIAGNOSIS — C61 Malignant neoplasm of prostate: Secondary | ICD-10-CM | POA: Diagnosis not present

## 2021-11-02 ENCOUNTER — Ambulatory Visit
Admission: RE | Admit: 2021-11-02 | Discharge: 2021-11-02 | Disposition: A | Discharge status: Home / Self Care | Payer: 59 | Source: Ambulatory Visit | Attending: Radiation Oncology | Admitting: Radiation Oncology

## 2021-11-02 ENCOUNTER — Other Ambulatory Visit: Payer: Self-pay

## 2021-11-02 DIAGNOSIS — Z510 Encounter for antineoplastic radiation therapy: Secondary | ICD-10-CM | POA: Diagnosis present

## 2021-11-02 DIAGNOSIS — C61 Malignant neoplasm of prostate: Secondary | ICD-10-CM | POA: Diagnosis present

## 2021-11-02 LAB — RAD ONC ARIA SESSION SUMMARY
Course Elapsed Days: 0
Plan Fractions Treated to Date: 1
Plan Prescribed Dose Per Fraction: 1.8 Gy
Plan Total Fractions Prescribed: 25
Plan Total Prescribed Dose: 45 Gy
Reference Point Dosage Given to Date: 1.8 Gy
Reference Point Session Dosage Given: 1.8 Gy
Session Number: 1

## 2021-11-03 ENCOUNTER — Other Ambulatory Visit: Payer: Self-pay

## 2021-11-03 ENCOUNTER — Ambulatory Visit
Admission: RE | Admit: 2021-11-03 | Discharge: 2021-11-03 | Disposition: A | Discharge status: Home / Self Care | Payer: 59 | Source: Ambulatory Visit | Attending: Radiation Oncology | Admitting: Radiation Oncology

## 2021-11-03 DIAGNOSIS — Z510 Encounter for antineoplastic radiation therapy: Secondary | ICD-10-CM | POA: Diagnosis not present

## 2021-11-03 LAB — RAD ONC ARIA SESSION SUMMARY
Course Elapsed Days: 1
Plan Fractions Treated to Date: 2
Plan Prescribed Dose Per Fraction: 1.8 Gy
Plan Total Fractions Prescribed: 25
Plan Total Prescribed Dose: 45 Gy
Reference Point Dosage Given to Date: 3.6 Gy
Reference Point Session Dosage Given: 1.8 Gy
Session Number: 2

## 2021-11-04 ENCOUNTER — Ambulatory Visit
Admission: RE | Admit: 2021-11-04 | Discharge: 2021-11-04 | Disposition: A | Discharge status: Home / Self Care | Payer: 59 | Source: Ambulatory Visit | Attending: Radiation Oncology | Admitting: Radiation Oncology

## 2021-11-04 ENCOUNTER — Other Ambulatory Visit: Payer: Self-pay

## 2021-11-04 DIAGNOSIS — Z510 Encounter for antineoplastic radiation therapy: Secondary | ICD-10-CM | POA: Diagnosis not present

## 2021-11-04 LAB — RAD ONC ARIA SESSION SUMMARY
Course Elapsed Days: 2
Plan Fractions Treated to Date: 3
Plan Prescribed Dose Per Fraction: 1.8 Gy
Plan Total Fractions Prescribed: 25
Plan Total Prescribed Dose: 45 Gy
Reference Point Dosage Given to Date: 5.4 Gy
Reference Point Session Dosage Given: 1.8 Gy
Session Number: 3

## 2021-11-05 ENCOUNTER — Other Ambulatory Visit: Payer: Self-pay

## 2021-11-05 ENCOUNTER — Ambulatory Visit
Admission: RE | Admit: 2021-11-05 | Discharge: 2021-11-05 | Disposition: A | Discharge status: Home / Self Care | Payer: 59 | Source: Ambulatory Visit | Attending: Radiation Oncology | Admitting: Radiation Oncology

## 2021-11-05 DIAGNOSIS — Z510 Encounter for antineoplastic radiation therapy: Secondary | ICD-10-CM | POA: Diagnosis not present

## 2021-11-05 LAB — RAD ONC ARIA SESSION SUMMARY
Course Elapsed Days: 3
Plan Fractions Treated to Date: 4
Plan Prescribed Dose Per Fraction: 1.8 Gy
Plan Total Fractions Prescribed: 25
Plan Total Prescribed Dose: 45 Gy
Reference Point Dosage Given to Date: 7.2 Gy
Reference Point Session Dosage Given: 1.8 Gy
Session Number: 4

## 2021-11-06 ENCOUNTER — Other Ambulatory Visit: Payer: Self-pay

## 2021-11-06 ENCOUNTER — Ambulatory Visit
Admission: RE | Admit: 2021-11-06 | Discharge: 2021-11-06 | Disposition: A | Discharge status: Home / Self Care | Payer: 59 | Source: Ambulatory Visit | Attending: Radiation Oncology | Admitting: Radiation Oncology

## 2021-11-06 ENCOUNTER — Other Ambulatory Visit: Payer: Self-pay | Admitting: Internal Medicine

## 2021-11-06 DIAGNOSIS — M159 Polyosteoarthritis, unspecified: Secondary | ICD-10-CM

## 2021-11-06 DIAGNOSIS — Z510 Encounter for antineoplastic radiation therapy: Secondary | ICD-10-CM | POA: Diagnosis not present

## 2021-11-06 LAB — RAD ONC ARIA SESSION SUMMARY
Course Elapsed Days: 4
Plan Fractions Treated to Date: 5
Plan Prescribed Dose Per Fraction: 1.8 Gy
Plan Total Fractions Prescribed: 25
Plan Total Prescribed Dose: 45 Gy
Reference Point Dosage Given to Date: 9 Gy
Reference Point Session Dosage Given: 1.8 Gy
Session Number: 5

## 2021-11-06 NOTE — Progress Notes (Signed)
Pt here for patient teaching. Pt given Radiation and You booklet and skin care instructions.  Reviewed areas of pertinence such as diarrhea, fatigue, hair loss, nausea and vomiting, sexual and fertility changes, skin changes, urinary and bladder changes, earaches, and taste changes. Pt able to give teach back of to pat skin, use unscented/gentle soap, use baby wipes, have Imodium on hand, drink plenty of water, and sitz bath, avoid applying anything to skin within 4 hours of treatment and to use an electric razor if they must shave. Pt verbalizes understanding of information given and will contact nursing with any questions or concerns.   ? ? [email or web address] ? ? ? ? ? ?

## 2021-11-09 ENCOUNTER — Ambulatory Visit
Admission: RE | Admit: 2021-11-09 | Discharge: 2021-11-09 | Disposition: A | Discharge status: Home / Self Care | Payer: 59 | Source: Ambulatory Visit | Attending: Radiation Oncology | Admitting: Radiation Oncology

## 2021-11-09 ENCOUNTER — Other Ambulatory Visit: Payer: Self-pay

## 2021-11-09 DIAGNOSIS — Z510 Encounter for antineoplastic radiation therapy: Secondary | ICD-10-CM | POA: Diagnosis not present

## 2021-11-09 LAB — RAD ONC ARIA SESSION SUMMARY
Course Elapsed Days: 7
Plan Fractions Treated to Date: 6
Plan Prescribed Dose Per Fraction: 1.8 Gy
Plan Total Fractions Prescribed: 25
Plan Total Prescribed Dose: 45 Gy
Reference Point Dosage Given to Date: 10.8 Gy
Reference Point Session Dosage Given: 1.8 Gy
Session Number: 6

## 2021-11-09 NOTE — Telephone Encounter (Signed)
Requested medication (s) are due for refill today: Yes ? ?Requested medication (s) are on the active medication list: Yes ? ?Last refill:  10/13/21 ? ?Future visit scheduled: No ? ?Notes to clinic:  See request. ? ? ? ?Requested Prescriptions  ?Pending Prescriptions Disp Refills  ? traMADol (ULTRAM) 50 MG tablet [Pharmacy Med Name: TRAMADOL '50MG'$  TABLETS] 80 tablet   ?  Sig: TAKE 1 TABLET(50 MG) BY MOUTH EVERY 8 HOURS AS NEEDED  ?  ? Not Delegated - Analgesics:  Opioid Agonists Failed - 11/06/2021  6:23 PM  ?  ?  Failed - This refill cannot be delegated  ?  ?  Failed - Valid encounter within last 3 months  ?  Recent Outpatient Visits   ? ?      ? 3 months ago Type 2 diabetes mellitus with peripheral neuropathy ([place])  ? [place] Ladell Pier, MD  ? 6 months ago Need for influenza vaccination  ? [place], RPH-CPP  ? 7 months ago Type 2 diabetes mellitus with peripheral neuropathy ([place])  ? [place] Ladell Pier, MD  ? 9 months ago Hypertension associated with diabetes [facility])  ? [place], RPH-CPP  ? 10 months ago Hypertension associated with diabetes ([place])  ? [place], RPH-CPP  ? ?  ?  ?Future Appointments   ? ?        ? In 1 week Ladell Pier, MD [place]  ? ?  ? ? ?  ?  ?  Passed - Urine Drug Screen completed in last 360 days  ?  ?  ? ?

## 2021-11-10 ENCOUNTER — Other Ambulatory Visit: Payer: Self-pay

## 2021-11-10 ENCOUNTER — Ambulatory Visit
Admission: RE | Admit: 2021-11-10 | Discharge: 2021-11-10 | Disposition: A | Discharge status: Home / Self Care | Payer: 59 | Source: Ambulatory Visit | Attending: Radiation Oncology | Admitting: Radiation Oncology

## 2021-11-10 ENCOUNTER — Other Ambulatory Visit: Payer: Self-pay | Admitting: Internal Medicine

## 2021-11-10 DIAGNOSIS — M159 Polyosteoarthritis, unspecified: Secondary | ICD-10-CM

## 2021-11-10 DIAGNOSIS — Z510 Encounter for antineoplastic radiation therapy: Secondary | ICD-10-CM | POA: Diagnosis not present

## 2021-11-10 LAB — RAD ONC ARIA SESSION SUMMARY
Course Elapsed Days: 8
Plan Fractions Treated to Date: 7
Plan Prescribed Dose Per Fraction: 1.8 Gy
Plan Total Fractions Prescribed: 25
Plan Total Prescribed Dose: 45 Gy
Reference Point Dosage Given to Date: 12.6 Gy
Reference Point Session Dosage Given: 1.8 Gy
Session Number: 7

## 2021-11-10 NOTE — Telephone Encounter (Signed)
Copied from [place]. Topic: General - Other ?>> Nov 10, 2021 11:10 AM Tessa Lerner A wrote: ?Reason for CRM: Medication Refill - Medication: traMADol (ULTRAM) 50 MG tablet [[id number]] - patient has no tablets remaining  ? ?Has the patient contacted their pharmacy? Yes.  The patient has been directed to contact their PCP ?(Agent: If no, request that the patient contact the pharmacy for the refill. If patient does not wish to contact the pharmacy document the reason why and proceed with request.) ?(Agent: If yes, when and what did the pharmacy advise?) ? ?Preferred Pharmacy (with phone number or street name): [place], [address] ?[place] [address] ?Phone: [phone] Fax: [phone] ?Hours: Not open 24 hours ? ?Has the patient been seen for an appointment in the last year OR does the patient have an upcoming appointment? Yes.   ? ?Agent: Please be advised that RX refills may take up to 3 business days. We ask that you follow-up with your pharmacy. ?

## 2021-11-11 ENCOUNTER — Other Ambulatory Visit: Payer: Self-pay

## 2021-11-11 ENCOUNTER — Ambulatory Visit
Admission: RE | Admit: 2021-11-11 | Discharge: 2021-11-11 | Disposition: A | Discharge status: Home / Self Care | Payer: 59 | Source: Ambulatory Visit | Attending: Radiation Oncology | Admitting: Radiation Oncology

## 2021-11-11 DIAGNOSIS — Z510 Encounter for antineoplastic radiation therapy: Secondary | ICD-10-CM | POA: Diagnosis not present

## 2021-11-11 LAB — RAD ONC ARIA SESSION SUMMARY
Course Elapsed Days: 9
Plan Fractions Treated to Date: 8
Plan Prescribed Dose Per Fraction: 1.8 Gy
Plan Total Fractions Prescribed: 25
Plan Total Prescribed Dose: 45 Gy
Reference Point Dosage Given to Date: 14.4 Gy
Reference Point Session Dosage Given: 1.8 Gy
Session Number: 8

## 2021-11-11 NOTE — Telephone Encounter (Signed)
Requested medications are due for refill today.  yes ? ?Requested medications are on the active medications list.  yes ? ?Last refill. 10/13/2021 #80 0 refills ? ?Future visit scheduled.   Yes - 1 week ? ?Notes to clinic.  Medication refill is not delegated. Please review for refill. ? ? ? ?Requested Prescriptions  ?Pending Prescriptions Disp Refills  ? traMADol (ULTRAM) 50 MG tablet 80 tablet 0  ?  Sig: Take 1 tablet (50 mg total) by mouth every 8 (eight) hours as needed.  ?  ? Not Delegated - Analgesics:  Opioid Agonists Failed - 11/10/2021  1:47 PM  ?  ?  Failed - This refill cannot be delegated  ?  ?  Failed - Urine Drug Screen completed in last 360 days  ?  ?  Failed - Valid encounter within last 3 months  ?  Recent Outpatient Visits   ? ?      ? 3 months ago Type 2 diabetes mellitus with peripheral neuropathy ([place])  ? [place] Ladell Pier, MD  ? 6 months ago Need for influenza vaccination  ? [place], RPH-CPP  ? 8 months ago Type 2 diabetes mellitus with peripheral neuropathy ([place])  ? [place] Ladell Pier, MD  ? 10 months ago Hypertension associated with diabetes [facility])  ? [place], RPH-CPP  ? 11 months ago Hypertension associated with diabetes ([place])  ? [place], RPH-CPP  ? ?  ?  ?Future Appointments   ? ?        ? In 1 week Ladell Pier, MD [place]  ? ?  ? ? ?  ?  ?  ?  ?

## 2021-11-12 ENCOUNTER — Other Ambulatory Visit: Payer: Self-pay

## 2021-11-12 ENCOUNTER — Ambulatory Visit
Admission: RE | Admit: 2021-11-12 | Discharge: 2021-11-12 | Disposition: A | Discharge status: Home / Self Care | Payer: 59 | Source: Ambulatory Visit | Attending: Radiation Oncology | Admitting: Radiation Oncology

## 2021-11-12 DIAGNOSIS — Z510 Encounter for antineoplastic radiation therapy: Secondary | ICD-10-CM | POA: Diagnosis not present

## 2021-11-12 LAB — RAD ONC ARIA SESSION SUMMARY
Course Elapsed Days: 10
Plan Fractions Treated to Date: 9
Plan Prescribed Dose Per Fraction: 1.8 Gy
Plan Total Fractions Prescribed: 25
Plan Total Prescribed Dose: 45 Gy
Reference Point Dosage Given to Date: 16.2 Gy
Reference Point Session Dosage Given: 1.8 Gy
Session Number: 9

## 2021-11-12 NOTE — Telephone Encounter (Signed)
Pt following up on request for Tramadol. ?Pt state he cannot go through the weekend in pain. ?Pt states it is due today and he put request in 2 days ago. ? ?[place], [place] ?

## 2021-11-13 ENCOUNTER — Other Ambulatory Visit: Payer: Self-pay | Admitting: Internal Medicine

## 2021-11-13 ENCOUNTER — Other Ambulatory Visit: Payer: Self-pay

## 2021-11-13 ENCOUNTER — Emergency Department (HOSPITAL_COMMUNITY): Admission: EM | Admit: 2021-11-13 | Discharge: 2021-11-14 | Discharge status: Against Medical Advice | Payer: 59

## 2021-11-13 ENCOUNTER — Ambulatory Visit
Admission: RE | Admit: 2021-11-13 | Discharge: 2021-11-13 | Disposition: A | Discharge status: Home / Self Care | Payer: 59 | Source: Ambulatory Visit | Attending: Radiation Oncology | Admitting: Radiation Oncology

## 2021-11-13 DIAGNOSIS — E1142 Type 2 diabetes mellitus with diabetic polyneuropathy: Secondary | ICD-10-CM

## 2021-11-13 DIAGNOSIS — Z510 Encounter for antineoplastic radiation therapy: Secondary | ICD-10-CM | POA: Diagnosis not present

## 2021-11-13 LAB — RAD ONC ARIA SESSION SUMMARY
Course Elapsed Days: 11
Plan Fractions Treated to Date: 10
Plan Prescribed Dose Per Fraction: 1.8 Gy
Plan Total Fractions Prescribed: 25
Plan Total Prescribed Dose: 45 Gy
Reference Point Dosage Given to Date: 18 Gy
Reference Point Session Dosage Given: 1.8 Gy
Session Number: 10

## 2021-11-13 NOTE — ED Notes (Signed)
Pt stating they are leaving  ?

## 2021-11-16 ENCOUNTER — Ambulatory Visit
Admission: RE | Admit: 2021-11-16 | Discharge: 2021-11-16 | Disposition: A | Discharge status: Home / Self Care | Payer: 59 | Source: Ambulatory Visit | Attending: Radiation Oncology | Admitting: Radiation Oncology

## 2021-11-16 ENCOUNTER — Other Ambulatory Visit: Payer: Self-pay

## 2021-11-16 DIAGNOSIS — Z510 Encounter for antineoplastic radiation therapy: Secondary | ICD-10-CM | POA: Diagnosis not present

## 2021-11-16 LAB — RAD ONC ARIA SESSION SUMMARY
Course Elapsed Days: 14
Plan Fractions Treated to Date: 11
Plan Prescribed Dose Per Fraction: 1.8 Gy
Plan Total Fractions Prescribed: 25
Plan Total Prescribed Dose: 45 Gy
Reference Point Dosage Given to Date: 19.8 Gy
Reference Point Session Dosage Given: 1.8 Gy
Session Number: 11

## 2021-11-16 NOTE — Telephone Encounter (Signed)
Requested medication (s) are due for refill today -provider review ? ?Requested medication (s) are on the active medication list -yes ? ?Future visit scheduled -yes ? ?Last refill: 10/13/21 #80 ? ?Notes to clinic: non delegated Rx ? ?Requested Prescriptions  ?Pending Prescriptions Disp Refills  ? traMADol (ULTRAM) 50 MG tablet 80 tablet 0  ?  Sig: Take 1 tablet (50 mg total) by mouth every 8 (eight) hours as needed.  ?  ? Not Delegated - Analgesics:  Opioid Agonists Failed - 11/16/2021  8:03 AM  ?  ?  Failed - This refill cannot be delegated  ?  ?  Failed - Urine Drug Screen completed in last 360 days  ?  ?  Failed - Valid encounter within last 3 months  ?  Recent Outpatient Visits   ? ?      ? 3 months ago Type 2 diabetes mellitus with peripheral neuropathy ([place])  ? [place] Ladell Pier, MD  ? 6 months ago Need for influenza vaccination  ? [place], RPH-CPP  ? 8 months ago Type 2 diabetes mellitus with peripheral neuropathy ([place])  ? [place] Ladell Pier, MD  ? 10 months ago Hypertension associated with diabetes [facility])  ? [place], RPH-CPP  ? 11 months ago Hypertension associated with diabetes ([place])  ? [place], RPH-CPP  ? ?  ?  ?Future Appointments   ? ?        ? In 3 days Ladell Pier, MD [place]  ? ?  ? ? ?  ?  ?  ? ? ? ?Requested Prescriptions  ?Pending Prescriptions Disp Refills  ? traMADol (ULTRAM) 50 MG tablet 80 tablet 0  ?  Sig: Take 1 tablet (50 mg total) by mouth every 8 (eight) hours as needed.  ?  ? Not Delegated - Analgesics:  Opioid Agonists Failed - 11/16/2021  8:03 AM  ?  ?  Failed - This refill cannot be delegated  ?  ?  Failed - Urine Drug Screen completed in last 360 days  ?  ?  Failed - Valid encounter within last 3  months  ?  Recent Outpatient Visits   ? ?      ? 3 months ago Type 2 diabetes mellitus with peripheral neuropathy ([place])  ? [place] Ladell Pier, MD  ? 6 months ago Need for influenza vaccination  ? [place], RPH-CPP  ? 8 months ago Type 2 diabetes mellitus with peripheral neuropathy ([place])  ? [place] Ladell Pier, MD  ? 10 months ago Hypertension associated with diabetes [facility])  ? [place], RPH-CPP  ? 11 months ago Hypertension associated with diabetes ([place])  ? [place], RPH-CPP  ? ?  ?  ?Future Appointments   ? ?        ? In 3 days Ladell Pier, MD [place]  ? ?  ? ? ?  ?  ?  ? ? ? ?

## 2021-11-16 NOTE — Telephone Encounter (Signed)
Copied from [place]. Topic: General - Other ?>> Nov 10, 2021 11:10 AM Tessa Lerner A wrote: ?Reason for CRM: Medication Refill - Medication: traMADol (ULTRAM) 50 MG tablet [[id number]] - patient has no tablets remaining  ? ?Has the patient contacted their pharmacy? Yes.  The patient has been directed to contact their PCP ?(Agent: If no, request that the patient contact the pharmacy for the refill. If patient does not wish to contact the pharmacy document the reason why and proceed with request.) ?(Agent: If yes, when and what did the pharmacy advise?) ? ?Preferred Pharmacy (with phone number or street name): [place], [address] ?[place] [address] ?Phone: [phone] Fax: [phone] ?Hours: Not open 24 hours ? ?Has the patient been seen for an appointment in the last year OR does the patient have an upcoming appointment? Yes.   ? ?Agent: Please be advised that RX refills may take up to 3 business days. We ask that you follow-up with your pharmacy. ?

## 2021-11-17 ENCOUNTER — Ambulatory Visit
Admission: RE | Admit: 2021-11-17 | Discharge: 2021-11-17 | Disposition: A | Discharge status: Home / Self Care | Payer: 59 | Source: Ambulatory Visit | Attending: Radiation Oncology | Admitting: Radiation Oncology

## 2021-11-17 ENCOUNTER — Other Ambulatory Visit: Payer: Self-pay | Admitting: Internal Medicine

## 2021-11-17 ENCOUNTER — Ambulatory Visit: Payer: 59 | Admitting: Physician Assistant

## 2021-11-17 ENCOUNTER — Other Ambulatory Visit: Payer: Self-pay

## 2021-11-17 DIAGNOSIS — M159 Polyosteoarthritis, unspecified: Secondary | ICD-10-CM

## 2021-11-17 DIAGNOSIS — Z510 Encounter for antineoplastic radiation therapy: Secondary | ICD-10-CM | POA: Diagnosis not present

## 2021-11-17 LAB — RAD ONC ARIA SESSION SUMMARY
Course Elapsed Days: 15
Plan Fractions Treated to Date: 12
Plan Prescribed Dose Per Fraction: 1.8 Gy
Plan Total Fractions Prescribed: 25
Plan Total Prescribed Dose: 45 Gy
Reference Point Dosage Given to Date: 21.6 Gy
Reference Point Session Dosage Given: 1.8 Gy
Session Number: 12

## 2021-11-17 MED ORDER — TRAMADOL HCL 50 MG PO TABS: 50.0000 mg | ORAL_TABLET | Freq: Three times a day (TID) | ORAL | 0 refills | Status: DC | PRN

## 2021-11-18 ENCOUNTER — Other Ambulatory Visit: Payer: Self-pay

## 2021-11-18 ENCOUNTER — Ambulatory Visit
Admission: RE | Admit: 2021-11-18 | Discharge: 2021-11-18 | Disposition: A | Discharge status: Home / Self Care | Payer: 59 | Source: Ambulatory Visit | Attending: Radiation Oncology | Admitting: Radiation Oncology

## 2021-11-18 DIAGNOSIS — Z510 Encounter for antineoplastic radiation therapy: Secondary | ICD-10-CM | POA: Diagnosis not present

## 2021-11-18 LAB — RAD ONC ARIA SESSION SUMMARY
Course Elapsed Days: 16
Plan Fractions Treated to Date: 13
Plan Prescribed Dose Per Fraction: 1.8 Gy
Plan Total Fractions Prescribed: 25
Plan Total Prescribed Dose: 45 Gy
Reference Point Dosage Given to Date: 23.4 Gy
Reference Point Session Dosage Given: 1.8 Gy
Session Number: 13

## 2021-11-19 ENCOUNTER — Ambulatory Visit: Payer: 59 | Admitting: Internal Medicine

## 2021-11-19 ENCOUNTER — Other Ambulatory Visit: Payer: Self-pay

## 2021-11-19 ENCOUNTER — Ambulatory Visit
Admission: RE | Admit: 2021-11-19 | Discharge: 2021-11-19 | Disposition: A | Discharge status: Home / Self Care | Payer: 59 | Source: Ambulatory Visit | Attending: Radiation Oncology | Admitting: Radiation Oncology

## 2021-11-19 DIAGNOSIS — Z510 Encounter for antineoplastic radiation therapy: Secondary | ICD-10-CM | POA: Diagnosis not present

## 2021-11-19 LAB — RAD ONC ARIA SESSION SUMMARY
Course Elapsed Days: 17
Plan Fractions Treated to Date: 14
Plan Prescribed Dose Per Fraction: 1.8 Gy
Plan Total Fractions Prescribed: 25
Plan Total Prescribed Dose: 45 Gy
Reference Point Dosage Given to Date: 25.2 Gy
Reference Point Session Dosage Given: 1.8 Gy
Session Number: 14

## 2021-11-20 ENCOUNTER — Other Ambulatory Visit: Payer: Self-pay

## 2021-11-20 ENCOUNTER — Ambulatory Visit
Admission: RE | Admit: 2021-11-20 | Discharge: 2021-11-20 | Disposition: A | Discharge status: Home / Self Care | Payer: 59 | Source: Ambulatory Visit | Attending: Radiation Oncology | Admitting: Radiation Oncology

## 2021-11-20 DIAGNOSIS — Z510 Encounter for antineoplastic radiation therapy: Secondary | ICD-10-CM | POA: Diagnosis not present

## 2021-11-20 LAB — RAD ONC ARIA SESSION SUMMARY
Course Elapsed Days: 18
Plan Fractions Treated to Date: 15
Plan Prescribed Dose Per Fraction: 1.8 Gy
Plan Total Fractions Prescribed: 25
Plan Total Prescribed Dose: 45 Gy
Reference Point Dosage Given to Date: 27 Gy
Reference Point Session Dosage Given: 1.8 Gy
Session Number: 15

## 2021-11-23 ENCOUNTER — Ambulatory Visit
Admission: RE | Admit: 2021-11-23 | Discharge: 2021-11-23 | Disposition: A | Discharge status: Home / Self Care | Payer: 59 | Source: Ambulatory Visit | Attending: Radiation Oncology | Admitting: Radiation Oncology

## 2021-11-23 ENCOUNTER — Other Ambulatory Visit: Payer: Self-pay

## 2021-11-23 DIAGNOSIS — Z510 Encounter for antineoplastic radiation therapy: Secondary | ICD-10-CM | POA: Diagnosis not present

## 2021-11-23 LAB — RAD ONC ARIA SESSION SUMMARY
Course Elapsed Days: 21
Plan Fractions Treated to Date: 16
Plan Prescribed Dose Per Fraction: 1.8 Gy
Plan Total Fractions Prescribed: 25
Plan Total Prescribed Dose: 45 Gy
Reference Point Dosage Given to Date: 28.8 Gy
Reference Point Session Dosage Given: 1.8 Gy
Session Number: 16

## 2021-11-24 ENCOUNTER — Other Ambulatory Visit: Payer: Self-pay

## 2021-11-24 ENCOUNTER — Ambulatory Visit
Admission: RE | Admit: 2021-11-24 | Discharge: 2021-11-24 | Disposition: A | Discharge status: Home / Self Care | Payer: 59 | Source: Ambulatory Visit | Attending: Radiation Oncology | Admitting: Radiation Oncology

## 2021-11-24 DIAGNOSIS — Z510 Encounter for antineoplastic radiation therapy: Secondary | ICD-10-CM | POA: Diagnosis not present

## 2021-11-24 LAB — RAD ONC ARIA SESSION SUMMARY
Course Elapsed Days: 22
Plan Fractions Treated to Date: 17
Plan Prescribed Dose Per Fraction: 1.8 Gy
Plan Total Fractions Prescribed: 25
Plan Total Prescribed Dose: 45 Gy
Reference Point Dosage Given to Date: 30.6 Gy
Reference Point Session Dosage Given: 1.8 Gy
Session Number: 17

## 2021-11-25 ENCOUNTER — Ambulatory Visit
Admission: RE | Admit: 2021-11-25 | Discharge: 2021-11-25 | Disposition: A | Discharge status: Home / Self Care | Payer: 59 | Source: Ambulatory Visit | Attending: Radiation Oncology | Admitting: Radiation Oncology

## 2021-11-25 ENCOUNTER — Other Ambulatory Visit: Payer: Self-pay

## 2021-11-25 DIAGNOSIS — Z510 Encounter for antineoplastic radiation therapy: Secondary | ICD-10-CM | POA: Diagnosis not present

## 2021-11-25 LAB — RAD ONC ARIA SESSION SUMMARY
Course Elapsed Days: 23
Plan Fractions Treated to Date: 18
Plan Prescribed Dose Per Fraction: 1.8 Gy
Plan Total Fractions Prescribed: 25
Plan Total Prescribed Dose: 45 Gy
Reference Point Dosage Given to Date: 32.4 Gy
Reference Point Session Dosage Given: 1.8 Gy
Session Number: 18

## 2021-11-26 ENCOUNTER — Ambulatory Visit
Admission: RE | Admit: 2021-11-26 | Discharge: 2021-11-26 | Disposition: A | Discharge status: Home / Self Care | Payer: 59 | Source: Ambulatory Visit | Attending: Radiation Oncology | Admitting: Radiation Oncology

## 2021-11-26 ENCOUNTER — Telehealth: Payer: Self-pay | Admitting: Oncology

## 2021-11-26 ENCOUNTER — Other Ambulatory Visit: Payer: Self-pay

## 2021-11-26 DIAGNOSIS — Z510 Encounter for antineoplastic radiation therapy: Secondary | ICD-10-CM | POA: Diagnosis not present

## 2021-11-26 LAB — RAD ONC ARIA SESSION SUMMARY
Course Elapsed Days: 24
Plan Fractions Treated to Date: 19
Plan Prescribed Dose Per Fraction: 1.8 Gy
Plan Total Fractions Prescribed: 25
Plan Total Prescribed Dose: 45 Gy
Reference Point Dosage Given to Date: 34.2 Gy
Reference Point Session Dosage Given: 1.8 Gy
Session Number: 19

## 2021-11-26 NOTE — Telephone Encounter (Signed)
Called patient regarding upcoming appointment, patient is notified. °

## 2021-11-27 ENCOUNTER — Ambulatory Visit
Admission: RE | Admit: 2021-11-27 | Discharge: 2021-11-27 | Disposition: A | Discharge status: Home / Self Care | Payer: 59 | Source: Ambulatory Visit | Attending: Radiation Oncology | Admitting: Radiation Oncology

## 2021-11-27 ENCOUNTER — Other Ambulatory Visit: Payer: Self-pay

## 2021-11-27 DIAGNOSIS — Z510 Encounter for antineoplastic radiation therapy: Secondary | ICD-10-CM | POA: Diagnosis not present

## 2021-11-27 LAB — RAD ONC ARIA SESSION SUMMARY
Course Elapsed Days: 25
Plan Fractions Treated to Date: 20
Plan Prescribed Dose Per Fraction: 1.8 Gy
Plan Total Fractions Prescribed: 25
Plan Total Prescribed Dose: 45 Gy
Reference Point Dosage Given to Date: 36 Gy
Reference Point Session Dosage Given: 1.8 Gy
Session Number: 20

## 2021-12-01 ENCOUNTER — Other Ambulatory Visit: Payer: Self-pay

## 2021-12-01 ENCOUNTER — Telehealth: Payer: Self-pay | Admitting: Internal Medicine

## 2021-12-01 ENCOUNTER — Ambulatory Visit: Payer: Self-pay | Admitting: *Deleted

## 2021-12-01 ENCOUNTER — Ambulatory Visit
Admission: RE | Admit: 2021-12-01 | Discharge: 2021-12-01 | Disposition: A | Discharge status: Home / Self Care | Payer: 59 | Source: Ambulatory Visit | Attending: Radiation Oncology | Admitting: Radiation Oncology

## 2021-12-01 DIAGNOSIS — Z510 Encounter for antineoplastic radiation therapy: Secondary | ICD-10-CM | POA: Diagnosis not present

## 2021-12-01 DIAGNOSIS — E1142 Type 2 diabetes mellitus with diabetic polyneuropathy: Secondary | ICD-10-CM

## 2021-12-01 LAB — RAD ONC ARIA SESSION SUMMARY
Course Elapsed Days: 29
Plan Fractions Treated to Date: 21
Plan Prescribed Dose Per Fraction: 1.8 Gy
Plan Total Fractions Prescribed: 25
Plan Total Prescribed Dose: 45 Gy
Reference Point Dosage Given to Date: 37.8 Gy
Reference Point Session Dosage Given: 1.8 Gy
Session Number: 21

## 2021-12-01 MED ORDER — METFORMIN HCL ER 500 MG PO TB24: 500.0000 mg | ORAL_TABLET | Freq: Every day | ORAL | 0 refills | Status: DC

## 2021-12-01 NOTE — Telephone Encounter (Signed)
FYI

## 2021-12-01 NOTE — Telephone Encounter (Signed)
Pharmacy states patient picked up metFORMIN (GLUCOPHAGE-XR) 500 MG 24 hr tablet on 11/15/2021, patient called in for a refill. Patient does have an upcoming appointment scheduled for 12/16/2021. Pharmacy states patient should have 2 weeks worth of medication, please inquire if patient is taking medication as prescribed.        Chief Complaint: Early refill Symptoms: NA Frequency: NA Pertinent Negatives: Patient denies NA Disposition: '[]'$ ED /'[]'$ Urgent Care (no appt availability in office) / '[]'$ Appointment(In office/virtual)/ '[]'$  [place] [facility]/ '[]'$ Home Care/ '[]'$ Refused Recommended Disposition /'[]'$ [place] [facility]/ '[x]'$  Follow-up with PCP Additional Notes:  Pharmacy called to report pt requested early refill. Concerned he was not taking as prescribed. Called and spoke to pt, states he knows he should take 1tab at breakfast as ordered "But if my blood sugar is high later in day I take another one." States has been running 190-200 at times. Reports"I like it at 110-115". Questioned at what value he took additional tab, evasive. States he thinks he should be on 2 a day. Reports he is not completely out, "Have about 10 left." Assured NT would route to practice for PCPs review.  Reason for Disposition  [1] Pharmacy calling with prescription question AND [2] triager unable to answer question  Answer Assessment - Initial Assessment Questions 1. NAME of MEDICATION: "What medicine are you calling about?"     Metformin 2. QUESTION: "What is your question?" (e.g., double dose of medicine, side effect)     Pharmacy concerned pt requesting early  Protocols used: Medication Question Call-A-AH

## 2021-12-01 NOTE — Telephone Encounter (Signed)
Noted patient has upcoming appointment to discuss.

## 2021-12-02 ENCOUNTER — Other Ambulatory Visit: Payer: Self-pay

## 2021-12-02 ENCOUNTER — Ambulatory Visit
Admission: RE | Admit: 2021-12-02 | Discharge: 2021-12-02 | Disposition: A | Discharge status: Home / Self Care | Payer: 59 | Source: Ambulatory Visit | Attending: Radiation Oncology | Admitting: Radiation Oncology

## 2021-12-02 DIAGNOSIS — Z510 Encounter for antineoplastic radiation therapy: Secondary | ICD-10-CM | POA: Diagnosis not present

## 2021-12-02 LAB — RAD ONC ARIA SESSION SUMMARY
Course Elapsed Days: 30
Plan Fractions Treated to Date: 22
Plan Prescribed Dose Per Fraction: 1.8 Gy
Plan Total Fractions Prescribed: 25
Plan Total Prescribed Dose: 45 Gy
Reference Point Dosage Given to Date: 39.6 Gy
Reference Point Session Dosage Given: 1.8 Gy
Session Number: 22

## 2021-12-02 NOTE — Telephone Encounter (Signed)
Mailbox is currently full.

## 2021-12-03 ENCOUNTER — Ambulatory Visit: Payer: 59 | Admitting: Physician Assistant

## 2021-12-03 ENCOUNTER — Ambulatory Visit
Admission: RE | Admit: 2021-12-03 | Discharge: 2021-12-03 | Disposition: A | Discharge status: Home / Self Care | Payer: 59 | Source: Ambulatory Visit | Attending: Radiation Oncology | Admitting: Radiation Oncology

## 2021-12-03 ENCOUNTER — Other Ambulatory Visit: Payer: Self-pay

## 2021-12-03 DIAGNOSIS — Z510 Encounter for antineoplastic radiation therapy: Secondary | ICD-10-CM | POA: Insufficient documentation

## 2021-12-03 DIAGNOSIS — C61 Malignant neoplasm of prostate: Secondary | ICD-10-CM | POA: Diagnosis present

## 2021-12-03 LAB — RAD ONC ARIA SESSION SUMMARY
Course Elapsed Days: 31
Plan Fractions Treated to Date: 23
Plan Prescribed Dose Per Fraction: 1.8 Gy
Plan Total Fractions Prescribed: 25
Plan Total Prescribed Dose: 45 Gy
Reference Point Dosage Given to Date: 41.4 Gy
Reference Point Session Dosage Given: 1.8 Gy
Session Number: 23

## 2021-12-04 ENCOUNTER — Other Ambulatory Visit: Payer: Self-pay

## 2021-12-04 ENCOUNTER — Ambulatory Visit
Admission: RE | Admit: 2021-12-04 | Discharge: 2021-12-04 | Disposition: A | Discharge status: Home / Self Care | Payer: 59 | Source: Ambulatory Visit | Attending: Radiation Oncology | Admitting: Radiation Oncology

## 2021-12-04 DIAGNOSIS — Z510 Encounter for antineoplastic radiation therapy: Secondary | ICD-10-CM | POA: Diagnosis not present

## 2021-12-04 LAB — RAD ONC ARIA SESSION SUMMARY
Course Elapsed Days: 32
Plan Fractions Treated to Date: 24
Plan Prescribed Dose Per Fraction: 1.8 Gy
Plan Total Fractions Prescribed: 25
Plan Total Prescribed Dose: 45 Gy
Reference Point Dosage Given to Date: 43.2 Gy
Reference Point Session Dosage Given: 1.8 Gy
Session Number: 24

## 2021-12-07 ENCOUNTER — Other Ambulatory Visit: Payer: Self-pay

## 2021-12-07 ENCOUNTER — Ambulatory Visit
Admission: RE | Admit: 2021-12-07 | Discharge: 2021-12-07 | Disposition: A | Discharge status: Home / Self Care | Payer: 59 | Source: Ambulatory Visit | Attending: Radiation Oncology | Admitting: Radiation Oncology

## 2021-12-07 DIAGNOSIS — Z510 Encounter for antineoplastic radiation therapy: Secondary | ICD-10-CM | POA: Diagnosis not present

## 2021-12-07 LAB — RAD ONC ARIA SESSION SUMMARY
Course Elapsed Days: 35
Plan Fractions Treated to Date: 25
Plan Prescribed Dose Per Fraction: 1.8 Gy
Plan Total Fractions Prescribed: 25
Plan Total Prescribed Dose: 45 Gy
Reference Point Dosage Given to Date: 45 Gy
Reference Point Session Dosage Given: 1.8 Gy
Session Number: 25

## 2021-12-08 ENCOUNTER — Ambulatory Visit
Admission: RE | Admit: 2021-12-08 | Discharge: 2021-12-08 | Disposition: A | Discharge status: Home / Self Care | Payer: 59 | Source: Ambulatory Visit | Attending: Radiation Oncology | Admitting: Radiation Oncology

## 2021-12-08 ENCOUNTER — Other Ambulatory Visit: Payer: Self-pay

## 2021-12-08 DIAGNOSIS — Z510 Encounter for antineoplastic radiation therapy: Secondary | ICD-10-CM | POA: Diagnosis not present

## 2021-12-08 LAB — RAD ONC ARIA SESSION SUMMARY
Course Elapsed Days: 36
Plan Fractions Treated to Date: 1
Plan Prescribed Dose Per Fraction: 2 Gy
Plan Total Fractions Prescribed: 15
Plan Total Prescribed Dose: 30 Gy
Reference Point Dosage Given to Date: 47 Gy
Reference Point Session Dosage Given: 2 Gy
Session Number: 26

## 2021-12-09 ENCOUNTER — Other Ambulatory Visit: Payer: Self-pay

## 2021-12-09 ENCOUNTER — Ambulatory Visit
Admission: RE | Admit: 2021-12-09 | Discharge: 2021-12-09 | Disposition: A | Discharge status: Home / Self Care | Payer: 59 | Source: Ambulatory Visit | Attending: Radiation Oncology | Admitting: Radiation Oncology

## 2021-12-09 DIAGNOSIS — Z510 Encounter for antineoplastic radiation therapy: Secondary | ICD-10-CM | POA: Diagnosis not present

## 2021-12-09 LAB — RAD ONC ARIA SESSION SUMMARY
Course Elapsed Days: 37
Plan Fractions Treated to Date: 2
Plan Prescribed Dose Per Fraction: 2 Gy
Plan Total Fractions Prescribed: 15
Plan Total Prescribed Dose: 30 Gy
Reference Point Dosage Given to Date: 49 Gy
Reference Point Session Dosage Given: 2 Gy
Session Number: 27

## 2021-12-10 ENCOUNTER — Other Ambulatory Visit: Payer: Self-pay | Admitting: Internal Medicine

## 2021-12-10 ENCOUNTER — Other Ambulatory Visit: Payer: Self-pay | Admitting: Lab

## 2021-12-10 ENCOUNTER — Other Ambulatory Visit: Payer: Self-pay | Admitting: Radiation Oncology

## 2021-12-10 ENCOUNTER — Other Ambulatory Visit: Payer: Self-pay

## 2021-12-10 ENCOUNTER — Ambulatory Visit
Admission: RE | Admit: 2021-12-10 | Discharge: 2021-12-10 | Disposition: A | Discharge status: Home / Self Care | Payer: 59 | Source: Ambulatory Visit | Attending: Radiation Oncology | Admitting: Radiation Oncology

## 2021-12-10 DIAGNOSIS — Z510 Encounter for antineoplastic radiation therapy: Secondary | ICD-10-CM | POA: Diagnosis not present

## 2021-12-10 DIAGNOSIS — I152 Hypertension secondary to endocrine disorders: Secondary | ICD-10-CM

## 2021-12-10 DIAGNOSIS — C61 Malignant neoplasm of prostate: Secondary | ICD-10-CM

## 2021-12-10 LAB — URINALYSIS, COMPLETE (UACMP) WITH MICROSCOPIC
Bacteria, UA: NONE SEEN
Bilirubin Urine: NEGATIVE
Glucose, UA: NEGATIVE mg/dL
Hgb urine dipstick: NEGATIVE
Ketones, ur: 5 mg/dL — AB
Leukocytes,Ua: NEGATIVE
Nitrite: NEGATIVE
Protein, ur: NEGATIVE mg/dL
Specific Gravity, Urine: 1.019 (ref 1.005–1.030)
pH: 5 (ref 5.0–8.0)

## 2021-12-10 LAB — RAD ONC ARIA SESSION SUMMARY
Course Elapsed Days: 38
Plan Fractions Treated to Date: 3
Plan Prescribed Dose Per Fraction: 2 Gy
Plan Total Fractions Prescribed: 15
Plan Total Prescribed Dose: 30 Gy
Reference Point Dosage Given to Date: 51 Gy
Reference Point Session Dosage Given: 2 Gy
Session Number: 28

## 2021-12-10 MED ORDER — TAMSULOSIN HCL 0.4 MG PO CAPS: 0.4000 mg | ORAL_CAPSULE | Freq: Every day | ORAL | 5 refills | Status: DC

## 2021-12-11 ENCOUNTER — Ambulatory Visit: Payer: 59 | Admitting: Physician Assistant

## 2021-12-11 ENCOUNTER — Ambulatory Visit
Admission: RE | Admit: 2021-12-11 | Discharge: 2021-12-11 | Disposition: A | Discharge status: Home / Self Care | Payer: 59 | Source: Ambulatory Visit | Attending: Radiation Oncology | Admitting: Radiation Oncology

## 2021-12-11 ENCOUNTER — Other Ambulatory Visit: Payer: Self-pay

## 2021-12-11 DIAGNOSIS — Z510 Encounter for antineoplastic radiation therapy: Secondary | ICD-10-CM | POA: Diagnosis not present

## 2021-12-11 LAB — URINE CULTURE: Culture: NO GROWTH

## 2021-12-11 LAB — RAD ONC ARIA SESSION SUMMARY
Course Elapsed Days: 39
Plan Fractions Treated to Date: 4
Plan Prescribed Dose Per Fraction: 2 Gy
Plan Total Fractions Prescribed: 15
Plan Total Prescribed Dose: 30 Gy
Reference Point Dosage Given to Date: 53 Gy
Reference Point Session Dosage Given: 2 Gy
Session Number: 29

## 2021-12-14 ENCOUNTER — Ambulatory Visit: Payer: Self-pay

## 2021-12-14 ENCOUNTER — Ambulatory Visit (INDEPENDENT_AMBULATORY_CARE_PROVIDER_SITE_OTHER): Payer: 59

## 2021-12-14 ENCOUNTER — Other Ambulatory Visit: Payer: Self-pay

## 2021-12-14 ENCOUNTER — Ambulatory Visit (INDEPENDENT_AMBULATORY_CARE_PROVIDER_SITE_OTHER): Payer: 59 | Admitting: Physician Assistant

## 2021-12-14 ENCOUNTER — Ambulatory Visit
Admission: RE | Admit: 2021-12-14 | Discharge: 2021-12-14 | Disposition: A | Discharge status: Home / Self Care | Payer: 59 | Source: Ambulatory Visit | Attending: Radiation Oncology | Admitting: Radiation Oncology

## 2021-12-14 ENCOUNTER — Encounter: Payer: Self-pay | Admitting: Physician Assistant

## 2021-12-14 DIAGNOSIS — G8929 Other chronic pain: Secondary | ICD-10-CM

## 2021-12-14 DIAGNOSIS — M25561 Pain in right knee: Secondary | ICD-10-CM | POA: Diagnosis not present

## 2021-12-14 DIAGNOSIS — M170 Bilateral primary osteoarthritis of knee: Secondary | ICD-10-CM

## 2021-12-14 DIAGNOSIS — M25562 Pain in left knee: Secondary | ICD-10-CM

## 2021-12-14 DIAGNOSIS — Z510 Encounter for antineoplastic radiation therapy: Secondary | ICD-10-CM | POA: Diagnosis not present

## 2021-12-14 LAB — RAD ONC ARIA SESSION SUMMARY
Course Elapsed Days: 42
Plan Fractions Treated to Date: 5
Plan Prescribed Dose Per Fraction: 2 Gy
Plan Total Fractions Prescribed: 15
Plan Total Prescribed Dose: 30 Gy
Reference Point Dosage Given to Date: 55 Gy
Reference Point Session Dosage Given: 2 Gy
Session Number: 30

## 2021-12-14 MED ORDER — BUPIVACAINE HCL 0.25 % IJ SOLN
2.0000 mL | INTRAMUSCULAR | Status: AC | PRN
  Administered 2021-12-14: 2 mL via INTRA_ARTICULAR

## 2021-12-14 MED ORDER — LIDOCAINE HCL 1 % IJ SOLN
2.0000 mL | INTRAMUSCULAR | Status: AC | PRN
  Administered 2021-12-14: 2 mL

## 2021-12-14 MED ORDER — METHYLPREDNISOLONE ACETATE 40 MG/ML IJ SUSP
40.0000 mg | INTRAMUSCULAR | Status: AC | PRN
  Administered 2021-12-14: 40 mg via INTRA_ARTICULAR

## 2021-12-14 MED ORDER — MELOXICAM 7.5 MG PO TABS: 7.5000 mg | ORAL_TABLET | Freq: Every day | ORAL | 1 refills | Status: DC

## 2021-12-14 MED ORDER — TRAMADOL HCL 50 MG PO TABS: 50.0000 mg | ORAL_TABLET | Freq: Four times a day (QID) | ORAL | 0 refills | Status: DC | PRN

## 2021-12-14 NOTE — Progress Notes (Signed)
Office Visit Note   Patient: Matthew Colon.           Date of Birth: 08-08-1955           MRN: [id number] Visit Date: 12/14/2021              Requested by: Ladell Pier, MD [place],  [place] [address] PCP: Ladell Pier, MD  Chief Complaint  Patient presents with  . Right Knee - Follow-up      HPI: Patient presents today with a chief complaint of bilateral knee pain.  He has a history of arthritis in his knees.  He was last seen and evaluated by Dr. Erlinda Hong for this in 2018.  He says his knees have continued to get worse.  He also mentions that he has significant shoulder pain on the left for many years this too has gotten worse.  Assessment & Plan: Visit Diagnoses:  1. Chronic pain of both knees     Plan: Knees were evaluated today.  He does have bone-on-bone tricompartmental changes and varus malalignment.  Discussed with him that his BMI is 42 or more and he would not be a candidate for joint replacement.  Certainly we could go forward with some steroid injections and he would like to do that today.  He also really thinks his left shoulder is a problem.  Looking back at an appointment with Dr. Marlou Sa he had in 2018 Dr. Marlou Sa did indicate him that his arthritis was bad enough for shoulder replacement surgery.  I have referred him back to Dr. Marlou Sa to discuss this again.  I will also give him some meloxicam instead of ibuprofen.  Also will give him a very few tramadol and he knows of the only ones that I am going to prescribe for him  Follow-Up Instructions: Return With Dr. Marlou Sa.   Ortho Exam  Patient is alert, oriented, no adenopathy, well-dressed, normal affect, normal respiratory effort. Bilateral knees no effusion no swelling no redness.  He does have varus alignment clinically.  He has grinding with range of motion.  Bilaterally most of his pain is focused on the medial joint  Imaging: XR KNEE 3 VIEW RIGHT  Result Date: 12/14/2021 Radiographs  of his right knee demonstrate tricompartmental osteoarthritis with most significant findings in the medial compartment.  He has varus alignment and bone-on-bone changes medially.  Periarticular osteophytes and sclerotic changes  XR KNEE 3 VIEW LEFT  Result Date: 12/14/2021 Three-view radiographs of his left knee demonstrate advanced tricompartmental arthritis.  Varus malalignment with bone-on-bone in the medial compartment with sclerotic changes and periarticular osteophytes  No images are attached to the encounter.  Labs: Lab Results  Component Value Date   HGBA1C 6.3 07/21/2021   HGBA1C 6.6 (H) 04/21/2021   HGBA1C 6.2 11/13/2020   ESRSEDRATE 31 (H) 03/30/2017   ESRSEDRATE 12 04/29/2014   CRP <0.8 03/30/2017   LABURIC 5.7 04/29/2014   REPTSTATUS 12/11/2021 FINAL 12/10/2021   CULT  12/10/2021    NO GROWTH Performed at [facility], [place] [address], [place], [place] [address]      Lab Results  Component Value Date   ALBUMIN 4.3 12/16/2020   ALBUMIN 3.6 05/06/2018   ALBUMIN 3.8 03/30/2017    Lab Results  Component Value Date   MG 1.3 (L) 03/30/2017   Lab Results  Component Value Date   VD25OH 16 (L) 08/18/2009    No results found for: "PREALBUMIN"  Latest Ref Rng & Units 05/06/2018    8:05 AM 12/05/2015    8:41 AM 03/14/2015   12:01 AM  CBC EXTENDED  WBC 4.0 - 10.5 K/uL 10.9  14.8  12.6   RBC 4.22 - 5.81 MIL/uL 4.09  4.98  4.69   Hemoglobin 13.0 - 17.0 g/dL 11.5  14.5  13.5   HCT 39.0 - 52.0 % 35.2  43.2  40.1   Platelets 150 - 400 K/uL 348  365  326   NEUT# 1.7 - 7.7 K/uL 7.0  11,100    Lymph# 0.7 - 4.0 K/uL 2.8  2,664       There is no height or weight on file to calculate BMI.  Orders:  Orders Placed This Encounter  Procedures  . XR KNEE 3 VIEW RIGHT  . XR KNEE 3 VIEW LEFT   Meds ordered this encounter  Medications  . meloxicam (MOBIC) 7.5 MG tablet    Sig: Take 1 tablet (7.5 mg total) by mouth daily.    Dispense:  30 tablet    Refill:  1  .  traMADol (ULTRAM) 50 MG tablet    Sig: Take 1 tablet (50 mg total) by mouth every 6 (six) hours as needed.    Dispense:  30 tablet    Refill:  0     Procedures: Large Joint Inj: bilateral knee on 12/14/2021 10:33 AM Indications: pain and diagnostic evaluation Details: 25 G 1.5 in needle, anterolateral approach  Arthrogram: No  Medications (Right): 2 mL lidocaine 1 %; 2 mL bupivacaine 0.25 %; 40 mg methylPREDNISolone acetate 40 MG/ML Medications (Left): 2 mL lidocaine 1 %; 2 mL bupivacaine 0.25 %; 40 mg methylPREDNISolone acetate 40 MG/ML Outcome: tolerated well, no immediate complications Procedure, treatment alternatives, risks and benefits explained, specific risks discussed. Consent was given by the patient.    Clinical Data: No additional findings.  ROS:  All other systems negative, except as noted in the HPI. Review of Systems  Objective: Vital Signs: There were no vitals taken for this visit.  Specialty Comments:  No specialty comments available.  PMFS History: Patient Active Problem List   Diagnosis Date Noted  . Drug-induced constipation 07/21/2021  . Malignant neoplasm of prostate ([place]) 07/21/2021  . Hyperlipidemia associated with type 2 diabetes mellitus ([place]) 11/13/2020  . Morbid obesity ([place]) 11/13/2020  . 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  . DDD (degenerative disc disease), cervical 09/21/2017  . DJD of acromioclavicular joint (Right) 09/21/2017  . DJD of acromioclavicular joint (Bilateral) 09/21/2017  . DJD of acromioclavicular joint (Left) 09/21/2017  . Osteoarthritis of glenohumeral joint (Left) 09/21/2017  . 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/2017  . Tricompartmental disease of knee (Bilateral) 09/21/2017  . Osteoarthritis of knees (Bilateral) 09/21/2017  . Suprapatellar effusion of knee (recurrent) (Right) 09/21/2017  . Calcaneal spur of feet (Bilateral) 09/21/2017  . Hypomagnesemia 09/20/2017  . Low testosterone 09/20/2017  . Chronic  foot pain (Bilateral) 09/20/2017  . Vitamin D deficiency 04/04/2017  . Chronic shoulder pain (Primary Area of Pain) (Left) 03/30/2017  . Chronic knee pain [facility] Area of Pain) (Left) 03/30/2017  . Disorder of bone, unspecified 03/30/2017  . Other reduced mobility 03/30/2017  . Other specified health status 03/30/2017  . Opioid use agreement exists 03/30/2017  . Opiate use 03/30/2017  . Chronic pain syndrome 03/30/2017  . Chronic upper extremity pain (Secondary Area of Pain) (Bilateral) 03/30/2017  . Elevated PSA 12/05/2015  . Noncompliance 12/05/2015  . Hypertension associated with diabetes ([place]) 03/14/2015  . Type 2  diabetes mellitus with diabetic mononeuropathy ([place]) 03/14/2015  . Encounter for health maintenance examination in adult 03/14/2015  . Screening for prostate cancer 03/14/2015  . Special screening for malignant neoplasms, colon 03/14/2015  . Hyperlipidemia 03/14/2015  . Nocturia 03/14/2015  . Urinary frequency 03/14/2015  . Need for prophylactic vaccination and inoculation against influenza 03/14/2015  . Need for prophylactic vaccination against Streptococcus pneumoniae (pneumococcus) 03/14/2015  . Need for Tdap vaccination 03/14/2015  . Chronic nausea 03/14/2015  . Gastroesophageal reflux disease without esophagitis 03/14/2015   Past Medical History:  Diagnosis Date  . Allergy   . Arthritis   . DM (diabetes mellitus) ([place])   . Foot pain, bilateral    referred to podiatry 06/2014  . GERD (gastroesophageal reflux disease)    long history of  . Hyperlipidemia   . Hypertension   . Polyarthralgia    shoulders, knees, ankles, wrists; neg rheum lab screen 04/2014  . Prostate cancer ([place])   . Type 2 diabetes mellitus with diabetic neuropathy ([place])   . Wears glasses     Family History  Problem Relation Age of Onset  . Diabetes Mother   . Hypertension Mother   . Other Father        murdered  . Diabetes Sister   . Arthritis Sister   . Heart disease Sister    . Diabetes Sister   . Diabetes Sister   . Cancer Neg Hx   . Stroke Neg Hx   . Prostate cancer Neg Hx        unknown  . Bladder Cancer Neg Hx        unkown  . Kidney cancer Neg Hx        unkown    Past Surgical History:  Procedure Laterality Date  . COLONOSCOPY     referral pending 03/2015  . NO PAST SURGERIES     as of 03/2015   Social History   Occupational History  . Occupation: switcher/ spotter/forklift  Tobacco Use  . Smoking status: Some Days    Types: Cigarettes  . Smokeless tobacco: Never  Vaping Use  . Vaping Use: Never used  Substance and Sexual Activity  . Alcohol use: Yes    Comment: occasional beer  . Drug use: No  . Sexual activity: Yes

## 2021-12-15 ENCOUNTER — Ambulatory Visit
Admission: RE | Admit: 2021-12-15 | Discharge: 2021-12-15 | Disposition: A | Discharge status: Home / Self Care | Payer: 59 | Source: Ambulatory Visit | Attending: Radiation Oncology | Admitting: Radiation Oncology

## 2021-12-15 ENCOUNTER — Other Ambulatory Visit: Payer: Self-pay

## 2021-12-15 DIAGNOSIS — Z510 Encounter for antineoplastic radiation therapy: Secondary | ICD-10-CM | POA: Diagnosis not present

## 2021-12-15 LAB — RAD ONC ARIA SESSION SUMMARY
Course Elapsed Days: 43
Plan Fractions Treated to Date: 6
Plan Prescribed Dose Per Fraction: 2 Gy
Plan Total Fractions Prescribed: 15
Plan Total Prescribed Dose: 30 Gy
Reference Point Dosage Given to Date: 57 Gy
Reference Point Session Dosage Given: 2 Gy
Session Number: 31

## 2021-12-16 ENCOUNTER — Ambulatory Visit
Admission: RE | Admit: 2021-12-16 | Discharge: 2021-12-16 | Disposition: A | Discharge status: Home / Self Care | Payer: 59 | Source: Ambulatory Visit | Attending: Radiation Oncology | Admitting: Radiation Oncology

## 2021-12-16 ENCOUNTER — Other Ambulatory Visit: Payer: Self-pay

## 2021-12-16 ENCOUNTER — Telehealth: Payer: Self-pay

## 2021-12-16 ENCOUNTER — Ambulatory Visit: Payer: 59 | Admitting: Physician Assistant

## 2021-12-16 DIAGNOSIS — Z510 Encounter for antineoplastic radiation therapy: Secondary | ICD-10-CM | POA: Diagnosis not present

## 2021-12-16 LAB — RAD ONC ARIA SESSION SUMMARY
Course Elapsed Days: 44
Plan Fractions Treated to Date: 7
Plan Prescribed Dose Per Fraction: 2 Gy
Plan Total Fractions Prescribed: 15
Plan Total Prescribed Dose: 30 Gy
Reference Point Dosage Given to Date: 59 Gy
Reference Point Session Dosage Given: 2 Gy
Session Number: 32

## 2021-12-16 NOTE — Telephone Encounter (Signed)
Called patient to inform him of urinalysis results.

## 2021-12-17 ENCOUNTER — Other Ambulatory Visit: Payer: Self-pay

## 2021-12-17 ENCOUNTER — Ambulatory Visit
Admission: RE | Admit: 2021-12-17 | Discharge: 2021-12-17 | Disposition: A | Discharge status: Home / Self Care | Payer: 59 | Source: Ambulatory Visit | Attending: Radiation Oncology | Admitting: Radiation Oncology

## 2021-12-17 ENCOUNTER — Other Ambulatory Visit: Payer: Self-pay | Admitting: Urology

## 2021-12-17 DIAGNOSIS — Z510 Encounter for antineoplastic radiation therapy: Secondary | ICD-10-CM | POA: Diagnosis not present

## 2021-12-17 LAB — RAD ONC ARIA SESSION SUMMARY
Course Elapsed Days: 45
Plan Fractions Treated to Date: 8
Plan Prescribed Dose Per Fraction: 2 Gy
Plan Total Fractions Prescribed: 15
Plan Total Prescribed Dose: 30 Gy
Reference Point Dosage Given to Date: 61 Gy
Reference Point Session Dosage Given: 2 Gy
Session Number: 33

## 2021-12-17 MED ORDER — TAMSULOSIN HCL 0.4 MG PO CAPS: 0.4000 mg | ORAL_CAPSULE | Freq: Two times a day (BID) | ORAL | 5 refills | Status: DC

## 2021-12-18 ENCOUNTER — Other Ambulatory Visit: Payer: Self-pay

## 2021-12-18 ENCOUNTER — Ambulatory Visit
Admission: RE | Admit: 2021-12-18 | Discharge: 2021-12-18 | Disposition: A | Discharge status: Home / Self Care | Payer: 59 | Source: Ambulatory Visit | Attending: Radiation Oncology | Admitting: Radiation Oncology

## 2021-12-18 DIAGNOSIS — Z510 Encounter for antineoplastic radiation therapy: Secondary | ICD-10-CM | POA: Diagnosis not present

## 2021-12-18 LAB — RAD ONC ARIA SESSION SUMMARY
Course Elapsed Days: 46
Plan Fractions Treated to Date: 9
Plan Prescribed Dose Per Fraction: 2 Gy
Plan Total Fractions Prescribed: 15
Plan Total Prescribed Dose: 30 Gy
Reference Point Dosage Given to Date: 63 Gy
Reference Point Session Dosage Given: 2 Gy
Session Number: 34

## 2021-12-21 ENCOUNTER — Other Ambulatory Visit: Payer: Self-pay

## 2021-12-21 ENCOUNTER — Ambulatory Visit
Admission: RE | Admit: 2021-12-21 | Discharge: 2021-12-21 | Disposition: A | Discharge status: Home / Self Care | Payer: 59 | Source: Ambulatory Visit | Attending: Radiation Oncology | Admitting: Radiation Oncology

## 2021-12-21 ENCOUNTER — Other Ambulatory Visit: Payer: Self-pay | Admitting: Internal Medicine

## 2021-12-21 DIAGNOSIS — Z510 Encounter for antineoplastic radiation therapy: Secondary | ICD-10-CM | POA: Diagnosis not present

## 2021-12-21 LAB — RAD ONC ARIA SESSION SUMMARY
Course Elapsed Days: 49
Plan Fractions Treated to Date: 10
Plan Prescribed Dose Per Fraction: 2 Gy
Plan Total Fractions Prescribed: 15
Plan Total Prescribed Dose: 30 Gy
Reference Point Dosage Given to Date: 65 Gy
Reference Point Session Dosage Given: 2 Gy
Session Number: 35

## 2021-12-21 NOTE — Telephone Encounter (Signed)
Requested medication (s) are due for refill today - no  Requested medication (s) are on the active medication list -yes  Future visit scheduled -yes  Last refill: 12/14/21 #30  Notes to clinic: duplicate, non delegated Rx, outside provider  Requested Prescriptions  Pending Prescriptions Disp Refills   traMADol (ULTRAM) 50 MG tablet [Pharmacy Med Name: TRAMADOL '50MG'$  TABLETS] 80 tablet     Sig: TAKE 1 TABLET(50 MG) BY MOUTH EVERY 8 HOURS AS NEEDED     Not Delegated - Analgesics:  Opioid Agonists Failed - 12/21/2021  9:31 AM      Failed - This refill cannot be delegated      Failed - Urine Drug Screen completed in last 360 days      Failed - Valid encounter within last 3 months    Recent Outpatient Visits           5 months ago Type 2 diabetes mellitus with peripheral neuropathy ([place])   [place] Ladell Pier, MD   8 months ago Need for influenza vaccination   [place], Annie Main L, RPH-CPP   9 months ago Type 2 diabetes mellitus with peripheral neuropathy [facility])   [place] Karle Plumber B, MD   11 months ago Hypertension associated with diabetes [facility])   [place], [name], RPH-CPP   1 year ago Hypertension associated with diabetes [facility])   [place], [name], RPH-CPP       Future Appointments             In 1 week Marlou Sa, [place]onna [place], MD [facility]   In 1 month [place], Dionne Bucy, PA-C [place]               Requested Prescriptions  Pending Prescriptions Disp Refills   traMADol (ULTRAM) 50 MG tablet [Pharmacy Med Name: TRAMADOL '50MG'$  TABLETS] 80 tablet     Sig: TAKE 1 TABLET(50 MG) BY MOUTH EVERY 8 HOURS AS NEEDED     Not Delegated - Analgesics:  Opioid Agonists Failed - 12/21/2021  9:31 AM      Failed - This  refill cannot be delegated      Failed - Urine Drug Screen completed in last 360 days      Failed - Valid encounter within last 3 months    Recent Outpatient Visits           5 months ago Type 2 diabetes mellitus with peripheral neuropathy ([place])   [place] Ladell Pier, MD   8 months ago Need for influenza vaccination   [place], Annie Main L, RPH-CPP   9 months ago Type 2 diabetes mellitus with peripheral neuropathy [facility])   [place], MD   11 months ago Hypertension associated with diabetes [facility])   [place], [name], RPH-CPP   1 year ago Hypertension associated with diabetes [facility])   [place], RPH-CPP       Future Appointments             In 1 week Marlou Sa, Tonna [place], MD [facility]   In 1 month [place], Casimer Bilis [facility]  [facility]

## 2021-12-21 NOTE — Telephone Encounter (Signed)
Requested medication (s) are due for refill today- no  Requested medication (s) are on the active medication list -yes  Future visit scheduled -yes  Last refill: 12/14/21 #30  Notes to clinic: non delegated Rx, outside provider  Requested Prescriptions  Pending Prescriptions Disp Refills   traMADol (ULTRAM) 50 MG tablet [Pharmacy Med Name: TRAMADOL '50MG'$  TABLETS] 80 tablet     Sig: TAKE 1 TABLET(50 MG) BY MOUTH EVERY 8 HOURS AS NEEDED     Not Delegated - Analgesics:  Opioid Agonists Failed - 12/21/2021  9:31 AM      Failed - This refill cannot be delegated      Failed - Urine Drug Screen completed in last 360 days      Failed - Valid encounter within last 3 months    Recent Outpatient Visits           5 months ago Type 2 diabetes mellitus with peripheral neuropathy ([place])   [place] Ladell Pier, MD   8 months ago Need for influenza vaccination   [place], Annie Main L, RPH-CPP   9 months ago Type 2 diabetes mellitus with peripheral neuropathy [facility])   [place] Karle Plumber B, MD   11 months ago Hypertension associated with diabetes [facility])   [place], [name], RPH-CPP   1 year ago Hypertension associated with diabetes [facility])   [place], [name], RPH-CPP       Future Appointments             In 1 week Marlou Sa, Tonna [place], MD [facility]   In 1 month [place], Dionne Bucy, PA-C [place]               Requested Prescriptions  Pending Prescriptions Disp Refills   traMADol (ULTRAM) 50 MG tablet [Pharmacy Med Name: TRAMADOL '50MG'$  TABLETS] 80 tablet     Sig: TAKE 1 TABLET(50 MG) BY MOUTH EVERY 8 HOURS AS NEEDED     Not Delegated - Analgesics:  Opioid Agonists Failed - 12/21/2021  9:31 AM      Failed - This refill cannot be  delegated      Failed - Urine Drug Screen completed in last 360 days      Failed - Valid encounter within last 3 months    Recent Outpatient Visits           5 months ago Type 2 diabetes mellitus with peripheral neuropathy ([place])   [place] Ladell Pier, MD   8 months ago Need for influenza vaccination   [place], Annie Main L, RPH-CPP   9 months ago Type 2 diabetes mellitus with peripheral neuropathy [facility])   [place], MD   11 months ago Hypertension associated with diabetes [facility])   [place], [name], RPH-CPP   1 year ago Hypertension associated with diabetes [facility])   [place], RPH-CPP       Future Appointments             In 1 week Marlou Sa, Tonna [place], MD [facility]   In 1 month [place], Casimer Bilis [place]  [facility]ness

## 2021-12-21 NOTE — Telephone Encounter (Signed)
Pt is calling in requesting an update on medication.

## 2021-12-22 ENCOUNTER — Ambulatory Visit
Admission: RE | Admit: 2021-12-22 | Discharge: 2021-12-22 | Disposition: A | Discharge status: Home / Self Care | Payer: 59 | Source: Ambulatory Visit | Attending: Radiation Oncology | Admitting: Radiation Oncology

## 2021-12-22 ENCOUNTER — Other Ambulatory Visit: Payer: Self-pay

## 2021-12-22 DIAGNOSIS — Z510 Encounter for antineoplastic radiation therapy: Secondary | ICD-10-CM | POA: Diagnosis not present

## 2021-12-22 LAB — RAD ONC ARIA SESSION SUMMARY
Course Elapsed Days: 50
Plan Fractions Treated to Date: 11
Plan Prescribed Dose Per Fraction: 2 Gy
Plan Total Fractions Prescribed: 15
Plan Total Prescribed Dose: 30 Gy
Reference Point Dosage Given to Date: 67 Gy
Reference Point Session Dosage Given: 2 Gy
Session Number: 36

## 2021-12-23 ENCOUNTER — Ambulatory Visit
Admission: RE | Admit: 2021-12-23 | Discharge: 2021-12-23 | Disposition: A | Discharge status: Home / Self Care | Payer: 59 | Source: Ambulatory Visit | Attending: Radiation Oncology | Admitting: Radiation Oncology

## 2021-12-23 ENCOUNTER — Other Ambulatory Visit: Payer: Self-pay

## 2021-12-23 DIAGNOSIS — Z510 Encounter for antineoplastic radiation therapy: Secondary | ICD-10-CM | POA: Diagnosis not present

## 2021-12-23 LAB — RAD ONC ARIA SESSION SUMMARY
Course Elapsed Days: 51
Plan Fractions Treated to Date: 12
Plan Prescribed Dose Per Fraction: 2 Gy
Plan Total Fractions Prescribed: 15
Plan Total Prescribed Dose: 30 Gy
Reference Point Dosage Given to Date: 69 Gy
Reference Point Session Dosage Given: 2 Gy
Session Number: 37

## 2021-12-24 ENCOUNTER — Other Ambulatory Visit: Payer: Self-pay

## 2021-12-24 ENCOUNTER — Ambulatory Visit
Admission: RE | Admit: 2021-12-24 | Discharge: 2021-12-24 | Disposition: A | Discharge status: Home / Self Care | Payer: 59 | Source: Ambulatory Visit | Attending: Radiation Oncology | Admitting: Radiation Oncology

## 2021-12-24 DIAGNOSIS — Z510 Encounter for antineoplastic radiation therapy: Secondary | ICD-10-CM | POA: Diagnosis not present

## 2021-12-24 LAB — RAD ONC ARIA SESSION SUMMARY
Course Elapsed Days: 52
Plan Fractions Treated to Date: 13
Plan Prescribed Dose Per Fraction: 2 Gy
Plan Total Fractions Prescribed: 15
Plan Total Prescribed Dose: 30 Gy
Reference Point Dosage Given to Date: 71 Gy
Reference Point Session Dosage Given: 2 Gy
Session Number: 38

## 2021-12-25 ENCOUNTER — Other Ambulatory Visit: Payer: Self-pay

## 2021-12-25 ENCOUNTER — Ambulatory Visit
Admission: RE | Admit: 2021-12-25 | Discharge: 2021-12-25 | Disposition: A | Discharge status: Home / Self Care | Payer: 59 | Source: Ambulatory Visit | Attending: Radiation Oncology | Admitting: Radiation Oncology

## 2021-12-25 DIAGNOSIS — Z510 Encounter for antineoplastic radiation therapy: Secondary | ICD-10-CM | POA: Diagnosis not present

## 2021-12-25 LAB — RAD ONC ARIA SESSION SUMMARY
Course Elapsed Days: 53
Plan Fractions Treated to Date: 14
Plan Prescribed Dose Per Fraction: 2 Gy
Plan Total Fractions Prescribed: 15
Plan Total Prescribed Dose: 30 Gy
Reference Point Dosage Given to Date: 73 Gy
Reference Point Session Dosage Given: 2 Gy
Session Number: 39

## 2021-12-27 ENCOUNTER — Telehealth: Payer: Self-pay | Admitting: Internal Medicine

## 2021-12-28 ENCOUNTER — Other Ambulatory Visit: Payer: Self-pay

## 2021-12-28 ENCOUNTER — Ambulatory Visit
Admission: RE | Admit: 2021-12-28 | Discharge: 2021-12-28 | Disposition: A | Discharge status: Home / Self Care | Payer: 59 | Source: Ambulatory Visit | Attending: Radiation Oncology | Admitting: Radiation Oncology

## 2021-12-28 ENCOUNTER — Other Ambulatory Visit: Payer: Self-pay | Admitting: Internal Medicine

## 2021-12-28 ENCOUNTER — Encounter: Payer: Self-pay | Admitting: Urology

## 2021-12-28 DIAGNOSIS — K219 Gastro-esophageal reflux disease without esophagitis: Secondary | ICD-10-CM

## 2021-12-28 DIAGNOSIS — Z510 Encounter for antineoplastic radiation therapy: Secondary | ICD-10-CM | POA: Diagnosis not present

## 2021-12-28 DIAGNOSIS — C61 Malignant neoplasm of prostate: Secondary | ICD-10-CM

## 2021-12-28 LAB — RAD ONC ARIA SESSION SUMMARY
Course Elapsed Days: 56
Plan Fractions Treated to Date: 15
Plan Prescribed Dose Per Fraction: 2 Gy
Plan Total Fractions Prescribed: 15
Plan Total Prescribed Dose: 30 Gy
Reference Point Dosage Given to Date: 75 Gy
Reference Point Session Dosage Given: 2 Gy
Session Number: 40

## 2021-12-28 NOTE — Telephone Encounter (Signed)
Pt was called and informed of medication refill. His appointment with Marylene Land on 01/20/2022 has been moved to 01/21/2022 with PCP

## 2021-12-30 ENCOUNTER — Ambulatory Visit: Payer: 59 | Admitting: Orthopedic Surgery

## 2022-01-12 ENCOUNTER — Other Ambulatory Visit: Payer: Self-pay | Admitting: Internal Medicine

## 2022-01-12 DIAGNOSIS — E1142 Type 2 diabetes mellitus with diabetic polyneuropathy: Secondary | ICD-10-CM

## 2022-01-12 DIAGNOSIS — E1159 Type 2 diabetes mellitus with other circulatory complications: Secondary | ICD-10-CM

## 2022-01-12 NOTE — Telephone Encounter (Signed)
Medication Refill - Medication:olmesartan (BENICAR) 20 MG/metFORMIN (GLUCOPHAGE-XR) 500 MG 24 hr tablet  Patient only has 2 left of metformin and 1 left of the olmesartan Has the patient contacted their pharmacy? yes (Agent: If no, request that the patient contact the pharmacy for the refill. If patient does not wish to contact the pharmacy document the reason why and proceed with request.) (Agent: If yes, when and what did the pharmacy advise?)contact pcp  Preferred Pharmacy (with phone number or street name):  [facility] [place] Phone:  [phone]  Fax:  [phone]     Has the patient been seen for an appointment in the last year OR does the patient have an upcoming appointment? yes  Agent: Please be advised that RX refills may take up to 3 business days. We ask that you follow-up with your pharmacy.

## 2022-01-13 ENCOUNTER — Ambulatory Visit: Payer: 59 | Admitting: Podiatry

## 2022-01-13 MED ORDER — OLMESARTAN MEDOXOMIL 20 MG PO TABS: 20.0000 mg | ORAL_TABLET | Freq: Every day | ORAL | 0 refills | Status: DC

## 2022-01-13 MED ORDER — METFORMIN HCL ER 500 MG PO TB24: 500.0000 mg | ORAL_TABLET | Freq: Every day | ORAL | 0 refills | Status: DC

## 2022-01-13 NOTE — Telephone Encounter (Signed)
Requested medication (s) are due for refill today: yes  Requested medication (s) are on the active medication list: yes  Last refill:  Olmesartan 12/10/21 #30 and 0 RF, Metformin 12/01/21 #60 with 0 RF   Future visit scheduled: 01/21/22  Notes to clinic:  Pt does have appt next week but labs are over 12 months, please assess.      Requested Prescriptions  Pending Prescriptions Disp Refills   olmesartan (BENICAR) 20 MG tablet 30 tablet 0    Sig: Take 1 tablet (20 mg total) by mouth daily.     Cardiovascular:  Angiotensin Receptor Blockers Failed - 01/12/2022  3:02 PM      Failed - Cr in normal range and within 180 days    Creatinine  Date Value Ref Range Status  11/13/2020 142.3 20.0 - 300.0 mg/dL Final   Creat  Date Value Ref Range Status  12/05/2015 1.18 0.70 - 1.33 mg/dL Final   Creatinine, Ser  Date Value Ref Range Status  12/16/2020 1.19 0.76 - 1.27 mg/dL Final         Failed - K in normal range and within 180 days    Potassium  Date Value Ref Range Status  12/16/2020 4.4 3.5 - 5.2 mmol/L Final         Failed - Last BP in normal range    BP Readings from Last 1 Encounters:  10/19/21 (!) 150/88         Passed - Patient is not pregnant      Passed - Valid encounter within last 6 months    Recent Outpatient Visits           5 months ago Type 2 diabetes mellitus with peripheral neuropathy ([place])   [place] Ladell Pier, MD   8 months ago Need for influenza vaccination   [place], Annie Main L, RPH-CPP   10 months ago Type 2 diabetes mellitus with peripheral neuropathy [facility])   [place] Ladell Pier, MD   12 months ago Hypertension associated with diabetes [facility])   [place], [name], RPH-CPP   1 year ago Hypertension associated with diabetes [facility])   [place], RPH-CPP       Future Appointments             In 1 week Marlou Sa, Tonna Corner, MD [place]   In 1 week Ladell Pier, MD [place]             metFORMIN (GLUCOPHAGE-XR) 500 MG 24 hr tablet 60 tablet 0    Sig: Take 1 tablet (500 mg total) by mouth daily with breakfast.     Endocrinology:  Diabetes - Biguanides Failed - 01/12/2022  3:02 PM      Failed - Cr in normal range and within 360 days    Creatinine  Date Value Ref Range Status  11/13/2020 142.3 20.0 - 300.0 mg/dL Final   Creat  Date Value Ref Range Status  12/05/2015 1.18 0.70 - 1.33 mg/dL Final   Creatinine, Ser  Date Value Ref Range Status  12/16/2020 1.19 0.76 - 1.27 mg/dL Final         Failed - eGFR in normal range and within 360 days    GFR calc Af Wyvonnia Lora  Date Value Ref Range Status  05/06/2018 >60 >60 mL/min Final    Comment:    (NOTE) The eGFR has been calculated using the CKD EPI equation. This calculation has not been validated in all clinical situations. eGFR's persistently <60 mL/min signify possible Chronic Kidney Disease.    GFR calc non Af Amer  Date Value Ref Range Status  05/06/2018 >60 >60 mL/min Final   eGFR  Date Value Ref Range Status  12/16/2020 69 >59 mL/min/1.73 Final         Failed - B12 Level in normal range and within 720 days    Vitamin B-12  Date Value Ref Range Status  03/30/2017 244 180 - 914 pg/mL Final    Comment:    (NOTE) This assay is not validated for testing neonatal or myeloproliferative syndrome specimens for Vitamin B12 levels. Performed at [facility], [place] [address] [place], [place] [address]          Failed - CBC within normal limits and completed in the last 12 months    WBC  Date Value Ref Range Status  05/06/2018 10.9 (H) 4.0 - 10.5 K/uL Final   RBC  Date Value Ref Range Status  05/06/2018 4.09 (L) 4.22 - 5.81 MIL/uL Final   Hemoglobin  Date Value Ref Range  Status  05/06/2018 11.5 (L) 13.0 - 17.0 g/dL Final   HCT  Date Value Ref Range Status  05/06/2018 35.2 (L) 39.0 - 52.0 % Final   MCHC  Date Value Ref Range Status  05/06/2018 32.7 30.0 - 36.0 g/dL Final   [facility]  Date Value Ref Range Status  05/06/2018 28.1 26.0 - 34.0 pg Final   MCV  Date Value Ref Range Status  05/06/2018 86.1 80.0 - 100.0 fL Final   No results found for: "PLTCOUNTKUC", "LABPLAT", "POCPLA" RDW  Date Value Ref Range Status  05/06/2018 14.1 11.5 - 15.5 % Final         Passed - HBA1C is between 0 and 7.9 and within 180 days    HbA1c, POC (controlled diabetic range)  Date Value Ref Range Status  07/21/2021 6.3 0.0 - 7.0 % Final         Passed - Valid encounter within last 6 months    Recent Outpatient Visits           5 months ago Type 2 diabetes mellitus with peripheral neuropathy ([place])   [place], Deborah B, MD   8 months ago Need for influenza vaccination   [place], [name], RPH-CPP   10 months ago Type 2 diabetes mellitus with peripheral neuropathy [facility])   [place], MD   12 months ago Hypertension associated with diabetes [facility])   [place], [name], RPH-CPP   1 year ago Hypertension associated with diabetes [facility])   [place], [name], RPH-CPP       Future Appointments             In 1 week Marlou Sa, Tonna Corner, MD [place]   In 1 week Ladell Pier, MD [place]

## 2022-01-19 ENCOUNTER — Other Ambulatory Visit: Payer: 59

## 2022-01-19 ENCOUNTER — Ambulatory Visit: Payer: 59 | Admitting: Oncology

## 2022-01-19 ENCOUNTER — Inpatient Hospital Stay: Payer: 59 | Attending: Oncology

## 2022-01-19 ENCOUNTER — Inpatient Hospital Stay: Payer: 59 | Admitting: Oncology

## 2022-01-20 ENCOUNTER — Ambulatory Visit: Payer: 59 | Admitting: Orthopedic Surgery

## 2022-01-20 ENCOUNTER — Ambulatory Visit: Payer: 59 | Admitting: Physician Assistant

## 2022-01-21 ENCOUNTER — Ambulatory Visit: Payer: 59 | Attending: Physician Assistant | Admitting: Internal Medicine

## 2022-01-21 ENCOUNTER — Encounter: Payer: Self-pay | Admitting: Internal Medicine

## 2022-01-21 DIAGNOSIS — Z79891 Long term (current) use of opiate analgesic: Secondary | ICD-10-CM | POA: Diagnosis not present

## 2022-01-21 DIAGNOSIS — Z7729 Contact with and (suspected ) exposure to other hazardous substances: Secondary | ICD-10-CM

## 2022-01-21 DIAGNOSIS — E1169 Type 2 diabetes mellitus with other specified complication: Secondary | ICD-10-CM

## 2022-01-21 DIAGNOSIS — E1159 Type 2 diabetes mellitus with other circulatory complications: Secondary | ICD-10-CM | POA: Diagnosis not present

## 2022-01-21 DIAGNOSIS — M170 Bilateral primary osteoarthritis of knee: Secondary | ICD-10-CM | POA: Diagnosis not present

## 2022-01-21 DIAGNOSIS — I152 Hypertension secondary to endocrine disorders: Secondary | ICD-10-CM

## 2022-01-21 DIAGNOSIS — C61 Malignant neoplasm of prostate: Secondary | ICD-10-CM

## 2022-01-21 DIAGNOSIS — E785 Hyperlipidemia, unspecified: Secondary | ICD-10-CM

## 2022-01-21 LAB — GLUCOSE, POCT (MANUAL RESULT ENTRY): POC Glucose: 157 mg/dl — AB (ref 70–99)

## 2022-01-21 LAB — POCT GLYCOSYLATED HEMOGLOBIN (HGB A1C): HbA1c, POC (controlled diabetic range): 5.9 % (ref 0.0–7.0)

## 2022-01-21 MED ORDER — TRAMADOL HCL 50 MG PO TABS: ORAL_TABLET | ORAL | 1 refills | Status: DC

## 2022-01-21 MED ORDER — LORATADINE 10 MG PO TABS: 10.0000 mg | ORAL_TABLET | Freq: Every day | ORAL | 1 refills | Status: DC

## 2022-01-21 NOTE — Progress Notes (Signed)
Patient ID: Matthew Range Sr., male    DOB: 07/28/1955  MRN: [id number]  CC: Diabetes, Hypertension, and Medication Refill   Subjective: Matthew Colon is a 66 y.o. male who presents for chronic ds management His concerns today include:  Patient with history of HTN, DM peripheral neuropathy, HL, GERD, OA BL knees and LT shoulder, elevated PSA, ED, BPH, vitamin D deficiency,former smoker    DM/obesity: Results for orders placed or performed in visit on 01/21/22  POCT glucose (manual entry)  Result Value Ref Range   POC Glucose 157 (A) 70 - 99 mg/dl  POCT glycosylated hemoglobin (Hb A1C)  Result Value Ref Range   Hemoglobin A1C     HbA1c POC (<> result, manual entry)     HbA1c, POC (prediabetic range)     HbA1c, POC (controlled diabetic range) 5.9 0.0 - 7.0 %  Reports compliance with metformin. Does not check blood sugars often. Feels he is out of control with his eating.  Feels hungry all the time and loves sweets. Goes to the gym but not as often and consistently.  HTN: Taking Benicar as prescribed and took it already for today.  No device to check blood pressure.  Not limiting salt in the food as much as he should.  HL: Taking and tolerating Lipitor.  OA of the knees: Saw Ortho 12/14/2021.  Given injection to the knees.  It helped some.  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  Takes tramadol once he gets off of work and finds it helpful.  Tramadol causes some constipation for which he takes a stool softener.  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  Treated with XRT and ongoing androgen deprivation therapy every 6 months.  Next shot is sometime next week.  Feeling bad and tired since hormone and XRT.   Out of work since the 18th of this month due to feeling bad for the last 3 to 4 days.  Having hot flashes from the androgen deprivation shot. Drives a forklift in a warehouse.  Bothered by dust  on the floor the last time he worked.  He has had dry cough, sneezing, itchy eyes and rhinorrhea.  On Flonase.  Does not wear a mask at work.  Patient Active Problem List   Diagnosis Date Noted   Drug-induced constipation 07/21/2021   Malignant neoplasm of prostate ([place]) 07/21/2021   Hyperlipidemia associated with type 2 diabetes mellitus ([place]) 11/13/2020   Morbid obesity ([place]) 11/13/2020   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   DDD (degenerative disc disease), cervical 09/21/2017   DJD of acromioclavicular joint (Right) 09/21/2017   DJD of acromioclavicular joint (Bilateral) 09/21/2017   DJD of acromioclavicular joint (Left) 09/21/2017   Osteoarthritis of glenohumeral joint (Left) 09/21/2017   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/2017   Tricompartmental disease of knee (Bilateral) 09/21/2017   Osteoarthritis of knees (Bilateral) 09/21/2017   Suprapatellar effusion of knee (recurrent) (Right) 09/21/2017   Calcaneal spur of feet (Bilateral) 09/21/2017   Hypomagnesemia 09/20/2017   Low testosterone 09/20/2017   Chronic foot pain (Bilateral) 09/20/2017   Vitamin D deficiency 04/04/2017   Chronic shoulder pain (Primary Area of Pain) (Left) 03/30/2017   Chronic knee pain [facility] Area of Pain) (Left) 03/30/2017   Disorder of bone, unspecified 03/30/2017   Other reduced mobility 03/30/2017   Other specified health status 03/30/2017   Opioid use agreement exists 03/30/2017   Opiate use 03/30/2017   Chronic  pain syndrome 03/30/2017   Chronic upper extremity pain (Secondary Area of Pain) (Bilateral) 03/30/2017   Elevated PSA 12/05/2015   Noncompliance 12/05/2015   Hypertension associated with diabetes ([place]) 03/14/2015   Type 2 diabetes mellitus with diabetic mononeuropathy ([place]) 03/14/2015   Encounter for health maintenance examination in adult 03/14/2015   Screening for prostate cancer 03/14/2015   Special screening for malignant neoplasms, colon 03/14/2015   Hyperlipidemia  03/14/2015   Nocturia 03/14/2015   Urinary frequency 03/14/2015   Need for prophylactic vaccination and inoculation against influenza 03/14/2015   Need for prophylactic vaccination against Streptococcus pneumoniae (pneumococcus) 03/14/2015   Need for Tdap vaccination 03/14/2015   Chronic nausea 03/14/2015   Gastroesophageal reflux disease without esophagitis 03/14/2015     Current Outpatient Medications on File Prior to Visit  Medication Sig Dispense Refill   atorvastatin (LIPITOR) 80 MG tablet Take 1 tablet (80 mg total) by mouth daily. 90 tablet 3   Continuous Blood Gluc Sensor (FREESTYLE LIBRE SENSOR SYSTEM) MISC Change sensor Q 2 wks 2 each 12   fluticasone (FLONASE) 50 MCG/ACT nasal spray Place 1 spray into both nostrils daily. 16 g 2   linaclotide (LINZESS) 145 MCG CAPS capsule Take 1 capsule (145 mcg total) by mouth daily before breakfast. 30 capsule 1   meloxicam (MOBIC) 7.5 MG tablet Take 1 tablet (7.5 mg total) by mouth daily. 30 tablet 1   metFORMIN (GLUCOPHAGE-XR) 500 MG 24 hr tablet Take 1 tablet (500 mg total) by mouth daily with breakfast. 60 tablet 0   olmesartan (BENICAR) 20 MG tablet Take 1 tablet (20 mg total) by mouth daily. 30 tablet 0   pantoprazole (PROTONIX) 20 MG tablet Take 1 tablet by mouth once daily 90 tablet 0   pregabalin (LYRICA) 75 MG capsule Take 1 capsule (75 mg total) by mouth 2 (two) times daily. 30 capsule 0   senna (SENOKOT) 8.6 MG tablet Take 1 tablet (8.6 mg total) by mouth daily as needed for constipation. 30 tablet 3   sildenafil (VIAGRA) 50 MG tablet Take 1-2 tablets (50-100 mg total) by mouth daily as needed for erectile dysfunction. 15 tablet 11   tamsulosin (FLOMAX) 0.4 MG CAPS capsule Take 1 capsule (0.4 mg total) by mouth 2 (two) times daily after a meal. 60 capsule 5   diazepam (VALIUM) 10 MG tablet Take 1 tablet (10 mg total) by mouth once as needed for up to 1 dose for anxiety (take 30 minutes prior to prostate biopsy). (Patient not taking:  Reported on 09/30/2021) 1 tablet 0   No current facility-administered medications on file prior to visit.    No Known Allergies  Social History   Socioeconomic History   Marital status: Divorced    Spouse name: Not on file   Number of children: 3   Years of education: Not on file   Highest education level: Not on file  Occupational History   Occupation: switcher/ spotter/forklift  Tobacco Use   Smoking status: Some Days    Types: Cigarettes   Smokeless tobacco: Never  Vaping Use   Vaping Use: Never used  Substance and Sexual Activity   Alcohol use: Yes    Comment: occasional beer   Drug use: No   Sexual activity: Yes  Other Topics Concern   Not on file  Social History Narrative   Lives with his sister.   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   Exercise - active on the job.  Has 3 children in [place].  Social Determinants of Health   Financial Resource Strain: Not on file  Food Insecurity: Not on file  Transportation Needs: Not on file  Physical Activity: Not on file  Stress: Not on file  Social Connections: Not on file  Intimate Partner Violence: Not on file    Family History  Problem Relation Age of Onset   Diabetes Mother    Hypertension Mother    Other Father        murdered   Diabetes Sister    Arthritis Sister    Heart disease Sister    Diabetes Sister    Diabetes Sister    Cancer Neg Hx    Stroke Neg Hx    Prostate cancer Neg Hx        unknown   Bladder Cancer Neg Hx        unkown   Kidney cancer Neg Hx        unkown    Past Surgical History:  Procedure Laterality Date   COLONOSCOPY     referral pending 03/2015   NO PAST SURGERIES     as of 03/2015    ROS: Review of Systems Negative except as stated above  PHYSICAL EXAM: BP 132/78   Pulse 89   Wt 288 lb (130.6 kg)   SpO2 99%   BMI 43.79 kg/m   Wt Readings from Last 3 Encounters:  01/21/22 288 lb (130.6 kg)  10/19/21 292 lb 12.8 oz (132.8 kg)  09/30/21 289 lb (131.1 kg)     Physical Exam  General appearance - alert, well appearing, obese older AAM and in no distress Mental status - normal mood, behavior, speech, dress, motor activity, and thought processes Nose -mild enlargement of nasal turbinates Mouth - mucous membranes moist, pharynx normal without lesions Neck - supple, no significant adenopathy Chest - clear to auscultation, no wheezes, rales or rhonchi, symmetric air entry Heart - normal rate, regular rhythm, normal S1, S2, no murmurs, rubs, clicks or gallops Extremities - peripheral pulses normal, no pedal edema, no clubbing or cyanosis      Latest Ref Rng & Units 12/16/2020    9:13 AM 05/06/2018    8:05 AM 03/30/2017    9:42 AM  CMP  Glucose 65 - 99 mg/dL 86  102  108   BUN 8 - 27 mg/dL '11  22  21   '$ Creatinine 0.76 - 1.27 mg/dL 1.19  1.24  1.11   Sodium 134 - 144 mmol/L 141  140  143   Potassium 3.5 - 5.2 mmol/L 4.4  3.7  4.1   Chloride 96 - 106 mmol/L 103  108  108   CO2 20 - 29 mmol/L '21  24  25   '$ Calcium 8.6 - 10.2 mg/dL 9.8  9.3  9.7   Total Protein 6.0 - 8.5 g/dL 6.8  6.5  7.1   Total Bilirubin 0.0 - 1.2 mg/dL 0.4  0.4  0.5   Alkaline Phos 44 - 121 IU/L 93  55  53   AST 0 - 40 IU/L '17  17  26   '$ ALT 0 - 44 IU/L '7  8  10    '$ Lipid Panel     Component Value Date/Time   CHOL 231 (H) 05/23/2018 0922   TRIG 50 05/23/2018 0922   HDL 75 05/23/2018 0922   CHOLHDL 3.1 05/23/2018 0922   CHOLHDL 2.8 03/14/2015 0001   VLDL 17 03/14/2015 0001   LDLCALC 146 (H) 05/23/2018 0922    CBC  Component Value Date/Time   WBC 10.9 (H) 05/06/2018 0805   RBC 4.09 (L) 05/06/2018 0805   HGB 11.5 (L) 05/06/2018 0805   HCT 35.2 (L) 05/06/2018 0805   PLT 348 05/06/2018 0805   MCV 86.1 05/06/2018 0805   MCH 28.1 05/06/2018 0805   MCHC 32.7 05/06/2018 0805   RDW 14.1 05/06/2018 0805   LYMPHSABS 2.8 05/06/2018 0805   MONOABS 0.8 05/06/2018 0805   EOSABS 0.2 05/06/2018 0805   BASOSABS 0.1 05/06/2018 0805    ASSESSMENT AND PLAN: 1. Type 2  diabetes mellitus with morbid obesity (HCC) At goal.  Continue metformin. Discussed and encourage healthy eating habits.  Advised to avoid keeping junk snacks in the house.  Encouraged him to get in as much exercise as his knees would allow.  He plans to get back to the gym a few times a week to walk on the treadmill. - CBC - Comprehensive metabolic panel - Lipid panel - Microalbumin / creatinine urine ratio - POCT glucose (manual entry) - POCT glycosylated hemoglobin (Hb A1C)  2. Hypertension associated with diabetes ([place]) Repeat blood pressure today is better and much closer to goal with goal being 130/80 all lower. Continue Benicar 20 mg daily  3. Hyperlipidemia associated with type 2 diabetes mellitus (HCC) Continue atorvastatin 80 mg daily.  Due for lipid profile checked today.  4. Osteoarthritis of knees (Bilateral) Advised that he is due for updated controlled substance prescribing agreement and random urine drug screen.  Patient initially states he would not be able to produce a urine.  I told him that we can do the drug screen by blood draw if he feels he is unable to produce a urine.  He then stated that he feels tired and was hoping not to have any blood drawn today.  I told him that failure to do a urine drug screen or drug screen by blood draw will result in me no longer being able to prescribe tramadol for him.  Patient then stated that he will try to produce some urine today. -He reports benefit from using the tramadol in terms of pain control and allowing him to be functional and to continue to work. [place] reviewed. - traMADol (ULTRAM) 50 MG tablet; TAKE 1 TABLET(50 MG) BY MOUTH EVERY 8 HOURS AS NEEDED  Dispense: 80 tablet; Refill: 1  5. Opioid use agreement exists Went over controlled substance prescribing agreement with him today.  This was updated. - [id number]+Oxyco+Alc+Crt-Bund  6. Dust exposure Continue Flonase as needed.  Add Claritin.  Advised to wear a mask when  in dusty environment - loratadine (CLARITIN) 10 MG tablet; Take 1 tablet (10 mg total) by mouth daily.  Dispense: 30 tablet; Refill: 1  7. Malignant neoplasm of prostate [facility]) Followed by oncology and urology.  Still actively being treated with androgen deprivation injection every 60-month  Completed XRT.    Patient was given the opportunity to ask questions.  Patient verbalized understanding of the plan and was able to repeat key elements of the plan.   This documentation was completed using DRadio producer  Any transcriptional errors are unintentional.  Orders Placed This Encounter  Procedures   CBC   Comprehensive metabolic panel   Lipid panel   Microalbumin / creatinine urine ratio   [id number]+Oxyco+Alc+Crt-Bund   POCT glucose (manual entry)   POCT glycosylated hemoglobin (Hb A1C)     Requested Prescriptions   Signed Prescriptions Disp Refills   loratadine (CLARITIN) 10 MG tablet 30  tablet 1    Sig: Take 1 tablet (10 mg total) by mouth daily.   traMADol (ULTRAM) 50 MG tablet 80 tablet 1    Sig: TAKE 1 TABLET(50 MG) BY MOUTH EVERY 8 HOURS AS NEEDED    Return in about 4 months (around 05/24/2022).  Karle Plumber, MD, FACP

## 2022-01-22 LAB — MICROALBUMIN / CREATININE URINE RATIO
Creatinine, Urine: 61.2 mg/dL
Microalb/Creat Ratio: <5 mg/g creat (ref 0–29)
Microalbumin, Urine: <3 ug/mL

## 2022-01-27 ENCOUNTER — Encounter: Payer: Self-pay | Admitting: Urology

## 2022-01-27 NOTE — Progress Notes (Signed)
  Radiation Oncology         [id number]) [phone] ________________________________  Name: Nancy Marus Sr. MRN: [id number]  Date: 12/28/2021  DOB: [birth year]  End of Treatment Note  Diagnosis:   66 year old male with oligometastatic, Gleason 5+5 adenocarcinoma of the prostate with PSA of 17.6 and disease involving the left pelvic, left retroperitoneal and common iliac lymph nodes.    Indication for treatment:  Curative, Definitive Radiotherapy concurrent with LT-ADT      Radiation treatment dates:   11/02/21 - 12/28/21  Site/dose:  1. The prostate, seminal vesicles, and pelvic lymph nodes were initially treated to 45 Gy in 25 fractions of 1.8 Gy  2. The prostate and PET-positive nodes were boosted to 75 Gy with 15 additional fractions of 2.0 Gy   Beams/energy:  1. The prostate, seminal vesicles, and pelvic lymph nodes were initially treated using VMAT intensity modulated radiotherapy delivering 6 megavolt photons. Image guidance was performed with CB-CT studies prior to each fraction. He was immobilized with a body fix lower extremity mold.  2. the prostate and PET-positive nodes were boosted using VMAT intensity modulated radiotherapy delivering 6 megavolt photons. Image guidance was performed with CB-CT studies prior to each fraction. He was immobilized with a body fix lower extremity mold.  Narrative: The patient tolerated radiation treatment relatively well with only minor urinary irritation and modest fatigue.  He did report dysuria, nocturia, frequency, urgency and weak flow of stream which were all improved on starting Flomax daily.  He denied any abdominal pain or bowel issues.  Plan: The patient has completed radiation treatment. He will return to radiation oncology clinic for routine followup in one month. I advised him to call or return sooner if he has any questions or concerns related to his recovery or treatment. ________________________________  Sheral Apley. Tammi Klippel, M.D.

## 2022-01-27 NOTE — Progress Notes (Signed)
Telephone appointment. I verified patient's identity and began nursing interview. Patient reports penile head tenderness 2/10 w/ very mild, occasional dysuria 1/10. No other issues reported at this time.  Meaningful use complete. I-PSS score of 3 mild. Flomax as directed. Urology appt-Aug, 2023.  Reminded patient of his 2:00pm-01/28/22 telephone appointment w/ Ashlyn Bruning PA-C. I left my extension [phone] in case patient needs anything. Patient verbalized understanding.  Patient contact [phone]

## 2022-01-27 NOTE — Progress Notes (Signed)
Radiation Oncology         [phone]) [phone] ________________________________  Name: Matthew Marus Sr. MRN: [id number]  Date: 01/28/2022  DOB: 07/15/1955  Post Treatment Note  CC: Ladell Pier, MD  Billey Co, MD  Diagnosis:   66 year old male with oligometastatic, Gleason 5+5 adenocarcinoma of the prostate with PSA of 17.6 and disease involving the left pelvic, left retroperitoneal and common iliac lymph nodes.    Interval Since Last Radiation:  4.5 weeks  11/02/21 - 12/28/21: 1. The prostate, seminal vesicles, and pelvic lymph nodes were initially treated to 45 Gy in 25 fractions of 1.8 Gy  2. The prostate and PET-positive nodes were boosted to 75 Gy with 15 additional fractions of 2.0 Gy   Narrative:  I spoke with the patient to conduct his routine scheduled 1 month follow up visit via telephone to spare the patient unnecessary potential exposure in the healthcare setting during the current COVID-19 pandemic.  The patient was notified in advance and gave permission to proceed with this visit format.  He tolerated radiation treatment relatively well with only minor urinary irritation and modest fatigue.  He did report dysuria, nocturia, frequency, urgency and weak flow of stream which were all improved on starting Flomax daily.  He denied any abdominal pain or bowel issues.                                On review of systems, the patient states that he is doing well in general. His LUTS are gradually improving and the dysuria has completely resolved. He denies gross hematuria, straining to void, incomplete emptying or incontinence. He is taking Flomax daily as prescribed. He reports a healthy appetite and is maintaining his weight. He denies abdominal pain, N/V/D or constipation. He continues with mild fatigue but overall, feels he is tolerating the ADT well and is pleased with his progress to date.  ALLERGIES:  has No Known Allergies.  Meds: Current Outpatient Medications   Medication Sig Dispense Refill   atorvastatin (LIPITOR) 80 MG tablet Take 1 tablet (80 mg total) by mouth daily. 90 tablet 3   Continuous Blood Gluc Sensor (FREESTYLE LIBRE SENSOR SYSTEM) MISC Change sensor Q 2 wks 2 each 12   diazepam (VALIUM) 10 MG tablet Take 1 tablet (10 mg total) by mouth once as needed for up to 1 dose for anxiety (take 30 minutes prior to prostate biopsy). (Patient not taking: Reported on 09/30/2021) 1 tablet 0   fluticasone (FLONASE) 50 MCG/ACT nasal spray Place 1 spray into both nostrils daily. 16 g 2   linaclotide (LINZESS) 145 MCG CAPS capsule Take 1 capsule (145 mcg total) by mouth daily before breakfast. 30 capsule 1   loratadine (CLARITIN) 10 MG tablet Take 1 tablet (10 mg total) by mouth daily. 30 tablet 1   meloxicam (MOBIC) 7.5 MG tablet Take 1 tablet (7.5 mg total) by mouth daily. 30 tablet 1   metFORMIN (GLUCOPHAGE-XR) 500 MG 24 hr tablet Take 1 tablet (500 mg total) by mouth daily with breakfast. 60 tablet 0   olmesartan (BENICAR) 20 MG tablet Take 1 tablet (20 mg total) by mouth daily. 30 tablet 0   pantoprazole (PROTONIX) 20 MG tablet Take 1 tablet by mouth once daily 90 tablet 0   pregabalin (LYRICA) 75 MG capsule Take 1 capsule (75 mg total) by mouth 2 (two) times daily. 30 capsule 0   senna (SENOKOT) 8.6 MG tablet  Take 1 tablet (8.6 mg total) by mouth daily as needed for constipation. 30 tablet 3   sildenafil (VIAGRA) 50 MG tablet Take 1-2 tablets (50-100 mg total) by mouth daily as needed for erectile dysfunction. 15 tablet 11   tamsulosin (FLOMAX) 0.4 MG CAPS capsule Take 1 capsule (0.4 mg total) by mouth 2 (two) times daily after a meal. 60 capsule 5   traMADol (ULTRAM) 50 MG tablet TAKE 1 TABLET(50 MG) BY MOUTH EVERY 8 HOURS AS NEEDED 80 tablet 1   No current facility-administered medications for this encounter.    Physical Findings:  vitals were not taken for this visit.  Pain Assessment Pain Score: 2  (Penile tenderness (head of  penis))/10 Unable to assess due to telephone follow-up visit format.  Lab Findings: Lab Results  Component Value Date   WBC 10.9 (H) 05/06/2018   HGB 11.5 (L) 05/06/2018   HCT 35.2 (L) 05/06/2018   MCV 86.1 05/06/2018   PLT 348 05/06/2018     Radiographic Findings: No results found.  Impression/Plan: 37. 66 year old male with oligometastatic, Gleason 5+5 adenocarcinoma of the prostate with PSA of 17.6 and disease involving the left pelvic, left retroperitoneal and common iliac lymph nodes.   He will continue to follow up with urology for ongoing PSA determinations and has an appointment scheduled with Dr. Diamantina Providence on 02/25/2022. He understands what to expect with regards to PSA monitoring going forward.  He will also follow-up with Dr. Alen Blew on 03/10/2022 for further discussion regarding therapy escalation.  I will look forward to following his response to treatment via correspondence with urology, and would be happy to continue to participate in his care if clinically indicated. I talked to the patient about what to expect in the future, including his risk for erectile dysfunction and rectal bleeding. I encouraged him to call or return to the office if he has any questions regarding his previous radiation or possible radiation side effects. He was comfortable with this plan and will follow up as needed.     Nicholos Johns, PA-C

## 2022-01-28 ENCOUNTER — Ambulatory Visit
Admission: RE | Admit: 2022-01-28 | Discharge: 2022-01-28 | Disposition: A | Discharge status: Home / Self Care | Payer: 59 | Source: Ambulatory Visit | Attending: Radiation Oncology | Admitting: Radiation Oncology

## 2022-01-28 DIAGNOSIS — C61 Malignant neoplasm of prostate: Secondary | ICD-10-CM | POA: Insufficient documentation

## 2022-01-29 ENCOUNTER — Ambulatory Visit: Payer: 59 | Admitting: Podiatry

## 2022-01-29 LAB — DRUG SCREEN 764883 11+OXYCO+ALC+CRT-BUND
Amphetamines, Urine: NEGATIVE ng/mL
BENZODIAZ UR QL: NEGATIVE ng/mL
Barbiturate: NEGATIVE ng/mL
Cannabinoid Quant, Ur: NEGATIVE ng/mL
Creatinine: 61.9 mg/dL (ref 20.0–300.0)
Ethanol: NEGATIVE %
Meperidine: NEGATIVE ng/mL
Methadone Screen, Urine: NEGATIVE ng/mL
OPIATE SCREEN URINE: NEGATIVE ng/mL
Oxycodone/Oxymorphone, Urine: NEGATIVE ng/mL
Phencyclidine: NEGATIVE ng/mL
Propoxyphene: NEGATIVE ng/mL
Tramadol: NEGATIVE ng/mL
pH, Urine: 5.4 (ref 4.5–8.9)

## 2022-01-29 LAB — COCAINE CONF, UR
Benzoylecgonine GC/MS Conf: >5000 ng/mL
Cocaine Metab Quant, Ur: POSITIVE — AB

## 2022-01-31 ENCOUNTER — Other Ambulatory Visit: Payer: Self-pay | Admitting: Internal Medicine

## 2022-01-31 DIAGNOSIS — M170 Bilateral primary osteoarthritis of knee: Secondary | ICD-10-CM

## 2022-02-01 ENCOUNTER — Telehealth: Payer: Self-pay | Admitting: Internal Medicine

## 2022-02-01 DIAGNOSIS — M170 Bilateral primary osteoarthritis of knee: Secondary | ICD-10-CM

## 2022-02-01 NOTE — Telephone Encounter (Signed)
Phone call placed to patient today x2 to discuss urine drug screen results.  His voicemail box was full and I was unable to leave a message.  We will send him a MyChart message.

## 2022-02-02 NOTE — Telephone Encounter (Addendum)
Patient returning PCP call. Patient states he works nights therefore please call back between 11 am or before 2:30 pm prior to him going to work. Patient states he works from 3 pm-4 am.

## 2022-02-03 NOTE — Telephone Encounter (Signed)
Phone call placed to patient today to discuss the results of his urine drug screen.  Advised that the urine drug screen was positive for cocaine and no tramadol was in the urine.  Patient tells me that he had run out of the tramadol and was hurting so her friend offered him some cocaine which he used once to help decrease his pain.  He denies any ongoing use of cocaine stating that it was a one-time deal.  I reminded patient that per our controlled substance prescribing agreement, he should not be using any street drugs while on narcotic medication.  Also due to the fact that tramadol was not in the urine is concerning.  Advised that I will need to refer him to pain specialist for further management of his pain.  Encouraged him to stay away from street drugs to get help if the cocaine use is an ongoing thing which he denies.

## 2022-02-03 NOTE — Addendum Note (Signed)
Addended by: Karle Plumber B on: 02/03/2022 11:56 AM   Modules accepted: Orders

## 2022-02-08 NOTE — Progress Notes (Signed)
RN spoke to patient to assess any additional navigation needs after completing radiation treatment. Pt declines any needs at this time. Reports doing well.    RN spoke with patient regarding interest in genetics due to his metastatic prostate cancer.  Pt declines at this time.  Pt aware of upcoming MD follow up on 9/6.

## 2022-02-10 ENCOUNTER — Ambulatory Visit: Payer: Self-pay

## 2022-02-10 ENCOUNTER — Ambulatory Visit (INDEPENDENT_AMBULATORY_CARE_PROVIDER_SITE_OTHER): Payer: 59

## 2022-02-10 ENCOUNTER — Ambulatory Visit: Payer: 59 | Admitting: Orthopedic Surgery

## 2022-02-10 DIAGNOSIS — M25512 Pain in left shoulder: Secondary | ICD-10-CM

## 2022-02-10 DIAGNOSIS — M19012 Primary osteoarthritis, left shoulder: Secondary | ICD-10-CM | POA: Diagnosis not present

## 2022-02-10 DIAGNOSIS — M170 Bilateral primary osteoarthritis of knee: Secondary | ICD-10-CM

## 2022-02-14 ENCOUNTER — Other Ambulatory Visit: Payer: Self-pay | Admitting: Internal Medicine

## 2022-02-14 ENCOUNTER — Encounter: Payer: Self-pay | Admitting: Orthopedic Surgery

## 2022-02-14 DIAGNOSIS — E1159 Type 2 diabetes mellitus with other circulatory complications: Secondary | ICD-10-CM

## 2022-02-14 NOTE — Progress Notes (Signed)
Office Visit Note   Patient: Matthew Loera Sr.           Date of Birth: [birth year]           MRN: [id number] Visit Date: 02/10/2022 Requested by: Ladell Pier, MD [address],  [place] [address] PCP: Ladell Pier, MD  Subjective: Chief Complaint  Patient presents with   Left Shoulder - Pain    HPI: Matthew Kirn Sr. is a 66 y.o. male who presents to the office complaining of left shoulder pain.  Patient complains of anterolateral shoulder pain with radiation down to the elbow and forearm.  Complains of constant pain that is worsening.  He has history of glenohumeral arthritis which was previously discussed with Dr. Marlou Sa in 2018 with consideration of surgery but he never followed up.  He has had prior injections into the shoulder with 3 to 4 months of relief but has not had any recent injections in several years.  He is right-hand dominant.  Pain wakes him up every night.  No history of prior surgery to the shoulder.  He works driving a Forensic scientist primarily.  Does have history of diabetes with last A1c 5.6.  He is an occasional smoker but only smokes 1 to 2 cigarettes every now and then.  Denies any significant neck pain but does note occasional numbness or tingling in his ring or pinky finger.  No scapular pain.  In his free time he does try and go to the gym where he mostly uses the treadmill and does not do a lot of heavy weight lifting.  Takes meloxicam and tramadol for pain control..                ROS: All systems reviewed are negative as they relate to the chief complaint within the history of present illness.  Patient denies fevers or chills.  Assessment & Plan: Visit Diagnoses:  1. Left shoulder pain, unspecified chronicity   2. Osteoarthritis of knees (Bilateral)     Plan: Patient is a 66 year old male who presents for evaluation of left shoulder pain.  Has history of left shoulder osteoarthritis.  Significant stiffness on exam today.  Left shoulder  radiographs demonstrate end-stage arthritis of the left glenohumeral joint.  Has had prior injections with good relief for several months but none recently.  He is considering surgery and this was discussed in depth with him as far as the process of shoulder replacement as well as the risks and benefits of the procedure including but not limited to the risk of nerve/blood vessel damage, shoulder stiffness, shoulder instability, intraoperative fracture, prosthetic joint infection, medical complication from surgery, need for revision surgery.  He would like to proceed with CT scan for preoperative planning and then further discuss surgery at appointment following CT scan.  He understands that any cortisone injection would delay surgery by 3 months to limit the risk of prosthetic joint infection.  Despite this, he would like to proceed with left shoulder glenohumeral injection today.  This was administered under ultrasound guidance and patient tolerated the procedure well.  Follow-up after left shoulder thin cut CT scan.  Additionally, patient requested prescription for tramadol today.  With chart review showing recent urine drug screen positive for cocaine, declined filling this prescription.  He has been receiving tramadol from his PCP Dr. Wynetta Emery, but she has recently referred him to pain management in light of his UDS.  Tox screen may be required prior to surgery.  Follow-Up Instructions: No follow-ups on file.   Orders:  Orders Placed This Encounter  Procedures   XR Shoulder Left   US Guided Needle Placement - No Linked Charges   CT SHOULDER LEFT WO CONTRAST   No orders of the defined types were placed in this encounter.     Procedures: No procedures performed   Clinical Data: No additional findings.  Objective: Vital Signs: There were no vitals taken for this visit.  Physical Exam:  Constitutional: Patient appears well-developed HEENT:  Head: Normocephalic Eyes:EOM are  normal Neck: Normal range of motion Cardiovascular: Normal rate Pulmonary/chest: Effort normal Neurologic: Patient is alert Skin: Skin is warm Psychiatric: Patient has normal mood and affect  Ortho Exam: Ortho exam demonstrates left shoulder with 10 degrees X rotation, 50 degrees abduction, 70 degrees forward flexion.  This compared with right shoulder with 45 degrees X rotation, 120 degrees abduction, 170 degrees forward flexion.  Excellent rotator cuff strength of supra, infra, subscap.  Axillary nerve intact with deltoid firing bilaterally.  5/5 motor strength of bilateral grip strength, finger abduction, pronation/supination, bicep, tricep, deltoid.  Moderate tenderness over the bicipital groove.  No significant tenderness over the [facility] joint.  No tenderness over the axial cervical spine.  Negative Spurling sign.  Negative Lhermitte sign.  Specialty Comments:  No specialty comments available.  Imaging: No results found.   PMFS History: Patient Active Problem List   Diagnosis Date Noted   Drug-induced constipation 07/21/2021   Malignant neoplasm of prostate ([place]) 07/21/2021   Hyperlipidemia associated with type 2 diabetes mellitus ([place]) 11/13/2020   Morbid obesity ([place]) 11/13/2020   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   DDD (degenerative disc disease), cervical 09/21/2017   DJD of acromioclavicular joint (Right) 09/21/2017   DJD of acromioclavicular joint (Bilateral) 09/21/2017   DJD of acromioclavicular joint (Left) 09/21/2017   Osteoarthritis of glenohumeral joint (Left) 09/21/2017   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/2017   Tricompartmental disease of knee (Bilateral) 09/21/2017   Osteoarthritis of knees (Bilateral) 09/21/2017   Suprapatellar effusion of knee (recurrent) (Right) 09/21/2017   Calcaneal spur of feet (Bilateral) 09/21/2017   Hypomagnesemia 09/20/2017   Low testosterone 09/20/2017   Chronic foot pain (Bilateral) 09/20/2017   Vitamin D deficiency  04/04/2017   Chronic shoulder pain (Primary Area of Pain) (Left) 03/30/2017   Chronic knee pain (Tertiary Area of Pain) (Left) 03/30/2017   Disorder of bone, unspecified 03/30/2017   Other reduced mobility 03/30/2017   Other specified health status 03/30/2017   Opioid use agreement exists 03/30/2017   Opiate use 03/30/2017   Chronic pain syndrome 03/30/2017   Chronic upper extremity pain (Secondary Area of Pain) (Bilateral) 03/30/2017   Elevated PSA 12/05/2015   Noncompliance 12/05/2015   Hypertension associated with diabetes ([place]) 03/14/2015   Type 2 diabetes mellitus with diabetic mononeuropathy ([place]) 03/14/2015   Encounter for health maintenance examination in adult 03/14/2015   Screening for prostate cancer 03/14/2015   Special screening for malignant neoplasms, colon 03/14/2015   Hyperlipidemia 03/14/2015   Nocturia 03/14/2015   Urinary frequency 03/14/2015   Need for prophylactic vaccination and inoculation against influenza 03/14/2015   Need for prophylactic vaccination against Streptococcus pneumoniae (pneumococcus) 03/14/2015   Need for Tdap vaccination 03/14/2015   Chronic nausea 03/14/2015   Gastroesophageal reflux disease without esophagitis 03/14/2015   Past Medical History:  Diagnosis Date   Allergy    Arthritis    DM (diabetes mellitus) ([place])    Foot pain, bilateral    referred to podiatry 06/2014  GERD (gastroesophageal reflux disease)    long history of   Hyperlipidemia    Hypertension    Polyarthralgia    shoulders, knees, ankles, wrists; neg rheum lab screen 04/2014   Prostate cancer ([place])    Type 2 diabetes mellitus with diabetic neuropathy ([place])    Wears glasses     Family History  Problem Relation Age of Onset   Diabetes Mother    Hypertension Mother    Other Father        murdered   Diabetes Sister    Arthritis Sister    Heart disease Sister    Diabetes Sister    Diabetes Sister    Cancer Neg Hx    Stroke Neg Hx    Prostate cancer Neg Hx         unknown   Bladder Cancer Neg Hx        unkown   Kidney cancer Neg Hx        unkown    Past Surgical History:  Procedure Laterality Date   COLONOSCOPY     referral pending 03/2015   NO PAST SURGERIES     as of 03/2015   Social History   Occupational History   Occupation: switcher/ spotter/forklift  Tobacco Use   Smoking status: Some Days    Types: Cigarettes   Smokeless tobacco: Never  Vaping Use   Vaping Use: Never used  Substance and Sexual Activity   Alcohol use: Yes    Comment: occasional beer   Drug use: No   Sexual activity: Yes

## 2022-02-15 MED ORDER — OLMESARTAN MEDOXOMIL 20 MG PO TABS: 20.0000 mg | ORAL_TABLET | Freq: Every day | ORAL | 0 refills | Status: DC

## 2022-02-16 ENCOUNTER — Other Ambulatory Visit: Payer: 59

## 2022-02-16 DIAGNOSIS — C61 Malignant neoplasm of prostate: Secondary | ICD-10-CM

## 2022-02-17 LAB — PSA: Prostate Specific Ag, Serum: 0.3 ng/mL (ref 0.0–4.0)

## 2022-02-21 ENCOUNTER — Other Ambulatory Visit: Payer: Self-pay | Admitting: Physician Assistant

## 2022-02-25 ENCOUNTER — Ambulatory Visit (INDEPENDENT_AMBULATORY_CARE_PROVIDER_SITE_OTHER): Payer: Medicare Other | Admitting: Urology

## 2022-02-25 ENCOUNTER — Encounter: Payer: Self-pay | Admitting: Urology

## 2022-02-25 ENCOUNTER — Other Ambulatory Visit: Payer: Self-pay | Admitting: Oncology

## 2022-02-25 VITALS — BP 130/82 | HR 85 | Ht 71.0 in | Wt 297.0 lb

## 2022-02-25 DIAGNOSIS — C61 Malignant neoplasm of prostate: Secondary | ICD-10-CM | POA: Diagnosis not present

## 2022-02-25 DIAGNOSIS — N529 Male erectile dysfunction, unspecified: Secondary | ICD-10-CM | POA: Diagnosis not present

## 2022-02-25 DIAGNOSIS — R399 Unspecified symptoms and signs involving the genitourinary system: Secondary | ICD-10-CM | POA: Diagnosis not present

## 2022-02-25 MED ORDER — SILDENAFIL CITRATE 100 MG PO TABS: 100.0000 mg | ORAL_TABLET | Freq: Every day | ORAL | 11 refills | Status: DC | PRN

## 2022-02-25 MED ORDER — LEUPROLIDE ACETATE (6 MONTH) 45 MG ~~LOC~~ KIT
45.0000 mg | PACK | Freq: Once | SUBCUTANEOUS | Status: AC
  Administered 2022-02-25: 45 mg via SUBCUTANEOUS

## 2022-02-25 NOTE — Progress Notes (Signed)
   02/25/2022 11:07 AM   Matthew Colon Sr. [birth year] [id number]  Reason for visit: Follow up prostate cancer, lower urinary tract symptoms  HPI: 66 year old male with elevated PSA of 17.6, and prostate MRI in September showed diffuse abnormal signal worrisome for possible diffuse prostate neoplasm.  He lives in [place] and has significant problems with transportation, and unfortunately the urology group in [place] does not take his insurance so he has been coming to [place] at [facility] for visits.  He no showed multiple prostate biopsy appointments, however after extensive conversations over the phone he ultimately underwent a prostate biopsy on 07/03/2021.  Prostate biopsy showed a 30 g prostate with extensive high-grade prostate cancer throughout.  All 12 cores were positive for prostate acinar adenocarcinoma, primarily grade group 5 disease, and max core involvement of 100%.  Prostate MRI showed possible extraprostatic extension beyond the capsule at the prostate base.  PSMA PET scan in February 2023 also sick dusted metastatic disease in the left pelvic lymph nodes and left lower retroperitoneal lymph nodes.  He ultimately opted for treatment with external beam radiation and 2 years of ADT, and radiation to the prostate and nodes was completed in May 2023 with Dr. Tammi Klippel in [place].  He is also scheduled for follow-up with oncology Dr. Alen Blew on 03/10/2022 for consideration of additional treatments.  Initial post radiation PSA on 02/16/2022 was 0.3.  He has been on Flomax for some irritative urinary symptoms after undergoing radiation.  He denies any significant urinary complaints today on the Flomax and would like to continue that medication.  He also reports some persistent ED, and is interested in resuming the sildenafil 50 to 100 mg on demand.  We reviewed his diagnosis of oligometastatic prostate cancer with high-grade disease.  We discussed risk of recurrence  as well as likely need for adjuvant treatments in the future at length.  -43-monthADT injection given today, anticipate 2 to 3 years total from initial dose February 2023 -Will message Dr. SAlen Blewto see if they are willing to take over ADT locally, [facility] the [facility] -Continue PSA monitoring every 4 to 6 months with high risk disease -Continue Flomax, Viagra refilled  BBilley Co MD  [address] [name] [phone]

## 2022-02-25 NOTE — Progress Notes (Signed)
Eligard SubQ Injection   Due to Prostate Cancer patient is present today for a Eligard Injection.  Medication: Eligard 6 month Dose: 45 mg  Location: right arm Lot: [id number] Exp: 07/2023  Patient tolerated well, no complications were noted.  Performed by: Gordy Clement, [place]   Per Dr. Diamantina Providence patient is to continue therapy for 2 years. Patient desire to have ADT injections scheduled in [place] due to transportation difficulties. Dr. Diamantina Providence to message oncologist in [place] to facilitate. Patient given a reminder to take Vitamin D 800-1000iu and Calcium 1000-'1200mg'$  daily while on Androgen Deprivation Therapy.  PA approval dates: Medicare Primary, No PA required.

## 2022-02-25 NOTE — Progress Notes (Signed)
He will continue Eligard every 6 months at the cancer center for convenience.  Last injection was given on February 25, 2022 will be repeated in 6 months.

## 2022-02-26 ENCOUNTER — Ambulatory Visit (HOSPITAL_COMMUNITY): Admission: RE | Admit: 2022-02-26 | Payer: 59 | Source: Ambulatory Visit

## 2022-03-03 ENCOUNTER — Ambulatory Visit (HOSPITAL_COMMUNITY): Admission: RE | Admit: 2022-03-03 | Payer: 59 | Source: Ambulatory Visit

## 2022-03-05 ENCOUNTER — Encounter: Payer: Self-pay | Admitting: Oncology

## 2022-03-10 ENCOUNTER — Inpatient Hospital Stay: Payer: Medicare Other

## 2022-03-10 ENCOUNTER — Ambulatory Visit (HOSPITAL_COMMUNITY): Payer: Medicare Other

## 2022-03-10 ENCOUNTER — Inpatient Hospital Stay: Payer: Medicare Other | Admitting: Oncology

## 2022-03-10 ENCOUNTER — Telehealth: Payer: Self-pay | Admitting: Oncology

## 2022-03-10 NOTE — Telephone Encounter (Signed)
Per 9/6 phone line pt called to r/s appointment  appointment r/s per pt request

## 2022-03-11 ENCOUNTER — Ambulatory Visit: Payer: 59 | Admitting: Surgical

## 2022-03-16 ENCOUNTER — Other Ambulatory Visit: Payer: Self-pay | Admitting: Critical Care Medicine

## 2022-03-16 DIAGNOSIS — I152 Hypertension secondary to endocrine disorders: Secondary | ICD-10-CM

## 2022-03-18 ENCOUNTER — Ambulatory Visit (HOSPITAL_COMMUNITY)
Admission: RE | Admit: 2022-03-18 | Discharge: 2022-03-18 | Disposition: A | Discharge status: Home / Self Care | Payer: Medicare Other | Source: Ambulatory Visit | Attending: Orthopedic Surgery | Admitting: Orthopedic Surgery

## 2022-03-18 DIAGNOSIS — M25512 Pain in left shoulder: Secondary | ICD-10-CM | POA: Diagnosis present

## 2022-03-18 DIAGNOSIS — M19012 Primary osteoarthritis, left shoulder: Secondary | ICD-10-CM | POA: Diagnosis present

## 2022-03-19 ENCOUNTER — Ambulatory Visit: Payer: 59 | Admitting: Surgical

## 2022-03-23 ENCOUNTER — Telehealth: Payer: Self-pay | Admitting: *Deleted

## 2022-03-23 ENCOUNTER — Encounter: Payer: Self-pay | Admitting: Oncology

## 2022-03-23 ENCOUNTER — Encounter: Payer: Self-pay | Admitting: Podiatry

## 2022-03-23 NOTE — Telephone Encounter (Signed)
Patient is requesting a note back to work by tomorrow that will giving him permission to go back with limited restrictions , not being able to stand on feet for 8 hours due to his nerve pain

## 2022-03-26 ENCOUNTER — Encounter: Payer: Self-pay | Admitting: *Deleted

## 2022-03-26 NOTE — Telephone Encounter (Signed)
Letter approved and patient has received.

## 2022-03-29 ENCOUNTER — Other Ambulatory Visit: Payer: Self-pay | Admitting: Internal Medicine

## 2022-03-29 DIAGNOSIS — E1142 Type 2 diabetes mellitus with diabetic polyneuropathy: Secondary | ICD-10-CM

## 2022-03-29 NOTE — Telephone Encounter (Signed)
Medication Refill - Medication: metFORMIN (GLUCOPHAGE-XR) 500 MG 24 hr tablet  Pt is completely out and has been waiting on refill / pt wants this refilled today   Has the patient contacted their pharmacy? Yes.   (Agent: If no, request that the patient contact the pharmacy for the refill. If patient does not wish to contact the pharmacy document the reason why and proceed with request.) (Agent: If yes, when and what did the pharmacy advise?) no response form request sent tot pcp  Preferred Pharmacy (with phone number or street name): [facility] [place] Has the patient been seen for an appointment in the last year OR does the patient have an upcoming appointment? Yes.    Agent: Please be advised that RX refills may take up to 3 business days. We ask that you follow-up with your pharmacy.

## 2022-03-30 ENCOUNTER — Telehealth: Payer: Self-pay | Admitting: *Deleted

## 2022-03-30 ENCOUNTER — Inpatient Hospital Stay: Payer: Medicare Other | Admitting: Oncology

## 2022-03-30 ENCOUNTER — Inpatient Hospital Stay: Payer: Medicare Other

## 2022-03-30 ENCOUNTER — Other Ambulatory Visit: Payer: Self-pay | Admitting: Pharmacist

## 2022-03-30 DIAGNOSIS — E1142 Type 2 diabetes mellitus with diabetic polyneuropathy: Secondary | ICD-10-CM

## 2022-03-30 MED ORDER — METFORMIN HCL ER 500 MG PO TB24: 500.0000 mg | ORAL_TABLET | Freq: Every day | ORAL | 0 refills | Status: DC

## 2022-03-30 NOTE — Telephone Encounter (Signed)
Requested medication (s) are due for refill today: yes  Requested medication (s) are on the active medication list: yes  Last refill:  01/13/22 #60 with 0 RF  Future visit scheduled: no, just seen 01/21/22  Notes to clinic:  Failed protocol of labs within 12 months, cbc 2019, ordered at visit but not drawn, no upcoming appt, please assess. Pt called and is completely out and very upset since he had a curtesy refill and came for his visit. But with the CBC out of date, I can not fill it, please assess.      Requested Prescriptions  Pending Prescriptions Disp Refills   metFORMIN (GLUCOPHAGE-XR) 500 MG 24 hr tablet 60 tablet 0    Sig: Take 1 tablet (500 mg total) by mouth daily with breakfast.     Endocrinology:  Diabetes - Biguanides Failed - 03/29/2022 10:20 AM      Failed - eGFR in normal range and within 360 days    GFR calc Af Amer  Date Value Ref Range Status  05/06/2018 >60 >60 mL/min Final    Comment:    (NOTE) The eGFR has been calculated using the CKD EPI equation. This calculation has not been validated in all clinical situations. eGFR's persistently <60 mL/min signify possible Chronic Kidney Disease.    GFR calc non Af Amer  Date Value Ref Range Status  05/06/2018 >60 >60 mL/min Final   eGFR  Date Value Ref Range Status  12/16/2020 69 >59 mL/min/1.73 Final         Failed - B12 Level in normal range and within 720 days    Vitamin B-12  Date Value Ref Range Status  03/30/2017 244 180 - 914 pg/mL Final    Comment:    (NOTE) This assay is not validated for testing neonatal or myeloproliferative syndrome specimens for Vitamin B12 levels. Performed at [facility], [place] [address] [place], [place] [address]          Failed - CBC within normal limits and completed in the last 12 months    WBC  Date Value Ref Range Status  05/06/2018 10.9 (H) 4.0 - 10.5 K/uL Final   RBC  Date Value Ref Range Status  05/06/2018 4.09 (L) 4.22 - 5.81 MIL/uL Final    Hemoglobin  Date Value Ref Range Status  05/06/2018 11.5 (L) 13.0 - 17.0 g/dL Final   HCT  Date Value Ref Range Status  05/06/2018 35.2 (L) 39.0 - 52.0 % Final   MCHC  Date Value Ref Range Status  05/06/2018 32.7 30.0 - 36.0 g/dL Final   [facility]  Date Value Ref Range Status  05/06/2018 28.1 26.0 - 34.0 pg Final   MCV  Date Value Ref Range Status  05/06/2018 86.1 80.0 - 100.0 fL Final   No results found for: "PLTCOUNTKUC", "LABPLAT", "POCPLA" RDW  Date Value Ref Range Status  05/06/2018 14.1 11.5 - 15.5 % Final         Passed - Cr in normal range and within 360 days    Creatinine  Date Value Ref Range Status  01/21/2022 61.9 20.0 - 300.0 mg/dL Final   Creat  Date Value Ref Range Status  12/05/2015 1.18 0.70 - 1.33 mg/dL Final   Creatinine, Ser  Date Value Ref Range Status  12/16/2020 1.19 0.76 - 1.27 mg/dL Final         Passed - HBA1C is between 0 and 7.9 and within 180 days    HbA1c, POC (controlled diabetic range)  Date Value Ref Range Status  01/21/2022 5.9 0.0 - 7.0 % Final         Passed - Valid encounter within last 6 months    Recent Outpatient Visits           2 months ago Type 2 diabetes mellitus with morbid obesity ([place])   [place] Karle Plumber B, MD   8 months ago Type 2 diabetes mellitus with peripheral neuropathy [facility])   [place], MD   11 months ago Need for influenza vaccination   [place], Stephen L, RPH-CPP   1 year ago Type 2 diabetes mellitus with peripheral neuropathy [facility])   [place], MD   1 year ago Hypertension associated with diabetes [facility])   [place], RPH-CPP       Future Appointments             In 3 days Marlou Sa, [place]onna [place], MD [facility]

## 2022-03-30 NOTE — Telephone Encounter (Signed)
PC to patient regarding missed appointments this a.m., he stated he was in a job interview & needs to reschedule, informed patient scheduling will contact him, he verbalizes understanding.  Scheduling message sent.

## 2022-04-01 ENCOUNTER — Inpatient Hospital Stay (HOSPITAL_BASED_OUTPATIENT_CLINIC_OR_DEPARTMENT_OTHER): Payer: Medicare Other | Admitting: Oncology

## 2022-04-01 ENCOUNTER — Inpatient Hospital Stay: Payer: Medicare Other | Attending: Oncology

## 2022-04-01 ENCOUNTER — Other Ambulatory Visit: Payer: Self-pay

## 2022-04-01 VITALS — BP 120/55 | HR 97 | Temp 97.6°F | Resp 19 | Ht 71.0 in | Wt 295.3 lb

## 2022-04-01 DIAGNOSIS — Z923 Personal history of irradiation: Secondary | ICD-10-CM | POA: Diagnosis not present

## 2022-04-01 DIAGNOSIS — C61 Malignant neoplasm of prostate: Secondary | ICD-10-CM

## 2022-04-01 DIAGNOSIS — C775 Secondary and unspecified malignant neoplasm of intrapelvic lymph nodes: Secondary | ICD-10-CM | POA: Insufficient documentation

## 2022-04-01 LAB — CBC WITH DIFFERENTIAL (CANCER CENTER ONLY)
Abs Immature Granulocytes: 0.04 10*3/uL (ref 0.00–0.07)
Basophils Absolute: 0 10*3/uL (ref 0.0–0.1)
Basophils Relative: 0 %
Eosinophils Absolute: 0.3 10*3/uL (ref 0.0–0.5)
Eosinophils Relative: 4 %
HCT: 31.3 % — ABNORMAL LOW (ref 39.0–52.0)
Hemoglobin: 10.4 g/dL — ABNORMAL LOW (ref 13.0–17.0)
Immature Granulocytes: 1 %
Lymphocytes Relative: 16 %
Lymphs Abs: 1.2 10*3/uL (ref 0.7–4.0)
MCH: 30.2 pg (ref 26.0–34.0)
MCHC: 33.2 g/dL (ref 30.0–36.0)
MCV: 91 fL (ref 80.0–100.0)
Monocytes Absolute: 0.5 10*3/uL (ref 0.1–1.0)
Monocytes Relative: 7 %
Neutro Abs: 5.3 10*3/uL (ref 1.7–7.7)
Neutrophils Relative %: 72 %
Platelet Count: 300 10*3/uL (ref 150–400)
RBC: 3.44 MIL/uL — ABNORMAL LOW (ref 4.22–5.81)
RDW: 14.5 % (ref 11.5–15.5)
WBC Count: 7.4 10*3/uL (ref 4.0–10.5)
nRBC: 0 % (ref 0.0–0.2)

## 2022-04-01 LAB — CMP (CANCER CENTER ONLY)
ALT: 8 U/L (ref 0–44)
AST: 15 U/L (ref 15–41)
Albumin: 3.8 g/dL (ref 3.5–5.0)
Alkaline Phosphatase: 73 U/L (ref 38–126)
Anion gap: 9 (ref 5–15)
BUN: 24 mg/dL — ABNORMAL HIGH (ref 8–23)
CO2: 26 mmol/L (ref 22–32)
Calcium: 9.4 mg/dL (ref 8.9–10.3)
Chloride: 106 mmol/L (ref 98–111)
Creatinine: 1.7 mg/dL — ABNORMAL HIGH (ref 0.61–1.24)
GFR, Estimated: 44 mL/min — ABNORMAL LOW (ref >60)
Glucose, Bld: 110 mg/dL — ABNORMAL HIGH (ref 70–99)
Potassium: 4.6 mmol/L (ref 3.5–5.1)
Sodium: 141 mmol/L (ref 135–145)
Total Bilirubin: 0.4 mg/dL (ref 0.3–1.2)
Total Protein: 6.9 g/dL (ref 6.5–8.1)

## 2022-04-01 NOTE — Progress Notes (Addendum)
Hematology and Oncology Follow Up Visit  Matthew Broughton Sr. [id number] 09/08/1955 66 y.o. 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  He was found to have Gleason score of 10 and PSA of 17.6.   Prior Therapy:   Radiation therapy to the prostate, seminal vesicle and pelvic lymph nodes completed in June 2023.  He completed 75 Gray total treatment.  Current therapy:  Androgen deprivation therapy in the form of Eligard started in February 2023.  Last injection was given in August 2023 for a total of 45 mg.  This will be repeated in 6 months.  Interim History: Matthew Colon returns today for follow-up.  Since last visit, he completed radiation therapy and currently on androgen deprivation.  He tolerated this treatment well with few complaints.  He had reported hot flashes and some sexual dysfunction although he still able to maintain erections with or sometimes without Viagra.  He denies any hematuria, dysuria or any other complaints.     Medications: I have reviewed the patient's current medications.  Current Outpatient Medications  Medication Sig Dispense Refill   atorvastatin (LIPITOR) 80 MG tablet Take 1 tablet (80 mg total) by mouth daily. 90 tablet 3   Continuous Blood Gluc Sensor (FREESTYLE LIBRE SENSOR SYSTEM) MISC Change sensor Q 2 wks 2 each 12   fluticasone (FLONASE) 50 MCG/ACT nasal spray Place 1 spray into both nostrils daily. 16 g 2   linaclotide (LINZESS) 145 MCG CAPS capsule Take 1 capsule (145 mcg total) by mouth daily before breakfast. 30 capsule 1   loratadine (CLARITIN) 10 MG tablet Take 1 tablet (10 mg total) by mouth daily. 30 tablet 1   meloxicam (MOBIC) 7.5 MG tablet TAKE 1 TABLET(7.5 MG) BY MOUTH DAILY 30 tablet 1   metFORMIN (GLUCOPHAGE-XR) 500 MG 24 hr tablet Take 1 tablet (500 mg total) by mouth daily with  breakfast. 60 tablet 0   olmesartan (BENICAR) 20 MG tablet Take 1 tablet by mouth once daily 30 tablet 0   pantoprazole (PROTONIX) 20 MG tablet Take 1 tablet by mouth once daily 90 tablet 0   pregabalin (LYRICA) 75 MG capsule Take 1 capsule (75 mg total) by mouth 2 (two) times daily. 30 capsule 0   senna (SENOKOT) 8.6 MG tablet Take 1 tablet (8.6 mg total) by mouth daily as needed for constipation. 30 tablet 3   sildenafil (VIAGRA) 100 MG tablet Take 1 tablet (100 mg total) by mouth daily as needed for erectile dysfunction. 30 tablet 11   tamsulosin (FLOMAX) 0.4 MG CAPS capsule Take 1 capsule (0.4 mg total) by mouth 2 (two) times daily after a meal. 60 capsule 5   traMADol (ULTRAM) 50 MG tablet TAKE 1 TABLET(50 MG) BY MOUTH EVERY 8 HOURS AS NEEDED 80 tablet 1   No current facility-administered medications for this visit.     Allergies: No Known Allergies    Physical Exam: Blood pressure (!) 120/55, pulse 97, temperature 97.6 F (36.4 C), temperature source Tympanic, resp. rate 19, height '5\' 11"'$  (1.803 m), weight 295 lb 4.8 oz (133.9 kg), SpO2 97 %.  ECOG: 0   General appearance: Comfortable appearing without any discomfort Head: Normocephalic without any trauma Oropharynx: Mucous membranes are moist and pink without any thrush or ulcers. Eyes: Pupils are equal and round reactive to light. Lymph nodes: No cervical, supraclavicular, inguinal or axillary lymphadenopathy.   Heart:regular rate  and rhythm.  S1 and S2 without leg edema. Lung: Clear without any rhonchi or wheezes.  No dullness to percussion. Abdomin: Soft, nontender, nondistended with good bowel sounds.  No hepatosplenomegaly. Musculoskeletal: No joint deformity or effusion.  Full range of motion noted. Neurological: No deficits noted on motor, sensory and deep tendon reflex exam. Skin: No petechial rash or dryness.  Appeared moist.      Lab Results: Lab Results  Component Value Date   WBC 10.9 (H) 05/06/2018   HGB  11.5 (L) 05/06/2018   HCT 35.2 (L) 05/06/2018   MCV 86.1 05/06/2018   PLT 348 05/06/2018   PSA 9.57 (H) 12/05/2015     Chemistry      Component Value Date/Time   NA 141 12/16/2020 0913   K 4.4 12/16/2020 0913   CL 103 12/16/2020 0913   CO2 21 12/16/2020 0913   BUN 11 12/16/2020 0913   CREATININE 1.19 12/16/2020 0913   CREATININE 1.18 12/05/2015 0841      Component Value Date/Time   CALCIUM 9.8 12/16/2020 0913   ALKPHOS 93 12/16/2020 0913   AST 17 12/16/2020 0913   ALT 7 12/16/2020 0913   BILITOT 0.4 12/16/2020 0913          Impression and Plan:   66 year old with:  1.  Castration-sensitive advanced prostate cancer with limited pelvic adenopathy diagnosed in December 2022.    Given his high risk disease, I have recommended therapy escalation with abiraterone and prednisone or enzalutamide.  Complications associated with this treatment were discussed.  These include hypertension, fatigue, and others.  After discussion today, he opted against it and will continue with androgen deprivation therapy alone.  These options will be deferred unless he has recurrent disease in the future.   2.  Androgen deprivation therapy: He will continue to receive Eligard every 6 months.  Next injection will be in February 2024.  The duration of therapy will be 2 years to be completed around February 2025.   3.  Follow-up: In February 2024 for repeat evaluation and Eligard.     30  minutes were spent on this encounter.  The time was dedicated to reviewing disease status, treatment choices and outlining future plan of care review.   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  No further intervention or follow-up is needed.  We will be happy to see her in the future if she has drop in her platelet counts.  She has frequent labs done by her neurologist.  Matthew Colon

## 2022-04-02 ENCOUNTER — Ambulatory Visit (INDEPENDENT_AMBULATORY_CARE_PROVIDER_SITE_OTHER): Payer: Medicare Other | Admitting: Surgical

## 2022-04-02 DIAGNOSIS — M19012 Primary osteoarthritis, left shoulder: Secondary | ICD-10-CM

## 2022-04-03 LAB — PROSTATE-SPECIFIC AG, SERUM (LABCORP): Prostate Specific Ag, Serum: 0.2 ng/mL (ref 0.0–4.0)

## 2022-04-05 ENCOUNTER — Telehealth: Payer: Self-pay | Admitting: *Deleted

## 2022-04-05 ENCOUNTER — Other Ambulatory Visit: Payer: Medicare Other

## 2022-04-05 NOTE — Telephone Encounter (Signed)
Notified of message below

## 2022-04-05 NOTE — Telephone Encounter (Signed)
-----   Message from Wyatt Portela, MD sent at 04/05/2022  8:40 AM EDT ----- Please let him know his PSA is down

## 2022-04-06 ENCOUNTER — Ambulatory Visit: Payer: Medicare Other | Attending: Internal Medicine

## 2022-04-07 LAB — LIPID PANEL
Chol/HDL Ratio: 2.4 ratio (ref 0.0–5.0)
Cholesterol, Total: 195 mg/dL (ref 100–199)
HDL: 81 mg/dL (ref >39)
LDL Chol Calc (NIH): 94 mg/dL (ref 0–99)
Triglycerides: 113 mg/dL (ref 0–149)
VLDL Cholesterol Cal: 20 mg/dL (ref 5–40)

## 2022-04-09 ENCOUNTER — Encounter: Payer: Self-pay | Admitting: Orthopedic Surgery

## 2022-04-09 NOTE — Progress Notes (Signed)
Office Visit Note   Patient: Matthew Cossin Sr.           Date of Birth: [birth year]           MRN: [id number] Visit Date: 04/02/2022 Requested by: Ladell Pier, MD [place],  [place] [id number] PCP: Ladell Pier, MD  Subjective: Chief Complaint  Patient presents with   Other    Scan review    HPI: Matthew Milliron Sr. is a 66 y.o. male who presents to the office for CT  review. Patient denies any changes in symptoms.  Continues to complain mainly of left shoulder pain/stiffness.  The quality of his pain is still the same but he did have 50% relief of his pain from the glenohumeral injection and this is still lasting well.                ROS: All systems reviewed are negative as they relate to the chief complaint within the history of present illness.  Patient denies fevers or chills.  Assessment & Plan: Visit Diagnoses:  1. Primary osteoarthritis, left shoulder     Plan: Matthew Otero Sr. is a 66 y.o. male who presents to the office for the CT scan of the left shoulder.  Images were reviewed with the patient and discussed the options available for further treatment including shoulder arthroplasty which was discussed at his prior appointment.  With the 50% relief that he has gotten from glenohumeral injection he would like to just keep going to until his injections wear off and stop working.  Does not proceed with surgery after thinking about it over the last several weeks.  He will return.  6 to 8 weeks for repeat injection  Follow-Up Instructions: No follow-ups on file.   Orders:  No orders of the defined types were placed in this encounter.  No orders of the defined types were placed in this encounter.     Procedures: No procedures performed   Clinical Data: No additional findings.  Objective: Vital Signs: There were no vitals taken for this visit.  Physical Exam:  Constitutional: Patient appears well-developed HEENT:  Head:  Normocephalic Eyes:EOM are normal Neck: Normal range of motion Cardiovascular: Normal rate Pulmonary/chest: Effort normal Neurologic: Patient is alert Skin: Skin is warm Psychiatric: Patient has normal mood and affect  Ortho Exam: No change since prior exam.  Stable shoulder range of motion.  Axillary nerve is still intact.  Specialty Comments:  No specialty comments available.  Imaging: No results found.   PMFS History: Patient Active Problem List   Diagnosis Date Noted   Prostate cancer ([place]) 02/25/2022   Drug-induced constipation 07/21/2021   Malignant neoplasm of prostate ([place]) 07/21/2021   Hyperlipidemia associated with type 2 diabetes mellitus ([place]) 11/13/2020   Morbid obesity ([place]) 11/13/2020   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   DDD (degenerative disc disease), cervical 09/21/2017   DJD of acromioclavicular joint (Right) 09/21/2017   DJD of acromioclavicular joint (Bilateral) 09/21/2017   DJD of acromioclavicular joint (Left) 09/21/2017   Osteoarthritis of glenohumeral joint (Left) 09/21/2017   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/2017   Tricompartmental disease of knee (Bilateral) 09/21/2017   Osteoarthritis of knees (Bilateral) 09/21/2017   Suprapatellar effusion of knee (recurrent) (Right) 09/21/2017   Calcaneal spur of feet (Bilateral) 09/21/2017   Hypomagnesemia 09/20/2017   Low testosterone 09/20/2017   Chronic foot pain (Bilateral) 09/20/2017   Vitamin D deficiency 04/04/2017   Chronic shoulder pain (Primary Area of Pain) (Left)  03/30/2017   Chronic knee pain [facility] Area [facility]) (Left) 03/30/2017   Disorder of bone, unspecified 03/30/2017   Other reduced mobility 03/30/2017   Other specified health status 03/30/2017   Opioid use agreement exists 03/30/2017   Opiate use 03/30/2017   Chronic pain syndrome 03/30/2017   Chronic upper extremity pain (Secondary Area of Pain) (Bilateral) 03/30/2017   Elevated PSA 12/05/2015   Noncompliance  12/05/2015   Hypertension associated with diabetes ([place]) 03/14/2015   Type 2 diabetes mellitus with diabetic mononeuropathy ([place]) 03/14/2015   Encounter for health maintenance examination in adult 03/14/2015   Screening for prostate cancer 03/14/2015   Special screening for malignant neoplasms, colon 03/14/2015   Hyperlipidemia 03/14/2015   Nocturia 03/14/2015   Urinary frequency 03/14/2015   Need for prophylactic vaccination and inoculation against influenza 03/14/2015   Need for prophylactic vaccination against Streptococcus pneumoniae (pneumococcus) 03/14/2015   Need for Tdap vaccination 03/14/2015   Chronic nausea 03/14/2015   Gastroesophageal reflux disease without esophagitis 03/14/2015   Past Medical History:  Diagnosis Date   Allergy    Arthritis    DM (diabetes mellitus) ([place])    Foot pain, bilateral    referred to podiatry 06/2014   GERD (gastroesophageal reflux disease)    long history of   Hyperlipidemia    Hypertension    Polyarthralgia    shoulders, knees, ankles, wrists; neg rheum lab screen 04/2014   Prostate cancer ([place])    Type 2 diabetes mellitus with diabetic neuropathy ([place])    Wears glasses     Family History  Problem Relation Age of Onset   Diabetes Mother    Hypertension Mother    Other Father        murdered   Diabetes Sister    Arthritis Sister    Heart disease Sister    Diabetes Sister    Diabetes Sister    Cancer Neg Hx    Stroke Neg Hx    Prostate cancer Neg Hx        unknown   Bladder Cancer Neg Hx        unkown   Kidney cancer Neg Hx        unkown    Past Surgical History:  Procedure Laterality Date   COLONOSCOPY     referral pending 03/2015   NO PAST SURGERIES     as of 03/2015   Social History   Occupational History   Occupation: Engineer, manufacturing spotter/forklift  Tobacco Use   Smoking status: Some Days    Types: Cigarettes    Passive exposure: Current   Smokeless tobacco: Never  Vaping Use   Vaping Use: Never used   Substance and Sexual Activity   Alcohol use: Yes    Comment: occasional beer   Drug use: No   Sexual activity: Yes

## 2022-04-14 ENCOUNTER — Other Ambulatory Visit: Payer: Self-pay | Admitting: Internal Medicine

## 2022-04-14 DIAGNOSIS — I152 Hypertension secondary to endocrine disorders: Secondary | ICD-10-CM

## 2022-04-15 ENCOUNTER — Other Ambulatory Visit: Payer: Self-pay | Admitting: Internal Medicine

## 2022-04-15 ENCOUNTER — Telehealth: Payer: Self-pay | Admitting: Podiatry

## 2022-04-15 DIAGNOSIS — K219 Gastro-esophageal reflux disease without esophagitis: Secondary | ICD-10-CM

## 2022-04-15 DIAGNOSIS — I152 Hypertension secondary to endocrine disorders: Secondary | ICD-10-CM

## 2022-04-15 MED ORDER — OLMESARTAN MEDOXOMIL 20 MG PO TABS: 20.0000 mg | ORAL_TABLET | Freq: Every day | ORAL | 6 refills | Status: DC

## 2022-04-15 MED ORDER — PANTOPRAZOLE SODIUM 20 MG PO TBEC: 20.0000 mg | DELAYED_RELEASE_TABLET | Freq: Every day | ORAL | 0 refills | Status: DC

## 2022-04-15 MED ORDER — PREGABALIN 75 MG PO CAPS: 75.0000 mg | ORAL_CAPSULE | Freq: Two times a day (BID) | ORAL | 0 refills | Status: DC

## 2022-04-15 NOTE — Telephone Encounter (Signed)
[facility] [address]   Patient needs a refill called in on his pregabalin (LYRICA) 75 MG capsule

## 2022-04-15 NOTE — Telephone Encounter (Signed)
Copied from [place] [id number]. Topic: General - Other >> Apr 15, 2022  9:15 AM Everette C wrote: Reason for CRM: Medication Refill - Medication: pantoprazole (PROTONIX) 20 MG tablet [[id number]]  olmesartan (BENICAR) 20 MG tablet [[id number]]   Has the patient contacted their pharmacy? Yes.  The patient has been directed to contact their PCP  (Agent: If no, request that the patient contact the pharmacy for the refill. If patient does not wish to contact the pharmacy document the reason why and proceed with request.) (Agent: If yes, when and what did the pharmacy advise?)  Preferred Pharmacy (with phone number or street name): [id number] [place] [id number]: [phone]: [phone]: Not open 24 hours   Has the patient been seen for an appointment in the last year OR does the patient have an upcoming appointment? Yes.    Agent: Please be advised that RX refills may take up to 3 business days. We ask that you follow-up with your pharmacy.

## 2022-04-15 NOTE — Telephone Encounter (Signed)
Requested Prescriptions  Pending Prescriptions Disp Refills  . olmesartan (BENICAR) 20 MG tablet 30 tablet 0     Cardiovascular:  Angiotensin Receptor Blockers Failed - 04/15/2022 12:24 PM      Failed - Cr in normal range and within 180 days    Creatinine  Date Value Ref Range Status  04/01/2022 1.70 (H) 0.61 - 1.24 mg/dL Final  01/21/2022 61.9 20.0 - 300.0 mg/dL Final   Creat  Date Value Ref Range Status  12/05/2015 1.18 0.70 - 1.33 mg/dL Final         Passed - K in normal range and within 180 days    Potassium  Date Value Ref Range Status  04/01/2022 4.6 3.5 - 5.1 mmol/L Final         Passed - Patient is not pregnant      Passed - Last BP in normal range    BP Readings from Last 1 Encounters:  04/01/22 (!) 120/55         Passed - Valid encounter within last 6 months    Recent Outpatient Visits          2 months ago Type 2 diabetes mellitus with morbid obesity ([place])   [place], Neoma Laming B, MD   8 months ago Type 2 diabetes mellitus with peripheral neuropathy [facility])   [place], MD   11 months ago Need for influenza vaccination   [place], Stephen L, RPH-CPP   1 year ago Type 2 diabetes mellitus with peripheral neuropathy [facility])   [place], MD   1 year ago Hypertension associated with diabetes [facility])   [place], RPH-CPP      Future Appointments            In 4 weeks Marlou Sa, Tonna Corner, MD [facility]           . pantoprazole (PROTONIX) 20 MG tablet 90 tablet 0    Sig: Take 1 tablet (20 mg total) by mouth daily.     Gastroenterology: Proton Pump Inhibitors Passed - 04/15/2022 12:24 PM      Passed - Valid encounter within last 12 months    Recent Outpatient Visits          2 months ago Type 2 diabetes  mellitus with morbid obesity ([place])   [place] Karle Plumber B, MD   8 months ago Type 2 diabetes mellitus with peripheral neuropathy [facility])   [place], MD   11 months ago Need for influenza vaccination   [place], Stephen L, RPH-CPP   1 year ago Type 2 diabetes mellitus with peripheral neuropathy [facility])   [place], MD   1 year ago Hypertension associated with diabetes [facility])   [place], [name], RPH-CPP      Future Appointments            In 4 weeks Marlou Sa, Tonna Corner, MD [facility]

## 2022-04-15 NOTE — Telephone Encounter (Signed)
Unable to refill per protocol, last refill by provider 04/15/22. Will refuse duplicate request.  Requested Prescriptions  Pending Prescriptions Disp Refills  . olmesartan (BENICAR) 20 MG tablet 30 tablet 0     Cardiovascular:  Angiotensin Receptor Blockers Failed - 04/15/2022 12:24 PM      Failed - Cr in normal range and within 180 days    Creatinine  Date Value Ref Range Status  04/01/2022 1.70 (H) 0.61 - 1.24 mg/dL Final  01/21/2022 61.9 20.0 - 300.0 mg/dL Final   Creat  Date Value Ref Range Status  12/05/2015 1.18 0.70 - 1.33 mg/dL Final         Passed - K in normal range and within 180 days    Potassium  Date Value Ref Range Status  04/01/2022 4.6 3.5 - 5.1 mmol/L Final         Passed - Patient is not pregnant      Passed - Last BP in normal range    BP Readings from Last 1 Encounters:  04/01/22 (!) 120/55         Passed - Valid encounter within last 6 months    Recent Outpatient Visits          2 months ago Type 2 diabetes mellitus with morbid obesity ([place])   [place], Neoma Laming B, MD   8 months ago Type 2 diabetes mellitus with peripheral neuropathy [facility])   [place] Ladell Pier, MD   11 months ago Need for influenza vaccination   [place], Stephen L, RPH-CPP   1 year ago Type 2 diabetes mellitus with peripheral neuropathy [facility])   [place] Ladell Pier, MD   1 year ago Hypertension associated with diabetes [facility])   [place], RPH-CPP      Future Appointments            In 4 weeks Marlou Sa, Tonna Corner, MD [place]           Signed Prescriptions Disp Refills   pantoprazole (PROTONIX) 20 MG tablet 90 tablet 0    Sig: Take 1 tablet (20 mg total) by mouth daily.     Gastroenterology: Proton Pump Inhibitors Passed - 04/15/2022  12:24 PM      Passed - Valid encounter within last 12 months    Recent Outpatient Visits          2 months ago Type 2 diabetes mellitus with morbid obesity ([place])   [place] Karle Plumber B, MD   8 months ago Type 2 diabetes mellitus with peripheral neuropathy [facility])   [place], MD   11 months ago Need for influenza vaccination   [place], Stephen L, RPH-CPP   1 year ago Type 2 diabetes mellitus with peripheral neuropathy [facility])   [place], MD   1 year ago Hypertension associated with diabetes [facility])   [place], [name], RPH-CPP      Future Appointments            In 4 weeks Marlou Sa, Tonna Corner, MD [facility]

## 2022-04-16 MED ORDER — OLMESARTAN MEDOXOMIL 20 MG PO TABS: 20.0000 mg | ORAL_TABLET | Freq: Every day | ORAL | 6 refills | Status: DC

## 2022-04-16 NOTE — Telephone Encounter (Signed)
Requested Prescriptions  Pending Prescriptions Disp Refills  . olmesartan (BENICAR) 20 MG tablet 30 tablet 6    Sig: Take 1 tablet (20 mg total) by mouth daily.     Cardiovascular:  Angiotensin Receptor Blockers Failed - 04/16/2022  4:24 PM      Failed - Cr in normal range and within 180 days    Creatinine  Date Value Ref Range Status  04/01/2022 1.70 (H) 0.61 - 1.24 mg/dL Final  01/21/2022 61.9 20.0 - 300.0 mg/dL Final   Creat  Date Value Ref Range Status  12/05/2015 1.18 0.70 - 1.33 mg/dL Final         Passed - K in normal range and within 180 days    Potassium  Date Value Ref Range Status  04/01/2022 4.6 3.5 - 5.1 mmol/L Final         Passed - Patient is not pregnant      Passed - Last BP in normal range    BP Readings from Last 1 Encounters:  04/01/22 (!) 120/55         Passed - Valid encounter within last 6 months    Recent Outpatient Visits          2 months ago Type 2 diabetes mellitus with morbid obesity ([place])   [place], Neoma Laming B, MD   8 months ago Type 2 diabetes mellitus with peripheral neuropathy [facility])   [place] Ladell Pier, MD   12 months ago Need for influenza vaccination   [place], Stephen L, RPH-CPP   1 year ago Type 2 diabetes mellitus with peripheral neuropathy [facility])   [place] Ladell Pier, MD   1 year ago Hypertension associated with diabetes [facility])   [place], RPH-CPP      Future Appointments            In 4 weeks Marlou Sa, Tonna Corner, MD [place]           Signed Prescriptions Disp Refills   olmesartan (BENICAR) 20 MG tablet 30 tablet 6    Sig: Take 1 tablet (20 mg total) by mouth daily.     Cardiovascular:  Angiotensin Receptor Blockers Failed - 04/15/2022  2:50 PM      Failed - Cr in normal range  and within 180 days    Creatinine  Date Value Ref Range Status  04/01/2022 1.70 (H) 0.61 - 1.24 mg/dL Final  01/21/2022 61.9 20.0 - 300.0 mg/dL Final   Creat  Date Value Ref Range Status  12/05/2015 1.18 0.70 - 1.33 mg/dL Final         Passed - K in normal range and within 180 days    Potassium  Date Value Ref Range Status  04/01/2022 4.6 3.5 - 5.1 mmol/L Final         Passed - Patient is not pregnant      Passed - Last BP in normal range    BP Readings from Last 1 Encounters:  04/01/22 (!) 120/55         Passed - Valid encounter within last 6 months    Recent Outpatient Visits          2 months ago Type 2 diabetes mellitus with morbid obesity ([place])   [facility] Ladell Pier, MD  8 months ago Type 2 diabetes mellitus with peripheral neuropathy [facility])   [place], MD   12 months ago Need for influenza vaccination   [place], RPH-CPP   1 year ago Type 2 diabetes mellitus with peripheral neuropathy [facility])   [place], MD   1 year ago Hypertension associated with diabetes [facility])   [place], RPH-CPP      Future Appointments            In 4 weeks Marlou Sa, Tonna Corner, MD [place]           Rx needed to be sent to a different pharmacy

## 2022-04-16 NOTE — Addendum Note (Signed)
Addended by: Matilde Sprang on: 04/16/2022 04:24 PM   Modules accepted: Orders

## 2022-04-16 NOTE — Telephone Encounter (Signed)
Can this medication olmesartan (BENICAR) 20 MG tablet [[id number]]  [facility] [address].

## 2022-04-17 MED ORDER — OLMESARTAN MEDOXOMIL 20 MG PO TABS: 20.0000 mg | ORAL_TABLET | Freq: Every day | ORAL | 6 refills | Status: DC

## 2022-04-23 ENCOUNTER — Ambulatory Visit: Payer: Medicare Other | Admitting: Podiatry

## 2022-04-30 ENCOUNTER — Encounter: Payer: Self-pay | Admitting: *Deleted

## 2022-05-03 ENCOUNTER — Encounter: Payer: Self-pay | Admitting: Oncology

## 2022-05-04 ENCOUNTER — Encounter: Payer: Self-pay | Admitting: Oncology

## 2022-05-06 ENCOUNTER — Inpatient Hospital Stay: Payer: Medicare Other | Attending: Oncology | Admitting: *Deleted

## 2022-05-06 DIAGNOSIS — C61 Malignant neoplasm of prostate: Secondary | ICD-10-CM

## 2022-05-06 NOTE — Progress Notes (Signed)
2 Identifiers were used for verification purposes for this telephone visit. No vital signs were taken as this was not an in person visit. Pt states he has pain today 8/10 but resolving since he has taken his pain medication. Pt has chronic left shoulder and bilateral knee pain. He also has neuropathy pain to both feet. Pt says he has mild fatigue and is able to push through it to work and get tasks done.  Pt does have a hx of sleep apnea but doesn't use a CPAP because of the affordability. Pt states he is not urinating as much at night as he was when he was going through radiation. He may get up twice a night now. He does empty bladder completely with each void. . No discomfort when urinating. Last PSA was 0.3 in August. Pt is receiving Eligard every 6 months and tolerating the night sweats. Pt has not seen PCP for this year as of yet. Pt has appt for left shoulder pain  with ortho doctor 05/14/2022. Last colonoscopy was 09/30/2021. Vaccines reviewed and updated. SCP reviewed and completed.

## 2022-05-07 ENCOUNTER — Ambulatory Visit (INDEPENDENT_AMBULATORY_CARE_PROVIDER_SITE_OTHER): Payer: Medicare Other | Admitting: Podiatry

## 2022-05-07 DIAGNOSIS — S90852A Superficial foreign body, left foot, initial encounter: Secondary | ICD-10-CM

## 2022-05-07 DIAGNOSIS — L539 Erythematous condition, unspecified: Secondary | ICD-10-CM | POA: Diagnosis not present

## 2022-05-07 MED ORDER — DOXYCYCLINE HYCLATE 100 MG PO TABS: 100.0000 mg | ORAL_TABLET | Freq: Two times a day (BID) | ORAL | 0 refills | Status: DC

## 2022-05-07 NOTE — Progress Notes (Signed)
Subjective:  Patient ID: Matthew Marus Sr., male    DOB: January 31, 1956,  MRN: [id number]  Chief Complaint  Patient presents with   Foot Pain    66 y.o. male presents with the above complaint.  Patient presents with superficial glass in his foot to the left side.  Patient states that he had something that broke and he thought he cleaned it up.  But he ended up stepping on some is causing some discomfort it is very superficial and small.  He would like to have it removed.  He has not seen anyone as far to see me for this.  He denies any other acute complaints.  He is a diabetic.  No bleeding noted.   Review of Systems: Negative except as noted in the HPI. Denies N/V/F/Ch.  Past Medical History:  Diagnosis Date   Allergy    Arthritis    DM (diabetes mellitus) ([place])    Foot pain, bilateral    referred to podiatry 06/2014   GERD (gastroesophageal reflux disease)    long history of   Hyperlipidemia    Hypertension    Polyarthralgia    shoulders, knees, ankles, wrists; neg rheum lab screen 04/2014   Prostate cancer ([place])    Type 2 diabetes mellitus with diabetic neuropathy (HCC)    Wears glasses     Current Outpatient Medications:    doxycycline (VIBRA-TABS) 100 MG tablet, Take 1 tablet (100 mg total) by mouth 2 (two) times daily., Disp: 20 tablet, Rfl: 0   atorvastatin (LIPITOR) 80 MG tablet, Take 1 tablet (80 mg total) by mouth daily., Disp: 90 tablet, Rfl: 3   calcium carbonate (OS-CAL - DOSED IN MG OF ELEMENTAL CALCIUM) 1250 (500 Ca) MG tablet, Take 1 tablet by mouth daily with breakfast., Disp: , Rfl:    cholecalciferol (VITAMIN D3) 25 MCG (1000 UNIT) tablet, Take 1,000 Units by mouth daily., Disp: , Rfl:    fluticasone (FLONASE) 50 MCG/ACT nasal spray, Place 1 spray into both nostrils daily. (Patient not taking: Reported on 05/06/2022), Disp: 16 g, Rfl: 2   loratadine (CLARITIN) 10 MG tablet, Take 1 tablet (10 mg total) by mouth daily., Disp: 30 tablet, Rfl: 1   meloxicam (MOBIC)  7.5 MG tablet, TAKE 1 TABLET(7.5 MG) BY MOUTH DAILY, Disp: 30 tablet, Rfl: 1   metFORMIN (GLUCOPHAGE-XR) 500 MG 24 hr tablet, Take 1 tablet (500 mg total) by mouth daily with breakfast., Disp: 60 tablet, Rfl: 0   olmesartan (BENICAR) 20 MG tablet, Take 1 tablet (20 mg total) by mouth daily., Disp: 30 tablet, Rfl: 6   oxyCODONE-acetaminophen (PERCOCET) 10-325 MG tablet, Take 1 tablet by mouth 3 (three) times daily as needed for pain., Disp: , Rfl:    pantoprazole (PROTONIX) 20 MG tablet, Take 1 tablet (20 mg total) by mouth daily., Disp: 90 tablet, Rfl: 0   pregabalin (LYRICA) 75 MG capsule, Take 1 capsule (75 mg total) by mouth 2 (two) times daily., Disp: 30 capsule, Rfl: 0   senna (SENOKOT) 8.6 MG tablet, Take 1 tablet (8.6 mg total) by mouth daily as needed for constipation., Disp: 30 tablet, Rfl: 3   sildenafil (VIAGRA) 100 MG tablet, Take 1 tablet (100 mg total) by mouth daily as needed for erectile dysfunction., Disp: 30 tablet, Rfl: 11   tamsulosin (FLOMAX) 0.4 MG CAPS capsule, Take 1 capsule (0.4 mg total) by mouth 2 (two) times daily after a meal., Disp: 60 capsule, Rfl: 5  Social History   Tobacco Use  Smoking Status Some  Days   Types: Cigarettes   Passive exposure: Current  Smokeless Tobacco Never    No Known Allergies Objective:  There were no vitals filed for this visit. There is no height or weight on file to calculate BMI. Constitutional Well developed. Well nourished.  Vascular Dorsalis pedis pulses palpable bilaterally. Posterior tibial pulses palpable bilaterally. Capillary refill normal to all digits.  No cyanosis or clubbing noted. Pedal hair growth normal.  Neurologic Normal speech. Oriented to person, place, and time. Epicritic sensation to light touch grossly present bilaterally.  Dermatologic Entry point noted to the left heel.  No signs of infection noted no erythema noted.  No malodor present.  Appears to be to the level of the dermal layer.  Does not appear  to be penetrating the fat layer.  Orthopedic: Normal joint ROM without pain or crepitus bilaterally. No visible deformities. No bony tenderness.   Radiographs: None Assessment:   1. Foreign body in left foot, initial encounter   2. Erythema    Plan:  Patient was evaluated and treated and all questions answered.  Left foreign body/glass heel -All questions and concerns were discussed with the patient in extensive detail given that he is a diabetic is a high risk of developing infection and amputation.  At this time patient would benefit from more foreign body.  Local area was anesthetized in standard technique using chisel blade handle the lesion was debrided down at this time the glass was clinically appreciated using pickup the glass was removed in its entirety.  No abdominal glass noted.  No clinical signs of infection noted. -I will still place him on doxycycline prophylaxis for 10 days. -If any foot and ankle issues on future last I will ask him to come back and see me.  He can keep it covered with Neosporin and a Band-Aid for the next few days  No follow-ups on file.

## 2022-05-10 ENCOUNTER — Other Ambulatory Visit: Payer: Self-pay | Admitting: Physician Assistant

## 2022-05-14 ENCOUNTER — Ambulatory Visit: Payer: Medicare Other | Admitting: Orthopedic Surgery

## 2022-05-19 ENCOUNTER — Other Ambulatory Visit: Payer: Self-pay | Admitting: Urology

## 2022-05-19 ENCOUNTER — Other Ambulatory Visit: Payer: Self-pay | Admitting: Internal Medicine

## 2022-05-19 DIAGNOSIS — K219 Gastro-esophageal reflux disease without esophagitis: Secondary | ICD-10-CM

## 2022-05-20 ENCOUNTER — Other Ambulatory Visit: Payer: Self-pay | Admitting: Internal Medicine

## 2022-05-20 DIAGNOSIS — E1142 Type 2 diabetes mellitus with diabetic polyneuropathy: Secondary | ICD-10-CM

## 2022-05-20 MED ORDER — METFORMIN HCL ER 500 MG PO TB24: 500.0000 mg | ORAL_TABLET | Freq: Every day | ORAL | 0 refills | Status: DC

## 2022-05-20 MED ORDER — MELOXICAM 7.5 MG PO TABS: 7.5000 mg | ORAL_TABLET | Freq: Every day | ORAL | 0 refills | Status: DC

## 2022-05-20 NOTE — Telephone Encounter (Signed)
Requested medication (s) are due for refill today:routing for review  Requested medication (s) are on the active medication list: yes  Last refill:  02/22/22  Future visit scheduled: no  Notes to clinic:  Unable to refill per protocol, last refill by another provider. Routing for review     Requested Prescriptions  Pending Prescriptions Disp Refills   meloxicam (MOBIC) 7.5 MG tablet 30 tablet 1     Analgesics:  COX2 Inhibitors Failed - 05/20/2022  3:27 PM      Failed - Manual Review: Labs are only required if the patient has taken medication for more than 8 weeks.      Failed - HGB in normal range and within 360 days    Hemoglobin  Date Value Ref Range Status  04/01/2022 10.4 (L) 13.0 - 17.0 g/dL Final         Failed - Cr in normal range and within 360 days    Creatinine  Date Value Ref Range Status  04/01/2022 1.70 (H) 0.61 - 1.24 mg/dL Final  01/21/2022 61.9 20.0 - 300.0 mg/dL Final   Creat  Date Value Ref Range Status  12/05/2015 1.18 0.70 - 1.33 mg/dL Final         Failed - HCT in normal range and within 360 days    HCT  Date Value Ref Range Status  04/01/2022 31.3 (L) 39.0 - 52.0 % Final         Passed - AST in normal range and within 360 days    AST  Date Value Ref Range Status  04/01/2022 15 15 - 41 U/L Final         Passed - ALT in normal range and within 360 days    ALT  Date Value Ref Range Status  04/01/2022 8 0 - 44 U/L Final         Passed - eGFR is 30 or above and within 360 days    GFR calc Af Amer  Date Value Ref Range Status  05/06/2018 >60 >60 mL/min Final    Comment:    (NOTE) The eGFR has been calculated using the CKD EPI equation. This calculation has not been validated in all clinical situations. eGFR's persistently <60 mL/min signify possible Chronic Kidney Disease.    GFR, Estimated  Date Value Ref Range Status  04/01/2022 44 (L) >60 mL/min Final    Comment:    (NOTE) Calculated using the CKD-EPI Creatinine Equation  (2021)    eGFR  Date Value Ref Range Status  12/16/2020 69 >59 mL/min/1.73 Final         Passed - Patient is not pregnant      Passed - Valid encounter within last 12 months    Recent Outpatient Visits           3 months ago Type 2 diabetes mellitus with morbid obesity ([place])   [place] Karle Plumber B, MD   10 months ago Type 2 diabetes mellitus with peripheral neuropathy [facility])   [place] Ladell Pier, MD   1 year ago Need for influenza vaccination   [place], Stephen L, RPH-CPP   1 year ago Type 2 diabetes mellitus with peripheral neuropathy [facility])   [place], MD   1 year ago Hypertension associated with diabetes [facility])   [place], RPH-CPP  Future Appointments             In 1 week Marlou Sa, Tonna Corner, MD [facility]            Signed Prescriptions Disp Refills   metFORMIN (GLUCOPHAGE-XR) 500 MG 24 hr tablet 90 tablet 0    Sig: Take 1 tablet (500 mg total) by mouth daily with breakfast.     Endocrinology:  Diabetes - Biguanides Failed - 05/20/2022  3:27 PM      Failed - Cr in normal range and within 360 days    Creatinine  Date Value Ref Range Status  04/01/2022 1.70 (H) 0.61 - 1.24 mg/dL Final  01/21/2022 61.9 20.0 - 300.0 mg/dL Final   Creat  Date Value Ref Range Status  12/05/2015 1.18 0.70 - 1.33 mg/dL Final         Failed - eGFR in normal range and within 360 days    GFR calc Af Amer  Date Value Ref Range Status  05/06/2018 >60 >60 mL/min Final    Comment:    (NOTE) The eGFR has been calculated using the CKD EPI equation. This calculation has not been validated in all clinical situations. eGFR's persistently <60 mL/min signify possible Chronic Kidney Disease.    GFR, Estimated  Date Value Ref Range  Status  04/01/2022 44 (L) >60 mL/min Final    Comment:    (NOTE) Calculated using the CKD-EPI Creatinine Equation (2021)    eGFR  Date Value Ref Range Status  12/16/2020 69 >59 mL/min/1.73 Final         Failed - B12 Level in normal range and within 720 days    Vitamin B-12  Date Value Ref Range Status  03/30/2017 244 180 - 914 pg/mL Final    Comment:    (NOTE) This assay is not validated for testing neonatal or myeloproliferative syndrome specimens for Vitamin B12 levels. Performed at [facility], [place] [address] [place], [place] [address]          Failed - CBC within normal limits and completed in the last 12 months    WBC  Date Value Ref Range Status  05/06/2018 10.9 (H) 4.0 - 10.5 K/uL Final   WBC Count  Date Value Ref Range Status  04/01/2022 7.4 4.0 - 10.5 K/uL Final   RBC  Date Value Ref Range Status  04/01/2022 3.44 (L) 4.22 - 5.81 MIL/uL Final   Hemoglobin  Date Value Ref Range Status  04/01/2022 10.4 (L) 13.0 - 17.0 g/dL Final   HCT  Date Value Ref Range Status  04/01/2022 31.3 (L) 39.0 - 52.0 % Final   MCHC  Date Value Ref Range Status  04/01/2022 33.2 30.0 - 36.0 g/dL Final   [facility]  Date Value Ref Range Status  04/01/2022 30.2 26.0 - 34.0 pg Final   MCV  Date Value Ref Range Status  04/01/2022 91.0 80.0 - 100.0 fL Final   No results found for: "PLTCOUNTKUC", "LABPLAT", "POCPLA" RDW  Date Value Ref Range Status  04/01/2022 14.5 11.5 - 15.5 % Final         Passed - HBA1C is between 0 and 7.9 and within 180 days    HbA1c, POC (controlled diabetic range)  Date Value Ref Range Status  01/21/2022 5.9 0.0 - 7.0 % Final         Passed - Valid encounter within last 6 months    Recent Outpatient Visits  3 months ago Type 2 diabetes mellitus with morbid obesity [facility])   [place] Karle Plumber B, MD   10 months ago Type 2 diabetes mellitus with peripheral neuropathy [facility])   [place], MD   1 year ago Need for influenza vaccination   [place], RPH-CPP   1 year ago Type 2 diabetes mellitus with peripheral neuropathy [facility])   [place], MD   1 year ago Hypertension associated with diabetes [facility])   [place], RPH-CPP       Future Appointments             In 1 week Marlou Sa, Tonna Corner, MD [facility]

## 2022-05-20 NOTE — Telephone Encounter (Signed)
Medication Refill - Medication: meloxicam (MOBIC) 7.5 MG tablet , metFORMIN (GLUCOPHAGE-XR) 500 MG 24 hr tablet   Has the patient contacted their pharmacy? Yes.     Preferred Pharmacy (with phone number or street name):  [id number] [place]: [phone] Fax: [phone]    Has the patient been seen for an appointment in the last year OR does the patient have an upcoming appointment? Yes.    The patient states he only has 1 metformin left and 4 of the meloxicam. Please assist patient further

## 2022-05-20 NOTE — Telephone Encounter (Signed)
Requested Prescriptions  Pending Prescriptions Disp Refills   meloxicam (MOBIC) 7.5 MG tablet 30 tablet 1     Analgesics:  COX2 Inhibitors Failed - 05/20/2022  3:27 PM      Failed - Manual Review: Labs are only required if the patient has taken medication for more than 8 weeks.      Failed - HGB in normal range and within 360 days    Hemoglobin  Date Value Ref Range Status  04/01/2022 10.4 (L) 13.0 - 17.0 g/dL Final         Failed - Cr in normal range and within 360 days    Creatinine  Date Value Ref Range Status  04/01/2022 1.70 (H) 0.61 - 1.24 mg/dL Final  01/21/2022 61.9 20.0 - 300.0 mg/dL Final   Creat  Date Value Ref Range Status  12/05/2015 1.18 0.70 - 1.33 mg/dL Final         Failed - HCT in normal range and within 360 days    HCT  Date Value Ref Range Status  04/01/2022 31.3 (L) 39.0 - 52.0 % Final         Passed - AST in normal range and within 360 days    AST  Date Value Ref Range Status  04/01/2022 15 15 - 41 U/L Final         Passed - ALT in normal range and within 360 days    ALT  Date Value Ref Range Status  04/01/2022 8 0 - 44 U/L Final         Passed - eGFR is 30 or above and within 360 days    GFR calc Af Amer  Date Value Ref Range Status  05/06/2018 >60 >60 mL/min Final    Comment:    (NOTE) The eGFR has been calculated using the CKD EPI equation. This calculation has not been validated in all clinical situations. eGFR's persistently <60 mL/min signify possible Chronic Kidney Disease.    GFR, Estimated  Date Value Ref Range Status  04/01/2022 44 (L) >60 mL/min Final    Comment:    (NOTE) Calculated using the CKD-EPI Creatinine Equation (2021)    eGFR  Date Value Ref Range Status  12/16/2020 69 >59 mL/min/1.73 Final         Passed - Patient is not pregnant      Passed - Valid encounter within last 12 months    Recent Outpatient Visits           3 months ago Type 2 diabetes mellitus with morbid obesity ([place])   [place] Karle Plumber B, MD   10 months ago Type 2 diabetes mellitus with peripheral neuropathy [facility])   [place] Ladell Pier, MD   1 year ago Need for influenza vaccination   [place], Stephen L, RPH-CPP   1 year ago Type 2 diabetes mellitus with peripheral neuropathy [facility])   [place], Deborah B, MD   1 year ago Hypertension associated with diabetes [facility])   [place], RPH-CPP       Future Appointments             In 1 week Marlou Sa, Tonna Corner, MD [place]             metFORMIN (GLUCOPHAGE-XR) 500 MG 24 hr tablet 90 tablet 0  Sig: Take 1 tablet (500 mg total) by mouth daily with breakfast.     Endocrinology:  Diabetes - Biguanides Failed - 05/20/2022  3:27 PM      Failed - Cr in normal range and within 360 days    Creatinine  Date Value Ref Range Status  04/01/2022 1.70 (H) 0.61 - 1.24 mg/dL Final  01/21/2022 61.9 20.0 - 300.0 mg/dL Final   Creat  Date Value Ref Range Status  12/05/2015 1.18 0.70 - 1.33 mg/dL Final         Failed - eGFR in normal range and within 360 days    GFR calc Af Amer  Date Value Ref Range Status  05/06/2018 >60 >60 mL/min Final    Comment:    (NOTE) The eGFR has been calculated using the CKD EPI equation. This calculation has not been validated in all clinical situations. eGFR's persistently <60 mL/min signify possible Chronic Kidney Disease.    GFR, Estimated  Date Value Ref Range Status  04/01/2022 44 (L) >60 mL/min Final    Comment:    (NOTE) Calculated using the CKD-EPI Creatinine Equation (2021)    eGFR  Date Value Ref Range Status  12/16/2020 69 >59 mL/min/1.73 Final         Failed - B12 Level in normal range and within 720 days    Vitamin B-12  Date Value Ref Range Status  03/30/2017 244 180 - 914  pg/mL Final    Comment:    (NOTE) This assay is not validated for testing neonatal or myeloproliferative syndrome specimens for Vitamin B12 levels. Performed at [facility], [place] [address] [place], [place] [address]          Failed - CBC within normal limits and completed in the last 12 months    WBC  Date Value Ref Range Status  05/06/2018 10.9 (H) 4.0 - 10.5 K/uL Final   WBC Count  Date Value Ref Range Status  04/01/2022 7.4 4.0 - 10.5 K/uL Final   RBC  Date Value Ref Range Status  04/01/2022 3.44 (L) 4.22 - 5.81 MIL/uL Final   Hemoglobin  Date Value Ref Range Status  04/01/2022 10.4 (L) 13.0 - 17.0 g/dL Final   HCT  Date Value Ref Range Status  04/01/2022 31.3 (L) 39.0 - 52.0 % Final   MCHC  Date Value Ref Range Status  04/01/2022 33.2 30.0 - 36.0 g/dL Final   [facility]  Date Value Ref Range Status  04/01/2022 30.2 26.0 - 34.0 pg Final   MCV  Date Value Ref Range Status  04/01/2022 91.0 80.0 - 100.0 fL Final   No results found for: "PLTCOUNTKUC", "LABPLAT", "POCPLA" RDW  Date Value Ref Range Status  04/01/2022 14.5 11.5 - 15.5 % Final         Passed - HBA1C is between 0 and 7.9 and within 180 days    HbA1c, POC (controlled diabetic range)  Date Value Ref Range Status  01/21/2022 5.9 0.0 - 7.0 % Final         Passed - Valid encounter within last 6 months    Recent Outpatient Visits           3 months ago Type 2 diabetes mellitus with morbid obesity [facility])   [place] Karle Plumber B, MD   10 months ago Type 2 diabetes mellitus with peripheral neuropathy [facility])   [place] Ladell Pier, MD   1 year ago  Need for influenza vaccination   [place], RPH-CPP   1 year ago Type 2 diabetes mellitus with peripheral neuropathy [facility])   [place], MD   1 year ago Hypertension  associated with diabetes [facility])   [place], RPH-CPP       Future Appointments             In 1 week Marlou Sa, Tonna Corner, MD [facility]

## 2022-06-02 ENCOUNTER — Ambulatory Visit: Payer: Medicare Other | Admitting: Orthopedic Surgery

## 2022-06-04 ENCOUNTER — Encounter (HOSPITAL_COMMUNITY): Payer: Self-pay

## 2022-06-04 ENCOUNTER — Ambulatory Visit (HOSPITAL_COMMUNITY)
Admission: RE | Admit: 2022-06-04 | Discharge: 2022-06-04 | Disposition: A | Discharge status: Home / Self Care | Payer: Medicare Other | Source: Ambulatory Visit | Attending: Physician Assistant | Admitting: Physician Assistant

## 2022-06-04 VITALS — BP 141/73 | HR 85 | Temp 98.7°F | Resp 16

## 2022-06-04 DIAGNOSIS — J0140 Acute pansinusitis, unspecified: Secondary | ICD-10-CM

## 2022-06-04 DIAGNOSIS — Z7729 Contact with and (suspected ) exposure to other hazardous substances: Secondary | ICD-10-CM

## 2022-06-04 MED ORDER — LORATADINE 10 MG PO TABS: 10.0000 mg | ORAL_TABLET | Freq: Every day | ORAL | 1 refills | Status: DC

## 2022-06-04 MED ORDER — FLUTICASONE PROPIONATE 50 MCG/ACT NA SUSP: 1.0000 | Freq: Every day | NASAL | 0 refills | Status: DC

## 2022-06-04 MED ORDER — AMOXICILLIN-POT CLAVULANATE 875-125 MG PO TABS: 1.0000 | ORAL_TABLET | Freq: Two times a day (BID) | ORAL | 0 refills | Status: DC

## 2022-06-04 NOTE — Discharge Instructions (Signed)
Start Augmentin twice daily for 7 days.  Start allergy medication to help your symptoms.  Use Mucinex for additional symptom relief.  Rest and drink plenty of fluid.  If your symptoms are proving within a few days return for reevaluation.  If anything worsens and you develop fever, chest pain, shortness of breath, nausea/vomiting interfere with oral intake, weakness you need to be seen immediately.

## 2022-06-04 NOTE — ED Provider Notes (Signed)
[place]    CSN: [id number] Arrival date & time: 06/04/22  3762      History   Chief Complaint Chief Complaint  Patient presents with   Cough    HPI Matthew Juhasz Sr. is a 66 y.o. male.   Patient presents today with a 10-day history of URI symptoms including nasal congestion, sinus pressure, cough.  Denies any fever, chest pain, shortness of breath, nausea, vomiting, diarrhea.  She does have a history of prostate cancer but this is managed with endocrine therapy and is not currently receiving chemotherapy.  He does have a history of diabetes this is well-controlled with last A1c 5.9% 01/21/2022.  He was prescribed doxycycline for foot infection by podiatry approximately 1 month ago but denies additional antibiotics in the past 90 days.  He does have a history of allergies and has been prescribed allergy medication in the past but is not taking this regularly now.  He is up-to-date on immunizations including COVID-vaccine.  Denies any known sick contacts.  Has not tried any over-the-counter medication for symptom management.    Past Medical History:  Diagnosis Date   Allergy    Arthritis    DM (diabetes mellitus) (HCC)    Foot pain, bilateral    referred to podiatry 06/2014   GERD (gastroesophageal reflux disease)    long history of   Hyperlipidemia    Hypertension    Polyarthralgia    shoulders, knees, ankles, wrists; neg rheum lab screen 04/2014   Prostate cancer ([place])    Type 2 diabetes mellitus with diabetic neuropathy ([place])    Wears glasses     Patient Active Problem List   Diagnosis Date Noted   Prostate cancer ([place]) 02/25/2022   Drug-induced constipation 07/21/2021   Malignant neoplasm of prostate ([place]) 07/21/2021   Hyperlipidemia associated with type 2 diabetes mellitus ([place]) 11/13/2020   Morbid obesity ([place]) 11/13/2020   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   DDD (degenerative disc disease), cervical 09/21/2017   DJD of acromioclavicular  joint (Right) 09/21/2017   DJD of acromioclavicular joint (Bilateral) 09/21/2017   DJD of acromioclavicular joint (Left) 09/21/2017   Osteoarthritis of glenohumeral joint (Left) 09/21/2017   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/2017   Tricompartmental disease of knee (Bilateral) 09/21/2017   Osteoarthritis of knees (Bilateral) 09/21/2017   Suprapatellar effusion of knee (recurrent) (Right) 09/21/2017   Calcaneal spur of feet (Bilateral) 09/21/2017   Hypomagnesemia 09/20/2017   Low testosterone 09/20/2017   Chronic foot pain (Bilateral) 09/20/2017   Vitamin D deficiency 04/04/2017   Chronic shoulder pain (Primary Area of Pain) (Left) 03/30/2017   Chronic knee pain (Tertiary Area of Pain) (Left) 03/30/2017   Disorder of bone, unspecified 03/30/2017   Other reduced mobility 03/30/2017   Other specified health status 03/30/2017   Opioid use agreement exists 03/30/2017   Opiate use 03/30/2017   Chronic pain syndrome 03/30/2017   Chronic upper extremity pain (Secondary Area of Pain) (Bilateral) 03/30/2017   Elevated PSA 12/05/2015   Noncompliance 12/05/2015   Hypertension associated with diabetes ([place]) 03/14/2015   Type 2 diabetes mellitus with diabetic mononeuropathy ([place]) 03/14/2015   Encounter for health maintenance examination in adult 03/14/2015   Screening for prostate cancer 03/14/2015   Special screening for malignant neoplasms, colon 03/14/2015   Hyperlipidemia 03/14/2015   Nocturia 03/14/2015   Urinary frequency 03/14/2015   Need for prophylactic vaccination and inoculation against influenza 03/14/2015   Need for prophylactic vaccination against Streptococcus pneumoniae (pneumococcus) 03/14/2015   Need for Tdap vaccination  03/14/2015   Chronic nausea 03/14/2015   Gastroesophageal reflux disease without esophagitis 03/14/2015    Past Surgical History:  Procedure Laterality Date   COLONOSCOPY     referral pending 03/2015   NO PAST SURGERIES     as of 03/2015        Home Medications    Prior to Admission medications   Medication Sig Start Date End Date Taking? Authorizing Provider  amoxicillin-clavulanate (AUGMENTIN) 875-125 MG tablet Take 1 tablet by mouth every 12 (twelve) hours. 06/04/22  Yes Berton Butrick K, PA-C  fluticasone (FLONASE) 50 MCG/ACT nasal spray Place 1 spray into both nostrils daily. 06/04/22  Yes Sabriyah Wilcher K, PA-C  atorvastatin (LIPITOR) 80 MG tablet Take 1 tablet (80 mg total) by mouth daily. 11/13/20   Ladell Pier, MD  calcium carbonate (OS-CAL - DOSED IN MG OF ELEMENTAL CALCIUM) 1250 (500 Ca) MG tablet Take 1 tablet by mouth daily with breakfast.    [provider]  cholecalciferol (VITAMIN D3) 25 MCG (1000 UNIT) tablet Take 1,000 Units by mouth daily.    [provider]  loratadine (CLARITIN) 10 MG tablet Take 1 tablet (10 mg total) by mouth daily. 06/04/22   Ceci Taliaferro, Derry Skill, PA-C  meloxicam (MOBIC) 7.5 MG tablet Take 1 tablet (7.5 mg total) by mouth daily. 05/20/22   Ladell Pier, MD  metFORMIN (GLUCOPHAGE-XR) 500 MG 24 hr tablet Take 1 tablet (500 mg total) by mouth daily with breakfast. 05/20/22   Ladell Pier, MD  olmesartan (BENICAR) 20 MG tablet Take 1 tablet (20 mg total) by mouth daily. 04/17/22   Ladell Pier, MD  oxyCODONE-acetaminophen (PERCOCET) 10-325 MG tablet Take 1 tablet by mouth 3 (three) times daily as needed for pain. 04/05/22   [provider]  pantoprazole (PROTONIX) 20 MG tablet TAKE 1 TABLET BY MOUTH EVERY DAY 05/19/22   Ladell Pier, MD  pregabalin (LYRICA) 75 MG capsule Take 1 capsule (75 mg total) by mouth 2 (two) times daily. 09/28/21   Edrick Kins, DPM  senna (SENOKOT) 8.6 MG tablet Take 1 tablet (8.6 mg total) by mouth daily as needed for constipation. 07/21/21   Ladell Pier, MD  sildenafil (VIAGRA) 100 MG tablet Take 1 tablet (100 mg total) by mouth daily as needed for erectile dysfunction. 02/25/22   Billey Co, MD  tamsulosin  (FLOMAX) 0.4 MG CAPS capsule Take 1 capsule (0.4 mg total) by mouth 2 (two) times daily after a meal. 12/17/21   Bruning, Ashlyn, PA-C    Family History Family History  Problem Relation Age of Onset   Diabetes Mother    Hypertension Mother    Other Father        murdered   Diabetes Sister    Arthritis Sister    Heart disease Sister    Diabetes Sister    Diabetes Sister    Cancer Neg Hx    Stroke Neg Hx    Prostate cancer Neg Hx        unknown   Bladder Cancer Neg Hx        unkown   Kidney cancer Neg Hx        unkown    Social History Social History   Tobacco Use   Smoking status: Some Days    Types: Cigarettes    Passive exposure: Current   Smokeless tobacco: Never  Vaping Use   Vaping Use: Never used  Substance Use Topics   Alcohol use: Yes  Comment: occasional beer   Drug use: No     Allergies   Patient has no known allergies.   Review of Systems Review of Systems  Constitutional:  Positive for activity change. Negative for appetite change, fatigue and fever.  HENT:  Positive for congestion, postnasal drip and sinus pressure. Negative for sneezing and sore throat.   Respiratory:  Positive for cough. Negative for shortness of breath.   Cardiovascular:  Negative for chest pain.  Gastrointestinal:  Negative for abdominal pain, diarrhea, nausea and vomiting.  Neurological:  Negative for dizziness, light-headedness and headaches.     Physical Exam Triage Vital Signs ED Triage Vitals  Enc Vitals Group     BP 06/04/22 0925 (!) 141/73     Pulse Rate 06/04/22 0925 85     Resp 06/04/22 0925 16     Temp 06/04/22 0925 98.7 F (37.1 C)     Temp Source 06/04/22 0925 Oral     SpO2 06/04/22 0925 97 %     Weight --      Height --      Head Circumference --      Peak Flow --      Pain Score 06/04/22 0924 0     Pain Loc --      Pain Edu? --      Excl. in [place]? --    No data found.  Updated Vital Signs BP (!) 141/73 (BP Location: Right Arm)   Pulse 85    Temp 98.7 F (37.1 C) (Oral)   Resp 16   SpO2 97%   Visual Acuity Right Eye Distance:   Left Eye Distance:   Bilateral Distance:    Right Eye Near:   Left Eye Near:    Bilateral Near:     Physical Exam Vitals reviewed.  Constitutional:      General: He is awake.     Appearance: Normal appearance. He is well-developed. He is not ill-appearing.     Comments: Very pleasant male appears stated age in no acute distress sitting comfortably in exam room  HENT:     Head: Normocephalic and atraumatic.     Right Ear: Tympanic membrane, ear canal and external ear normal. Tympanic membrane is not erythematous or bulging.     Left Ear: Tympanic membrane, ear canal and external ear normal. Tympanic membrane is not erythematous or bulging.     Nose:     Right Sinus: Maxillary sinus tenderness present. No frontal sinus tenderness.     Left Sinus: Maxillary sinus tenderness present. No frontal sinus tenderness.     Mouth/Throat:     Pharynx: Uvula midline. Posterior oropharyngeal erythema present. No oropharyngeal exudate.  Cardiovascular:     Rate and Rhythm: Normal rate and regular rhythm.     Heart sounds: Normal heart sounds, S1 normal and S2 normal. No murmur heard. Pulmonary:     Effort: Pulmonary effort is normal. No accessory muscle usage or respiratory distress.     Breath sounds: Normal breath sounds. No stridor. No wheezing, rhonchi or rales.     Comments: Clear to auscultation bilaterally Abdominal:     General: Bowel sounds are normal.     Palpations: Abdomen is soft.     Tenderness: There is no abdominal tenderness.  Neurological:     Mental Status: He is alert.  Psychiatric:        Behavior: Behavior is cooperative.      UC Treatments / Results  Labs (all labs ordered are  listed, but only abnormal results are displayed) Labs Reviewed - No data to display  EKG   Radiology No results found.  Procedures Procedures (including critical care time)  Medications  Ordered in UC Medications - No data to display  Initial Impression / Assessment and Plan / UC Course  I have reviewed the triage vital signs and the nursing notes.  Pertinent labs & imaging results that were available during my care of the patient were reviewed by me and considered in my medical decision making (see chart for details).     Patient is well-appearing, afebrile, nontoxic, nontachycardic.  No indication for viral testing as he has been symptomatic for 10 days and this would not change management.  Chest x-ray was deferred as he has an oxygen saturation of 97% with no adventitious lung sounds on exam.  Given prolonged and worsening symptoms will cover for secondary bacterial infection.  He was started on Augmentin twice daily for 7 days.  Recommended that he begin his allergy medication regimen again and refills of Claritin and Flonase were provided.  He can use over-the-counter medication for additional symptom relief including Mucinex.  He is to rest and drink plenty of fluid.  Discussed that if his symptoms or not improving within a few days he needs to return for reevaluation.  If at any point he has worsening symptoms including fever, chest pain, shortness of breath, nausea/vomiting interfere with oral intake, weakness he needs to go to the emergency room immediately.  Strict return precautions given.  Work excuse note provided.  Final Clinical Impressions(s) / UC Diagnoses   Final diagnoses:  Acute non-recurrent pansinusitis     Discharge Instructions      Start Augmentin twice daily for 7 days.  Start allergy medication to help your symptoms.  Use Mucinex for additional symptom relief.  Rest and drink plenty of fluid.  If your symptoms are proving within a few days return for reevaluation.  If anything worsens and you develop fever, chest pain, shortness of breath, nausea/vomiting interfere with oral intake, weakness you need to be seen immediately.     ED Prescriptions      Medication Sig Dispense Auth. Provider   loratadine (CLARITIN) 10 MG tablet Take 1 tablet (10 mg total) by mouth daily. 30 tablet Ildefonso Keaney K, PA-C   fluticasone (FLONASE) 50 MCG/ACT nasal spray Place 1 spray into both nostrils daily. 16 g Nayelli Inglis K, PA-C   amoxicillin-clavulanate (AUGMENTIN) 875-125 MG tablet Take 1 tablet by mouth every 12 (twelve) hours. 14 tablet Gaige Sebo, Derry Skill, PA-C      PDMP not reviewed this encounter.   Terrilee Croak, PA-C 06/04/22 [id number]

## 2022-06-04 NOTE — ED Triage Notes (Signed)
Cough and congestion for over a week. States he has not been taking anything at home for the cough.

## 2022-06-14 ENCOUNTER — Ambulatory Visit: Payer: Medicare Other | Admitting: Orthopedic Surgery

## 2022-06-17 ENCOUNTER — Other Ambulatory Visit: Payer: Self-pay | Admitting: Internal Medicine

## 2022-06-21 ENCOUNTER — Other Ambulatory Visit: Payer: Self-pay | Admitting: Internal Medicine

## 2022-06-21 DIAGNOSIS — E1142 Type 2 diabetes mellitus with diabetic polyneuropathy: Secondary | ICD-10-CM

## 2022-07-06 ENCOUNTER — Other Ambulatory Visit: Payer: Self-pay | Admitting: Internal Medicine

## 2022-07-11 ENCOUNTER — Other Ambulatory Visit: Payer: Self-pay | Admitting: Internal Medicine

## 2022-07-11 DIAGNOSIS — E1142 Type 2 diabetes mellitus with diabetic polyneuropathy: Secondary | ICD-10-CM

## 2022-07-12 NOTE — Telephone Encounter (Signed)
Requested Prescriptions  Pending Prescriptions Disp Refills   metFORMIN (GLUCOPHAGE-XR) 500 MG 24 hr tablet [Pharmacy Med Name: METFORMIN HCL ER 500 MG TABLET] 90 tablet 0    Sig: TAKE 1 TABLET BY MOUTH EVERY DAY WITH BREAKFAST     Endocrinology:  Diabetes - Biguanides Failed - 07/11/2022  1:23 PM      Failed - Cr in normal range and within 360 days    Creatinine  Date Value Ref Range Status  04/01/2022 1.70 (H) 0.61 - 1.24 mg/dL Final  01/21/2022 61.9 20.0 - 300.0 mg/dL Final   Creat  Date Value Ref Range Status  12/05/2015 1.18 0.70 - 1.33 mg/dL Final         Failed - eGFR in normal range and within 360 days    GFR calc Af Amer  Date Value Ref Range Status  05/06/2018 >60 >60 mL/min Final    Comment:    (NOTE) The eGFR has been calculated using the CKD EPI equation. This calculation has not been validated in all clinical situations. eGFR's persistently <60 mL/min signify possible Chronic Kidney Disease.    GFR, Estimated  Date Value Ref Range Status  04/01/2022 44 (L) >60 mL/min Final    Comment:    (NOTE) Calculated using the CKD-EPI Creatinine Equation (2021)    eGFR  Date Value Ref Range Status  12/16/2020 69 >59 mL/min/1.73 Final         Failed - B12 Level in normal range and within 720 days    Vitamin B-12  Date Value Ref Range Status  03/30/2017 244 180 - 914 pg/mL Final    Comment:    (NOTE) This assay is not validated for testing neonatal or myeloproliferative syndrome specimens for Vitamin B12 levels. Performed at [facility], [place] [address], [place], [place] [address]          Failed - CBC within normal limits and completed in the last 12 months    WBC  Date Value Ref Range Status  05/06/2018 10.9 (H) 4.0 - 10.5 K/uL Final   WBC Count  Date Value Ref Range Status  04/01/2022 7.4 4.0 - 10.5 K/uL Final   RBC  Date Value Ref Range Status  04/01/2022 3.44 (L) 4.22 - 5.81 MIL/uL Final   Hemoglobin  Date Value Ref Range Status   04/01/2022 10.4 (L) 13.0 - 17.0 g/dL Final   HCT  Date Value Ref Range Status  04/01/2022 31.3 (L) 39.0 - 52.0 % Final   MCHC  Date Value Ref Range Status  04/01/2022 33.2 30.0 - 36.0 g/dL Final   [facility]  Date Value Ref Range Status  04/01/2022 30.2 26.0 - 34.0 pg Final   MCV  Date Value Ref Range Status  04/01/2022 91.0 80.0 - 100.0 fL Final   No results found for: "PLTCOUNTKUC", "LABPLAT", "POCPLA" RDW  Date Value Ref Range Status  04/01/2022 14.5 11.5 - 15.5 % Final         Passed - HBA1C is between 0 and 7.9 and within 180 days    HbA1c, POC (controlled diabetic range)  Date Value Ref Range Status  01/21/2022 5.9 0.0 - 7.0 % Final         Passed - Valid encounter within last 6 months    Recent Outpatient Visits           5 months ago Type 2 diabetes mellitus with morbid obesity [facility])   [place] [facility] Ladell Pier, MD  11 months ago Type 2 diabetes mellitus with peripheral neuropathy [facility])   [place], MD   1 year ago Need for influenza vaccination   [place], RPH-CPP   1 year ago Type 2 diabetes mellitus with peripheral neuropathy [facility])   [place], MD   1 year ago Hypertension associated with diabetes [facility])   [place], [name], RPH-CPP       Future Appointments             In 3 weeks [place], Blondell Reveal, MD [facility], [facility]

## 2022-07-15 ENCOUNTER — Telehealth: Payer: Self-pay | Admitting: Hematology and Oncology

## 2022-07-15 NOTE — Telephone Encounter (Signed)
Patient called to confirm new MD and appointment time for f/u. Patient notified.

## 2022-07-19 ENCOUNTER — Other Ambulatory Visit: Payer: Self-pay | Admitting: Urology

## 2022-07-20 NOTE — Telephone Encounter (Signed)
Patient no longer under our care. Refills should be requested through his urologist, Dr. Diamantina Providence.

## 2022-07-22 ENCOUNTER — Other Ambulatory Visit: Payer: Self-pay | Admitting: Internal Medicine

## 2022-07-22 DIAGNOSIS — K219 Gastro-esophageal reflux disease without esophagitis: Secondary | ICD-10-CM

## 2022-07-22 DIAGNOSIS — E1142 Type 2 diabetes mellitus with diabetic polyneuropathy: Secondary | ICD-10-CM

## 2022-07-22 NOTE — Telephone Encounter (Signed)
Requested medication (s) are due for refill today: Yes  Requested medication (s) are on the active medication list: Yes  Last refill:  See chart  Future visit scheduled: No  Notes to clinic:  Left message to call and make appointment.    Requested Prescriptions  Pending Prescriptions Disp Refills   pantoprazole (PROTONIX) 20 MG tablet [Pharmacy Med Name: PANTOPRAZOLE SOD DR 20 MG TAB] 30 tablet 0    Sig: TAKE 1 TABLET BY MOUTH EVERY DAY     Gastroenterology: Proton Pump Inhibitors Passed - 07/22/2022  9:30 AM      Passed - Valid encounter within last 12 months    Recent Outpatient Visits           6 months ago Type 2 diabetes mellitus with morbid obesity ([place])   [place] Karle Plumber B, MD   1 year ago Type 2 diabetes mellitus with peripheral neuropathy [facility])   [place] Ladell Pier, MD   1 year ago Need for influenza vaccination   [place], Stephen L, RPH-CPP   1 year ago Type 2 diabetes mellitus with peripheral neuropathy [facility])   [place], MD   1 year ago Hypertension associated with diabetes [facility])   [place], RPH-CPP       Future Appointments             Tomorrow [place], Gerrianne Scale, PA-C [place]   In 1 week [place], Blondell Reveal, MD [place] [facility], [facility]   In 3 weeks Magnant, Gerrianne Scale, PA-C [place]             meloxicam (MOBIC) 7.5 MG tablet [Pharmacy Med Name: MELOXICAM 7.5 MG TABLET] 30 tablet 0    Sig: Take 1 tablet (7.5 mg total) by mouth daily. Please make an appt     Analgesics:  COX2 Inhibitors Failed - 07/22/2022  9:30 AM      Failed - Manual Review: Labs are only required if the patient has taken medication for more than 8 weeks.      Failed - HGB in  normal range and within 360 days    Hemoglobin  Date Value Ref Range Status  04/01/2022 10.4 (L) 13.0 - 17.0 g/dL Final         Failed - Cr in normal range and within 360 days    Creatinine  Date Value Ref Range Status  04/01/2022 1.70 (H) 0.61 - 1.24 mg/dL Final  01/21/2022 61.9 20.0 - 300.0 mg/dL Final   Creat  Date Value Ref Range Status  12/05/2015 1.18 0.70 - 1.33 mg/dL Final         Failed - HCT in normal range and within 360 days    HCT  Date Value Ref Range Status  04/01/2022 31.3 (L) 39.0 - 52.0 % Final         Passed - AST in normal range and within 360 days    AST  Date Value Ref Range Status  04/01/2022 15 15 - 41 U/L Final         Passed - ALT in normal range and within 360 days    ALT  Date Value Ref Range Status  04/01/2022 8 0 - 44 U/L Final  Passed - eGFR is 30 or above and within 360 days    GFR calc Af Amer  Date Value Ref Range Status  05/06/2018 >60 >60 mL/min Final    Comment:    (NOTE) The eGFR has been calculated using the CKD EPI equation. This calculation has not been validated in all clinical situations. eGFR's persistently <60 mL/min signify possible Chronic Kidney Disease.    GFR, Estimated  Date Value Ref Range Status  04/01/2022 44 (L) >60 mL/min Final    Comment:    (NOTE) Calculated using the CKD-EPI Creatinine Equation (2021)    eGFR  Date Value Ref Range Status  12/16/2020 69 >59 mL/min/1.73 Final         Passed - Patient is not pregnant      Passed - Valid encounter within last 12 months    Recent Outpatient Visits           6 months ago Type 2 diabetes mellitus with morbid obesity ([place])   [place], Neoma Laming B, MD   1 year ago Type 2 diabetes mellitus with peripheral neuropathy ([place])   [place] Ladell Pier, MD   1 year ago Need for influenza vaccination   [place], Stephen L,  RPH-CPP   1 year ago Type 2 diabetes mellitus with peripheral neuropathy ([place])   [place] Ladell Pier, MD   1 year ago Hypertension associated with diabetes [facility])   [place], [name], RPH-CPP       Future Appointments             Tomorrow Magnant, Gerrianne Scale, PA-C [place]   In 1 week [place], Blondell Reveal, MD [place] [facility], [facility]   In 3 weeks Magn[place], Gerrianne Scale, PA-C [facility]             metFORMIN (GLUCOPHAGE-XR) 500 MG 24 hr tablet [Pharmacy Med Name: METFORMIN HCL ER 500 MG TABLET] 90 tablet 0    Sig: TAKE 1 TABLET BY MOUTH [place]     Endocrinology:  Diabetes - Biguanides Failed - 07/22/2022  9:30 AM      Failed - Cr in normal range and within 360 days    Creatinine  Date Value Ref Range Status  04/01/2022 1.70 (H) 0.61 - 1.24 mg/dL Final  01/21/2022 61.9 20.0 - 300.0 mg/dL Final   Creat  Date Value Ref Range Status  12/05/2015 1.18 0.70 - 1.33 mg/dL Final         Failed - HBA1C is between 0 and 7.9 and within 180 days    HbA1c, POC (controlled diabetic range)  Date Value Ref Range Status  01/21/2022 5.9 0.0 - 7.0 % Final         Failed - eGFR in normal range and within 360 days    GFR calc Af Amer  Date Value Ref Range Status  05/06/2018 >60 >60 mL/min Final    Comment:    (NOTE) The eGFR has been calculated using the CKD EPI equation. This calculation has not been validated in all clinical situations. eGFR's persistently <60 mL/min signify possible Chronic Kidney Disease.    GFR, Estimated  Date Value Ref Range Status  04/01/2022 44 (L) >60 mL/min Final    Comment:    (NOTE) Calculated using  the CKD-EPI Creatinine Equation (2021)    eGFR  Date Value Ref Range Status  12/16/2020 69 >59 mL/min/1.73 Final         Failed - B12 Level in normal range and within 720 days     Vitamin B-12  Date Value Ref Range Status  03/30/2017 244 180 - 914 pg/mL Final    Comment:    (NOTE) This assay is not validated for testing neonatal or myeloproliferative syndrome specimens for Vitamin B12 levels. Performed at [facility], [place] [address] [place], [place] [address]          Failed - Valid encounter within last 6 months    Recent Outpatient Visits           6 months ago Type 2 diabetes mellitus with morbid obesity ([place])   [place] Karle Plumber B, MD   1 year ago Type 2 diabetes mellitus with peripheral neuropathy ([place])   [place] Ladell Pier, MD   1 year ago Need for influenza vaccination   [place], Stephen L, RPH-CPP   1 year ago Type 2 diabetes mellitus with peripheral neuropathy [facility])   [place] Ladell Pier, MD   1 year ago Hypertension associated with diabetes [facility])   [place], [name], RPH-CPP       Future Appointments             Tomorrow Magnant, Gerrianne Scale, PA-C [place]   In 1 week [place], Blondell Reveal, MD [facility], [facility]   In 3 weeks Magn[place], Gerrianne Scale, PA-C [place]            Failed - CBC within normal limits and completed in the last 12 months    WBC  Date Value Ref Range Status  05/06/2018 10.9 (H) 4.0 - 10.5 K/uL Final   WBC Count  Date Value Ref Range Status  04/01/2022 7.4 4.0 - 10.5 K/uL Final   RBC  Date Value Ref Range Status  04/01/2022 3.44 (L) 4.22 - 5.81 MIL/uL Final   Hemoglobin  Date Value Ref Range Status  04/01/2022 10.4 (L) 13.0 - 17.0 g/dL Final   HCT  Date Value Ref Range Status  04/01/2022 31.3 (L) 39.0 - 52.0 % Final   MCHC  Date Value Ref Range Status  04/01/2022 33.2 30.0 - 36.0 g/dL Final   [facility]  Date Value  Ref Range Status  04/01/2022 30.2 26.0 - 34.0 pg Final   MCV  Date Value Ref Range Status  04/01/2022 91.0 80.0 - 100.0 fL Final   No results found for: "PLTCOUNTKUC", "LABPLAT", "POCPLA" RDW  Date Value Ref Range Status  04/01/2022 14.5 11.5 - 15.5 % Final

## 2022-07-23 ENCOUNTER — Ambulatory Visit: Payer: Medicare Other | Admitting: Surgical

## 2022-07-23 ENCOUNTER — Encounter: Payer: Self-pay | Admitting: *Deleted

## 2022-07-28 ENCOUNTER — Telehealth: Payer: Medicare Other | Admitting: Urology

## 2022-08-02 ENCOUNTER — Ambulatory Visit (HOSPITAL_BASED_OUTPATIENT_CLINIC_OR_DEPARTMENT_OTHER): Payer: Medicare Other | Admitting: Family Medicine

## 2022-08-02 ENCOUNTER — Encounter: Payer: Self-pay | Admitting: Surgical

## 2022-08-02 ENCOUNTER — Ambulatory Visit (INDEPENDENT_AMBULATORY_CARE_PROVIDER_SITE_OTHER): Payer: Medicare Other | Admitting: Surgical

## 2022-08-02 DIAGNOSIS — M1712 Unilateral primary osteoarthritis, left knee: Secondary | ICD-10-CM | POA: Diagnosis not present

## 2022-08-02 DIAGNOSIS — M170 Bilateral primary osteoarthritis of knee: Secondary | ICD-10-CM

## 2022-08-02 DIAGNOSIS — M1711 Unilateral primary osteoarthritis, right knee: Secondary | ICD-10-CM | POA: Diagnosis not present

## 2022-08-02 MED ORDER — LIDOCAINE HCL 1 % IJ SOLN
5.0000 mL | INTRAMUSCULAR | Status: AC | PRN
  Administered 2022-08-02: 5 mL

## 2022-08-02 MED ORDER — METHYLPREDNISOLONE ACETATE 40 MG/ML IJ SUSP
40.0000 mg | INTRAMUSCULAR | Status: AC | PRN
  Administered 2022-08-02: 40 mg via INTRA_ARTICULAR

## 2022-08-02 MED ORDER — BUPIVACAINE HCL 0.25 % IJ SOLN
4.0000 mL | INTRAMUSCULAR | Status: AC | PRN
  Administered 2022-08-02: 4 mL via INTRA_ARTICULAR

## 2022-08-02 NOTE — Progress Notes (Signed)
Office Visit Note   Patient: Matthew Pryor Sr.           Date of Birth: 12-10-1955           MRN: [id number] Visit Date: 08/02/2022 Requested by: Ladell Pier, MD [place],  [place] [address] PCP: Ladell Pier, MD  Subjective: Chief Complaint  Patient presents with   Right Knee - Pain   Left Knee - Pain    HPI: Matthew Domine Sr. is a 67 y.o. male who presents to the office reporting bilateral knee pain.  Patient has history of bilateral knee osteoarthritis.  He states that he has had previous aspiration with injection that provided good relief for him.  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  Most of his pain is in the medial aspect of both knees.  Occasional has radiation to either ankle but he denies any numbness and tingling aside from in his feet.  No groin pain or radicular pain.  He has never had prior surgery to either knee.  In addition to the knee pain he has shoulder pain in the left shoulder and currently has another appointment to talk about this pain in the future..                ROS: All systems reviewed are negative as they relate to the chief complaint within the history of present illness.  Patient denies fevers or chills.  Assessment & Plan: Visit Diagnoses:  1. Osteoarthritis of knees (Bilateral)     Plan: Patient is a 67 year old male who presents for evaluation of bilateral knee pain.  Has history of bilateral knee osteoarthritis that is primarily localized to the medial compartment.  He would like to try knee injections today.  22 cc was aspirated from the left knee and 12 cc was aspirated from the right knee.  Cortisone injections administered without difficulty.  No complications.  Patient tolerated procedure well.  Plan for him to return to work on Wednesday to give time for injections to take  effect.  Will follow-up with the office as needed but his next appointment is in early to mid February for his left shoulder.  Follow-Up Instructions: No follow-ups on file.   Orders:  No orders of the defined types were placed in this encounter.  No orders of the defined types were placed in this encounter.     Procedures: Large Joint Inj: L glenohumeral: bilateral knee on 08/02/2022 2:40 PM Indications: diagnostic evaluation, joint swelling and pain Details: 18 G 1.5 in needle, superolateral approach  Arthrogram: No  Medications (Right): 5 mL lidocaine 1 %; 4 mL bupivacaine 0.25 %; 40 mg methylPREDNISolone acetate 40 MG/ML Aspirate (Right): 12 mL Medications (Left): 5 mL lidocaine 1 %; 4 mL bupivacaine 0.25 %; 40 mg methylPREDNISolone acetate 40 MG/ML Aspirate (Left): 22 mL Outcome: tolerated well, no immediate complications Procedure, treatment alternatives, risks and benefits explained, specific risks discussed. Consent was given by the patient. Immediately prior to procedure a time out was called to verify the correct patient, procedure, equipment, support staff and site/side marked as required. Patient was prepped and draped in the usual sterile fashion.       Clinical Data: No additional findings.  Objective: Vital Signs: There were no vitals taken for this visit.  Physical Exam:  Constitutional: Patient appears  well-developed HEENT:  Head: Normocephalic Eyes:EOM are normal Neck: Normal range of motion Cardiovascular: Normal rate Pulmonary/chest: Effort normal Neurologic: Patient is alert Skin: Skin is warm Psychiatric: Patient has normal mood and affect  Ortho Exam: Ortho exam demonstrates bilateral knees with trace effusion in the right knee and moderate effusion in the left knee.  3 degrees extension to 115 degrees knee flexion in the left.  5 degrees knee extension to 110 degrees of knee flexion in the right knee.  No cellulitis or skin changes noted.  No pain  with hip range of motion bilaterally.  Able to perform straight leg raise bilaterally.  Tenderness moderately over the medial joint line in both knees.  No tenderness over the lateral joint line in either knee.  Patellofemoral crepitus noted with passive motion of either knee.  Ambulates with some antalgia but is able to weight-bear.  Specialty Comments:  No specialty comments available.  Imaging: No results found.   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   DDD (degenerative disc disease), cervical 09/21/2017   DJD of acromioclavicular joint (Right) 09/21/2017   DJD of acromioclavicular joint (Bilateral) 09/21/2017   DJD of acromioclavicular joint (Left) 09/21/2017   Osteoarthritis of glenohumeral joint (Left) 09/21/2017   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/2017   Tricompartmental disease of knee (Bilateral) 09/21/2017   Osteoarthritis of knees (Bilateral) 09/21/2017   Suprapatellar effusion of knee (recurrent) (Right) 09/21/2017   Calcaneal spur of feet (Bilateral) 09/21/2017   Hypomagnesemia 09/20/2017   Low testosterone 09/20/2017   Chronic foot pain (Bilateral) 09/20/2017   Vitamin D deficiency 04/04/2017   Chronic shoulder pain (Primary Area of Pain) (Left) 03/30/2017   Chronic knee pain (Tertiary Area of Pain) (Left) 03/30/2017   Disorder of bone, unspecified 03/30/2017   Other reduced mobility 03/30/2017   Other specified health status 03/30/2017   Opioid use agreement exists 03/30/2017   Opiate use 03/30/2017   Chronic pain syndrome 03/30/2017   Chronic upper extremity pain (Secondary Area of Pain) (Bilateral) 03/30/2017   Elevated PSA 12/05/2015   Noncompliance  12/05/2015   Hypertension associated with diabetes ([place]) 03/14/2015   Type 2 diabetes mellitus with diabetic mononeuropathy ([place]) 03/14/2015   Encounter for health maintenance examination in adult 03/14/2015   Screening for prostate cancer 03/14/2015   Special screening for malignant neoplasms, colon 03/14/2015   Hyperlipidemia 03/14/2015   Nocturia 03/14/2015   Urinary frequency 03/14/2015   Need for prophylactic vaccination and inoculation against influenza 03/14/2015   Need for prophylactic vaccination against Streptococcus pneumoniae (pneumococcus) 03/14/2015   Need for Tdap vaccination 03/14/2015   Chronic nausea 03/14/2015   Gastroesophageal reflux disease without esophagitis 03/14/2015   Past Medical History:  Diagnosis Date   Allergy    Arthritis    DM (diabetes mellitus) ([place])    Foot pain, bilateral    referred to podiatry 06/2014   GERD (gastroesophageal reflux disease)    long history of   Hyperlipidemia    Hypertension    Polyarthralgia    shoulders, knees, ankles, wrists; neg rheum lab screen 04/2014   Prostate cancer ([place])    Type 2 diabetes mellitus with diabetic neuropathy ([place])    Wears glasses     Family History  Problem Relation  Age of Onset   Diabetes Mother    Hypertension Mother    Other Father        murdered   Diabetes Sister    Arthritis Sister    Heart disease Sister    Diabetes Sister    Diabetes Sister    Cancer Neg Hx    Stroke Neg Hx    Prostate cancer Neg Hx        unknown   Bladder Cancer Neg Hx        unkown   Kidney cancer Neg Hx        unkown    Past Surgical History:  Procedure Laterality Date   COLONOSCOPY     referral pending 03/2015   NO PAST SURGERIES     as of 03/2015   Social History   Occupational History   Occupation: switcher/ spotter/forklift  Tobacco Use   Smoking status: Some Days    Types: Cigarettes    Passive exposure: Current   Smokeless tobacco: Never  Vaping Use   Vaping Use: Never used   Substance and Sexual Activity   Alcohol use: Yes    Comment: occasional beer   Drug use: No   Sexual activity: Yes

## 2022-08-10 ENCOUNTER — Ambulatory Visit (INDEPENDENT_AMBULATORY_CARE_PROVIDER_SITE_OTHER): Payer: Medicare Other | Admitting: Urology

## 2022-08-10 DIAGNOSIS — C61 Malignant neoplasm of prostate: Secondary | ICD-10-CM

## 2022-08-10 DIAGNOSIS — N529 Male erectile dysfunction, unspecified: Secondary | ICD-10-CM | POA: Diagnosis not present

## 2022-08-10 MED ORDER — TAMSULOSIN HCL 0.4 MG PO CAPS: 0.4000 mg | ORAL_CAPSULE | Freq: Every day | ORAL | 11 refills | Status: DC

## 2022-08-10 MED ORDER — SILDENAFIL CITRATE 100 MG PO TABS: 100.0000 mg | ORAL_TABLET | Freq: Every day | ORAL | 11 refills | Status: DC | PRN

## 2022-08-10 NOTE — Progress Notes (Signed)
Virtual Visit via Telephone Note  I connected with Matthew Warriner Sr. on 08/10/22 at 11:15 AM EST by telephone and verified that I am speaking with the correct person using two identifiers.   Patient location: Home Provider location: [facility]   I discussed the limitations, risks, security and privacy concerns of performing an evaluation and management service by telephone and the availability of in person appointments. We discussed the impact of the COVID-19 pandemic on the healthcare system, and the importance of social distancing and reducing patient and provider exposure. I also discussed with the patient that there may be a patient responsible charge related to this service. The patient expressed understanding and agreed to proceed.  Reason for visit: Prostate cancer, urinary symptoms, ED  History of Present Illness: 67 year old male with elevated PSA of 17.6, and prostate MRI in September showed diffuse abnormal signal worrisome for possible diffuse prostate neoplasm.  He lives in [place] and has significant problems with transportation, and unfortunately the urology group in [place] does not take his insurance so he has been coming to [place] at [facility] for visits.  He no showed multiple prostate biopsy appointments, however after extensive conversations over the phone he ultimately underwent a prostate biopsy on 07/03/2021.   Prostate biopsy showed a 30 g prostate with extensive high-grade prostate cancer throughout.  All 12 cores were positive for prostate acinar adenocarcinoma, primarily grade group 5 disease, and max core involvement of 100%.  Prostate MRI showed possible extraprostatic extension beyond the capsule at the prostate base.  PSMA PET scan in February 2023 also suggested metastatic disease in the left pelvic lymph nodes and left lower retroperitoneal lymph nodes.   He ultimately opted for treatment with external beam radiation  and 2 years of ADT(through oncology in [place], initial injection February 2023, plan to continue ADT through February 2025), and radiation to the prostate and nodes was completed in May 2023 with Dr. Tammi Klippel in [place].  Initial post radiation PSA on 02/16/2022 was 0.3.    His oncologist Dr. Alen Blew in [place] recommended considering abiraterone and prednisone or enzalutamide, but patient deferred.  He has a visit with them later this month for PSA and continued ADT.  He has some persistent urinary symptoms after radiation with primarily weak stream.  This is improved significantly on the Flomax, but he notices when he stopped that medication the urinary symptoms recur.  I recommended the Flomax 0.4 mg nightly dose, and we discussed that it can take 1 to 2 years after radiation for urinary symptoms to completely resolve.  He also has ED that is responsive to Viagra 100 mg on demand.  Follow Up: Flomax and sildenafil refilled Continue oncology follow-up for ADT and PSA monitoring Virtual visit urology 1 year for symptom check ED/urinary symptoms   I discussed the assessment and treatment plan with the patient. The patient was provided an opportunity to ask questions and all were answered. The patient agreed with the plan and demonstrated an understanding of the instructions.   The patient was advised to call back or seek an in-person evaluation if the symptoms worsen or if the condition fails to improve as anticipated.  I provided 8 minutes of non-face-to-face time during this encounter.   Billey Co, MD

## 2022-08-14 ENCOUNTER — Other Ambulatory Visit: Payer: Self-pay | Admitting: Internal Medicine

## 2022-08-14 DIAGNOSIS — K219 Gastro-esophageal reflux disease without esophagitis: Secondary | ICD-10-CM

## 2022-08-16 ENCOUNTER — Ambulatory Visit: Payer: Medicare Other | Admitting: Surgical

## 2022-08-18 ENCOUNTER — Ambulatory Visit: Payer: Medicare Other | Admitting: Physician Assistant

## 2022-08-19 ENCOUNTER — Other Ambulatory Visit: Payer: Self-pay | Admitting: Internal Medicine

## 2022-08-19 DIAGNOSIS — E1142 Type 2 diabetes mellitus with diabetic polyneuropathy: Secondary | ICD-10-CM

## 2022-08-20 NOTE — Telephone Encounter (Signed)
Unable to refill per protocol, Rx request is too soon for metformin. Last refill 07/12/22 for 90 days. Patient needs OV for refills of meloxicam.  Requested Prescriptions  Pending Prescriptions Disp Refills   metFORMIN (GLUCOPHAGE-XR) 500 MG 24 hr tablet [Pharmacy Med Name: METFORMIN HCL ER 500 MG TABLET] 90 tablet 0    Sig: TAKE 1 TABLET BY MOUTH [place]     Endocrinology:  Diabetes - Biguanides Failed - 08/19/2022  9:00 PM      Failed - Cr in normal range and within 360 days    Creatinine  Date Value Ref Range Status  04/01/2022 1.70 (H) 0.61 - 1.24 mg/dL Final  01/21/2022 61.9 20.0 - 300.0 mg/dL Final   Creat  Date Value Ref Range Status  12/05/2015 1.18 0.70 - 1.33 mg/dL Final         Failed - HBA1C is between 0 and 7.9 and within 180 days    HbA1c, POC (controlled diabetic range)  Date Value Ref Range Status  01/21/2022 5.9 0.0 - 7.0 % Final         Failed - eGFR in normal range and within 360 days    GFR calc Af Amer  Date Value Ref Range Status  05/06/2018 >60 >60 mL/min Final    Comment:    (NOTE) The eGFR has been calculated using the CKD EPI equation. This calculation has not been validated in all clinical situations. eGFR's persistently <60 mL/min signify possible Chronic Kidney Disease.    GFR, Estimated  Date Value Ref Range Status  04/01/2022 44 (L) >60 mL/min Final    Comment:    (NOTE) Calculated using the CKD-EPI Creatinine Equation (2021)    eGFR  Date Value Ref Range Status  12/16/2020 69 >59 mL/min/1.73 Final         Failed - B12 Level in normal range and within 720 days    Vitamin B-12  Date Value Ref Range Status  03/30/2017 244 180 - 914 pg/mL Final    Comment:    (NOTE) This assay is not validated for testing neonatal or myeloproliferative syndrome specimens for Vitamin B12 levels. Performed at [facility], [place] [address] [place], [place] [address]          Failed - Valid encounter within last 6 months     Recent Outpatient Visits           7 months ago Type 2 diabetes mellitus with morbid obesity ([place])   [place] Karle Plumber B, MD   1 year ago Type 2 diabetes mellitus with peripheral neuropathy [facility])   [place] Ladell Pier, MD   1 year ago Need for influenza vaccination   [place], RPH-CPP   1 year ago Type 2 diabetes mellitus with peripheral neuropathy [facility])   [place] Ladell Pier, MD   1 year ago Hypertension associated with diabetes [facility])   [place], Brock, RPH-CPP       Future Appointments             In 2 weeks Thereasa Solo, Dionne Bucy, PA-C [place]   In 3 weeks Marlou Sa, Tonna Corner, MD [place]   In 12 months Diamantina Providence, Herbert Seta, MD [facility] [place]  Failed - CBC within normal limits and completed in the last 12 months    WBC  Date Value Ref Range Status  05/06/2018 10.9 (H) 4.0 - 10.5 K/uL Final   WBC Count  Date Value Ref Range Status  04/01/2022 7.4 4.0 - 10.5 K/uL Final   RBC  Date Value Ref Range Status  04/01/2022 3.44 (L) 4.22 - 5.81 MIL/uL Final   Hemoglobin  Date Value Ref Range Status  04/01/2022 10.4 (L) 13.0 - 17.0 g/dL Final   HCT  Date Value Ref Range Status  04/01/2022 31.3 (L) 39.0 - 52.0 % Final   MCHC  Date Value Ref Range Status  04/01/2022 33.2 30.0 - 36.0 g/dL Final   [facility]  Date Value Ref Range Status  04/01/2022 30.2 26.0 - 34.0 pg Final   MCV  Date Value Ref Range Status  04/01/2022 91.0 80.0 - 100.0 fL Final   No results found for: "PLTCOUNTKUC", "LABPLAT", "POCPLA" RDW  Date Value Ref Range Status  04/01/2022 14.5 11.5 - 15.5 % Final          meloxicam (MOBIC) 7.5 MG tablet [Pharmacy Med Name: MELOXICAM 7.5  MG TABLET] 30 tablet 0    Sig: TAKE 1 TABLET (7.5 MG TOTAL) BY MOUTH DAILY. PLEASE MAKE AN APPT     Analgesics:  COX2 Inhibitors Failed - 08/19/2022  9:00 PM      Failed - Manual Review: Labs are only required if the patient has taken medication for more than 8 weeks.      Failed - HGB in normal range and within 360 days    Hemoglobin  Date Value Ref Range Status  04/01/2022 10.4 (L) 13.0 - 17.0 g/dL Final         Failed - Cr in normal range and within 360 days    Creatinine  Date Value Ref Range Status  04/01/2022 1.70 (H) 0.61 - 1.24 mg/dL Final  01/21/2022 61.9 20.0 - 300.0 mg/dL Final   Creat  Date Value Ref Range Status  12/05/2015 1.18 0.70 - 1.33 mg/dL Final         Failed - HCT in normal range and within 360 days    HCT  Date Value Ref Range Status  04/01/2022 31.3 (L) 39.0 - 52.0 % Final         Passed - AST in normal range and within 360 days    AST  Date Value Ref Range Status  04/01/2022 15 15 - 41 U/L Final         Passed - ALT in normal range and within 360 days    ALT  Date Value Ref Range Status  04/01/2022 8 0 - 44 U/L Final         Passed - eGFR is 30 or above and within 360 days    GFR calc Af Amer  Date Value Ref Range Status  05/06/2018 >60 >60 mL/min Final    Comment:    (NOTE) The eGFR has been calculated using the CKD EPI equation. This calculation has not been validated in all clinical situations. eGFR's persistently <60 mL/min signify possible Chronic Kidney Disease.    GFR, Estimated  Date Value Ref Range Status  04/01/2022 44 (L) >60 mL/min Final    Comment:    (NOTE) Calculated using the CKD-EPI Creatinine Equation (2021)    eGFR  Date Value Ref Range Status  12/16/2020 69 >59 mL/min/1.73 Final         Passed - Patient is  not pregnant      Passed - Valid encounter within last 12 months    Recent Outpatient Visits           7 months ago Type 2 diabetes mellitus with morbid obesity ([place])   [place] Karle Plumber B, MD   1 year ago Type 2 diabetes mellitus with peripheral neuropathy [facility])   [place] Ladell Pier, MD   1 year ago Need for influenza vaccination   [place], RPH-CPP   1 year ago Type 2 diabetes mellitus with peripheral neuropathy [facility])   [place] Ladell Pier, MD   1 year ago Hypertension associated with diabetes [facility])   [place], [place]ak, RPH-CPP       Future Appointments             In 2 weeks Thereasa Solo, Dionne Bucy, PA-C [place]   In 3 weeks Marlou Sa, Tonna Corner, MD [place]   In 12 months Diamantina Providence, Herbert Seta, MD [facility] [place]

## 2022-08-26 ENCOUNTER — Encounter: Payer: Self-pay | Admitting: *Deleted

## 2022-08-26 ENCOUNTER — Inpatient Hospital Stay: Payer: Medicare Other | Admitting: Hematology and Oncology

## 2022-08-26 ENCOUNTER — Inpatient Hospital Stay: Payer: Medicare Other

## 2022-08-27 ENCOUNTER — Other Ambulatory Visit: Payer: Medicare Other

## 2022-08-27 ENCOUNTER — Telehealth: Payer: Self-pay | Admitting: Physician Assistant

## 2022-08-27 ENCOUNTER — Ambulatory Visit: Payer: Medicare Other | Admitting: Hematology and Oncology

## 2022-08-27 ENCOUNTER — Ambulatory Visit: Payer: Medicare Other

## 2022-08-27 NOTE — Telephone Encounter (Signed)
Patient called to reschedule 3/19 appointment to earlier date stating they missed one appointment already and wanted to see provider sooner. Patient rescheduled and notified.

## 2022-09-01 ENCOUNTER — Ambulatory Visit: Payer: Medicare Other | Admitting: Oncology

## 2022-09-01 ENCOUNTER — Ambulatory Visit: Payer: Medicare Other

## 2022-09-01 ENCOUNTER — Other Ambulatory Visit: Payer: Medicare Other

## 2022-09-06 ENCOUNTER — Other Ambulatory Visit: Payer: Self-pay | Admitting: Physician Assistant

## 2022-09-06 DIAGNOSIS — C61 Malignant neoplasm of prostate: Secondary | ICD-10-CM

## 2022-09-07 ENCOUNTER — Inpatient Hospital Stay: Payer: Medicare Other

## 2022-09-07 ENCOUNTER — Inpatient Hospital Stay (HOSPITAL_BASED_OUTPATIENT_CLINIC_OR_DEPARTMENT_OTHER): Payer: Medicare Other | Admitting: Physician Assistant

## 2022-09-07 ENCOUNTER — Other Ambulatory Visit: Payer: Self-pay

## 2022-09-07 ENCOUNTER — Inpatient Hospital Stay: Payer: Medicare Other | Attending: Physician Assistant

## 2022-09-07 VITALS — BP 125/72 | HR 81 | Temp 97.3°F | Resp 16 | Wt 295.7 lb

## 2022-09-07 DIAGNOSIS — C61 Malignant neoplasm of prostate: Secondary | ICD-10-CM | POA: Diagnosis not present

## 2022-09-07 DIAGNOSIS — D649 Anemia, unspecified: Secondary | ICD-10-CM | POA: Insufficient documentation

## 2022-09-07 DIAGNOSIS — C775 Secondary and unspecified malignant neoplasm of intrapelvic lymph nodes: Secondary | ICD-10-CM | POA: Diagnosis not present

## 2022-09-07 DIAGNOSIS — Z5111 Encounter for antineoplastic chemotherapy: Secondary | ICD-10-CM | POA: Insufficient documentation

## 2022-09-07 LAB — CBC WITH DIFFERENTIAL (CANCER CENTER ONLY)
Abs Immature Granulocytes: 0.03 10*3/uL (ref 0.00–0.07)
Basophils Absolute: 0 10*3/uL (ref 0.0–0.1)
Basophils Relative: 0 %
Eosinophils Absolute: 0.3 10*3/uL (ref 0.0–0.5)
Eosinophils Relative: 4 %
HCT: 31.1 % — ABNORMAL LOW (ref 39.0–52.0)
Hemoglobin: 10.4 g/dL — ABNORMAL LOW (ref 13.0–17.0)
Immature Granulocytes: 0 %
Lymphocytes Relative: 12 %
Lymphs Abs: 0.9 10*3/uL (ref 0.7–4.0)
MCH: 29.1 pg (ref 26.0–34.0)
MCHC: 33.4 g/dL (ref 30.0–36.0)
MCV: 86.9 fL (ref 80.0–100.0)
Monocytes Absolute: 0.5 10*3/uL (ref 0.1–1.0)
Monocytes Relative: 6 %
Neutro Abs: 5.6 10*3/uL (ref 1.7–7.7)
Neutrophils Relative %: 78 %
Platelet Count: 284 10*3/uL (ref 150–400)
RBC: 3.58 MIL/uL — ABNORMAL LOW (ref 4.22–5.81)
RDW: 15.1 % (ref 11.5–15.5)
WBC Count: 7.3 10*3/uL (ref 4.0–10.5)
nRBC: 0 % (ref 0.0–0.2)

## 2022-09-07 LAB — CMP (CANCER CENTER ONLY)
ALT: 7 U/L (ref 0–44)
AST: 15 U/L (ref 15–41)
Albumin: 3.7 g/dL (ref 3.5–5.0)
Alkaline Phosphatase: 80 U/L (ref 38–126)
Anion gap: 6 (ref 5–15)
BUN: 17 mg/dL (ref 8–23)
CO2: 26 mmol/L (ref 22–32)
Calcium: 9.1 mg/dL (ref 8.9–10.3)
Chloride: 107 mmol/L (ref 98–111)
Creatinine: 1.18 mg/dL (ref 0.61–1.24)
GFR, Estimated: >60 mL/min (ref >60)
Glucose, Bld: 157 mg/dL — ABNORMAL HIGH (ref 70–99)
Potassium: 4.3 mmol/L (ref 3.5–5.1)
Sodium: 139 mmol/L (ref 135–145)
Total Bilirubin: 0.3 mg/dL (ref 0.3–1.2)
Total Protein: 6.5 g/dL (ref 6.5–8.1)

## 2022-09-07 LAB — FOLATE: Folate: 5.4 ng/mL — ABNORMAL LOW (ref >5.9)

## 2022-09-07 LAB — RETIC PANEL
Immature Retic Fract: 14.5 % (ref 2.3–15.9)
RBC.: 3.51 MIL/uL — ABNORMAL LOW (ref 4.22–5.81)
Retic Count, Absolute: 60 10*3/uL (ref 19.0–186.0)
Retic Ct Pct: 1.7 % (ref 0.4–3.1)
Reticulocyte Hemoglobin: 31.4 pg (ref >27.9)

## 2022-09-07 LAB — IRON AND IRON BINDING CAPACITY (CC-WL,HP ONLY)
Iron: 40 ug/dL — ABNORMAL LOW (ref 45–182)
Saturation Ratios: 12 % — ABNORMAL LOW (ref 17.9–39.5)
TIBC: 347 ug/dL (ref 250–450)
UIBC: 307 ug/dL (ref 117–376)

## 2022-09-07 LAB — VITAMIN B12: Vitamin B-12: 243 pg/mL (ref 180–914)

## 2022-09-07 LAB — FERRITIN: Ferritin: 47 ng/mL (ref 24–336)

## 2022-09-07 MED ORDER — LEUPROLIDE ACETATE (6 MONTH) 45 MG ~~LOC~~ KIT
45.0000 mg | PACK | Freq: Once | SUBCUTANEOUS | Status: AC
  Administered 2022-09-07: 45 mg via SUBCUTANEOUS
  Filled 2022-09-07: qty 45

## 2022-09-07 NOTE — Progress Notes (Signed)
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   ONCOLOGIC HISTORY: Initially presented with elevated PSA dating back to at least 2016, at which time it was 9 and increased further to 9.6 in 2017. Found to have elevated PSA of 17.6 throught his PCP.  02/05/2021: Established care with urologist, Dr. Diamantina Providence 03/13/2021: MRI: Diffuse abnormal signal throughout the peripheral zone, commonly seen in setting of prostatitis, however there were areas that appeared to extend beyond capsule at the base (PI-RADS 3, with some concern for diffuse prostate neoplasm). There was indeterminate low signal extending to the seminal vesicles but no evidence of pelvic metastatic disease or bony lesions.   07/03/2021: Underwent transrectal ultrasound with 12 biopsies of the prostate on 07/03/21.  The prostate volume measured 31 cc.  Out of 12 core biopsies, all 12 were positive.  The maximum Gleason score was 5+5, and this was seen in the right lateral base and right lateral mid. Additionally, Gleason 5+4 was seen in the left lateral apex, left lateral mid, left lateral base, right base, left apex, left mid, and left base, Gleason 4+5 was seen in the right mid, and Gleason 3+4 in the right apex and right lateral apex. Per diagnostic comment, "the right apex and right lateral apex biopsies contain adenocarcinoma with mostly Gleason pattern 3, with discontinuous foci of pattern 4 and 5. This finding does not change the overall diagnosis  of extensive grade group 5 disease."  07/30/2021: Bone scan showed no evidence of osseous metastases.  08/11/2021: Started Eligard 45 mg injection every 6 months. 11/02/2021-12/28/2021: Received curative,definitive radiation to the prostate, seminal vesicles and pelvic lymph nodes initially to 45 Gy in 25 fractions of 1.8 Gy.The prostate and PET-positive nodes were boosted to 75 Gy with 15 additional fractions of 2.0 Gy    HISTORY OF PRESENTING ILLNESS:  Matthew Colon. 67 y.o. male returns for a follow up for prostate cancer with left pelvic lymphadenopathy. He was Seen by Dr. Alen Blew on 04/01/2022. In the interim, he has transferred care to Dr. Lorenso Courier. He is due for his next Eligard injection today.   On exam today, Mr. Spiker reports having stable energy levels with intermittent episodes of fatigue. He continues to complete all his ADLs on  his own. His appetite and weight are stable. He reports occasional episodes of nausea and constipation. He denies any vomiting episodes or abdominal pain. He denies easy bruising or signs of active bleeding. He denies fevers, chills, sweats, shortness of breath, chest pain, cough, urinary symptoms or edema. He has no other complaints.Rest of the 10 point ROS is below.   MEDICAL HISTORY:  Past Medical History:  Diagnosis Date   Allergy    Arthritis    DM (diabetes mellitus) ([place])    Foot pain, bilateral    referred to podiatry 06/2014   GERD (gastroesophageal reflux disease)    long history of   Hyperlipidemia    Hypertension    Polyarthralgia    shoulders, knees, ankles, wrists; neg rheum lab screen 04/2014   Prostate cancer ([place])    Type 2  diabetes mellitus with diabetic neuropathy (HCC)    Wears glasses     SURGICAL HISTORY: Past Surgical History:  Procedure Laterality Date   COLONOSCOPY     referral pending 03/2015   NO PAST SURGERIES     as of 03/2015    SOCIAL HISTORY: Social History   Socioeconomic History   Marital status:  Divorced    Spouse name: Not on file   Number of children: 3   Years of education: Not on file   Highest education level: Not on file  Occupational History   Occupation: switcher/ spotter/forklift  Tobacco Use   Smoking status: Some Days    Types: Cigarettes    Passive exposure: Current   Smokeless tobacco: Never  Vaping Use   Vaping Use: Never used  Substance and Sexual Activity   Alcohol use: Yes    Comment: occasional beer   Drug use: No   Sexual activity: Yes  Other Topics Concern   Not on file  Social History Narrative   Lives with his sister.   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   Exercise - active on the job.  Has 3 children in [place].   Social Determinants of Health   Financial Resource Strain: Not on file  Food Insecurity: Not on file  Transportation Needs: Not on file  Physical Activity: Not on file  Stress: Not on file  Social Connections: Not on file  Intimate Partner Violence: Not on file    FAMILY HISTORY: Family History  Problem Relation Age of Onset   Diabetes Mother    Hypertension Mother    Other Father        murdered   Diabetes Sister    Arthritis Sister    Heart disease Sister    Diabetes Sister    Diabetes Sister    Cancer Neg Hx    Stroke Neg Hx    Prostate cancer Neg Hx        unknown   Bladder Cancer Neg Hx        unkown   Kidney cancer Neg Hx        unkown    ALLERGIES:  has No Known Allergies.  MEDICATIONS:  Current Outpatient Medications  Medication Sig Dispense Refill   amoxicillin-clavulanate (AUGMENTIN) 875-125 MG tablet Take 1 tablet by mouth every 12 (twelve) hours. 14 tablet 0   atorvastatin (LIPITOR) 80 MG tablet Take 1 tablet (80 mg total) by mouth daily. 90 tablet 3   calcium carbonate (OS-CAL - DOSED IN MG OF ELEMENTAL CALCIUM) 1250 (500 Ca) MG tablet Take 1 tablet by mouth daily with breakfast.     cholecalciferol (VITAMIN D3) 25 MCG (1000 UNIT) tablet Take 1,000 Units by mouth daily.     fluticasone (FLONASE)  50 MCG/ACT nasal spray Place 1 spray into both nostrils daily. 16 g 0   gabapentin (NEURONTIN) 600 MG tablet      loratadine (CLARITIN) 10 MG tablet Take 1 tablet (10 mg total) by mouth daily. 30 tablet 1   meloxicam (MOBIC) 7.5 MG tablet Take 1 tablet (7.5 mg total) by mouth daily. Please make an appt 30 tablet 0   metFORMIN (GLUCOPHAGE-XR) 500 MG 24 hr tablet TAKE 1 TABLET BY MOUTH EVERY DAY WITH BREAKFAST 90 tablet 0   olmesartan (BENICAR) 20 MG tablet Take 1 tablet (20 mg total) by mouth daily. 30 tablet 6   oxyCODONE-acetaminophen (PERCOCET) 10-325 MG tablet Take 1 tablet by mouth 3 (three) times daily as needed  for pain.     pantoprazole (PROTONIX) 20 MG tablet TAKE 1 TABLET BY MOUTH EVERY DAY 30 tablet 0   polyethylene glycol (MIRALAX / GLYCOLAX) 17 g packet Take by mouth.     pregabalin (LYRICA) 75 MG capsule Take 1 capsule (75 mg total) by mouth 2 (two) times daily. 30 capsule 0   senna (SENOKOT) 8.6 MG tablet Take 1 tablet (8.6 mg total) by mouth daily as needed for constipation. 30 tablet 3   sildenafil (VIAGRA) 100 MG tablet Take 1 tablet (100 mg total) by mouth daily as needed for erectile dysfunction. 30 tablet 11   tamsulosin (FLOMAX) 0.4 MG CAPS capsule Take 1 capsule (0.4 mg total) by mouth daily after supper. 30 capsule 11   No current facility-administered medications for this visit.    REVIEW OF SYSTEMS:   Constitutional: ( - ) fevers, ( - )  chills , ( - ) night sweats Eyes: ( - ) blurriness of vision, ( - ) double vision, ( - ) watery eyes Ears, nose, mouth, throat, and face: ( - ) mucositis, ( - ) sore throat Respiratory: ( - ) cough, ( - ) dyspnea, ( - ) wheezes Cardiovascular: ( - ) palpitation, ( - ) chest discomfort, ( - ) lower extremity swelling Gastrointestinal:  ( - ) nausea, ( - ) heartburn, ( - ) change in bowel habits Skin: ( - ) abnormal skin rashes Lymphatics: ( - ) new lymphadenopathy, ( - ) easy bruising Neurological: ( - ) numbness, ( - ) tingling, ( -  ) new weaknesses Behavioral/Psych: ( - ) mood change, ( - ) new changes  All other systems were reviewed with the patient and are negative.  PHYSICAL EXAMINATION: ECOG PERFORMANCE STATUS: 1 - Symptomatic but completely ambulatory  Vitals:   09/07/22 1028  BP: 125/72  Pulse: 81  Resp: 16  Temp: (!) 97.3 F (36.3 C)  SpO2: 99%   Filed Weights   09/07/22 1028  Weight: 295 lb 11.2 oz (134.1 kg)    GENERAL: well appearing male in NAD  SKIN: skin color, texture, turgor are normal, no rashes or significant lesions EYES: conjunctiva are pink and non-injected, sclera clear LYMPH:  no palpable lymphadenopathy in the cervical or supraclavicular lymph nodes.  LUNGS: clear to auscultation and percussion with normal breathing effort HEART: regular rate & rhythm and no murmurs and no lower extremity edema Musculoskeletal: no cyanosis of digits and no clubbing  PSYCH: alert & oriented x 3, fluent speech NEURO: no focal motor/sensory deficits  LABORATORY DATA:  I have reviewed the data as listed    Latest Ref Rng & Units 09/07/2022   10:04 AM 04/01/2022    3:25 PM 05/06/2018    8:05 AM  CBC  WBC 4.0 - 10.5 K/uL 7.3  7.4  10.9   Hemoglobin 13.0 - 17.0 g/dL 10.4  10.4  11.5   Hematocrit 39.0 - 52.0 % 31.1  31.3  35.2   Platelets 150 - 400 K/uL 284  300  348        Latest Ref Rng & Units 09/07/2022   10:04 AM 04/01/2022    3:25 PM 12/16/2020    9:13 AM  CMP  Glucose 70 - 99 mg/dL 157  110  86   BUN 8 - 23 mg/dL '17  24  11   '$ Creatinine 0.61 - 1.24 mg/dL 1.18  1.70  1.19   Sodium 135 - 145 mmol/L 139  141  141  Potassium 3.5 - 5.1 mmol/L 4.3  4.6  4.4   Chloride 98 - 111 mmol/L 107  106  103   CO2 22 - 32 mmol/L '26  26  21   '$ Calcium 8.9 - 10.3 mg/dL 9.1  9.4  9.8   Total Protein 6.5 - 8.1 g/dL 6.5  6.9  6.8   Total Bilirubin 0.3 - 1.2 mg/dL 0.3  0.4  0.4   Alkaline Phos 38 - 126 U/L 80  73  93   AST 15 - 41 U/L '15  15  17   '$ ALT 0 - 44 U/L '7  8  7     '$ ASSESSMENT & PLAN Matthew Colon. Is a 67 y.o. male who presents to the clinic for a follow up visit for advanced prostate cancer.   # Castration-sensitive advanced prostate cancer with limited pelvic adenopathy  --Started Eligard 45 injections q 6 months on 08/11/2021 --Received curative, definitive radiation to the prostate, seminal vesicles and pelvic lymph nodes from 11/02/2021-12/28/2021. --Patient is not interested in therapy escalation with abiraterone and prednisone or enzalutamide. He prefers to  continue with androgen deprivation therapy alone. --Under the care of Dr. Diamantina Providence, last seen on 08/10/2022.  PLAN: --Due for another Eligard injection today. --Labs  today show persistent anemia with Hgb 10.4, MCV 86.9. No other cytopenias and creatinine/LFTs normal.  PSA level pending.  --Proceed with treatment today and return in 6 months for a follow up visit with Dr. Lorenso Courier before next Eligard injection.   #Normocytic Anemia: --Etiology unknown --Labs today to check iron, B12, MMA, folate, SPEP/IFE, sFLC, retic panel, eryhtropoietin level --Patient denies any bleeding episodes.   Orders Placed This Encounter  Procedures   Erythropoietin    Standing Status:   Future    Number of Occurrences:   1    Standing Expiration Date:   09/07/2023   Ferritin    Standing Status:   Future    Number of Occurrences:   1    Standing Expiration Date:   09/07/2023   Vitamin B12    Standing Status:   Future    Number of Occurrences:   1    Standing Expiration Date:   09/07/2023   Methylmalonic acid, serum    Standing Status:   Future    Number of Occurrences:   1    Standing Expiration Date:   09/07/2023   Folate, Serum    Standing Status:   Future    Number of Occurrences:   1    Standing Expiration Date:   09/07/2023   Multiple Myeloma Panel (SPEP&IFE w/QIG)    Standing Status:   Future    Number of Occurrences:   1    Standing Expiration Date:   09/07/2023   Kappa/lambda light chains    Standing Status:   Future     Number of Occurrences:   1    Standing Expiration Date:   09/07/2023   Iron and Iron Binding Capacity ([facility] only)    Standing Status:   Future    Number of Occurrences:   1    Standing Expiration Date:   09/07/2023   Retic Panel    Standing Status:   Future    Number of Occurrences:   1    Standing Expiration Date:   09/07/2023    All questions were answered. The patient knows to call the clinic with any problems, questions or concerns.  I have spent a total of 30 minutes  minutes of face-to-face and non-face-to-face time, preparing to see the patient,  performing a medically appropriate examination, counseling and educating the patient, ordering tests/procedures,  documenting clinical information in the electronic health record,and care coordination.   Dede Query, PA-C Department of Hematology/Oncology [place] at [facility] Phone: [phone]

## 2022-09-07 NOTE — Patient Instructions (Signed)
Leuprolide Suspension for Injection (Prostate Cancer) What is this medication? LEUPROLIDE (loo PROE lide) reduces the symptoms of prostate cancer. It works by decreasing levels of the hormone testosterone in the body. This prevents prostate cancer cells from spreading or growing. This medicine may be used for other purposes; ask your health care provider or pharmacist if you have questions. COMMON BRAND NAME(S): Eligard, Lupron Depot, Lupron Depot-Ped, Lutrate Depot, Viadur What should I tell my care team before I take this medication? They need to know if you have any of these conditions: Diabetes Heart disease Heart failure High or low levels of electrolytes, such as magnesium, potassium, or sodium in your blood Irregular heartbeat or rhythm Seizures An unusual or allergic reaction to leuprolide, other medications, foods, dyes, or preservatives Pregnant or trying to get pregnant Breast-feeding How should I use this medication? This medication is injected under the skin or into a muscle. It is given by your care team in a hospital or clinic setting. Talk to your care team about the use of this medication in children. Special care may be needed. Overdosage: If you think you have taken too much of this medicine contact a poison control center or emergency room at once. NOTE: This medicine is only for you. Do not share this medicine with others. What if I miss a dose? Keep appointments for follow-up doses. It is important not to miss your dose. Call your care team if you are unable to keep an appointment. What may interact with this medication? Do not take this medication with any of the following: Cisapride Dronedarone Ketoconazole Levoketoconazole Pimozide Thioridazine This medication may also interact with the following: Other medications that cause heart rhythm changes This list may not describe all possible interactions. Give your health care provider a list of all the medicines,  herbs, non-prescription drugs, or dietary supplements you use. Also tell them if you smoke, drink alcohol, or use illegal drugs. Some items may interact with your medicine. What should I watch for while using this medication? Visit your care team for regular checks on your progress. Tell your care team if your symptoms do not start to get better or if they get worse. This medication may increase blood sugar. The risk may be higher in patients who already have diabetes. Ask your care team what you can do to lower the risk of diabetes while taking this medication. This medication may cause infertility. Talk to your care team if you are concerned about your fertility. Heart attacks and strokes have been reported with the use of this medication. Get emergency help if you develop signs or symptoms of a heart attack or stroke. Talk to your care team about the risks and benefits of this medication. What side effects may I notice from receiving this medication? Side effects that you should report to your care team as soon as possible: Allergic reactions--skin rash, itching, hives, swelling of the face, lips, tongue, or throat Heart attack--pain or tightness in the chest, shoulders, arms, or jaw, nausea, shortness of breath, cold or clammy skin, feeling faint or lightheaded Heart rhythm changes--fast or irregular heartbeat, dizziness, feeling faint or lightheaded, chest pain, trouble breathing High blood sugar (hyperglycemia)--increased thirst or amount of urine, unusual weakness or fatigue, blurry vision Mood swings, irritability, hostility Seizures Stroke--sudden numbness or weakness of the face, arm, or leg, trouble speaking, confusion, trouble walking, loss of balance or coordination, dizziness, severe headache, change in vision Thoughts of suicide or self-harm, worsening mood, feelings of depression Side  effects that usually do not require medical attention (report to your care team if they continue or  are bothersome): Bone pain Change in sex drive or performance General discomfort and fatigue Hot flashes Muscle pain Pain, redness, or irritation at injection site Swelling of the ankles, hands, or feet This list may not describe all possible side effects. Call your doctor for medical advice about side effects. You may report side effects to FDA at 1-800-FDA-1088. Where should I keep my medication? This medication is given in a hospital or clinic. It will not be stored at home. NOTE: This sheet is a summary. It may not cover all possible information. If you have questions about this medicine, talk to your doctor, pharmacist, or health care provider.  2023 Elsevier/Gold Standard (2021-08-31 00:00:00)

## 2022-09-08 LAB — ERYTHROPOIETIN: Erythropoietin: 35.6 m[IU]/mL — ABNORMAL HIGH (ref 2.6–18.5)

## 2022-09-08 LAB — KAPPA/LAMBDA LIGHT CHAINS
Kappa free light chain: 32.5 mg/L — ABNORMAL HIGH (ref 3.3–19.4)
Kappa, lambda light chain ratio: 1.92 — ABNORMAL HIGH (ref 0.26–1.65)
Lambda free light chains: 16.9 mg/L (ref 5.7–26.3)

## 2022-09-09 ENCOUNTER — Other Ambulatory Visit: Payer: Self-pay | Admitting: Internal Medicine

## 2022-09-09 ENCOUNTER — Ambulatory Visit: Payer: Medicare Other | Admitting: Physician Assistant

## 2022-09-09 DIAGNOSIS — E1142 Type 2 diabetes mellitus with diabetic polyneuropathy: Secondary | ICD-10-CM

## 2022-09-09 DIAGNOSIS — E1159 Type 2 diabetes mellitus with other circulatory complications: Secondary | ICD-10-CM

## 2022-09-09 DIAGNOSIS — K219 Gastro-esophageal reflux disease without esophagitis: Secondary | ICD-10-CM

## 2022-09-09 LAB — PROSTATE-SPECIFIC AG, SERUM (LABCORP): Prostate Specific Ag, Serum: 0.1 ng/mL (ref 0.0–4.0)

## 2022-09-13 LAB — METHYLMALONIC ACID, SERUM: Methylmalonic Acid, Quantitative: 254 nmol/L (ref 0–378)

## 2022-09-14 LAB — MULTIPLE MYELOMA PANEL, SERUM
Albumin SerPl Elph-Mcnc: 3.1 g/dL (ref 2.9–4.4)
Albumin/Glob SerPl: 1.2 (ref 0.7–1.7)
Alpha 1: 0.3 g/dL (ref 0.0–0.4)
Alpha2 Glob SerPl Elph-Mcnc: 0.6 g/dL (ref 0.4–1.0)
B-Globulin SerPl Elph-Mcnc: 0.9 g/dL (ref 0.7–1.3)
Gamma Glob SerPl Elph-Mcnc: 0.8 g/dL (ref 0.4–1.8)
Globulin, Total: 2.6 g/dL (ref 2.2–3.9)
IgA: 240 mg/dL (ref 61–437)
IgG (Immunoglobin G), Serum: 801 mg/dL (ref 603–1613)
IgM (Immunoglobulin M), Srm: 59 mg/dL (ref 20–172)
Total Protein ELP: 5.7 g/dL — ABNORMAL LOW (ref 6.0–8.5)

## 2022-09-15 ENCOUNTER — Ambulatory Visit: Payer: Medicare Other | Admitting: Orthopedic Surgery

## 2022-09-20 ENCOUNTER — Other Ambulatory Visit: Payer: Self-pay | Admitting: Physician Assistant

## 2022-09-20 ENCOUNTER — Telehealth: Payer: Self-pay

## 2022-09-20 DIAGNOSIS — D509 Iron deficiency anemia, unspecified: Secondary | ICD-10-CM

## 2022-09-20 MED ORDER — FERROUS SULFATE 325 (65 FE) MG PO TBEC: 325.0000 mg | DELAYED_RELEASE_TABLET | Freq: Every day | ORAL | 6 refills | Status: AC

## 2022-09-20 MED ORDER — FOLIC ACID 1 MG PO TABS: 1.0000 mg | ORAL_TABLET | Freq: Every day | ORAL | 6 refills | Status: DC

## 2022-09-20 NOTE — Telephone Encounter (Signed)
-----   Message from Lincoln Brigham, PA-C sent at 09/20/2022 11:20 AM EDT ----- Please notify patient that he has iron and folate deficiency. Sent prescription for iron pills and folic acid pills. I sent referral to GI to evaluate underlying cause of iron deficiency.

## 2022-09-20 NOTE — Telephone Encounter (Signed)
Pt advised and agreed to this plan

## 2022-09-21 ENCOUNTER — Ambulatory Visit: Payer: Medicare Other | Admitting: Physician Assistant

## 2022-09-21 ENCOUNTER — Inpatient Hospital Stay: Payer: Medicare Other

## 2022-09-21 ENCOUNTER — Other Ambulatory Visit: Payer: Medicare Other

## 2022-09-29 ENCOUNTER — Ambulatory Visit: Payer: Medicare Other | Admitting: Physician Assistant

## 2022-10-08 ENCOUNTER — Other Ambulatory Visit: Payer: Self-pay | Admitting: Internal Medicine

## 2022-10-08 DIAGNOSIS — K219 Gastro-esophageal reflux disease without esophagitis: Secondary | ICD-10-CM

## 2022-10-08 NOTE — Telephone Encounter (Signed)
Requested Prescriptions  Pending Prescriptions Disp Refills   pantoprazole (PROTONIX) 20 MG tablet [Pharmacy Med Name: PANTOPRAZOLE SOD DR 20 MG TAB] 90 tablet 1    Sig: TAKE 1 TABLET BY MOUTH EVERY DAY     Gastroenterology: Proton Pump Inhibitors Passed - 10/08/2022  2:30 PM      Passed - Valid encounter within last 12 months    Recent Outpatient Visits           8 months ago Type 2 diabetes mellitus with morbid obesity (HCC)   [place] [facility] Jonah Blue B, MD   1 year ago Type 2 diabetes mellitus with peripheral neuropathy [facility])   [place] [facility] Marcine Matar, MD   1 year ago Need for influenza vaccination   [facility] [place], [place] L, RPH-CPP   1 year ago Type 2 diabetes mellitus with peripheral neuropathy [facility])   [place] [facility] Marcine Matar, MD   1 year ago Hypertension associated with diabetes [facility])   [place] [facility] Lois Huxley, Cornelius Moras, RPH-CPP       Future Appointments             In 10 months Richardo Hanks, Laurette Schimke, MD [facility] [place]

## 2022-10-13 ENCOUNTER — Other Ambulatory Visit: Payer: Self-pay | Admitting: Internal Medicine

## 2022-10-13 NOTE — Telephone Encounter (Signed)
Requested medication (s) are due for refill today: yes  Requested medication (s) are on the active medication list: yes  Last refill:  09/10/22 #30 0 refills  Future visit scheduled: no  Notes to clinic:  no refills remain. Do you want to refill Rx?     Requested Prescriptions  Pending Prescriptions Disp Refills   meloxicam (MOBIC) 7.5 MG tablet [Pharmacy Med Name: MELOXICAM 7.5 MG TABLET] 30 tablet 0    Sig: TAKE 1 TABLET (7.5 MG TOTAL) BY MOUTH DAILY. PLEASE MAKE AN APPT     Analgesics:  COX2 Inhibitors Failed - 10/13/2022  2:07 AM      Failed - Manual Review: Labs are only required if the patient has taken medication for more than 8 weeks.      Failed - HGB in normal range and within 360 days    Hemoglobin  Date Value Ref Range Status  09/07/2022 10.4 (L) 13.0 - 17.0 g/dL Final         Failed - HCT in normal range and within 360 days    HCT  Date Value Ref Range Status  09/07/2022 31.1 (L) 39.0 - 52.0 % Final         Passed - Cr in normal range and within 360 days    Creatinine  Date Value Ref Range Status  09/07/2022 1.18 0.61 - 1.24 mg/dL Final  83/66/2947 65.4 20.0 - 300.0 mg/dL Final   Creat  Date Value Ref Range Status  12/05/2015 1.18 0.70 - 1.33 mg/dL Final         Passed - AST in normal range and within 360 days    AST  Date Value Ref Range Status  09/07/2022 15 15 - 41 U/L Final         Passed - ALT in normal range and within 360 days    ALT  Date Value Ref Range Status  09/07/2022 7 0 - 44 U/L Final         Passed - eGFR is 30 or above and within 360 days    GFR calc Af Amer  Date Value Ref Range Status  05/06/2018 >60 >60 mL/min Final    Comment:    (NOTE) The eGFR has been calculated using the CKD EPI equation. This calculation has not been validated in all clinical situations. eGFR's persistently <60 mL/min signify possible Chronic Kidney Disease.    GFR, Estimated  Date Value Ref Range Status  09/07/2022 >60 >60 mL/min Final     Comment:    (NOTE) Calculated using the CKD-EPI Creatinine Equation (2021)    eGFR  Date Value Ref Range Status  12/16/2020 69 >59 mL/min/1.73 Final         Passed - Patient is not pregnant      Passed - Valid encounter within last 12 months    Recent Outpatient Visits           8 months ago Type 2 diabetes mellitus with morbid obesity (HCC)   [place] [facility] Jonah Blue B, MD   1 year ago Type 2 diabetes mellitus with peripheral neuropathy [facility])   [place] [facility] Marcine Matar, MD   1 year ago Need for influenza vaccination   [facility] [place], [place] L, RPH-CPP   1 year ago Type 2 diabetes mellitus with peripheral neuropathy [facility])   [place] [facility] Marcine Matar, MD  1 year ago Hypertension associated with diabetes [facility](HCC)   [place] [facility] [place], Cornelius MorasStephen L, RPH-CPP       Future Appointments             In 10 months Richardo HanksSninsky, Laurette SchimkeBrian C, MD [facility] [place]

## 2022-10-15 ENCOUNTER — Ambulatory Visit (HOSPITAL_COMMUNITY)
Admission: EM | Admit: 2022-10-15 | Discharge: 2022-10-15 | Disposition: A | Discharge status: Home / Self Care | Payer: Medicare Other | Attending: Emergency Medicine | Admitting: Emergency Medicine

## 2022-10-15 ENCOUNTER — Encounter (HOSPITAL_COMMUNITY): Payer: Self-pay

## 2022-10-15 DIAGNOSIS — J019 Acute sinusitis, unspecified: Secondary | ICD-10-CM | POA: Diagnosis not present

## 2022-10-15 DIAGNOSIS — J302 Other seasonal allergic rhinitis: Secondary | ICD-10-CM

## 2022-10-15 DIAGNOSIS — B9789 Other viral agents as the cause of diseases classified elsewhere: Secondary | ICD-10-CM

## 2022-10-15 MED ORDER — GUAIFENESIN ER 600 MG PO TB12: 1200.0000 mg | ORAL_TABLET | Freq: Two times a day (BID) | ORAL | 0 refills | Status: AC

## 2022-10-15 MED ORDER — CETIRIZINE HCL 10 MG PO TABS: 10.0000 mg | ORAL_TABLET | Freq: Every day | ORAL | 2 refills | Status: DC

## 2022-10-15 MED ORDER — FLUTICASONE PROPIONATE 50 MCG/ACT NA SUSP: 2.0000 | Freq: Every day | NASAL | 2 refills | Status: DC

## 2022-10-15 NOTE — ED Triage Notes (Signed)
Pt c/o runny nose, watery eyes, and facial pressure since Wednesday. States took OTC meds with no relief. States needs a work note.

## 2022-10-15 NOTE — Discharge Instructions (Addendum)
I recommend daily zyrtec with daily flonase. This should decrease nasal congestion and pressure. It can also help with post-nasal drip.  The mucinex (guaifenesin) is a great medicine to thin congestion and get it out of the sinuses.  It works best if you are very hydrated! Please make sure you are drinking at least 64 ounces of water daily  You can also try humidifier or breathing in warm steamy shower.  Try these for the next 4 to 5 days. You can return if symptoms are worsening.

## 2022-10-15 NOTE — ED Provider Notes (Signed)
[facility]    CSN: [id number] Arrival date & time: 10/15/22  1207     History   Chief Complaint Chief Complaint  Patient presents with   Nasal Congestion    HPI Matthew Hogrefe Sr. is a 67 y.o. male.  2-day history of runny nose, watery eyes, nasal congestion, some sinus pressure Denies eye redness or discharge. A little itchy but no pain. No fever or chills He has tried OTC allergy med, name unknown  His work is requiting a note to return   Past Medical History:  Diagnosis Date   Allergy    Arthritis    DM (diabetes mellitus)    Foot pain, bilateral    referred to podiatry 06/2014   GERD (gastroesophageal reflux disease)    long history of   Hyperlipidemia    Hypertension    Polyarthralgia    shoulders, knees, ankles, wrists; neg rheum lab screen 04/2014   Prostate cancer    Type 2 diabetes mellitus with diabetic neuropathy    Wears glasses     Patient Active Problem List   Diagnosis Date Noted   Prostate cancer 02/25/2022   Drug-induced constipation 07/21/2021   Malignant neoplasm of prostate 07/21/2021   Hyperlipidemia associated with type 2 diabetes mellitus 11/13/2020   Morbid obesity 11/13/2020   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   DDD (degenerative disc disease), cervical 09/21/2017   DJD of acromioclavicular joint (Right) 09/21/2017   DJD of acromioclavicular joint (Bilateral) 09/21/2017   DJD of acromioclavicular joint (Left) 09/21/2017   Osteoarthritis of glenohumeral joint (Left) 09/21/2017   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/2017   Tricompartmental disease of knee (Bilateral) 09/21/2017   Osteoarthritis of knees (Bilateral) 09/21/2017   Suprapatellar effusion of knee (recurrent) (Right) 09/21/2017   Calcaneal spur of feet (Bilateral) 09/21/2017   Hypomagnesemia 09/20/2017   Low testosterone 09/20/2017   Chronic foot pain (Bilateral) 09/20/2017   Vitamin D deficiency 04/04/2017   Chronic shoulder pain (Primary Area of  Pain) (Left) 03/30/2017   Chronic knee pain (Tertiary Area of Pain) (Left) 03/30/2017   Disorder of bone, unspecified 03/30/2017   Other reduced mobility 03/30/2017   Other specified health status 03/30/2017   Opioid use agreement exists 03/30/2017   Opiate use 03/30/2017   Chronic pain syndrome 03/30/2017   Chronic upper extremity pain (Secondary Area of Pain) (Bilateral) 03/30/2017   Elevated PSA 12/05/2015   Noncompliance 12/05/2015   Hypertension associated with diabetes 03/14/2015   Type 2 diabetes mellitus with diabetic mononeuropathy 03/14/2015   Encounter for health maintenance examination in adult 03/14/2015   Screening for prostate cancer 03/14/2015   Special screening for malignant neoplasms, colon 03/14/2015   Hyperlipidemia 03/14/2015   Nocturia 03/14/2015   Urinary frequency 03/14/2015   Need for prophylactic vaccination and inoculation against influenza 03/14/2015   Need for prophylactic vaccination against Streptococcus pneumoniae (pneumococcus) 03/14/2015   Need for Tdap vaccination 03/14/2015   Chronic nausea 03/14/2015   Gastroesophageal reflux disease without esophagitis 03/14/2015    Past Surgical History:  Procedure Laterality Date   COLONOSCOPY     referral pending 03/2015   NO PAST SURGERIES     as of 03/2015     Home Medications    Prior to Admission medications   Medication Sig Start Date End Date Taking? Authorizing Provider  cetirizine (ZYRTEC ALLERGY) 10 MG tablet Take 1 tablet (10 mg total) by mouth daily. 10/15/22  Yes Cordarro Spinnato, Lurena Joiner, PA-C  fluticasone (FLONASE) 50 MCG/ACT nasal spray Place 2 sprays into both  nostrils daily. 10/15/22  Yes Mylinh Cragg, Lurena Joiner, PA-C  guaiFENesin (MUCINEX) 600 MG 12 hr tablet Take 2 tablets (1,200 mg total) by mouth 2 (two) times daily for 5 days. 10/15/22 10/20/22 Yes Gaje Tennyson, Lurena Joiner, PA-C  atorvastatin (LIPITOR) 80 MG tablet Take 1 tablet (80 mg total) by mouth daily. 11/13/20   Marcine Matar, MD  calcium carbonate  (OS-CAL - DOSED IN MG OF ELEMENTAL CALCIUM) 1250 (500 Ca) MG tablet Take 1 tablet by mouth daily with breakfast.    [provider]  cholecalciferol (VITAMIN D3) 25 MCG (1000 UNIT) tablet Take 1,000 Units by mouth daily.    [provider]  ferrous sulfate 325 (65 FE) MG EC tablet Take 1 tablet (325 mg total) by mouth daily with breakfast. 09/20/22   Briant Cedar, PA-C  folic acid (FOLVITE) 1 MG tablet Take 1 tablet (1 mg total) by mouth daily. 09/20/22   Briant Cedar, PA-C  gabapentin (NEURONTIN) 600 MG tablet  09/17/20   [provider]  meloxicam (MOBIC) 7.5 MG tablet TAKE 1 TABLET (7.5 MG TOTAL) BY MOUTH DAILY. PLEASE MAKE AN APPT 09/10/22   Marcine Matar, MD  metFORMIN (GLUCOPHAGE-XR) 500 MG 24 hr tablet TAKE 1 TABLET BY MOUTH EVERY DAY WITH BREAKFAST 09/10/22   Marcine Matar, MD  olmesartan (BENICAR) 20 MG tablet TAKE 1 TABLET BY MOUTH EVERY DAY 09/10/22   Marcine Matar, MD  pantoprazole (PROTONIX) 20 MG tablet TAKE 1 TABLET BY MOUTH EVERY DAY 10/08/22   Marcine Matar, MD  polyethylene glycol (MIRALAX / GLYCOLAX) 17 g packet Take by mouth. 07/31/20   [provider]  pregabalin (LYRICA) 75 MG capsule Take 1 capsule (75 mg total) by mouth 2 (two) times daily. 09/28/21   Felecia Shelling, DPM  senna (SENOKOT) 8.6 MG tablet Take 1 tablet (8.6 mg total) by mouth daily as needed for constipation. 07/21/21   Marcine Matar, MD  sildenafil (VIAGRA) 100 MG tablet Take 1 tablet (100 mg total) by mouth daily as needed for erectile dysfunction. 08/10/22   Sondra Come, MD  tamsulosin (FLOMAX) 0.4 MG CAPS capsule Take 1 capsule (0.4 mg total) by mouth daily after supper. 08/10/22   Sondra Come, MD    Family History Family History  Problem Relation Age of Onset   Diabetes Mother    Hypertension Mother    Other Father        murdered   Diabetes Sister    Arthritis Sister    Heart disease Sister    Diabetes Sister    Diabetes Sister    Cancer  Neg Hx    Stroke Neg Hx    Prostate cancer Neg Hx        unknown   Bladder Cancer Neg Hx        unkown   Kidney cancer Neg Hx        unkown    Social History Social History   Tobacco Use   Smoking status: Some Days    Types: Cigarettes    Passive exposure: Current   Smokeless tobacco: Never  Vaping Use   Vaping Use: Never used  Substance Use Topics   Alcohol use: Yes    Comment: occasional beer   Drug use: No     Allergies   Patient has no known allergies.   Review of Systems Review of Systems As per HPI  Physical Exam Triage Vital Signs ED Triage Vitals  Enc Vitals Group  BP 10/15/22 1327 (!) 156/80     Pulse Rate 10/15/22 1327 81     Resp 10/15/22 1327 18     Temp 10/15/22 1327 99.3 F (37.4 C)     Temp Source 10/15/22 1327 Oral     SpO2 10/15/22 1327 94 %     Weight --      Height --      Head Circumference --      Peak Flow --      Pain Score 10/15/22 1328 7     Pain Loc --      Pain Edu? --      Excl. in GC? --    No data found.  Updated Vital Signs BP (!) 156/80 (BP Location: Left Arm)   Pulse 81   Temp 99.3 F (37.4 C) (Oral)   Resp 18   SpO2 94%    Physical Exam Vitals and nursing note reviewed.  Constitutional:      General: He is not in acute distress. HENT:     Right Ear: Tympanic membrane and ear canal normal.     Left Ear: Tympanic membrane and ear canal normal.     Nose: No rhinorrhea.     Right Turbinates: Not swollen.     Left Turbinates: Not swollen.     Right Sinus: No frontal sinus tenderness.     Left Sinus: No frontal sinus tenderness.     Mouth/Throat:     Mouth: Mucous membranes are moist.     Pharynx: Oropharynx is clear. No posterior oropharyngeal erythema.  Eyes:     Conjunctiva/sclera: Conjunctivae normal.  Cardiovascular:     Rate and Rhythm: Normal rate and regular rhythm.     Pulses: Normal pulses.     Heart sounds: Normal heart sounds.  Pulmonary:     Effort: Pulmonary effort is normal.      Breath sounds: Normal breath sounds.  Musculoskeletal:     Cervical back: Normal range of motion.  Lymphadenopathy:     Cervical: No cervical adenopathy.  Skin:    General: Skin is warm and dry.  Neurological:     Mental Status: He is alert and oriented to person, place, and time.     UC Treatments / Results  Labs (all labs ordered are listed, but only abnormal results are displayed) Labs Reviewed - No data to display  EKG   Radiology No results found.  Procedures Procedures (including critical care time)  Medications Ordered in UC Medications - No data to display  Initial Impression / Assessment and Plan / UC Course  I have reviewed the triage vital signs and the nursing notes.  Pertinent labs & imaging results that were available during my care of the patient were reviewed by me and considered in my medical decision making (see chart for details).  Afebrile, well-appearing Discussed likely viral etiology of symptoms, allergies Will start once daily Zyrtec and Flonase.  Recommend Mucinex BID, increasing fluids.  Other symptomatic care at home.  Will attempt these therapies for the next several days, can return if symptoms are worsening.  Patient agreeable to plan  Final Clinical Impressions(s) / UC Diagnoses   Final diagnoses:  Acute viral sinusitis  Seasonal allergies     Discharge Instructions      I recommend daily zyrtec with daily flonase. This should decrease nasal congestion and pressure. It can also help with post-nasal drip.  The mucinex (guaifenesin) is a great medicine to thin congestion and get it  out of the sinuses.  It works best if you are very hydrated! Please make sure you are drinking at least 64 ounces of water daily  You can also try humidifier or breathing in warm steamy shower.  Try these for the next 4 to 5 days. You can return if symptoms are worsening.      ED Prescriptions     Medication Sig Dispense Auth. Provider   fluticasone  (FLONASE) 50 MCG/ACT nasal spray Place 2 sprays into both nostrils daily. 9.9 mL Zaliyah Meikle, PA-C   cetirizine (ZYRTEC ALLERGY) 10 MG tablet Take 1 tablet (10 mg total) by mouth daily. 30 tablet Cully Luckow, PA-C   guaiFENesin (MUCINEX) 600 MG 12 hr tablet Take 2 tablets (1,200 mg total) by mouth 2 (two) times daily for 5 days. 20 tablet Pamelia Botto, Lurena Joiner, PA-C      PDMP not reviewed this encounter.   Kathrine Haddock 10/15/22 1437

## 2022-10-28 ENCOUNTER — Ambulatory Visit: Payer: Medicare Other | Admitting: Physician Assistant

## 2022-11-04 ENCOUNTER — Other Ambulatory Visit: Payer: Self-pay | Admitting: Internal Medicine

## 2022-11-04 ENCOUNTER — Telehealth: Payer: Self-pay

## 2022-11-04 DIAGNOSIS — E1142 Type 2 diabetes mellitus with diabetic polyneuropathy: Secondary | ICD-10-CM

## 2022-11-04 NOTE — Telephone Encounter (Signed)
Copied from CRM [id number]. Topic: General - Inquiry >> Nov 04, 2022 11:28 AM Haroldine Laws wrote: Reason for CRM: pt has been on Gabapentin from another provider that he does not see any longer.  He want to know if his provider here will prescribe it for him. CB#  [phone]

## 2022-11-04 NOTE — Telephone Encounter (Signed)
Called but no answer. Unable to LVM due to VM being full. Patient will be able to receive refill at next scheduled appointment.

## 2022-11-08 ENCOUNTER — Ambulatory Visit (INDEPENDENT_AMBULATORY_CARE_PROVIDER_SITE_OTHER): Payer: Medicare Other | Admitting: Physician Assistant

## 2022-11-08 ENCOUNTER — Telehealth: Payer: Self-pay

## 2022-11-08 ENCOUNTER — Encounter: Payer: Self-pay | Admitting: Physician Assistant

## 2022-11-08 VITALS — BP 134/74 | HR 81 | Ht 71.0 in | Wt 285.0 lb

## 2022-11-08 DIAGNOSIS — K6289 Other specified diseases of anus and rectum: Secondary | ICD-10-CM | POA: Diagnosis not present

## 2022-11-08 DIAGNOSIS — D509 Iron deficiency anemia, unspecified: Secondary | ICD-10-CM | POA: Diagnosis not present

## 2022-11-08 MED ORDER — TRAMADOL HCL 50 MG PO TABS: ORAL_TABLET | ORAL | 0 refills | Status: DC

## 2022-11-08 NOTE — Progress Notes (Signed)
Chief Complaint: Iron deficiency anemia and Rectal Pain  HPI:    [name] is a 67 year old male with a past medical history as listed below including diabetes, GERD and prostate cancer status postradiation, known to Dr. Lavon Colon, who was referred to me by Matthew Matar, MD for a complaint of iron deficiency anemia and rectal pain.      09/30/2021 colonoscopy with moderate diverticulosis in the sigmoid, descending and ascending colon, external and internal hemorrhoids and 1 less than 1 mm polyp in the transverse colon.    09/07/2022 CBC with a hemoglobin of 10.4 (7 months ago 10.4, 4 years ago 11.5, 6 years ago 14.5).  Normal platelets and MCV.  Iron studies with an iron low at 40 and percent saturation low at 12.  Ferritin 47.  That was a same-day had visit with oncology for follow-up of prostate cancer.  They discussed normocytic anemia with unknown etiology.  Pathology showed tubular adenoma and repeat recommended in 7 years.    Today, the patient tells me his main complaint is that he has been having a rectal pain/pressure over the past few months ever since having radiation to his prostate which is worse after a bowel movement.  Tells me he has not seen any blood in his stool.  Sometimes he has to call out of work because this pain is so bad, in fact he missed work on Friday and Saturday.  Very occasionally he will not have pain throughout the day.  Does not notice a change in his bowel habits, no constipation or diarrhea.    Chronic reflux symptoms controlled on Pantoprazole 20 mg daily.    Denies fever, chills, weight loss, nausea, vomiting or symptoms that awaken him from sleep.  Past Medical History:  Diagnosis Date   Allergy    Arthritis    DM (diabetes mellitus) (HCC)    Foot pain, bilateral    referred to podiatry 06/2014   GERD (gastroesophageal reflux disease)    long history of   Hyperlipidemia    Hypertension    Polyarthralgia    shoulders, knees, ankles, wrists;  neg rheum lab screen 04/2014   Prostate cancer (HCC)    Type 2 diabetes mellitus with diabetic neuropathy (HCC)    Wears glasses     Past Surgical History:  Procedure Laterality Date   COLONOSCOPY     referral pending 03/2015   NO PAST SURGERIES     as of 03/2015    Current Outpatient Medications  Medication Sig Dispense Refill   atorvastatin (LIPITOR) 80 MG tablet Take 1 tablet (80 mg total) by mouth daily. 90 tablet 3   calcium carbonate (OS-CAL - DOSED IN MG OF ELEMENTAL CALCIUM) 1250 (500 Ca) MG tablet Take 1 tablet by mouth daily with breakfast.     cetirizine (ZYRTEC ALLERGY) 10 MG tablet Take 1 tablet (10 mg total) by mouth daily. 30 tablet 2   cholecalciferol (VITAMIN D3) 25 MCG (1000 UNIT) tablet Take 1,000 Units by mouth daily.     ferrous sulfate 325 (65 FE) MG EC tablet Take 1 tablet (325 mg total) by mouth daily with breakfast. 30 tablet 6   fluticasone (FLONASE) 50 MCG/ACT nasal spray Place 2 sprays into both nostrils daily. 9.9 mL 2   folic acid (FOLVITE) 1 MG tablet Take 1 tablet (1 mg total) by mouth daily. 30 tablet 6   gabapentin (NEURONTIN) 600 MG tablet      meloxicam (MOBIC) 7.5 MG tablet TAKE 1  TABLET (7.5 MG TOTAL) BY MOUTH DAILY. PLEASE MAKE AN APPT 30 tablet 0   metFORMIN (GLUCOPHAGE-XR) 500 MG 24 hr tablet TAKE 1 TABLET BY MOUTH EVERY DAY WITH BREAKFAST 30 tablet 0   olmesartan (BENICAR) 20 MG tablet TAKE 1 TABLET BY MOUTH EVERY DAY 90 tablet 0   pantoprazole (PROTONIX) 20 MG tablet TAKE 1 TABLET BY MOUTH EVERY DAY 90 tablet 1   polyethylene glycol (MIRALAX / GLYCOLAX) 17 g packet Take by mouth.     pregabalin (LYRICA) 75 MG capsule Take 1 capsule (75 mg total) by mouth 2 (two) times daily. 30 capsule 0   senna (SENOKOT) 8.6 MG tablet Take 1 tablet (8.6 mg total) by mouth daily as needed for constipation. 30 tablet 3   sildenafil (VIAGRA) 100 MG tablet Take 1 tablet (100 mg total) by mouth daily as needed for erectile dysfunction. 30 tablet 11   tamsulosin  (FLOMAX) 0.4 MG CAPS capsule Take 1 capsule (0.4 mg total) by mouth daily after supper. 30 capsule 11   No current facility-administered medications for this visit.    Allergies as of 11/08/2022   (No Known Allergies)    Family History  Problem Relation Age of Onset   Diabetes Mother    Hypertension Mother    Other Father        murdered   Diabetes Sister    Arthritis Sister    Heart disease Sister    Diabetes Sister    Diabetes Sister    Cancer Neg Hx    Stroke Neg Hx    Prostate cancer Neg Hx        unknown   Bladder Cancer Neg Hx        unkown   Kidney cancer Neg Hx        unkown    Social History   Socioeconomic History   Marital status: Divorced    Spouse name: Not on file   Number of children: 3   Years of education: Not on file   Highest education level: Not on file  Occupational History   Occupation: switcher/ spotter/forklift  Tobacco Use   Smoking status: Some Days    Types: Cigarettes    Passive exposure: Current   Smokeless tobacco: Never  Vaping Use   Vaping Use: Never used  Substance and Sexual Activity   Alcohol use: Yes    Comment: occasional beer   Drug use: No   Sexual activity: Yes  Other Topics Concern   Not on file  Social History Narrative   Lives with his sister.   Works as a Engineer, civil (consulting) in [employer], [employer].   Exercise - active on the job.  Has 3 children in [place].   Social Determinants of Health   Financial Resource Strain: Not on file  Food Insecurity: Not on file  Transportation Needs: Not on file  Physical Activity: Not on file  Stress: Not on file  Social Connections: Not on file  Intimate Partner Violence: Not on file    Review of Systems:    Constitutional: No weight loss, fever or chills Cardiovascular: No chest pain Respiratory: No SOB  Gastrointestinal: See HPI and otherwise negative   Physical Exam:  Vital signs: BP 134/74   Pulse 81   Ht 5\' 11"  (1.803 m)   Wt 285 lb (129.3 kg)   BMI 39.75 kg/m     Constitutional:   Pleasant obese AA male appears to be in NAD, Well developed, Well nourished, alert and cooperative Respiratory:  Respirations even and unlabored. Lungs clear to auscultation bilaterally.   No wheezes, crackles, or rhonchi.  Cardiovascular: Normal S1, S2. No MRG. Regular rate and rhythm. No peripheral edema, cyanosis or pallor.  Gastrointestinal:  Soft, nondistended, nontender. No rebound or guarding. Normal bowel sounds. No appreciable masses or hepatomegaly. Rectal: External: No abnormality; internal: Tight sphincter tone, tender to palpation; anoscopy: Minimally visualized rectal tissue given tight sphincter and tenderness, no visualized fissure or bleeding Psychiatric: Demonstrates good judgement and reason without abnormal affect or behaviors.  RELEVANT LABS AND IMAGING: CBC    Component Value Date/Time   WBC 7.3 09/07/2022 1004   WBC 10.9 (H) 05/06/2018 0805   RBC 3.51 (L) 09/07/2022 1122   RBC 3.58 (L) 09/07/2022 1004   HGB 10.4 (L) 09/07/2022 1004   HCT 31.1 (L) 09/07/2022 1004   PLT 284 09/07/2022 1004   MCV 86.9 09/07/2022 1004   MCH 29.1 09/07/2022 1004   MCHC 33.4 09/07/2022 1004   RDW 15.1 09/07/2022 1004   LYMPHSABS 0.9 09/07/2022 1004   MONOABS 0.5 09/07/2022 1004   EOSABS 0.3 09/07/2022 1004   BASOSABS 0.0 09/07/2022 1004    CMP     Component Value Date/Time   NA 139 09/07/2022 1004   NA 141 12/16/2020 0913   K 4.3 09/07/2022 1004   CL 107 09/07/2022 1004   CO2 26 09/07/2022 1004   GLUCOSE 157 (H) 09/07/2022 1004   BUN 17 09/07/2022 1004   BUN 11 12/16/2020 0913   CREATININE 1.18 09/07/2022 1004   CREATININE 1.18 12/05/2015 0841   CALCIUM 9.1 09/07/2022 1004   PROT 6.5 09/07/2022 1004   PROT 6.8 12/16/2020 0913   ALBUMIN 3.7 09/07/2022 1004   ALBUMIN 4.3 12/16/2020 0913   AST 15 09/07/2022 1004   ALT 7 09/07/2022 1004   ALKPHOS 80 09/07/2022 1004   BILITOT 0.3 09/07/2022 1004   GFRNONAA >60 09/07/2022 1004   GFRAA >60 05/06/2018  0805    Assessment: 1.  IDA: Recently low iron discovered by his heme/unk team following him for previous prostate cancer with recent radiation, they wanted GI workup, recent colonoscopy in March of last year unrevealing for cause, consider upper GI/small bowel source of bleeding or malabsorption 2.  Rectal pain: For the past few months, ever since radiation, and anoscopy unrevealing today; consider radiation proctitis versus other  Plan: 1.  Given severe rectal pain with etiology unable to be visualized at time of exam today recommend a flex sig for further evaluation.  Also scheduled patient for an EGD given his iron deficiency anemia.  Did provide the patient a detailed list of risks for the procedures and he agrees to proceed.  These were scheduled with Dr. Russella Dar as he had next available appointment.  Patient will continue to follow with Dr. Lavon Colon after time of procedures. Patient is appropriate for endoscopic procedure(s) in the ambulatory ([facility]) setting.  2.  Prescribed Tramadol 50 mg every 4-6 hours as needed for rectal pain.  #30 with no refill 3.  Discussed possibility of radiation proctitis with the patient. 4.  Patient to follow in clinic per recommendations after time of procedures.  Hyacinth Meeker, PA-C [facility] 11/08/2022, 9:05 AM  Cc: Matthew Matar, MD

## 2022-11-08 NOTE — Telephone Encounter (Signed)
Script of tramadol was given to patient. He didn't tell us that he was prescribed percocet to [place] in April and we can't fill this medication at all. Made [facility] pharmacy aware and patient that he needs to go back to the prescriber of Percocet and ask for any pain medications. And they voiced understanding

## 2022-11-08 NOTE — Patient Instructions (Addendum)
_______________________________________________________  If your blood pressure at your visit was 140/90 or greater, please contact your primary care physician to follow up on this.  _______________________________________________________  If you are age 66 or older, your body mass index should be between 23-30. Your Body mass index is 39.75 kg/m. If this is out of the aforementioned range listed, please consider follow up with your Primary Care Provider.  If you are age 4 or younger, your body mass index should be between 19-25. Your Body mass index is 39.75 kg/m. If this is out of the aformentioned range listed, please consider follow up with your Primary Care Provider.   ________________________________________________________  The  GI providers would like to encourage you to use [facility] to communicate with providers for non-urgent requests or questions.  Due to long hold times on the telephone, sending your provider a message by [facility] may be a faster and more efficient way to get a response.  Please allow 48 business hours for a response.  Please remember that this is for non-urgent requests.  _______________________________________________________  Matthew Colon have been scheduled for an endoscopy and flexible sigmoidoscopy. Please follow the written instructions given to you at your visit today. If you use inhalers (even only as needed), please bring them with you on the day of your procedure.  We have sent the following medications to your pharmacy for you to pick up at your convenience: Tramadol  It was a pleasure to see you today!  Thank you for trusting me with your gastrointestinal care!

## 2022-11-08 NOTE — Telephone Encounter (Signed)
-----   Message from Unk Lightning, [place] sent at 11/08/2022  1:28 PM EDT ----- Regarding: pelvic mri Please see dr Terressa Koyanagi- he would like pelvic mri prior to flex sig. Thanks-JLL ----- Message ----- From: Meryl Dare, MD Sent: 11/08/2022  11:56 AM EDT To: Unk Lightning, PA     ----- Message ----- From: Unk Lightning, [place] Sent: 11/08/2022   9:43 AM EDT To: Meryl Dare, MD; Napoleon Form, MD

## 2022-11-08 NOTE — Progress Notes (Signed)
Reviewed and agree with management plan. In addition schedule pelvic MRI to evaluate rectal pain, ideally before EGD/Flex sig.  Venita Lick. Russella Dar, MD [facility]

## 2022-11-08 NOTE — Telephone Encounter (Signed)
Called and spoke with patient regarding recommendation for MRI pelvis prior to scheduled procedures. Pt knows to expect a call from radiology scheduling to set up his appt. Pt asked that I send this information to his MyChart as well in case he misses the call. Pt had no concerns at the end of the call.   MRI pelvis order in epic. Secure staff message sent to radiology scheduling to contact patient to set up appt.

## 2022-11-11 ENCOUNTER — Telehealth: Payer: Self-pay | Admitting: Physician Assistant

## 2022-11-11 ENCOUNTER — Telehealth: Payer: Self-pay | Admitting: Internal Medicine

## 2022-11-11 NOTE — Telephone Encounter (Signed)
Attempted to return pts call and received message that voice mailbox has not been set up yet. Unable to reach pt or leave a message.

## 2022-11-11 NOTE — Telephone Encounter (Signed)
Copied from CRM [id number]. Topic: General - Inquiry >> Nov 11, 2022  8:18 AM Patsy Lager T wrote: Reason for CRM: patient wants to see if provider can prescribe his Gabapentin as the visit he has this morning is $140 and he does not want to pay for 2 doctor visits if his PCP can prescribe the medication for him. Please contact patient to advise

## 2022-11-11 NOTE — Telephone Encounter (Signed)
PT wants to know if his PROPOFOL ENDO FLEX on 5/30 can be performed by a woman or is this something that Dr. Russella Dar specializes in. Requesting call back

## 2022-11-12 NOTE — Telephone Encounter (Signed)
Called & spoke to the patient. Verified name & DOB. Informed that he will need to be seen in order to receive his Gabapentin. Informed patient of upcoming appointment on 11/17/2022 and advised to keep appointment. Patient confirmed appointment and expressed verbal understanding. No further questions at this time.

## 2022-11-12 NOTE — Telephone Encounter (Signed)
MRI pelvis is scheduled on 11/16/22 at 7 am

## 2022-11-16 ENCOUNTER — Ambulatory Visit (HOSPITAL_COMMUNITY)
Admission: RE | Admit: 2022-11-16 | Discharge: 2022-11-16 | Disposition: A | Discharge status: Home / Self Care | Payer: Medicare Other | Source: Ambulatory Visit | Attending: Physician Assistant | Admitting: Physician Assistant

## 2022-11-16 DIAGNOSIS — K6289 Other specified diseases of anus and rectum: Secondary | ICD-10-CM

## 2022-11-16 MED ORDER — GADOBUTROL 1 MMOL/ML IV SOLN
10.0000 mL | Freq: Once | INTRAVENOUS | Status: AC | PRN
  Administered 2022-11-16: 10 mL via INTRAVENOUS

## 2022-11-16 NOTE — Telephone Encounter (Signed)
Called and informed patient that his provider is Dr. Russella Dar and he will perform his flex sig. Matthew Colon does not due procedures. Pt verbalized understanding and had no concerns at the end of the call.

## 2022-11-17 ENCOUNTER — Encounter: Payer: Self-pay | Admitting: Physician Assistant

## 2022-11-17 ENCOUNTER — Ambulatory Visit: Payer: Medicare Other | Attending: Physician Assistant | Admitting: Physician Assistant

## 2022-11-17 VITALS — BP 128/68 | HR 94 | Ht 71.0 in | Wt 281.6 lb

## 2022-11-17 DIAGNOSIS — R252 Cramp and spasm: Secondary | ICD-10-CM | POA: Diagnosis not present

## 2022-11-17 DIAGNOSIS — C61 Malignant neoplasm of prostate: Secondary | ICD-10-CM | POA: Insufficient documentation

## 2022-11-17 DIAGNOSIS — I152 Hypertension secondary to endocrine disorders: Secondary | ICD-10-CM | POA: Diagnosis not present

## 2022-11-17 DIAGNOSIS — Z7984 Long term (current) use of oral hypoglycemic drugs: Secondary | ICD-10-CM

## 2022-11-17 DIAGNOSIS — E1165 Type 2 diabetes mellitus with hyperglycemia: Secondary | ICD-10-CM | POA: Insufficient documentation

## 2022-11-17 DIAGNOSIS — Z79899 Other long term (current) drug therapy: Secondary | ICD-10-CM | POA: Insufficient documentation

## 2022-11-17 DIAGNOSIS — E785 Hyperlipidemia, unspecified: Secondary | ICD-10-CM | POA: Diagnosis not present

## 2022-11-17 DIAGNOSIS — I10 Essential (primary) hypertension: Secondary | ICD-10-CM | POA: Insufficient documentation

## 2022-11-17 DIAGNOSIS — E1159 Type 2 diabetes mellitus with other circulatory complications: Secondary | ICD-10-CM

## 2022-11-17 DIAGNOSIS — E1142 Type 2 diabetes mellitus with diabetic polyneuropathy: Secondary | ICD-10-CM | POA: Diagnosis not present

## 2022-11-17 DIAGNOSIS — E1169 Type 2 diabetes mellitus with other specified complication: Secondary | ICD-10-CM

## 2022-11-17 DIAGNOSIS — K219 Gastro-esophageal reflux disease without esophagitis: Secondary | ICD-10-CM

## 2022-11-17 DIAGNOSIS — R739 Hyperglycemia, unspecified: Secondary | ICD-10-CM

## 2022-11-17 LAB — POCT GLYCOSYLATED HEMOGLOBIN (HGB A1C): HbA1c, POC (controlled diabetic range): 5.8 % (ref 0.0–7.0)

## 2022-11-17 LAB — GLUCOSE, POCT (MANUAL RESULT ENTRY): POC Glucose: 157 mg/dl — AB (ref 70–99)

## 2022-11-17 MED ORDER — ATORVASTATIN CALCIUM 80 MG PO TABS
80.0000 mg | ORAL_TABLET | Freq: Every day | ORAL | 3 refills | Status: DC
Start: 2022-11-17 — End: 2023-03-10

## 2022-11-17 MED ORDER — GABAPENTIN 600 MG PO TABS: 600.0000 mg | ORAL_TABLET | Freq: Three times a day (TID) | ORAL | 0 refills | Status: DC

## 2022-11-17 MED ORDER — FLUTICASONE PROPIONATE 50 MCG/ACT NA SUSP: 2.0000 | Freq: Every day | NASAL | 2 refills | Status: DC

## 2022-11-17 MED ORDER — PANTOPRAZOLE SODIUM 20 MG PO TBEC
20.0000 mg | DELAYED_RELEASE_TABLET | Freq: Every day | ORAL | 1 refills | Status: DC
Start: 2022-11-17 — End: 2023-04-20

## 2022-11-17 MED ORDER — OLMESARTAN MEDOXOMIL 20 MG PO TABS
20.0000 mg | ORAL_TABLET | Freq: Every day | ORAL | 1 refills | Status: DC
Start: 2022-11-17 — End: 2023-03-10

## 2022-11-17 MED ORDER — METFORMIN HCL ER 500 MG PO TB24
ORAL_TABLET | ORAL | 1 refills | Status: DC
Start: 2022-11-17 — End: 2023-03-10

## 2022-11-17 MED ORDER — CETIRIZINE HCL 10 MG PO TABS: 10.0000 mg | ORAL_TABLET | Freq: Every day | ORAL | 1 refills | Status: DC

## 2022-11-17 MED ORDER — MELOXICAM 7.5 MG PO TABS: 7.5000 mg | ORAL_TABLET | Freq: Every day | ORAL | 1 refills | Status: DC

## 2022-11-17 MED ORDER — TAMSULOSIN HCL 0.4 MG PO CAPS: 0.4000 mg | ORAL_CAPSULE | Freq: Every day | ORAL | 1 refills | Status: DC

## 2022-11-17 NOTE — Progress Notes (Signed)
Patient ID: Matthew Flaming Sr., male   DOB: 1955-12-10, 67 y.o.   MRN: [id number]    Matthew Colon, is a 67 y.o. male  [id number]  [id number]  DOB - January 21, 1956  Chief Complaint  Patient presents with   Diabetes   Medication Refill       Subjective:   Matthew Colon is a 66 y.o. male here today for med RF.  He does c/o intermittent leg cramps that have been occurring for a while.  He has 1 more hormonal treatment for prostate CA and that is scheduled for 03/2023.  He is doing well overall.  Has not been seen in clinic since 01/2022.  Here requesting gabapentin.  Currently being seen regularly by gastro and oncology due to prostate CA.     No problems updated.  ALLERGIES: No Known Allergies  PAST MEDICAL HISTORY: Past Medical History:  Diagnosis Date   Allergy    Arthritis    DM (diabetes mellitus) (HCC)    Foot pain, bilateral    referred to podiatry 06/2014   GERD (gastroesophageal reflux disease)    long history of   Hyperlipidemia    Hypertension    Polyarthralgia    shoulders, knees, ankles, wrists; neg rheum lab screen 04/2014   Prostate cancer (HCC)    Type 2 diabetes mellitus with diabetic neuropathy (HCC)    Wears glasses     MEDICATIONS AT HOME: Prior to Admission medications   Medication Sig Start Date End Date Taking? Authorizing Provider  calcium carbonate (OS-CAL - DOSED IN MG OF ELEMENTAL CALCIUM) 1250 (500 Ca) MG tablet Take 1 tablet by mouth daily with breakfast.   Yes [provider]  cholecalciferol (VITAMIN D3) 25 MCG (1000 UNIT) tablet Take 1,000 Units by mouth daily.   Yes [provider]  ferrous sulfate 325 (65 FE) MG EC tablet Take 1 tablet (325 mg total) by mouth daily with breakfast. 09/20/22  Yes Thayil, Irene T, PA-C  folic acid (FOLVITE) 1 MG tablet Take 1 tablet (1 mg total) by mouth daily. 09/20/22  Yes Georga Kaufmann T, PA-C  polyethylene glycol (MIRALAX / GLYCOLAX) 17 g packet Take by mouth. 07/31/20  Yes [provider]  senna (SENOKOT) 8.6 MG tablet Take 1 tablet (8.6 mg total) by mouth daily as needed for constipation. 07/21/21  Yes Marcine Matar, MD  sildenafil (VIAGRA) 100 MG tablet Take 1 tablet (100 mg total) by mouth daily as needed for erectile dysfunction. 08/10/22  Yes Sondra Come, MD  traMADol Janean Sark) 50 MG tablet Take 1 tablet every 4-6 hours as needed 11/08/22  Yes Lemmon, Violet Baldy, PA  atorvastatin (LIPITOR) 80 MG tablet Take 1 tablet (80 mg total) by mouth daily. 11/17/22   Anders Simmonds, PA-C  cetirizine (ZYRTEC ALLERGY) 10 MG tablet Take 1 tablet (10 mg total) by mouth daily. 11/17/22   Anders Simmonds, PA-C  fluticasone (FLONASE) 50 MCG/ACT nasal spray Place 2 sprays into both nostrils daily. 11/17/22   Anders Simmonds, PA-C  gabapentin (NEURONTIN) 600 MG tablet Take 1 tablet (600 mg total) by mouth 3 (three) times daily. 11/17/22   Anders Simmonds, PA-C  meloxicam (MOBIC) 7.5 MG tablet Take 1 tablet (7.5 mg total) by mouth daily. Please make an appt 11/17/22   Anders Simmonds, PA-C  metFORMIN (GLUCOPHAGE-XR) 500 MG 24 hr tablet TAKE 1 TABLET BY MOUTH EVERY DAY WITH BREAKFAST 11/17/22   Georgian Co M, PA-C  olmesartan (BENICAR) 20 MG tablet Take  1 tablet (20 mg total) by mouth daily. 11/17/22   Anders Simmonds, PA-C  pantoprazole (PROTONIX) 20 MG tablet Take 1 tablet (20 mg total) by mouth daily. 11/17/22   Anders Simmonds, PA-C  tamsulosin (FLOMAX) 0.4 MG CAPS capsule Take 1 capsule (0.4 mg total) by mouth daily after supper. 11/17/22   Korin Hartwell, Marzella Schlein, PA-C    ROS: Neg HEENT Neg resp Neg cardiac Neg GI Neg GU Neg psych Neg neuro  Objective:   Vitals:   11/17/22 0916 11/17/22 0931  BP: (!) 148/81 128/68  Pulse: 94   SpO2: 94%   Weight: 281 lb 9.6 oz (127.7 kg)   Height: 5\' 11"  (1.803 m)    Exam General appearance : Awake, alert, not in any distress. Speech Clear. Not toxic looking HEENT: Atraumatic and Normocephalic Neck: Supple, no JVD.  No cervical lymphadenopathy.  Chest: Good air entry bilaterally, CTAB.  No rales/rhonchi/wheezing CVS: S1 S2 regular, no murmurs.  Extremities: B/L Lower Ext shows no edema, both legs are warm to touch Neurology: Awake alert, and oriented X 3, CN II-XII intact, Non focal Skin: No Rash  Data Review Lab Results  Component Value Date   HGBA1C 5.8 11/17/2022   HGBA1C 5.9 01/21/2022   HGBA1C 6.3 07/21/2021    Assessment & Plan   1. Hyperglycemia Doing great-continue current regimen - Glucose (CBG) - HgB A1c  2. Type 2 diabetes mellitus with peripheral neuropathy (HCC) - metFORMIN (GLUCOPHAGE-XR) 500 MG 24 hr tablet; TAKE 1 TABLET BY MOUTH EVERY DAY WITH BREAKFAST  Dispense: 90 tablet; Refill: 1  3. Hypertension associated with diabetes (HCC) controlled - olmesartan (BENICAR) 20 MG tablet; Take 1 tablet (20 mg total) by mouth daily.  Dispense: 90 tablet; Refill: 1 - TSH - Basic metabolic panel  4. Gastroesophageal reflux disease without esophagitis - pantoprazole (PROTONIX) 20 MG tablet; Take 1 tablet (20 mg total) by mouth daily.  Dispense: 90 tablet; Refill: 1  5. Hyperlipidemia associated with type 2 diabetes mellitus (HCC) - atorvastatin (LIPITOR) 80 MG tablet; Take 1 tablet (80 mg total) by mouth daily.  Dispense: 90 tablet; Refill: 3  6. Muscle cramps - TSH - Basic metabolic panel  Reviewed most recent labs-being followed closely by oncololgy and gastro  Return in about 6 months (around 05/20/2023) for PCP for chronic conditions.  The patient was given clear instructions to go to ER or return to medical center if symptoms don't improve, worsen or new problems develop. The patient verbalized understanding. The patient was told to call to get lab results if they haven't heard anything in the next week.      Georgian Co, PA-C [facility] and [facility] [place], [place] [phone]   11/17/2022, 9:33 AM

## 2022-11-17 NOTE — Progress Notes (Signed)
Reviewed and agree with documentation and assessment and plan. K. Veena Yosselyn Tax , MD   

## 2022-11-17 NOTE — Patient Instructions (Signed)
Muscle Cramps and Spasms Muscle cramps and spasms happen when muscles tighten on their own. They can be in any muscle. They happen most often in the muscles in the back of your lower leg (calf). Muscle cramps are painful. They are often stronger and last longer than muscle spasms. Muscle spasms may or may not be painful. In many cases, the cause of a muscle cramp or spasm is not known. But it may be from: Doing more work or exercise than your body is ready for. Using the muscles too much (overuse). Staying in one position for too long. Not preparing enough or having bad form when you play a sport or do an activity. Getting hurt. Other causes may include: Not enough water in your body (dehydration). Taking certain medicines. Not having enough salts and minerals in the body (electrolytes). Follow these instructions at home: Eating and drinking Drink enough fluid to keep your pee (urine) pale yellow. Eat a healthy diet to help your muscles work well. Your diet should include: Fruits and vegetables. Lean protein. Whole grains. Low-fat or nonfat dairy products. Managing pain and stiffness     Massage, stretch, and relax the muscle. Do this for a few minutes at a time. If told, put ice on the muscle. This may help if you are sore or have pain after a cramp or spasm. Put ice in a plastic bag. Place a towel between your skin and the bag. Leave the ice on for 20 minutes, 2-3 times a day. If told, put heat on tight or tense muscles. Do this as often as told by your doctor. Use the heat source that your doctor recommends, such as a moist heat pack or a heating pad. Place a towel between your skin and the heat source. Leave the heat on for 20-30 minutes. If your skin turns bright red, take off the ice or heat right away to prevent skin damage. The risk of damage is higher if you cannot feel pain, heat, or cold. Take hot showers or baths to help relax the muscles. General instructions If you  are having cramps often, avoid intense exercise for a few days. Take over-the-counter and prescription medicines only as told by your doctor. Watch for any changes in your symptoms. Contact a doctor if: Your cramps or spasms get worse or happen more often. Your cramps or spasms do not get better with time. This information is not intended to replace advice given to you by your health care provider. Make sure you discuss any questions you have with your health care provider. Document Revised: 02/09/2022 Document Reviewed: 02/09/2022 Elsevier Patient Education  2023 [employer]  

## 2022-11-18 LAB — BASIC METABOLIC PANEL
BUN/Creatinine Ratio: 16 (ref 10–24)
BUN: 18 mg/dL (ref 8–27)
CO2: 20 mmol/L (ref 20–29)
Calcium: 9.6 mg/dL (ref 8.6–10.2)
Chloride: 106 mmol/L (ref 96–106)
Creatinine, Ser: 1.12 mg/dL (ref 0.76–1.27)
Glucose: 133 mg/dL — ABNORMAL HIGH (ref 70–99)
Potassium: 4.2 mmol/L (ref 3.5–5.2)
Sodium: 141 mmol/L (ref 134–144)
eGFR: 73 mL/min/{1.73_m2} (ref >59)

## 2022-11-18 LAB — TSH: TSH: 2.18 u[IU]/mL (ref 0.450–4.500)

## 2022-12-02 ENCOUNTER — Telehealth: Payer: Self-pay | Admitting: Gastroenterology

## 2022-12-02 ENCOUNTER — Encounter: Payer: Medicare Other | Admitting: Gastroenterology

## 2022-12-02 NOTE — Telephone Encounter (Signed)
Called and spoke with pt. He did not see the section on his instructions about the magnesium citrate the night before and he ate a hamburger at about midnight as he works second shift. Procedure rescheduled for 12/13/22 at 3p. New instructions sent via MyChart. Pt verbalized he has access to MyChart and he will review his instructions. RN reviewed with him over the phone.

## 2022-12-02 NOTE — Telephone Encounter (Signed)
FYI

## 2022-12-02 NOTE — Telephone Encounter (Signed)
Patient called stating for his procedure today he did not purchase Magnesium citrate as instructed. He did purchase Fleet Enema and followed instructions. He also stated he ate a hamburger last night. Requesting a call back to see if his procedure needs to be rescheduled. Please advise, thank you.

## 2022-12-02 NOTE — Telephone Encounter (Signed)
This is Dr. [name] patient so if she has an [facility] time available in June please scheduled with her

## 2022-12-02 NOTE — Telephone Encounter (Signed)
OK 

## 2022-12-02 NOTE — Telephone Encounter (Signed)
Her first available isn't until August 20th. I will keep his appt with you if that's okay.

## 2022-12-13 ENCOUNTER — Encounter: Payer: Medicare Other | Admitting: Gastroenterology

## 2022-12-14 ENCOUNTER — Encounter: Payer: Medicare Other | Admitting: Gastroenterology

## 2022-12-15 ENCOUNTER — Encounter: Payer: Self-pay | Admitting: Gastroenterology

## 2022-12-15 ENCOUNTER — Ambulatory Visit (AMBULATORY_SURGERY_CENTER): Payer: Medicare Other | Admitting: Gastroenterology

## 2022-12-15 VITALS — BP 123/84 | HR 73 | Temp 98.9°F | Resp 16

## 2022-12-15 DIAGNOSIS — D509 Iron deficiency anemia, unspecified: Secondary | ICD-10-CM

## 2022-12-15 DIAGNOSIS — K6289 Other specified diseases of anus and rectum: Secondary | ICD-10-CM

## 2022-12-15 MED ORDER — SODIUM CHLORIDE 0.9 % IV SOLN
500.0000 mL | Freq: Once | INTRAVENOUS | Status: DC
Start: 2022-12-15 — End: 2022-12-15

## 2022-12-15 NOTE — Progress Notes (Signed)
Uneventful anesthetic. Report to pacu rn. Vss. Care resumed by rn. 

## 2022-12-15 NOTE — Patient Instructions (Signed)
Please read handouts provided. Continue present medications. 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   YOU HAD AN ENDOSCOPIC PROCEDURE TODAY AT THE [facility]:   Refer to the procedure report that was given to you for any specific questions about what was found during the examination.  If the procedure report does not answer your questions, please call your gastroenterologist to clarify.  If you requested that your care partner not be given the details of your procedure findings, then the procedure report has been included in a sealed envelope for you to review at your convenience later.  YOU SHOULD EXPECT: Some feelings of bloating in the abdomen. Passage of more gas than usual.  Walking can help get rid of the air that was put into your GI tract during the procedure and reduce the bloating. If you had a lower endoscopy (such as a colonoscopy or flexible sigmoidoscopy) you may notice spotting of blood in your stool or on the toilet paper. If you underwent a bowel prep for your procedure, you may not have a normal bowel movement for a few days.  Please Note:  You might notice some irritation and congestion in your nose or some drainage.  This is from the oxygen used during your procedure.  There is no need for concern and it should clear up in a day or so.  SYMPTOMS TO REPORT IMMEDIATELY:  Following lower endoscopy (colonoscopy or flexible sigmoidoscopy):  Excessive amounts of blood in the stool  Significant tenderness or worsening of abdominal pains  Swelling of the abdomen that is new, acute  Fever of 100F or higher  For urgent or emergent issues, a gastroenterologist can be reached at any hour by calling [phone]. Do not use MyChart messaging for urgent concerns.    DIET:  We do recommend a small meal at first, but then you may proceed to your regular diet.  Drink plenty of fluids but you should avoid alcoholic beverages for 24 hours.  ACTIVITY:   You should plan to take it easy for the rest of today and you should NOT DRIVE or use heavy machinery until tomorrow (because of the sedation medicines used during the test).    FOLLOW UP: Our staff will call the number listed on your records the next business day following your procedure.  We will call around 7:15- 8:00 am to check on you and address any questions or concerns that you may have regarding the information given to you following your procedure. If we do not reach you, we will leave a message.     If any biopsies were taken you will be contacted by phone or by letter within the next 1-3 weeks.  Please call us at [phone] if you have not heard about the biopsies in 3 weeks.    SIGNATURES/CONFIDENTIALITY: You and/or your care partner have signed paperwork which will be entered into your electronic medical record.  These signatures attest to the fact that that the information above on your After Visit Summary has been reviewed and is understood.  Full responsibility of the confidentiality of this discharge information lies with you and/or your care-partner.

## 2022-12-15 NOTE — Op Note (Signed)
[facility] Patient Name: Matthew Colon Procedure Date: 12/15/2022 10:30 AM MRN: [id number] Endoscopist: Napoleon Form , MD, [id number] Age: 67 Referring MD:  Date of Birth: 1955/08/25 Gender: Male Account #: [id number] Procedure:                Flexible Sigmoidoscopy Indications:              Rectal pain, abnormal MRI pelvis Medicines:                Propofol per Anesthesia Procedure:                Pre-Anesthesia Assessment:                           - Prior to the procedure, a History and Physical                            was performed, and patient medications and                            allergies were reviewed. The patient's tolerance of                            previous anesthesia was also reviewed. The risks                            and benefits of the procedure and the sedation                            options and risks were discussed with the patient.                            All questions were answered, and informed consent                            was obtained. Prior Anticoagulants: The patient has                            taken no anticoagulant or antiplatelet agents. ASA                            Grade Assessment: III - A patient with severe                            systemic disease. After reviewing the risks and                            benefits, the patient was deemed in satisfactory                            condition to undergo the procedure.                           After obtaining informed consent, the scope was  passed under direct vision. The GIF [id number] #[id number]                            was introduced through the anus and advanced to the                            the descending colon. The flexible sigmoidoscopy                            was accomplished without difficulty. The patient                            tolerated the procedure well. The quality of the                            bowel  preparation was good. Scope In: Scope Out: Findings:                 The perianal and digital rectal examinations were                            normal.                           The mucosa vascular pattern in the rectum was                            segmentally increased with patchy mild radiation                            induced proctitis.                           Anal papilla(e) were hypertrophied.                           Non-bleeding internal hemorrhoids were found during                            retroflexion. The hemorrhoids were medium-sized.                           The exam was otherwise without abnormality. Complications:            No immediate complications. Estimated Blood Loss:     Estimated blood loss was minimal. Impression:               - Increased mucosa vascular pattern in the rectum.                           - Anal papilla(e) were hypertrophied.                           - Non-bleeding internal hemorrhoids.                           - The examination was otherwise normal.                           -  No specimens collected. Recommendation:           - Resume previous diet.                           - Continue present medications.                           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

## 2022-12-15 NOTE — Op Note (Signed)
[facility] Patient Name: Matthew Colon Procedure Date: 12/15/2022 10:30 AM MRN: [id number] Endoscopist: Napoleon Form , MD, [id number] Age: 67 Referring MD:  Date of Birth: [birth year] Gender: Male Account #: [id number] Procedure:                Upper GI endoscopy Indications:              Iron deficiency anemia due to suspected upper                            gastrointestinal bleeding, Suspected upper                            gastrointestinal bleeding in patient with                            unexplained iron deficiency anemia Medicines:                Monitored Anesthesia Care Procedure:                Pre-Anesthesia Assessment:                           - Prior to the procedure, a History and Physical                            was performed, and patient medications and                            allergies were reviewed. The patient's tolerance of                            previous anesthesia was also reviewed. The risks                            and benefits of the procedure and the sedation                            options and risks were discussed with the patient.                            All questions were answered, and informed consent                            was obtained. Prior Anticoagulants: The patient has                            taken no anticoagulant or antiplatelet agents. ASA                            Grade Assessment: III - A patient with severe                            systemic disease. After reviewing the risks and  benefits, the patient was deemed in satisfactory                            condition to undergo the procedure.                           After obtaining informed consent, the endoscope was                            passed under direct vision. Throughout the                            procedure, the patient's blood pressure, pulse, and                            oxygen saturations were  monitored continuously. The                            GIF [id number] #[id number] was introduced through the                            mouth, and advanced to the second part of duodenum.                            The upper GI endoscopy was accomplished without                            difficulty. The patient tolerated the procedure                            well. Scope In: Scope Out: Findings:                 The Z-line was regular and was found 38 cm from the                            incisors.                           No gross lesions were noted in the entire esophagus.                           The stomach was normal.                           The cardia and gastric fundus were normal on                            retroflexion.                           The examined duodenum was normal. Complications:            No immediate complications. Estimated Blood Loss:     Estimated blood loss: none. Impression:               - Z-line regular,  38 cm from the incisors.                           - No gross lesions in the entire esophagus.                           - Normal stomach.                           - Normal examined duodenum.                           - No specimens collected. Recommendation:           - Patient has a contact number available for                            emergencies. The signs and symptoms of potential                            delayed complications were discussed with the                            patient. Return to normal activities tomorrow.                            Written discharge instructions were provided to the                            patient.                           - Resume previous diet.                           - Continue present medications. Napoleon Form, MD 12/15/2022 11:00:14 AM This report has been signed electronically.

## 2022-12-15 NOTE — Progress Notes (Signed)
[facility] History and Physical   Primary Care Physician:  Marcine Matar, MD   Reason for Procedure:  Iron deficiency anemia, rectal pain  Plan:    EGD and flexible sigmoidoscopy with possible interventions as needed     HPI: Matthew Sinagra Sr. is a very pleasant 67 y.o. male here for EGD and flex sig for evaluation of iron def anemia and rectal pain. H/o prostate Ca s/p radiation. .  Please refer to office visit note by Hyacinth Meeker 11/08/22 for additional details  The risks and benefits as well as alternatives of endoscopic procedure(s) have been discussed and reviewed. All questions answered. The patient agrees to proceed.    Past Medical History:  Diagnosis Date   Allergy    Arthritis    DM (diabetes mellitus) (HCC)    Foot pain, bilateral    referred to podiatry 06/2014   GERD (gastroesophageal reflux disease)    long history of   Hyperlipidemia    Hypertension    Polyarthralgia    shoulders, knees, ankles, wrists; neg rheum lab screen 04/2014   Prostate cancer (HCC)    Type 2 diabetes mellitus with diabetic neuropathy (HCC)    Wears glasses     Past Surgical History:  Procedure Laterality Date   COLONOSCOPY     referral pending 03/2015   NO PAST SURGERIES     as of 03/2015    Prior to Admission medications   Medication Sig Start Date End Date Taking? Authorizing Provider  calcium carbonate (OS-CAL - DOSED IN MG OF ELEMENTAL CALCIUM) 1250 (500 Ca) MG tablet Take 1 tablet by mouth daily with breakfast.   Yes [provider]  cetirizine (ZYRTEC ALLERGY) 10 MG tablet Take 1 tablet (10 mg total) by mouth daily. 11/17/22  Yes Anders Simmonds, PA-C  ferrous sulfate 325 (65 FE) MG EC tablet Take 1 tablet (325 mg total) by mouth daily with breakfast. 09/20/22  Yes Thayil, Irene T, PA-C  fluticasone (FLONASE) 50 MCG/ACT nasal spray Place 2 sprays into both nostrils daily. 11/17/22  Yes Georgian Co M, PA-C  folic acid (FOLVITE) 1 MG  tablet Take 1 tablet (1 mg total) by mouth daily. 09/20/22  Yes Georga Kaufmann T, PA-C  gabapentin (NEURONTIN) 600 MG tablet Take 1 tablet (600 mg total) by mouth 3 (three) times daily. 11/17/22  Yes Georgian Co M, PA-C  meloxicam (MOBIC) 7.5 MG tablet Take 1 tablet (7.5 mg total) by mouth daily. Please make an appt 11/17/22  Yes Anders Simmonds, PA-C  metFORMIN (GLUCOPHAGE-XR) 500 MG 24 hr tablet TAKE 1 TABLET BY MOUTH EVERY DAY WITH BREAKFAST 11/17/22  Yes McClung, Angela M, PA-C  olmesartan (BENICAR) 20 MG tablet Take 1 tablet (20 mg total) by mouth daily. 11/17/22  Yes Anders Simmonds, PA-C  oxyCODONE-acetaminophen (PERCOCET) 10-325 MG tablet Take 1 tablet by mouth 3 (three) times daily as needed. 11/27/22  Yes [provider]  pantoprazole (PROTONIX) 20 MG tablet Take 1 tablet (20 mg total) by mouth daily. 11/17/22  Yes Anders Simmonds, PA-C  tamsulosin (FLOMAX) 0.4 MG CAPS capsule Take 1 capsule (0.4 mg total) by mouth daily after supper. 11/17/22  Yes Georgian Co M, PA-C  atorvastatin (LIPITOR) 80 MG tablet Take 1 tablet (80 mg total) by mouth daily. 11/17/22   Anders Simmonds, PA-C  cholecalciferol (VITAMIN D3) 25 MCG (1000 UNIT) tablet Take 1,000 Units by mouth daily.    [provider]  polyethylene glycol (MIRALAX / GLYCOLAX) 17  g packet Take by mouth. 07/31/20   [provider]  senna (SENOKOT) 8.6 MG tablet Take 1 tablet (8.6 mg total) by mouth daily as needed for constipation. 07/21/21   Marcine Matar, MD  sildenafil (VIAGRA) 100 MG tablet Take 1 tablet (100 mg total) by mouth daily as needed for erectile dysfunction. 08/10/22   Sondra Come, MD    Current Outpatient Medications  Medication Sig Dispense Refill   calcium carbonate (OS-CAL - DOSED IN MG OF ELEMENTAL CALCIUM) 1250 (500 Ca) MG tablet Take 1 tablet by mouth daily with breakfast.     cetirizine (ZYRTEC ALLERGY) 10 MG tablet Take 1 tablet (10 mg total) by mouth daily. 100 tablet 1    ferrous sulfate 325 (65 FE) MG EC tablet Take 1 tablet (325 mg total) by mouth daily with breakfast. 30 tablet 6   fluticasone (FLONASE) 50 MCG/ACT nasal spray Place 2 sprays into both nostrils daily. 9.9 mL 2   folic acid (FOLVITE) 1 MG tablet Take 1 tablet (1 mg total) by mouth daily. 30 tablet 6   gabapentin (NEURONTIN) 600 MG tablet Take 1 tablet (600 mg total) by mouth 3 (three) times daily. 270 tablet 0   meloxicam (MOBIC) 7.5 MG tablet Take 1 tablet (7.5 mg total) by mouth daily. Please make an appt 90 tablet 1   metFORMIN (GLUCOPHAGE-XR) 500 MG 24 hr tablet TAKE 1 TABLET BY MOUTH EVERY DAY WITH BREAKFAST 90 tablet 1   olmesartan (BENICAR) 20 MG tablet Take 1 tablet (20 mg total) by mouth daily. 90 tablet 1   oxyCODONE-acetaminophen (PERCOCET) 10-325 MG tablet Take 1 tablet by mouth 3 (three) times daily as needed.     pantoprazole (PROTONIX) 20 MG tablet Take 1 tablet (20 mg total) by mouth daily. 90 tablet 1   tamsulosin (FLOMAX) 0.4 MG CAPS capsule Take 1 capsule (0.4 mg total) by mouth daily after supper. 90 capsule 1   atorvastatin (LIPITOR) 80 MG tablet Take 1 tablet (80 mg total) by mouth daily. 90 tablet 3   cholecalciferol (VITAMIN D3) 25 MCG (1000 UNIT) tablet Take 1,000 Units by mouth daily.     polyethylene glycol (MIRALAX / GLYCOLAX) 17 g packet Take by mouth.     senna (SENOKOT) 8.6 MG tablet Take 1 tablet (8.6 mg total) by mouth daily as needed for constipation. 30 tablet 3   sildenafil (VIAGRA) 100 MG tablet Take 1 tablet (100 mg total) by mouth daily as needed for erectile dysfunction. 30 tablet 11   Current Facility-Administered Medications  Medication Dose Route Frequency Provider Last Rate Last Admin   0.9 %  sodium chloride infusion  500 mL Intravenous Once Napoleon Form, MD        Allergies as of 12/15/2022   (No Known Allergies)    Family History  Problem Relation Age of Onset   Diabetes Mother    Hypertension Mother    Other Father        murdered    Diabetes Sister    Arthritis Sister    Heart disease Sister    Diabetes Sister    Diabetes Sister    Cancer Neg Hx    Stroke Neg Hx    Prostate cancer Neg Hx        unknown   Bladder Cancer Neg Hx        unkown   Kidney cancer Neg Hx        unkown    Social History   Socioeconomic  History   Marital status: Divorced    Spouse name: Not on file   Number of children: 3   Years of education: Not on file   Highest education level: Not on file  Occupational History   Occupation: switcher/ spotter/forklift  Tobacco Use   Smoking status: Some Days    Types: Cigarettes    Passive exposure: Current   Smokeless tobacco: Never  Vaping Use   Vaping Use: Never used  Substance and Sexual Activity   Alcohol use: Yes    Comment: occasional beer   Drug use: No   Sexual activity: Yes  Other Topics Concern   Not on file  Social History Narrative   Lives with his sister.   Works as a Engineer, civil (consulting) in [employer], [employer].   Exercise - active on the job.  Has 3 children in [place].   Social Determinants of Health   Financial Resource Strain: Not on file  Food Insecurity: Not on file  Transportation Needs: Not on file  Physical Activity: Not on file  Stress: Not on file  Social Connections: Not on file  Intimate Partner Violence: Not on file    Review of Systems:  All other review of systems negative except as mentioned in the HPI.  Physical Exam: Vital signs in last 24 hours: Temperature 98.9 F (37.2 C)  General:   Alert, NAD Lungs:  Clear .   Heart:  Regular rate and rhythm Abdomen:  Soft, nontender and nondistended. Neuro/Psych:  Alert and cooperative. Normal mood and affect. A and O x 3  Reviewed labs, radiology imaging, old records and pertinent past GI work up  Patient is appropriate for planned procedure(s) and anesthesia in an ambulatory setting   K. Scherry Ran , MD [phone]

## 2022-12-16 ENCOUNTER — Telehealth: Payer: Self-pay | Admitting: *Deleted

## 2022-12-16 NOTE — Telephone Encounter (Signed)
Attempted to call patient for their post-procedure follow-up call. No answer. Unable to leave voicemail due to full mailbox. If you have any questions or concerns following your procedure at [facility] from 12/15/22, please don't hesitate to call our office. Thank you!

## 2022-12-20 ENCOUNTER — Telehealth: Payer: Self-pay | Admitting: *Deleted

## 2022-12-20 NOTE — Telephone Encounter (Signed)
Faxed reports today

## 2022-12-20 NOTE — Telephone Encounter (Signed)
Per patients colonoscopy report patient needs a referral to [facility] for colorectal surgery for management of anal fistula  Just received report today will fax report today

## 2022-12-23 NOTE — Telephone Encounter (Signed)
Called patient and he has an appointment scheduled for 7/8 at [facility]. Will call us if he needs anything

## 2022-12-28 ENCOUNTER — Telehealth: Payer: Self-pay | Admitting: Internal Medicine

## 2022-12-28 NOTE — Telephone Encounter (Signed)
Contacted Matthew Flaming Sr. to schedule their annual wellness visit. Welcome to Medicare visit Due by 05/06/2023.  Thank you,  [facility] Support [facility] Direct dial  [phone]

## 2023-01-10 ENCOUNTER — Ambulatory Visit: Payer: Self-pay | Admitting: General Surgery

## 2023-01-10 NOTE — H&P (Signed)
REFERRING PHYSICIAN:  Unk Lightning,*  PROVIDER:  Elenora Gamma, MD  MRN: [id number] DOB: July 19, 1955 DATE OF ENCOUNTER: 01/10/2023  Subjective   Chief Complaint: New Consultation (Eval of anal fistula)     History of Present Illness: Matthew Heisser Sr. is a 67 y.o. male who is seen today as an office consultation at the request of PA Zerita Boers for evaluation of New Consultation (Eval of anal fistula) .  Patient presented to his gastroenterology office with complaints of rectal pain and pressure over the past few months which radiates to his prostate and worse after bowel movements.  Denies any rectal bleeding.  No change in bowel habits.  He does state that he had a history of an abscess on the right side several years ago.  He underwent an MRI in May as well and this showed a simple right-sided posterior superficial anal fistula.  He also underwent a flexible sigmoidoscopy which showed no acute findings to suggest an etiology for the rectal pain.       Review of Systems: A complete review of systems was obtained from the patient.  I have reviewed this information and discussed as appropriate with the patient.  See HPI as well for other ROS.   Medical History: Past Medical History:  Diagnosis Date   Arthritis    Diabetes mellitus without complication (CMS/HHS-HCC)    History of cancer    Hyperlipidemia     There is no problem list on file for this patient.   No past surgical history on file.   No Known Allergies  Current Outpatient Medications on File Prior to Visit  Medication Sig Dispense Refill   atorvastatin (LIPITOR) 80 MG tablet Take 80 mg by mouth once daily     calcium carbonate 500 mg calcium (1,250 mg) tablet Take 1 tablet by mouth daily with breakfast     cetirizine (ZYRTEC) 10 MG tablet Take 1 tablet by mouth once daily     cholecalciferol (VITAMIN D3) 1000 unit tablet Take 1,000 Units by mouth once daily     ferrous sulfate 325 (65 FE) MG  EC tablet Take 325 mg by mouth daily with breakfast     fluticasone propionate (FLONASE) 50 mcg/actuation nasal spray Place 2 sprays into one nostril once daily     folic acid (FOLVITE) 1 MG tablet Take 1,000 mcg by mouth once daily     gabapentin (NEURONTIN) 600 MG tablet Take 1 tablet by mouth 3 (three) times daily     meloxicam (MOBIC) 15 MG tablet Take 15 mg by mouth once daily     metFORMIN (GLUCOPHAGE-XR) 500 MG XR tablet TAKE 1 TABLET BY MOUTH IN THE MORNING AND TAKE 1 TABLET BY MOUTH AT BEDTIME     olmesartan (BENICAR) 20 MG tablet Take 20 mg by mouth once daily     oxyCODONE-acetaminophen (PERCOCET) 10-325 mg tablet 1 1/2 TABLET BY MOUTH THREE TIMES DAILY, AS NEEDED     pantoprazole (PROTONIX) 20 MG DR tablet Take 1 tablet by mouth once daily     polyethylene glycol (MIRALAX) packet Take 17 g by mouth once daily     sennosides (SENOKOT) 8.6 mg tablet Take by mouth     sildenafiL (VIAGRA) 100 MG tablet Take 100 mg by mouth once daily as needed     tamsulosin (FLOMAX) 0.4 mg capsule Take 1 capsule by mouth once daily     No current facility-administered medications on file prior to visit.    Family History  Problem Relation Age of Onset   High blood pressure (Hypertension) Mother    Diabetes Mother    High blood pressure (Hypertension) Sister    Diabetes Sister      Social History   Tobacco Use  Smoking Status Every Day   Types: Cigarettes  Smokeless Tobacco Never     Social History   Socioeconomic History   Marital status: Divorced  Tobacco Use   Smoking status: Every Day    Types: Cigarettes   Smokeless tobacco: Never  Vaping Use   Vaping status: Never Used  Substance and Sexual Activity   Alcohol use: Yes   Drug use: Never    Objective:    Vitals:   01/10/23 1131  BP: 126/78  Pulse: 92  Temp: 36.3 C (97.4 F)  SpO2: 97%  Weight: (!) 125 kg (275 lb 9.6 oz)  Height: 177.8 cm (5\' 10" )  PainSc: 0-No pain     Exam Gen: NAD Abd: soft Rectal: Right  posterior external opening   Labs, Imaging and Diagnostic Testing: MRI report and images reviewed.  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  This appears superficial on MRI.  We discussed proceeding with exam under anesthesia and possible fistulotomy versus seton placement.  We have discussed both of these in detail including risk of incontinence if too much of a fistulotomy was performed.  We discussed the need for additional surgery if a seton is placed.  All questions were answered.  We discussed the possibility that this surgery would not fix his rectal pain.  Vanita Panda, MD Colon and Rectal Surgery [facility]

## 2023-01-26 ENCOUNTER — Other Ambulatory Visit: Payer: Self-pay

## 2023-01-26 ENCOUNTER — Encounter (HOSPITAL_BASED_OUTPATIENT_CLINIC_OR_DEPARTMENT_OTHER): Payer: Self-pay | Admitting: General Surgery

## 2023-01-26 NOTE — Progress Notes (Signed)
Spoke w/ via phone for pre-op interview---pt Lab needs dos----   istat, ekg            Lab results------none COVID test -----patient states asymptomatic no test needed Arrive at -------530 am 02-11-2023 NPO after MN NO Solid Food.  Clear liquids from MN until---430 am Med rec completed Medications to take morning of surgery -----gabapentin, pantoprazole, atorvastatin, flonase, oxycodone prn, atorvastatin, zyrtec Diabetic medication -----none day of surgery Patient instructed no nail polish to be worn day of surgery Patient instructed to bring photo id and insurance card day of surgery Patient aware to have Driver (ride ) / caregiver    for 24 hours after surgery  Patient Special Instructions -----none Pre-Op special Instructions -----none Patient verbalized understanding of instructions that were given at this phone interview. Patient denies shortness of breath, chest pain, fever, cough at this phone interview.

## 2023-02-11 ENCOUNTER — Ambulatory Visit (HOSPITAL_BASED_OUTPATIENT_CLINIC_OR_DEPARTMENT_OTHER): Payer: Medicare Other | Admitting: Anesthesiology

## 2023-02-11 ENCOUNTER — Encounter (HOSPITAL_BASED_OUTPATIENT_CLINIC_OR_DEPARTMENT_OTHER): Payer: Self-pay | Admitting: General Surgery

## 2023-02-11 ENCOUNTER — Other Ambulatory Visit: Payer: Self-pay

## 2023-02-11 ENCOUNTER — Encounter (HOSPITAL_BASED_OUTPATIENT_CLINIC_OR_DEPARTMENT_OTHER)
Admission: RE | Disposition: A | Discharge status: Home / Self Care | Payer: Self-pay | Source: Home / Self Care | Attending: General Surgery

## 2023-02-11 ENCOUNTER — Ambulatory Visit (HOSPITAL_BASED_OUTPATIENT_CLINIC_OR_DEPARTMENT_OTHER)
Admission: RE | Admit: 2023-02-11 | Discharge: 2023-02-11 | Disposition: A | Discharge status: Home / Self Care | Payer: Medicare Other | Source: Home / Self Care | Attending: General Surgery | Admitting: General Surgery

## 2023-02-11 DIAGNOSIS — Z8249 Family history of ischemic heart disease and other diseases of the circulatory system: Secondary | ICD-10-CM | POA: Insufficient documentation

## 2023-02-11 DIAGNOSIS — E119 Type 2 diabetes mellitus without complications: Secondary | ICD-10-CM | POA: Insufficient documentation

## 2023-02-11 DIAGNOSIS — Z833 Family history of diabetes mellitus: Secondary | ICD-10-CM | POA: Diagnosis not present

## 2023-02-11 DIAGNOSIS — I10 Essential (primary) hypertension: Secondary | ICD-10-CM | POA: Insufficient documentation

## 2023-02-11 DIAGNOSIS — K219 Gastro-esophageal reflux disease without esophagitis: Secondary | ICD-10-CM | POA: Diagnosis not present

## 2023-02-11 DIAGNOSIS — Z01818 Encounter for other preprocedural examination: Secondary | ICD-10-CM

## 2023-02-11 DIAGNOSIS — K603 Anal fistula: Secondary | ICD-10-CM | POA: Diagnosis present

## 2023-02-11 DIAGNOSIS — Z7984 Long term (current) use of oral hypoglycemic drugs: Secondary | ICD-10-CM | POA: Insufficient documentation

## 2023-02-11 DIAGNOSIS — F17210 Nicotine dependence, cigarettes, uncomplicated: Secondary | ICD-10-CM | POA: Insufficient documentation

## 2023-02-11 DIAGNOSIS — M1990 Unspecified osteoarthritis, unspecified site: Secondary | ICD-10-CM | POA: Insufficient documentation

## 2023-02-11 HISTORY — PX: FISTULOTOMY: SHX6413

## 2023-02-11 HISTORY — DX: Presence of dental prosthetic device (complete) (partial): Z97.2

## 2023-02-11 HISTORY — DX: Anal fistula, unspecified: K60.30

## 2023-02-11 HISTORY — DX: Anal fistula: K60.3

## 2023-02-11 LAB — GLUCOSE, CAPILLARY
Glucose-Capillary: 76 mg/dL (ref 70–99)
Glucose-Capillary: 90 mg/dL (ref 70–99)

## 2023-02-11 SURGERY — FISTULOTOMY
Anesthesia: General | Site: Anus

## 2023-02-11 MED ORDER — FENTANYL CITRATE (PF) 100 MCG/2ML IJ SOLN
INTRAMUSCULAR | Status: DC | PRN
  Administered 2023-02-11: 100 ug via INTRAVENOUS

## 2023-02-11 MED ORDER — LIDOCAINE HCL (PF) 2 % IJ SOLN
INTRAMUSCULAR | Status: AC
  Filled 2023-02-11: qty 5

## 2023-02-11 MED ORDER — OXYCODONE HCL 5 MG PO TABS
ORAL_TABLET | ORAL | Status: AC
  Filled 2023-02-11: qty 1

## 2023-02-11 MED ORDER — MIDAZOLAM HCL 2 MG/2ML IJ SOLN: 1.0000 mg | INTRAMUSCULAR | Status: DC | PRN

## 2023-02-11 MED ORDER — LACTATED RINGERS IV SOLN: INTRAVENOUS | Status: DC | PRN

## 2023-02-11 MED ORDER — SUCCINYLCHOLINE CHLORIDE 200 MG/10ML IV SOSY
PREFILLED_SYRINGE | INTRAVENOUS | Status: DC | PRN
  Administered 2023-02-11: 100 mg via INTRAVENOUS

## 2023-02-11 MED ORDER — 0.9 % SODIUM CHLORIDE (POUR BTL) OPTIME
TOPICAL | Status: DC | PRN
  Administered 2023-02-11: 500 mL

## 2023-02-11 MED ORDER — PROPOFOL 10 MG/ML IV BOLUS
INTRAVENOUS | Status: AC
  Filled 2023-02-11: qty 20

## 2023-02-11 MED ORDER — LIDOCAINE 2% (20 MG/ML) 5 ML SYRINGE
INTRAMUSCULAR | Status: DC | PRN
  Administered 2023-02-11: 2 mL via INTRAVENOUS

## 2023-02-11 MED ORDER — LACTATED RINGERS IV SOLN: INTRAVENOUS | Status: DC

## 2023-02-11 MED ORDER — MIDAZOLAM HCL 2 MG/2ML IJ SOLN
INTRAMUSCULAR | Status: AC
  Filled 2023-02-11: qty 2

## 2023-02-11 MED ORDER — ACETAMINOPHEN 10 MG/ML IV SOLN: 1000.0000 mg | Freq: Once | INTRAVENOUS | Status: DC | PRN

## 2023-02-11 MED ORDER — ROCURONIUM BROMIDE 10 MG/ML (PF) SYRINGE
PREFILLED_SYRINGE | INTRAVENOUS | Status: AC
  Filled 2023-02-11: qty 10

## 2023-02-11 MED ORDER — ACETAMINOPHEN 500 MG PO TABS
1000.0000 mg | ORAL_TABLET | ORAL | Status: AC
  Administered 2023-02-11: 1000 mg via ORAL

## 2023-02-11 MED ORDER — MIDAZOLAM HCL 2 MG/2ML IJ SOLN
INTRAMUSCULAR | Status: DC | PRN
  Administered 2023-02-11: 2 mg via INTRAVENOUS

## 2023-02-11 MED ORDER — ONDANSETRON HCL 4 MG/2ML IJ SOLN
INTRAMUSCULAR | Status: DC | PRN
  Administered 2023-02-11: 4 mg via INTRAVENOUS

## 2023-02-11 MED ORDER — BUPIVACAINE-EPINEPHRINE 0.5% -1:200000 IJ SOLN: INTRAMUSCULAR | Status: DC | PRN

## 2023-02-11 MED ORDER — SODIUM CHLORIDE 0.9% FLUSH: 3.0000 mL | Freq: Two times a day (BID) | INTRAVENOUS | Status: DC

## 2023-02-11 MED ORDER — OXYCODONE HCL 5 MG/5ML PO SOLN: 5.0000 mg | Freq: Once | ORAL | Status: AC | PRN

## 2023-02-11 MED ORDER — OXYCODONE HCL 5 MG PO TABS
5.0000 mg | ORAL_TABLET | Freq: Once | ORAL | Status: AC | PRN
  Administered 2023-02-11: 5 mg via ORAL

## 2023-02-11 MED ORDER — PROPOFOL 10 MG/ML IV BOLUS
INTRAVENOUS | Status: DC | PRN
Start: 2023-02-11 — End: 2023-02-11
  Administered 2023-02-11: 170 mg via INTRAVENOUS

## 2023-02-11 MED ORDER — FENTANYL CITRATE (PF) 100 MCG/2ML IJ SOLN
INTRAMUSCULAR | Status: AC
  Filled 2023-02-11: qty 2

## 2023-02-11 MED ORDER — BUPIVACAINE-EPINEPHRINE 0.25% -1:200000 IJ SOLN
INTRAMUSCULAR | Status: DC | PRN
Start: 2023-02-11 — End: 2023-02-11
  Administered 2023-02-11: 30 mL

## 2023-02-11 MED ORDER — FENTANYL CITRATE (PF) 100 MCG/2ML IJ SOLN: 25.0000 ug | INTRAMUSCULAR | Status: DC | PRN

## 2023-02-11 MED ORDER — OXYCODONE HCL 5 MG PO TABS: 5.0000 mg | ORAL_TABLET | Freq: Four times a day (QID) | ORAL | 0 refills | Status: AC | PRN

## 2023-02-11 MED ORDER — ONDANSETRON HCL 4 MG/2ML IJ SOLN: 4.0000 mg | Freq: Once | INTRAMUSCULAR | Status: DC | PRN

## 2023-02-11 MED ORDER — ACETAMINOPHEN 500 MG PO TABS
ORAL_TABLET | ORAL | Status: AC
  Filled 2023-02-11: qty 2

## 2023-02-11 SURGICAL SUPPLY — 53 items
APL SKNCLS STERI-STRIP NONHPOA (GAUZE/BANDAGES/DRESSINGS) ×1
BENZOIN TINCTURE PRP APPL 2/3 (GAUZE/BANDAGES/DRESSINGS) ×2 IMPLANT
BLADE EXTENDED COATED 6.5IN (ELECTRODE) IMPLANT
BLADE SURG 10 STRL SS (BLADE) IMPLANT
BRIEF MESH DISP LRG (UNDERPADS AND DIAPERS) ×1 IMPLANT
COVER BACK TABLE 60X90IN (DRAPES) ×1 IMPLANT
COVER MAYO STAND STRL (DRAPES) ×1 IMPLANT
DRAPE HYSTEROSCOPY (MISCELLANEOUS) IMPLANT
DRAPE LAPAROTOMY 100X72 PEDS (DRAPES) ×1 IMPLANT
DRAPE SHEET LG 3/4 BI-LAMINATE (DRAPES) IMPLANT
DRAPE UTILITY XL STRL (DRAPES) ×1 IMPLANT
ELECT REM PT RETURN 9FT ADLT (ELECTROSURGICAL) ×1
ELECTRODE REM PT RTRN 9FT ADLT (ELECTROSURGICAL) ×1 IMPLANT
GAUZE 4X4 16PLY ~~LOC~~+RFID DBL (SPONGE) ×1 IMPLANT
GAUZE PAD ABD 8X10 STRL (GAUZE/BANDAGES/DRESSINGS) ×1 IMPLANT
GAUZE SPONGE 4X4 12PLY STRL (GAUZE/BANDAGES/DRESSINGS) ×1 IMPLANT
GLOVE BIO SURGEON STRL SZ 6.5 (GLOVE) ×1 IMPLANT
GLOVE INDICATOR 6.5 STRL GRN (GLOVE) ×1 IMPLANT
GOWN STRL REUS W/TWL XL LVL3 (GOWN DISPOSABLE) ×1 IMPLANT
HYDROGEN PEROXIDE 16OZ (MISCELLANEOUS) ×1 IMPLANT
IV CATH 14GX2 1/4 (CATHETERS) ×1 IMPLANT
IV CATH 18G SAFETY (IV SOLUTION) ×1 IMPLANT
KIT SIGMOIDOSCOPE (SET/KITS/TRAYS/PACK) IMPLANT
KIT TURNOVER CYSTO (KITS) ×1 IMPLANT
LEGGING LITHOTOMY PAIR STRL (DRAPES) IMPLANT
LOOP VASCLR MAXI BLUE 18IN ST (MISCELLANEOUS) IMPLANT
LOOP VASCULAR MAXI 18 BLUE (MISCELLANEOUS)
LOOPS VASCLR MAXI BLUE 18IN ST (MISCELLANEOUS) IMPLANT
NDL HYPO 22X1.5 SAFETY MO (MISCELLANEOUS) ×1 IMPLANT
NEEDLE HYPO 22X1.5 SAFETY MO (MISCELLANEOUS) ×1 IMPLANT
NS IRRIG 500ML POUR BTL (IV SOLUTION) ×1 IMPLANT
PACK BASIN DAY SURGERY FS (CUSTOM PROCEDURE TRAY) ×1 IMPLANT
PAD ARMBOARD 7.5X6 YLW CONV (MISCELLANEOUS) IMPLANT
PENCIL SMOKE EVACUATOR (MISCELLANEOUS) ×1 IMPLANT
SLEEVE SCD COMPRESS KNEE MED (STOCKING) ×1 IMPLANT
SPIKE FLUID TRANSFER (MISCELLANEOUS) ×1 IMPLANT
SPONGE HEMORRHOID 8X3CM (HEMOSTASIS) IMPLANT
SPONGE SURGIFOAM ABS GEL 12-7 (HEMOSTASIS) IMPLANT
SUCTION TUBE FRAZIER 10FR DISP (SUCTIONS) IMPLANT
SUT CHROMIC 2 0 SH (SUTURE) IMPLANT
SUT CHROMIC 3 0 SH 27 (SUTURE) IMPLANT
SUT ETHIBOND 0 (SUTURE) IMPLANT
SUT VIC AB 2-0 SH 27 (SUTURE)
SUT VIC AB 2-0 SH 27XBRD (SUTURE) IMPLANT
SUT VIC AB 3-0 SH 18 (SUTURE) IMPLANT
SUT VIC AB 3-0 SH 27 (SUTURE) ×1
SUT VIC AB 3-0 SH 27XBRD (SUTURE) IMPLANT
SYR CONTROL 10ML LL (SYRINGE) ×1 IMPLANT
TOWEL OR 17X24 6PK STRL BLUE (TOWEL DISPOSABLE) ×1 IMPLANT
TRAY DSU PREP LF (CUSTOM PROCEDURE TRAY) ×1 IMPLANT
TUBE CONNECTING 12X1/4 (SUCTIONS) ×1 IMPLANT
VASCULAR TIE MAXI BLUE 18IN ST (MISCELLANEOUS)
YANKAUER SUCT BULB TIP NO VENT (SUCTIONS) ×1 IMPLANT

## 2023-02-11 NOTE — Discharge Instructions (Addendum)
ANORECTAL SURGERY: POST OP INSTRUCTIONS Take your usually prescribed home medications unless otherwise directed. DIET: During the first few hours after surgery sip on some liquids until you are able to urinate.  It is normal to not urinate for several hours after this surgery.  If you feel uncomfortable, please contact the office for instructions.  After you are able to urinate,you may eat, if you feel like it.  Follow a light bland diet the first 24 hours after arrival home, such as soup, liquids, crackers, etc.  Be sure to include lots of fluids daily (6-8 glasses).  Avoid fast food or heavy meals, as your are more likely to get nauseated.  Eat a low fat diet the next few days after surgery.  Limit caffeine intake to 1-2 servings a day. PAIN CONTROL: Pain is best controlled by a usual combination of several different methods TOGETHER: Muscle relaxation: Soak in a warm bath (or Sitz bath) three times a day and after bowel movements.  Continue to do this until all pain is resolved. Over the counter pain medication Prescription pain medication Most patients will experience some swelling and discomfort in the anus/rectal area and incisions.  Heat such as warm towels, sitz baths, warm baths, etc to help relax tight/sore spots and speed recovery.  Some people prefer to use ice, especially in the first couple days after surgery, as it may decrease the pain and swelling, or alternate between ice & heat.  Experiment to what works for you.  Swelling and bruising can take several weeks to resolve.  Pain can take even longer to completely resolve. It is helpful to take an over-the-counter pain medication regularly for the first few weeks.  Choose one of the following that works best for you: Naproxen (Aleve, etc)  Two 220mg  tabs twice a day Ibuprofen (Advil, etc) Three 200mg  tabs four times a day (every meal & bedtime) A  prescription for pain medication (such as percocet, oxycodone, hydrocodone, etc) should be  given to you upon discharge.  Take your pain medication as prescribed.  If you are having problems/concerns with the prescription medicine (does not control pain, nausea, vomiting, rash, itching, etc), please call us [phone] to see if we need to switch you to a different pain medicine that will work better for you and/or control your side effect better. If you need a refill on your pain medication, please contact your pharmacy.  They will contact our office to request authorization. Prescriptions will not be filled after 5 pm or on week-ends. KEEP YOUR BOWELS REGULAR and AVOID CONSTIPATION The goal is one to two soft bowel movements a day.  You should at least have a bowel movement every other day. Avoid getting constipated.  Between the surgery and the pain medications, it is common to experience some constipation. This can be very painful after rectal surgery.  Increasing fluid intake and taking a fiber supplement (such as Metamucil, Citrucel, FiberCon, etc) 1-2 times a day regularly will usually help prevent this problem from occurring.  A stool softener like colace is also recommended.  This can be purchased over the counter at your pharmacy.  You can take it up to 3 times a day.  If you do not have a bowel movement after 24 hrs since your surgery, take one does of milk of magnesia.  If you still haven't had a bowel movement 8-12 hours after that dose, take another dose.  If you don't have a bowel movement 48 hrs after surgery,  purchase a Fleets enema from the drug store and administer gently per package instructions.  If you still are having trouble with your bowel movements after that, please call the office for further instructions. If you develop diarrhea or have many loose bowel movements, simplify your diet to bland foods & liquids for a few days.  Stop any stool softeners and decrease your fiber supplement.  Switching to mild anti-diarrheal medications (Kayopectate, Pepto Bismol) can help.   If this worsens or does not improve, please call us.  Wound Care Remove your bandages before your first bowel movement or 8 hours after surgery.     Remove any wound packing material at this tim,e as well.  You do not need to repack the wound unless instructed otherwise.  Wear an absorbent pad or soft cotton gauze in your underwear to catch any drainage and help keep the area clean. You should change this every 2-3 hours while awake. Keep the area clean and dry.  Bathe / shower every day, especially after bowel movements.  Keep the area clean by showering / bathing over the incision / wound.   It is okay to soak an open wound to help wash it.  Wet wipes or showers / gentle washing after bowel movements is often less traumatic than regular toilet paper. You may have some styrofoam-like soft packing in the rectum which will come out with the first bowel movement.  You will often notice bleeding with bowel movements.  This should slow down by the end of the first week of surgery Expect some drainage.  This should slow down, too, by the end of the first week of surgery.  Wear an absorbent pad or soft cotton gauze in your underwear until the drainage stops. Do Not sit on a rubber or pillow ring.  This can make you symptoms worse.  You may sit on a soft pillow if needed.  ACTIVITIES as tolerated:   You may resume regular (light) daily activities beginning the next day--such as daily self-care, walking, climbing stairs--gradually increasing activities as tolerated.  If you can walk 30 minutes without difficulty, it is safe to try more intense activity such as jogging, treadmill, bicycling, low-impact aerobics, swimming, etc. Save the most intensive and strenuous activity for last such as sit-ups, heavy lifting, contact sports, etc  Refrain from any heavy lifting or straining until you are off narcotics for pain control.   You may drive when you are no longer taking prescription pain medication, you can  comfortably sit for long periods of time, and you can safely maneuver your car and apply brakes. You may have sexual intercourse when it is comfortable.  FOLLOW UP in our office Please call [facility] at [phone] to set up an appointment to see your surgeon in the office for a follow-up appointment approximately 3-4 weeks after your surgery. Make sure that you call for this appointment the day you arrive home to insure a convenient appointment time. 10. IF YOU HAVE DISABILITY OR FAMILY LEAVE FORMS, BRING THEM TO THE OFFICE FOR PROCESSING.  DO NOT GIVE THEM TO YOUR DOCTOR.     WHEN TO CALL us [phone]: Poor pain control Reactions / problems with new medications (rash/itching, nausea, etc)  Fever over 101.5 F (38.5 C) Inability to urinate Nausea and/or vomiting Worsening swelling or bruising Continued bleeding from incision. Increased pain, redness, or drainage from the incision  The clinic staff is available to answer your questions during regular business hours (8:30am-5pm).  Please don't hesitate to call and ask to speak to one of our nurses for clinical concerns.   A surgeon from [facility] is always on call at the hospitals   If you have a medical emergency, go to the nearest emergency room or call 911.    [facility] [address], [place], [place]  [address] ? MAIN: [phone] ? TOLL FREE: [phone] ? FAX [phone] [email or web address]    No acetaminophen/Tylenol until after 12:15pm today if needed for pain.    Post Anesthesia Home Care Instructions  Activity: Get plenty of rest for the remainder of the day. A responsible individual must stay with you for 24 hours following the procedure.  For the next 24 hours, DO NOT: -Drive a car -Advertising copywriter -Drink alcoholic beverages -Take any medication unless instructed by your physician -Make any legal decisions or sign important  papers.  Meals: Start with liquid foods such as gelatin or soup. Progress to regular foods as tolerated. Avoid greasy, spicy, heavy foods. If nausea and/or vomiting occur, drink only clear liquids until the nausea and/or vomiting subsides. Call your physician if vomiting continues.  Special Instructions/Symptoms: Your throat may feel dry or sore from the anesthesia or the breathing tube placed in your throat during surgery. If this causes discomfort, gargle with warm salt water. The discomfort should disappear within 24 hours.

## 2023-02-11 NOTE — Anesthesia Preprocedure Evaluation (Signed)
Anesthesia Evaluation  Patient identified by MRN, date of birth, ID band Patient awake    Reviewed: Allergy & Precautions, NPO status , Patient's Chart, lab work & pertinent test results, reviewed documented beta blocker date and time   History of Anesthesia Complications Negative for: history of anesthetic complications  Airway Mallampati: II  TM Distance: >3 FB     Dental no notable dental hx. (+) Partial Upper   Pulmonary neg sleep apnea, neg COPD, Current Smoker and Patient abstained from smoking.   breath sounds clear to auscultation       Cardiovascular hypertension, (-) angina (-) CAD, (-) Past MI, (-) Cardiac Stents and (-) CABG Normal cardiovascular exam(-) dysrhythmias (-) Valvular Problems/Murmurs Rhythm:Regular Rate:Normal     Neuro/Psych    GI/Hepatic ,GERD  ,,(+) neg Cirrhosis        Endo/Other  diabetes, Type 2    Renal/GU Renal disease     Musculoskeletal  (+) Arthritis ,    Abdominal   Peds  Hematology   Anesthesia Other Findings   Reproductive/Obstetrics                             Anesthesia Physical Anesthesia Plan  ASA: 2  Anesthesia Plan: General   Post-op Pain Management:    Induction: Intravenous  PONV Risk Score and Plan: 1 and Ondansetron and Dexamethasone  Airway Management Planned: Oral ETT  Additional Equipment:   Intra-op Plan:   Post-operative Plan: Extubation in OR  Informed Consent:      Dental advisory given  Plan Discussed with: CRNA and Surgeon  Anesthesia Plan Comments:        Anesthesia Quick Evaluation

## 2023-02-11 NOTE — Anesthesia Postprocedure Evaluation (Signed)
Anesthesia Post Note  Patient: Matthew Komisar Sr.  Procedure(s) Performed: INTERROGATION OF ANAL FISTULA WITH FISTULOTOMY (Anus)     Patient location during evaluation: PACU Anesthesia Type: General Level of consciousness: awake and alert Pain management: pain level controlled Vital Signs Assessment: post-procedure vital signs reviewed and stable Respiratory status: spontaneous breathing Cardiovascular status: blood pressure returned to baseline Postop Assessment: no headache Anesthetic complications: no   No notable events documented.  Last Vitals:  Vitals:   02/11/23 0900 02/11/23 0940  BP: 127/67 130/63  Pulse: 78 72  Resp: 13 15  Temp: 36.4 C 36.4 C  SpO2: 98% 100%    Last Pain:  Vitals:   02/11/23 0940  TempSrc:   PainSc: 4                  Mariann Barter

## 2023-02-11 NOTE — H&P (Signed)
REFERRING PHYSICIAN:  Unk Lightning,*   PROVIDER:  Elenora Gamma, MD   MRN: [id number] DOB: June 20, 1956    Subjective    Chief Complaint: New Consultation (Eval of anal fistula)       History of Present Illness: Matthew Angon Sr. is a 67 y.o. male who is seen today as an office consultation at the request of PA Zerita Boers for evaluation of New Consultation (Eval of anal fistula) .  Patient presented to his gastroenterology office with complaints of rectal pain and pressure over the past few months which radiates to his prostate and worse after bowel movements.  Denies any rectal bleeding.  No change in bowel habits.  He does state that he had a history of an abscess on the right side several years ago.  He underwent an MRI in May as well and this showed a simple right-sided posterior superficial anal fistula.  He also underwent a flexible sigmoidoscopy which showed no acute findings to suggest an etiology for the rectal pain.           Review of Systems: A complete review of systems was obtained from the patient.  I have reviewed this information and discussed as appropriate with the patient.  See HPI as well for other ROS.     Medical History:     Past Medical History:  Diagnosis Date   Arthritis     Diabetes mellitus without complication (CMS/HHS-HCC)     History of cancer     Hyperlipidemia        There is no problem list on file for this patient.     No past surgical history on file.    No Known Allergies         Current Outpatient Medications on File Prior to Visit  Medication Sig Dispense Refill   atorvastatin (LIPITOR) 80 MG tablet Take 80 mg by mouth once daily       calcium carbonate 500 mg calcium (1,250 mg) tablet Take 1 tablet by mouth daily with breakfast       cetirizine (ZYRTEC) 10 MG tablet Take 1 tablet by mouth once daily       cholecalciferol (VITAMIN D3) 1000 unit tablet Take 1,000 Units by mouth once daily       ferrous sulfate  325 (65 FE) MG EC tablet Take 325 mg by mouth daily with breakfast       fluticasone propionate (FLONASE) 50 mcg/actuation nasal spray Place 2 sprays into one nostril once daily       folic acid (FOLVITE) 1 MG tablet Take 1,000 mcg by mouth once daily       gabapentin (NEURONTIN) 600 MG tablet Take 1 tablet by mouth 3 (three) times daily       meloxicam (MOBIC) 15 MG tablet Take 15 mg by mouth once daily       metFORMIN (GLUCOPHAGE-XR) 500 MG XR tablet TAKE 1 TABLET BY MOUTH IN THE MORNING AND TAKE 1 TABLET BY MOUTH AT BEDTIME       olmesartan (BENICAR) 20 MG tablet Take 20 mg by mouth once daily       oxyCODONE-acetaminophen (PERCOCET) 10-325 mg tablet 1 1/2 TABLET BY MOUTH THREE TIMES DAILY, AS NEEDED       pantoprazole (PROTONIX) 20 MG DR tablet Take 1 tablet by mouth once daily       polyethylene glycol (MIRALAX) packet Take 17 g by mouth once daily  sennosides (SENOKOT) 8.6 mg tablet Take by mouth       sildenafiL (VIAGRA) 100 MG tablet Take 100 mg by mouth once daily as needed       tamsulosin (FLOMAX) 0.4 mg capsule Take 1 capsule by mouth once daily        No current facility-administered medications on file prior to visit.           Family History  Problem Relation Age of Onset   High blood pressure (Hypertension) Mother     Diabetes Mother     High blood pressure (Hypertension) Sister     Diabetes Sister        Social History        Tobacco Use  Smoking Status Every Day   Types: Cigarettes  Smokeless Tobacco Never      Social History         Socioeconomic History   Marital status: Divorced  Tobacco Use   Smoking status: Every Day      Types: Cigarettes   Smokeless tobacco: Never  Vaping Use   Vaping status: Never Used  Substance and Sexual Activity   Alcohol use: Yes   Drug use: Never      Objective:      Vitals:   02/11/23 0607  BP: (!) 156/77  Pulse: 65  Resp: 17  Temp: (!) 97.4 F (36.3 C)  SpO2: 97%    Exam Gen: NAD CV: RRR Lungs:  CTA Abd: soft Rectal: Right posterior external opening     Labs, Imaging and Diagnostic Testing: MRI report and images reviewed.   Assessment and Plan:  Diagnoses and all orders for this visit:   Anal fistula       67 year old male with right posterior anal fistula.  This appears superficial on MRI.  We discussed proceeding with exam under anesthesia and possible fistulotomy versus seton placement.  We have discussed both of these in detail including risk of incontinence if too much of a fistulotomy was performed.  We discussed the need for additional surgery if a seton is placed.  All questions were answered.  We discussed the possibility that this surgery would not fix his rectal pain.   Vanita Panda, MD Colon and Rectal Surgery [facility]

## 2023-02-11 NOTE — Op Note (Signed)
02/11/2023  8:09 AM  PATIENT:  Matthew Flaming Sr.  66 y.o. male  Patient Care Team: Marcine Matar, MD as PCP - General (Internal Medicine) Margaretmary Dys, MD as Consulting Physician (Radiation Oncology) Sondra Come, MD as Consulting Physician (Urology) Maryclare Labrador, RN as Registered Nurse Leonides Schanz Thereasa Distance, MD as Consulting Physician (Hematology and Oncology)  PRE-OPERATIVE DIAGNOSIS:  ANAL FISTULA  POST-OPERATIVE DIAGNOSIS:  ANAL FISTULA  PROCEDURE: INTERROGATION OF ANAL FISTULA WITH FISTULOTOMY   Surgeon(s): Romie Levee, MD  ASSISTANT: none   ANESTHESIA:   local and general  SPECIMEN:  No Specimen  DISPOSITION OF SPECIMEN:  N/A  COUNTS:  YES  PLAN OF CARE: Discharge to home after PACU  PATIENT DISPOSITION:  PACU - hemodynamically stable.  INDICATION: 67 y.o. M with anal fistula   OR FINDINGS: R post. trans-sphincteric fistula involving external sphincter only  DESCRIPTION: the patient was identified in the preoperative holding area and taken to the OR where they were laid on the operating room table.  General anesthesia was induced without difficulty. The patient was then positioned in prone jackknife position with buttocks gently taped apart.  The patient was then prepped and draped in usual sterile fashion.  SCDs were noted to be in place prior to the initiation of anesthesia. A surgical timeout was performed indicating the correct patient, procedure, positioning and need for preoperative antibiotics.  A rectal block was performed using Marcaine with epinephrine.    I began with a digital rectal exam.  The patient had some mild sphincter hypertension.  I then placed a Hill-Ferguson anoscope into the anal canal and evaluated this completely.  The fistula was noted internally distal to the dentate line.  I placed a fistula probe through this and confirmed external sphincter involvement only.  I completed the fistulotomy with cautery.  The sphincter was  tacked the fistula tract using interrupted 3-0 Vicryl sutures.  The edges of the fistulotomy site were marsupialized using a running 2-0 Chromic suture.  Additional Marcaine was applied around the wound.  A dressing was then applied.  The patient was awakened from anesthesia and sent to the PACU in stable condition.  All counts were correct per OR staff.    Vanita Panda, MD  Colorectal and General Surgery [facility]

## 2023-02-11 NOTE — Transfer of Care (Signed)
Immediate Anesthesia Transfer of Care Note  Patient: Matthew Flaming Sr.  Procedure(s) Performed: Procedure(s) (LRB): INTERROGATION OF ANAL FISTULA WITH FISTULOTOMY (N/A)  Patient Location: PACU  Anesthesia Type: General  Level of Consciousness: awake, oriented, sedated and patient cooperative  Airway & Oxygen Therapy: Patient Spontanous Breathing and Patient connected to face mask oxygen  Post-op Assessment: Report given to PACU RN and Post -op Vital signs reviewed and stable  Post vital signs: Reviewed and stable  Complications: No apparent anesthesia complications Last Vitals:  Vitals Value Taken Time  BP 111/54 02/11/23 0820  Temp    Pulse 90 02/11/23 0823  Resp 17 02/11/23 0823  SpO2 100 % 02/11/23 0823  Vitals shown include unfiled device data.  Last Pain:  Vitals:   02/11/23 0607  TempSrc: Oral  PainSc: 0-No pain         Complications: No notable events documented.

## 2023-02-11 NOTE — Anesthesia Procedure Notes (Signed)
Procedure Name: Intubation Date/Time: 02/11/2023 7:42 AM  Performed by: Francie Massing, CRNAPre-anesthesia Checklist: Patient identified, Emergency Drugs available, Suction available and Patient being monitored Patient Re-evaluated:Patient Re-evaluated prior to induction Oxygen Delivery Method: Circle system utilized Preoxygenation: Pre-oxygenation with 100% oxygen Induction Type: IV induction Ventilation: Mask ventilation without difficulty Laryngoscope Size: Miller and 2 Grade View: Grade I Tube type: Oral Tube size: 7.0 mm Number of attempts: 2 Airway Equipment and Method: Stylet and Oral airway Placement Confirmation: ETT inserted through vocal cords under direct vision, positive ETCO2 and breath sounds checked- equal and bilateral Secured at: 22 cm Tube secured with: Tape Dental Injury: Teeth and Oropharynx as per pre-operative assessment  Comments: DL x 2 - successful intubation with Hyacinth Meeker 2

## 2023-02-14 ENCOUNTER — Encounter (HOSPITAL_BASED_OUTPATIENT_CLINIC_OR_DEPARTMENT_OTHER): Payer: Self-pay | Admitting: General Surgery

## 2023-02-22 ENCOUNTER — Other Ambulatory Visit: Payer: Self-pay

## 2023-02-28 ENCOUNTER — Telehealth: Payer: Self-pay | Admitting: Hematology and Oncology

## 2023-03-10 ENCOUNTER — Other Ambulatory Visit: Payer: Self-pay | Admitting: Physician Assistant

## 2023-03-10 DIAGNOSIS — E1169 Type 2 diabetes mellitus with other specified complication: Secondary | ICD-10-CM

## 2023-03-10 DIAGNOSIS — E1159 Type 2 diabetes mellitus with other circulatory complications: Secondary | ICD-10-CM

## 2023-03-10 DIAGNOSIS — E1142 Type 2 diabetes mellitus with diabetic polyneuropathy: Secondary | ICD-10-CM

## 2023-03-11 ENCOUNTER — Ambulatory Visit: Payer: Medicare Other | Admitting: Hematology and Oncology

## 2023-03-11 ENCOUNTER — Inpatient Hospital Stay: Payer: Medicare Other | Attending: Hematology and Oncology

## 2023-03-11 ENCOUNTER — Other Ambulatory Visit: Payer: Self-pay | Admitting: Hematology and Oncology

## 2023-03-11 ENCOUNTER — Other Ambulatory Visit: Payer: Medicare Other

## 2023-03-11 ENCOUNTER — Ambulatory Visit: Payer: Medicare Other

## 2023-03-11 ENCOUNTER — Encounter: Payer: Self-pay | Admitting: Hematology and Oncology

## 2023-03-11 ENCOUNTER — Inpatient Hospital Stay: Payer: Medicare Other

## 2023-03-11 ENCOUNTER — Inpatient Hospital Stay (HOSPITAL_BASED_OUTPATIENT_CLINIC_OR_DEPARTMENT_OTHER): Payer: Medicare Other | Admitting: Hematology and Oncology

## 2023-03-11 VITALS — BP 121/72 | HR 81 | Temp 98.4°F | Resp 16 | Wt 265.2 lb

## 2023-03-11 DIAGNOSIS — Z5111 Encounter for antineoplastic chemotherapy: Secondary | ICD-10-CM | POA: Insufficient documentation

## 2023-03-11 DIAGNOSIS — D509 Iron deficiency anemia, unspecified: Secondary | ICD-10-CM

## 2023-03-11 DIAGNOSIS — C61 Malignant neoplasm of prostate: Secondary | ICD-10-CM

## 2023-03-11 DIAGNOSIS — D649 Anemia, unspecified: Secondary | ICD-10-CM | POA: Diagnosis not present

## 2023-03-11 DIAGNOSIS — R590 Localized enlarged lymph nodes: Secondary | ICD-10-CM | POA: Diagnosis not present

## 2023-03-11 LAB — CBC WITH DIFFERENTIAL (CANCER CENTER ONLY)
Abs Immature Granulocytes: 0.04 10*3/uL (ref 0.00–0.07)
Basophils Absolute: 0.1 10*3/uL (ref 0.0–0.1)
Basophils Relative: 1 %
Eosinophils Absolute: 0.4 10*3/uL (ref 0.0–0.5)
Eosinophils Relative: 5 %
HCT: 31.1 % — ABNORMAL LOW (ref 39.0–52.0)
Hemoglobin: 10.5 g/dL — ABNORMAL LOW (ref 13.0–17.0)
Immature Granulocytes: 1 %
Lymphocytes Relative: 17 %
Lymphs Abs: 1.5 10*3/uL (ref 0.7–4.0)
MCH: 29.9 pg (ref 26.0–34.0)
MCHC: 33.8 g/dL (ref 30.0–36.0)
MCV: 88.6 fL (ref 80.0–100.0)
Monocytes Absolute: 0.5 10*3/uL (ref 0.1–1.0)
Monocytes Relative: 6 %
Neutro Abs: 6.2 10*3/uL (ref 1.7–7.7)
Neutrophils Relative %: 70 %
Platelet Count: 284 10*3/uL (ref 150–400)
RBC: 3.51 MIL/uL — ABNORMAL LOW (ref 4.22–5.81)
RDW: 15.8 % — ABNORMAL HIGH (ref 11.5–15.5)
WBC Count: 8.7 10*3/uL (ref 4.0–10.5)
nRBC: 0 % (ref 0.0–0.2)

## 2023-03-11 LAB — CMP (CANCER CENTER ONLY)
ALT: 8 U/L (ref 0–44)
AST: 15 U/L (ref 15–41)
Albumin: 3.8 g/dL (ref 3.5–5.0)
Alkaline Phosphatase: 78 U/L (ref 38–126)
Anion gap: 9 (ref 5–15)
BUN: 23 mg/dL (ref 8–23)
CO2: 24 mmol/L (ref 22–32)
Calcium: 9.6 mg/dL (ref 8.9–10.3)
Chloride: 111 mmol/L (ref 98–111)
Creatinine: 1.55 mg/dL — ABNORMAL HIGH (ref 0.61–1.24)
GFR, Estimated: 49 mL/min — ABNORMAL LOW (ref >60)
Glucose, Bld: 108 mg/dL — ABNORMAL HIGH (ref 70–99)
Potassium: 4.3 mmol/L (ref 3.5–5.1)
Sodium: 144 mmol/L (ref 135–145)
Total Bilirubin: 0.3 mg/dL (ref 0.3–1.2)
Total Protein: 6.7 g/dL (ref 6.5–8.1)

## 2023-03-11 LAB — RETIC PANEL
Immature Retic Fract: 22.2 % — ABNORMAL HIGH (ref 2.3–15.9)
RBC.: 3.54 MIL/uL — ABNORMAL LOW (ref 4.22–5.81)
Retic Count, Absolute: 53.1 10*3/uL (ref 19.0–186.0)
Retic Ct Pct: 1.5 % (ref 0.4–3.1)
Reticulocyte Hemoglobin: 32.5 pg (ref >27.9)

## 2023-03-11 LAB — IRON AND IRON BINDING CAPACITY (CC-WL,HP ONLY)
Iron: 29 ug/dL — ABNORMAL LOW (ref 45–182)
Saturation Ratios: 8 % — ABNORMAL LOW (ref 17.9–39.5)
TIBC: 370 ug/dL (ref 250–450)
UIBC: 341 ug/dL (ref 117–376)

## 2023-03-11 LAB — FERRITIN: Ferritin: 75 ng/mL (ref 24–336)

## 2023-03-11 MED ORDER — LEUPROLIDE ACETATE (6 MONTH) 45 MG ~~LOC~~ KIT
45.0000 mg | PACK | Freq: Once | SUBCUTANEOUS | Status: AC
  Administered 2023-03-11: 45 mg via SUBCUTANEOUS
  Filled 2023-03-11: qty 45

## 2023-03-11 NOTE — Progress Notes (Signed)
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   ONCOLOGIC HISTORY: Initially presented with elevated PSA dating back to at least 2016, at which time it was 9 and increased further to 9.6 in 2017. Found to have elevated PSA of 17.6 throught his PCP.  02/05/2021: Established care with urologist, Dr. Richardo Hanks 03/13/2021: MRI: Diffuse abnormal signal throughout the peripheral zone, commonly seen in setting of prostatitis, however there were areas that appeared to extend beyond capsule at the base (PI-RADS 3, with some concern for diffuse prostate neoplasm). There was indeterminate low signal extending to the seminal vesicles but no evidence of pelvic metastatic disease or bony lesions.   07/03/2021: Underwent transrectal ultrasound with 12 biopsies of the prostate on 07/03/21.  The prostate volume measured 31 cc.  Out of 12 core biopsies, all 12 were positive.  The maximum Gleason score was 5+5, and this was seen in the right lateral base and right lateral mid. Additionally, Gleason 5+4 was seen in the left lateral apex, left lateral mid, left lateral base, right base, left apex, left mid, and left base, Gleason 4+5 was seen in the right mid, and Gleason 3+4 in the right apex and right lateral apex. Per diagnostic comment, "the right apex and right lateral apex biopsies contain adenocarcinoma with mostly Gleason pattern 3, with discontinuous foci of pattern 4 and 5. This finding does not change the overall diagnosis  of extensive grade group 5 disease."  07/30/2021: Bone scan showed no evidence of osseous metastases.  08/11/2021: Started Eligard 45 mg injection every 6 months. 11/02/2021-12/28/2021: Received curative,definitive radiation to the prostate, seminal vesicles and pelvic lymph nodes initially to 45 Gy in 25 fractions of 1.8 Gy.The prostate and PET-positive nodes were boosted to 75 Gy with 15 additional fractions of 2.0 Gy    HISTORY OF PRESENTING ILLNESS:  Matthew Flaming Sr. 67 y.o. male returns for a follow up for prostate cancer with left pelvic lymphadenopathy. He was last seen on 09/07/2022 by Georga Kaufmann. In the interim, he has not had any major changes in his health. He is due for his next Eligard injection today.   On exam today, [name] reports he has been well overall in the interim since his last visit in March 2024.  He reports he is had no major changes in his health.  He has been losing weight as he did change jobs to a more physical job.  He reports that he has to "move back and forth more quickly".  He notes he feels like he is doing a lot of exercise.  He is also doing some work on a treadmill.  He is lost 32 pounds with a goal being 190 to 220 pounds.  He reports that he has been drinking a lot of juice but not much in the way of water or Gatorade/Powerade.  He reports that he did stop taking iron pills when he ran out last month.  He has undergone EGD and colonoscopy with no clear etiology for his anemia.  He reports he is not having any lightheadedness, dizziness, shortness of  breath but does feel like he has low energy and fatigue.  He is also concerned about the fact that when he has orgasm he does not produce ejaculate.  He notes that he was told that this may resume once he has completed his Lupron therapy.  He denies fevers, chills, sweats, shortness of breath, chest pain, cough, urinary symptoms or edema. He has no other complaints.Rest of the 10 point ROS is below.   MEDICAL  HISTORY:  Past Medical History:  Diagnosis Date   Anal fistula    Arthritis    Foot pain, bilateral    referred to podiatry 06/2014   GERD (gastroesophageal reflux disease)    long history of   Hyperlipidemia    Hypertension    Polyarthralgia    shoulders, knees, ankles, wrists; neg rheum lab screen 04/2014   Prostate cancer (HCC)    Type 2 diabetes mellitus with diabetic neuropathy (HCC)    Wears glasses    Wears partial dentures upper     SURGICAL HISTORY: Past Surgical History:  Procedure Laterality Date   COLONOSCOPY     referral pending 03/2015   FISTULOTOMY N/A 02/11/2023   Procedure: INTERROGATION OF ANAL FISTULA WITH FISTULOTOMY;  Surgeon: Romie Levee, MD;  Location: [facility];  Service: General;  Laterality: N/A;   NO PAST SURGERIES     as of 03/2015    SOCIAL HISTORY: Social History   Socioeconomic History   Marital status: Divorced    Spouse name: Not on file   Number of children: 3   Years of education: Not on file   Highest education level: Not on file  Occupational History   Occupation: switcher/ spotter/forklift  Tobacco Use   Smoking status: Some Days    Types: Cigarettes    Passive exposure: Current   Smokeless tobacco: Never  Vaping Use   Vaping status: Never Used  Substance and Sexual Activity   Alcohol use: Yes    Comment: rare beer   Drug use: No   Sexual activity: Yes  Other Topics Concern   Not on file  Social History Narrative   Lives with his sister.   Works as a Engineer, civil (consulting) in [employer], water quality.   Exercise - active on the job.  Has 3 children in [place].   Social Determinants of Health   Financial Resource Strain: Not on file  Food Insecurity: Not on file  Transportation Needs: Not on file  Physical Activity: Not on file  Stress: Not on file  Social Connections: Not on file  Intimate Partner Violence: Not on file    FAMILY HISTORY: Family History  Problem Relation Age of Onset   Diabetes Mother    Hypertension  Mother    Other Father        murdered   Diabetes Sister    Arthritis Sister    Heart disease Sister    Diabetes Sister    Diabetes Sister    Cancer Neg Hx    Stroke Neg Hx    Prostate cancer Neg Hx        unknown   Bladder Cancer Neg Hx        unkown   Kidney cancer Neg Hx        unkown    ALLERGIES:  has No Known Allergies.  MEDICATIONS:  Current Outpatient Medications  Medication Sig Dispense Refill   atorvastatin (LIPITOR) 80 MG tablet TAKE 1 TABLET BY MOUTH EVERY DAY 90 tablet 0   calcium carbonate (  OS-CAL - DOSED IN MG OF ELEMENTAL CALCIUM) 1250 (500 Ca) MG tablet Take 1 tablet by mouth daily with breakfast.     cetirizine (ZYRTEC ALLERGY) 10 MG tablet Take 1 tablet (10 mg total) by mouth daily. 100 tablet 1   cholecalciferol (VITAMIN D3) 25 MCG (1000 UNIT) tablet Take 1,000 Units by mouth daily.     ferrous sulfate 325 (65 FE) MG EC tablet Take 1 tablet (325 mg total) by mouth daily with breakfast. 30 tablet 6   fluticasone (FLONASE) 50 MCG/ACT nasal spray Place 2 sprays into both nostrils daily. 9.9 mL 2   folic acid (FOLVITE) 1 MG tablet TAKE 1 TABLET BY MOUTH EVERY DAY 90 tablet 2   gabapentin (NEURONTIN) 600 MG tablet Take 1 tablet (600 mg total) by mouth 3 (three) times daily. 270 tablet 0   meloxicam (MOBIC) 7.5 MG tablet TAKE 1 TABLET (7.5 MG TOTAL) BY MOUTH DAILY. PLEASE MAKE AN APPT 90 tablet 0   metFORMIN (GLUCOPHAGE-XR) 500 MG 24 hr tablet TAKE 1 TABLET BY MOUTH EVERY DAY WITH BREAKFAST 90 tablet 0   olmesartan (BENICAR) 20 MG tablet TAKE 1 TABLET BY MOUTH EVERY DAY 90 tablet 0   oxyCODONE (OXY IR/ROXICODONE) 5 MG immediate release tablet Take 1 tablet (5 mg total) by mouth every 6 (six) hours as needed for severe pain. 20 tablet 0   oxyCODONE-acetaminophen (PERCOCET) 10-325 MG tablet Take 1 tablet by mouth 3 (three) times daily as needed.     pantoprazole (PROTONIX) 20 MG tablet Take 1 tablet (20 mg total) by mouth daily. 90 tablet 1   polyethylene glycol  (MIRALAX / GLYCOLAX) 17 g packet Take by mouth.     senna (SENOKOT) 8.6 MG tablet Take 1 tablet (8.6 mg total) by mouth daily as needed for constipation. 30 tablet 3   sildenafil (VIAGRA) 100 MG tablet Take 1 tablet (100 mg total) by mouth daily as needed for erectile dysfunction. 30 tablet 11   tamsulosin (FLOMAX) 0.4 MG CAPS capsule Take 1 capsule (0.4 mg total) by mouth daily after supper. 90 capsule 1   No current facility-administered medications for this visit.    REVIEW OF SYSTEMS:   Constitutional: ( - ) fevers, ( - )  chills , ( - ) night sweats Eyes: ( - ) blurriness of vision, ( - ) double vision, ( - ) watery eyes Ears, nose, mouth, throat, and face: ( - ) mucositis, ( - ) sore throat Respiratory: ( - ) cough, ( - ) dyspnea, ( - ) wheezes Cardiovascular: ( - ) palpitation, ( - ) chest discomfort, ( - ) lower extremity swelling Gastrointestinal:  ( - ) nausea, ( - ) heartburn, ( - ) change in bowel habits Skin: ( - ) abnormal skin rashes Lymphatics: ( - ) new lymphadenopathy, ( - ) easy bruising Neurological: ( - ) numbness, ( - ) tingling, ( - ) new weaknesses Behavioral/Psych: ( - ) mood change, ( - ) new changes  All other systems were reviewed with the patient and are negative.  PHYSICAL EXAMINATION: ECOG PERFORMANCE STATUS: 1 - Symptomatic but completely ambulatory  Vitals:   03/11/23 0825  BP: 121/72  Pulse: 81  Resp: 16  Temp: 98.4 F (36.9 C)  SpO2: 96%    Filed Weights   03/11/23 0825  Weight: 265 lb 3.2 oz (120.3 kg)     GENERAL: well appearing male in NAD  SKIN: skin color, texture, turgor are normal, no rashes or significant lesions  EYES: conjunctiva are pink and non-injected, sclera clear LYMPH:  no palpable lymphadenopathy in the cervical or supraclavicular lymph nodes.  LUNGS: clear to auscultation and percussion with normal breathing effort HEART: regular rate & rhythm and no murmurs and no lower extremity edema Musculoskeletal: no cyanosis of  digits and no clubbing  PSYCH: alert & oriented x 3, fluent speech NEURO: no focal motor/sensory deficits  LABORATORY DATA:  I have reviewed the data as listed    Latest Ref Rng & Units 03/11/2023    8:06 AM 09/07/2022   10:04 AM 04/01/2022    3:25 PM  CBC  WBC 4.0 - 10.5 K/uL 8.7  7.3  7.4   Hemoglobin 13.0 - 17.0 g/dL 16.1  09.6  04.5   Hematocrit 39.0 - 52.0 % 31.1  31.1  31.3   Platelets 150 - 400 K/uL 284  284  300        Latest Ref Rng & Units 03/11/2023    8:06 AM 11/17/2022   10:18 AM 09/07/2022   10:04 AM  CMP  Glucose 70 - 99 mg/dL 409  811  914   BUN 8 - 23 mg/dL 23  18  17    Creatinine 0.61 - 1.24 mg/dL 7.82  9.56  2.13   Sodium 135 - 145 mmol/L 144  141  139   Potassium 3.5 - 5.1 mmol/L 4.3  4.2  4.3   Chloride 98 - 111 mmol/L 111  106  107   CO2 22 - 32 mmol/L 24  20  26    Calcium 8.9 - 10.3 mg/dL 9.6  9.6  9.1   Total Protein 6.5 - 8.1 g/dL 6.7   6.5   Total Bilirubin 0.3 - 1.2 mg/dL 0.3   0.3   Alkaline Phos 38 - 126 U/L 78   80   AST 15 - 41 U/L 15   15   ALT 0 - 44 U/L 8   7     ASSESSMENT & PLAN Matthew Flaming Sr. Is a 67 y.o. male who presents to the clinic for a follow up visit for advanced prostate cancer.   # Castration-sensitive advanced prostate cancer with limited pelvic adenopathy  --Started Eligard 45 injections q 6 months on 09/07/2022 --Received curative, definitive radiation to the prostate, seminal vesicles and pelvic lymph nodes from 11/02/2021-12/28/2021. --Patient is not interested in therapy escalation with abiraterone and prednisone or enzalutamide. He prefers to  continue with androgen deprivation therapy alone. --Under the care of Dr. Richardo Hanks, last seen on 08/10/2022.  PLAN: --Due for another Eligard injection today. --Labs today show white blood cell 8.7, hemoglobin 10.5, MCV 88.6, and platelets of 284. No other cytopenias and creatinine/LFTs normal.  PSA level pending.  --Proceed with treatment today and return in 6 months for a follow up  visit with Dr. Leonides Schanz before next Eligard injection.  Dose of Lupron in February 2025 will be his last dose, completing 2 years post radiation therapy.  #Lack of Ejaculate -- Patient reports that when he has an orgasm he does not produce any ejaculate. -- If this is persistent after he discontinues Lupron will request advice from urology. -- Concerned that his seminal vesicles may have been altered during radiation therapy resulting in the lack of ejaculate.  # Normocytic Anemia: --Etiology unknown --Labs today show iron sat of 8% with ferritin of 75. --Patient notes he will be restarting p.o. iron therapy over-the-counter. --May also be a component of radiation to the pelvic bones causing the suppression  of his blood counts --Can consider bone marrow biopsy to further evaluate if counts worsen or fail to improve over time. --Patient denies any bleeding episodes.   No orders of the defined types were placed in this encounter.   All questions were answered. The patient knows to call the clinic with any problems, questions or concerns.  I have spent a total of 30 minutes minutes of face-to-face and non-face-to-face time, preparing to see the patient,  performing a medically appropriate examination, counseling and educating the patient, ordering tests/procedures,  documenting clinical information in the electronic health record,and care coordination.   Ulysees Barns, MD Department of Hematology/Oncology [facility] Phone: [phone] Pager: [phone] Email: [email or web address]

## 2023-03-13 LAB — PROSTATE-SPECIFIC AG, SERUM (LABCORP): Prostate Specific Ag, Serum: 0.2 ng/mL (ref 0.0–4.0)

## 2023-03-13 LAB — TESTOSTERONE: Testosterone: 49 ng/dL — ABNORMAL LOW (ref 264–916)

## 2023-03-14 ENCOUNTER — Telehealth: Payer: Self-pay | Admitting: Hematology and Oncology

## 2023-04-06 ENCOUNTER — Other Ambulatory Visit: Payer: Self-pay | Admitting: Physician Assistant

## 2023-04-06 NOTE — Telephone Encounter (Signed)
Requested Prescriptions  Pending Prescriptions Disp Refills   fluticasone (FLONASE) 50 MCG/ACT nasal spray [Pharmacy Med Name: FLUTICASONE PROP 50 MCG SPRAY] 48 mL 0    Sig: SPRAY 2 SPRAYS INTO EACH NOSTRIL EVERY DAY     Ear, Nose, and Throat: Nasal Preparations - Corticosteroids Passed - 04/06/2023  2:19 AM      Passed - Valid encounter within last 12 months    Recent Outpatient Visits           4 months ago Hyperglycemia   [place] [facility], [place], [place]   1 year ago Type 2 diabetes mellitus with morbid obesity [facility])   [place] [facility] Jonah Blue B, MD   1 year ago Type 2 diabetes mellitus with peripheral neuropathy [facility])   [place] Marcine Matar, MD   1 year ago Need for influenza vaccination   [facility] [place], [place] L, RPH-CPP   2 years ago Type 2 diabetes mellitus with peripheral neuropathy [facility])   [place] [facility] Marcine Matar, MD       Future Appointments             In 1 month Laural Benes, Binnie Rail, MD [facility]   In 4 months Richardo Hanks, Laurette Schimke, MD [facility] [place]

## 2023-04-20 ENCOUNTER — Other Ambulatory Visit: Payer: Self-pay | Admitting: Physician Assistant

## 2023-04-20 DIAGNOSIS — K219 Gastro-esophageal reflux disease without esophagitis: Secondary | ICD-10-CM

## 2023-05-03 ENCOUNTER — Other Ambulatory Visit: Payer: Self-pay | Admitting: Internal Medicine

## 2023-05-03 DIAGNOSIS — E1142 Type 2 diabetes mellitus with diabetic polyneuropathy: Secondary | ICD-10-CM

## 2023-05-03 NOTE — Telephone Encounter (Signed)
Medication Refill - Medication: metFORMIN (GLUCOPHAGE-XR) 500 MG 24 hr tablet   Pt stated he has 2 tablets left.  Please advise.   Has the patient contacted their pharmacy? Yes.     (Agent: If yes, when and what did the pharmacy advise?)  Preferred Pharmacy (with phone number or street name):  [phone] [place], [place] - [address]  [address] [place] [address]  Phone: [phone] Fax: [phone]  Hours: Not open 24 hours   Has the patient been seen for an appointment in the last year OR does the patient have an upcoming appointment? Yes.    Agent: Please be advised that RX refills may take up to 3 business days. We ask that you follow-up with your pharmacy.

## 2023-05-03 NOTE — Telephone Encounter (Signed)
Medication Refill - Medication:  fluticasone (FLONASE) 50 MCG/ACT nasal spray    Has the patient contacted their pharmacy? No.  Preferred Pharmacy (with phone number or street name):  [id number] [place], [place] - [address] AT [place] [address] Phone: [phone]  Fax: [phone]     Has the patient been seen for an appointment in the last year OR does the patient have an upcoming appointment? Yes.    Agent: Please be advised that RX refills may take up to 3 business days. We ask that you follow-up with your pharmacy.

## 2023-05-04 MED ORDER — METFORMIN HCL ER 500 MG PO TB24
ORAL_TABLET | ORAL | 0 refills | Status: DC
Start: 2023-05-04 — End: 2023-06-30

## 2023-05-04 MED ORDER — FLUTICASONE PROPIONATE 50 MCG/ACT NA SUSP: 2.0000 | Freq: Every day | NASAL | 0 refills | Status: DC

## 2023-05-04 NOTE — Telephone Encounter (Signed)
Requested medication (s) are due for refill today:   Yes  Requested medication (s) are on the active medication list:   Yes  Future visit scheduled:   Yes 11/15   Last ordered: 04/06/2023 48 ml, 0 refills  Returned because amount to be dispensed being requested by pharmacy and dosage.     Requested Prescriptions  Pending Prescriptions Disp Refills   fluticasone (FLONASE) 50 MCG/ACT nasal spray 48 mL 0     Ear, Nose, and Throat: Nasal Preparations - Corticosteroids Passed - 05/03/2023 11:40 AM      Passed - Valid encounter within last 12 months    Recent Outpatient Visits           5 months ago Hyperglycemia   [place] [facility] [place], [place], [place]   1 year ago Type 2 diabetes mellitus with morbid obesity [facility])   [place] [facility] Jonah Blue B, MD   1 year ago Type 2 diabetes mellitus with peripheral neuropathy [facility])   [place] [facility] Marcine Matar, MD   2 years ago Need for influenza vaccination   [facility] [place], [place] L, RPH-CPP   2 years ago Type 2 diabetes mellitus with peripheral neuropathy [facility])   [place] [facility] Marcine Matar, MD       Future Appointments             In 2 weeks Marcine Matar, MD [facility]   In 3 months Richardo Hanks, Laurette Schimke, MD [facility] [place]

## 2023-05-04 NOTE — Telephone Encounter (Signed)
Labs are in date.  LOV 11/17/2022 and has upcoming appt. 05/20/2023 so #90 day supply given.  Requested Prescriptions  Pending Prescriptions Disp Refills   metFORMIN (GLUCOPHAGE-XR) 500 MG 24 hr tablet 90 tablet 0    Sig: TAKE 1 TABLET BY MOUTH EVERY DAY WITH BREAKFAST     Endocrinology:  Diabetes - Biguanides Failed - 05/03/2023 11:40 AM      Failed - Cr in normal range and within 360 days    Creatinine  Date Value Ref Range Status  03/11/2023 1.55 (H) 0.61 - 1.24 mg/dL Final  28/41/3244 01.0 20.0 - 300.0 mg/dL Final   Creat  Date Value Ref Range Status  12/05/2015 1.18 0.70 - 1.33 mg/dL Final         Failed - eGFR in normal range and within 360 days    GFR calc Af Amer  Date Value Ref Range Status  05/06/2018 >60 >60 mL/min Final    Comment:    (NOTE) The eGFR has been calculated using the CKD EPI equation. This calculation has not been validated in all clinical situations. eGFR's persistently <60 mL/min signify possible Chronic Kidney Disease.    GFR, Estimated  Date Value Ref Range Status  03/11/2023 49 (L) >60 mL/min Final    Comment:    (NOTE) Calculated using the CKD-EPI Creatinine Equation (2021)    eGFR  Date Value Ref Range Status  11/17/2022 73 >59 mL/min/1.73 Final         Failed - CBC within normal limits and completed in the last 12 months    WBC  Date Value Ref Range Status  05/06/2018 10.9 (H) 4.0 - 10.5 K/uL Final   WBC Count  Date Value Ref Range Status  03/11/2023 8.7 4.0 - 10.5 K/uL Final   RBC  Date Value Ref Range Status  03/11/2023 3.51 (L) 4.22 - 5.81 MIL/uL Final   RBC.  Date Value Ref Range Status  03/11/2023 3.54 (L) 4.22 - 5.81 MIL/uL Final   Hemoglobin  Date Value Ref Range Status  03/11/2023 10.5 (L) 13.0 - 17.0 g/dL Final   HCT  Date Value Ref Range Status  03/11/2023 31.1 (L) 39.0 - 52.0 % Final   MCHC  Date Value Ref Range Status  03/11/2023 33.8 30.0 - 36.0 g/dL Final   [facility]  Date Value Ref Range Status   03/11/2023 29.9 26.0 - 34.0 pg Final   MCV  Date Value Ref Range Status  03/11/2023 88.6 80.0 - 100.0 fL Final   No results found for: "PLTCOUNTKUC", "LABPLAT", "POCPLA" RDW  Date Value Ref Range Status  03/11/2023 15.8 (H) 11.5 - 15.5 % Final         Passed - HBA1C is between 0 and 7.9 and within 180 days    HbA1c, POC (controlled diabetic range)  Date Value Ref Range Status  11/17/2022 5.8 0.0 - 7.0 % Final         Passed - B12 Level in normal range and within 720 days    Vitamin B-12  Date Value Ref Range Status  09/07/2022 243 180 - 914 pg/mL Final    Comment:    (NOTE) This assay is not validated for testing neonatal or myeloproliferative syndrome specimens for Vitamin B12 levels. Performed at [facility], [address] [place], [place] [address]          Passed - Valid encounter within last 6 months    Recent Outpatient Visits  5 months ago Hyperglycemia   [place] [facility], [place], [place]   1 year ago Type 2 diabetes mellitus with morbid obesity [facility])   [facility] Jonah Blue B, MD   1 year ago Type 2 diabetes mellitus with peripheral neuropathy [facility])   [place] [facility] [place] Marcine Matar, MD   2 years ago Need for influenza vaccination   [facility] [place], Cornelius [place], RPH-CPP   2 years ago Type 2 diabetes mellitus with peripheral neuropathy [facility])    [facility] Marcine Matar, MD       Future Appointments             In 2 weeks Marcine Matar, MD [facility]   In 3 months Richardo Hanks, Laurette Schimke, MD [facility] [place]

## 2023-05-10 ENCOUNTER — Other Ambulatory Visit: Payer: Self-pay | Admitting: Internal Medicine

## 2023-05-10 ENCOUNTER — Telehealth: Payer: Self-pay | Admitting: Urology

## 2023-05-10 DIAGNOSIS — R399 Unspecified symptoms and signs involving the genitourinary system: Secondary | ICD-10-CM

## 2023-05-10 DIAGNOSIS — E1142 Type 2 diabetes mellitus with diabetic polyneuropathy: Secondary | ICD-10-CM

## 2023-05-10 MED ORDER — TAMSULOSIN HCL 0.4 MG PO CAPS
0.4000 mg | ORAL_CAPSULE | Freq: Every day | ORAL | 3 refills | Status: DC
Start: 2023-05-10 — End: 2023-08-23

## 2023-05-10 NOTE — Telephone Encounter (Signed)
Patient lvm requesting refill for Tamsulosin. Per chart, the last rx for medication was from [facility], PA-C on 11/17/22. It shows discontinued by Dr. Richardo Hanks on the same date. I tried to call patient back, but no answer and vm full. If we can refill, [facility] [address] in [place].

## 2023-05-10 NOTE — Telephone Encounter (Signed)
RX sent

## 2023-05-11 ENCOUNTER — Ambulatory Visit: Payer: Self-pay | Admitting: *Deleted

## 2023-05-11 NOTE — Telephone Encounter (Signed)
Summary: nauseous after eating   Patient stated he get nauseous after eating. He has an appt to see Dr Laural Benes on 11/15. Please f/u with patient          Called patient # [phone] to review sx of nausea after eating. No answer, no VM to leave message.

## 2023-05-11 NOTE — Telephone Encounter (Signed)
Call placed to patient to offer appointment for 05/13/2023 at 2:30 with his PCP . Unable to reach VM full

## 2023-05-11 NOTE — Telephone Encounter (Signed)
  Chief Complaint: nausea everytime after eating. Weight loss 40 lbs 2 months Symptoms: see above. Reports hot and warm flashes and is taking hormone shots. Had one episode of vomiting 2 weeks ago . Diarrhea. Does have constipation but have stopped stool softeners due to hot / warm flashes after taking and causes to urinate a lot.  Frequency: 2 months  Pertinent Negatives: Patient denies fever no vomiting now no diarrhea or constipation now  Disposition: [] ED /[] Urgent Care (no appt availability in office) / [x] Appointment(In office/virtual)/ []  [facility]/ [] Home Care/ [] Refused Recommended Disposition /[] [facility]/ []  Follow-up with PCP Additional Notes:   Appt already scheduled 05/20/23. Patient would like to see if earlier appt can be made due to worsening sx. Patient works from 4 am -2 pm. Please advise if earlier appt can be scheduled   Summary: nauseous after eating   Patient stated he get nauseous after eating. He has an appt to see Dr Laural Benes on 11/15. Please f/u with patient          Reason for Disposition  Nausea is a chronic symptom (recurrent or ongoing AND present > 4 weeks)  Answer Assessment - Initial Assessment Questions 1. NAUSEA SEVERITY: "How bad is the nausea?" (e.g., mild, moderate, severe; dehydration, weight loss)   - MILD: loss of appetite without change in eating habits   - MODERATE: decreased oral intake without significant weight loss, dehydration, or malnutrition   - SEVERE: inadequate caloric or fluid intake, significant weight loss, symptoms of dehydration     Everytime eats gets nausea hot and warm flashes,  lost weight approx 40 lbs 2. ONSET: "When did the nausea begin?"     Approx 2 months ago  3. VOMITING: "Any vomiting?" If Yes, ask: "How many times today?"     2 weeks ago  4. RECURRENT SYMPTOM: "Have you had nausea before?" If Yes, ask: "When was the last time?" "What happened that time?"     Na  5. CAUSE: "What do you  think is causing the nausea?"     Not sure  6. PREGNANCY: "Is there any chance you are pregnant?" (e.g., unprotected intercourse, missed birth control pill, broken condom)     na  Protocols used: Nausea-A-AH

## 2023-05-17 ENCOUNTER — Ambulatory Visit: Payer: Medicare Other | Attending: Internal Medicine

## 2023-05-17 VITALS — Ht 71.0 in | Wt 255.0 lb

## 2023-05-17 DIAGNOSIS — Z0000 Encounter for general adult medical examination without abnormal findings: Secondary | ICD-10-CM

## 2023-05-17 NOTE — Progress Notes (Signed)
Subjective:   Matthew Malley Sr. is a 67 y.o. male who presents for an Initial Medicare Annual Wellness Visit.  Visit Complete: Virtual I connected with  Matthew Flaming Sr. on 05/17/23 by a audio enabled telemedicine application and verified that I am speaking with the correct person using two identifiers.  Patient Location: Home  Provider Location: Office/Clinic  I discussed the limitations of evaluation and management by telemedicine. The patient expressed understanding and agreed to proceed.  Vital Signs: Because this visit was a virtual/telehealth visit, some criteria may be missing or patient reported. Any vitals not documented were not able to be obtained and vitals that have been documented are patient reported.  Cardiac Risk Factors include: advanced age (>28men, >27 women);diabetes mellitus;dyslipidemia;family history of premature cardiovascular disease;hypertension;male gender;obesity (BMI >30kg/m2);sedentary lifestyle     Objective:    Today's Vitals   05/17/23 0854 05/17/23 0858  Weight: 255 lb (115.7 kg)   Height: 5\' 11"  (1.803 m)   PainSc: 8  8   PainLoc: Rectum    Body mass index is 35.57 kg/m.     05/17/2023    8:46 AM 02/11/2023    5:59 AM 01/27/2022    1:03 PM 08/11/2021   12:21 PM 04/15/2017    9:39 AM 03/30/2017    8:09 AM 01/10/2017   11:23 AM  Advanced Directives  Does Patient Have a Medical Advance Directive? No No No No No No No  Would patient like information on creating a medical advance directive? No - Patient declined  Yes (ED - Information included in AVS)  No - Patient declined  No - Patient declined    Current Medications (verified) Outpatient Encounter Medications as of 05/17/2023  Medication Sig   atorvastatin (LIPITOR) 80 MG tablet TAKE 1 TABLET BY MOUTH EVERY DAY   calcium carbonate (OS-CAL - DOSED IN MG OF ELEMENTAL CALCIUM) 1250 (500 Ca) MG tablet Take 1 tablet by mouth daily with breakfast.   cetirizine (ZYRTEC ALLERGY) 10 MG tablet  Take 1 tablet (10 mg total) by mouth daily.   cholecalciferol (VITAMIN D3) 25 MCG (1000 UNIT) tablet Take 1,000 Units by mouth daily.   ferrous sulfate 325 (65 FE) MG EC tablet Take 1 tablet (325 mg total) by mouth daily with breakfast.   fluticasone (FLONASE) 50 MCG/ACT nasal spray Place 2 sprays into both nostrils daily.   folic acid (FOLVITE) 1 MG tablet TAKE 1 TABLET BY MOUTH EVERY DAY   gabapentin (NEURONTIN) 600 MG tablet Take 1 tablet (600 mg total) by mouth 3 (three) times daily.   meloxicam (MOBIC) 7.5 MG tablet TAKE 1 TABLET (7.5 MG TOTAL) BY MOUTH DAILY. PLEASE MAKE AN APPT   metFORMIN (GLUCOPHAGE-XR) 500 MG 24 hr tablet TAKE 1 TABLET BY MOUTH EVERY DAY WITH BREAKFAST   olmesartan (BENICAR) 20 MG tablet TAKE 1 TABLET BY MOUTH EVERY DAY   oxyCODONE (OXY IR/ROXICODONE) 5 MG immediate release tablet Take 1 tablet (5 mg total) by mouth every 6 (six) hours as needed for severe pain.   oxyCODONE-acetaminophen (PERCOCET) 10-325 MG tablet Take 1 tablet by mouth 3 (three) times daily as needed.   pantoprazole (PROTONIX) 20 MG tablet Take 1 tablet (20 mg total) by mouth daily. Must have office visit for refills   polyethylene glycol (MIRALAX / GLYCOLAX) 17 g packet Take by mouth.   senna (SENOKOT) 8.6 MG tablet Take 1 tablet (8.6 mg total) by mouth daily as needed for constipation.   sildenafil (VIAGRA) 100 MG tablet Take 1 tablet (  100 mg total) by mouth daily as needed for erectile dysfunction.   tamsulosin (FLOMAX) 0.4 MG CAPS capsule Take 1 capsule (0.4 mg total) by mouth daily after supper.   No facility-administered encounter medications on file as of 05/17/2023.    Allergies (verified) Patient has no known allergies.   History: Past Medical History:  Diagnosis Date   Anal fistula    Arthritis    Foot pain, bilateral    referred to podiatry 06/2014   GERD (gastroesophageal reflux disease)    long history of   Hyperlipidemia    Hypertension    Polyarthralgia    shoulders,  knees, ankles, wrists; neg rheum lab screen 04/2014   Prostate cancer (HCC)    Type 2 diabetes mellitus with diabetic neuropathy (HCC)    Wears glasses    Wears partial dentures upper    Past Surgical History:  Procedure Laterality Date   COLONOSCOPY     referral pending 03/2015   FISTULOTOMY N/A 02/11/2023   Procedure: INTERROGATION OF ANAL FISTULA WITH FISTULOTOMY;  Surgeon: Romie Levee, MD;  Location[facility];  Service: General;  Laterality: N/A;   NO PAST SURGERIES     as of 03/2015   Family History  Problem Relation Age of Onset   Diabetes Mother    Hypertension Mother    Other Father        murdered   Diabetes Sister    Arthritis Sister    Heart disease Sister    Diabetes Sister    Diabetes Sister    Cancer Neg Hx    Stroke Neg Hx    Prostate cancer Neg Hx        unknown   Bladder Cancer Neg Hx        unkown   Kidney cancer Neg Hx        unkown   Social History   Socioeconomic History   Marital status: Divorced    Spouse name: Not on file   Number of children: 3   Years of education: Not on file   Highest education level: Not on file  Occupational History   Occupation: switcher/ spotter/forklift  Tobacco Use   Smoking status: Some Days    Types: Cigarettes    Passive exposure: Current   Smokeless tobacco: Never  Vaping Use   Vaping status: Never Used  Substance and Sexual Activity   Alcohol use: Yes    Comment: rare beer   Drug use: No   Sexual activity: Yes  Other Topics Concern   Not on file  Social History Narrative   Lives with his sister.   Works as a Engineer, civil (consulting) in [employer], water quality.   Exercise - active on the job.  Has 3 children in [place].   Social Determinants of Health   Financial Resource Strain: Low Risk  (05/17/2023)   Overall Financial Resource Strain (CARDIA)    Difficulty of Paying Living Expenses: Not hard at all  Food Insecurity: No Food Insecurity (05/17/2023)   Hunger Vital Sign    Worried About Running Out of  Food in the Last Year: Never true    Ran Out of Food in the Last Year: Never true  Transportation Needs: No Transportation Needs (05/17/2023)   PRAPARE - Administrator, Civil Service (Medical): No    Lack of Transportation (Non-Medical): No  Physical Activity: Inactive (05/17/2023)   Exercise Vital Sign    Days of Exercise per Week: 0 days    Minutes  of Exercise per Session: 0 min  Stress: No Stress Concern Present (05/17/2023)   [employer] of Occupational Health - Occupational Stress Questionnaire    Feeling of Stress : Not at all  Social Connections: Unknown (05/17/2023)   Social Connection and Isolation Panel [NHANES]    Frequency of Communication with Friends and Family: More than three times a week    Frequency of Social Gatherings with Friends and Family: More than three times a week    Attends Religious Services: Not on Marketing executive or Organizations: Yes    Attends Engineer, structural: More than 4 times per year    Marital Status: Not on file    Tobacco Counseling Ready to quit: Not Answered Counseling given: Not Answered   Clinical Intake:  Pre-visit preparation completed: Yes  Pain : 0-10 Pain Score: 8  Pain Type: Acute pain (s/p rectal surgery) Pain Location: Rectum     BMI - recorded: 35.57 Nutritional Status: BMI > 30  Obese Nutritional Risks: None Diabetes: Yes CBG done?: No Did pt. bring in CBG monitor from home?: No  How often do you need to have someone help you when you read instructions, pamphlets, or other written materials from your doctor or pharmacy?: 1 - Never What is the last grade level you completed in school?: 10th grade  Interpreter Needed?: No  Information entered by :: Susie Cassette, LPN.   Activities of Daily Living    05/17/2023    9:02 AM 02/11/2023    6:09 AM  In your present state of health, do you have any difficulty performing the following activities:  Hearing? 0 0   Vision? 0 0  Difficulty concentrating or making decisions? 0 0  Walking or climbing stairs? 0 0  Dressing or bathing? 0 0  Doing errands, shopping? 0   Preparing Food and eating ? N   Using the Toilet? N   In the past six months, have you accidently leaked urine? N   Do you have problems with loss of bowel control? N   Managing your Medications? N   Managing your Finances? N   Housekeeping or managing your Housekeeping? N     Patient Care Team: Marcine Matar, MD as PCP - General (Internal Medicine) Margaretmary Dys, MD as Consulting Physician (Radiation Oncology) Sondra Come, MD as Consulting Physician (Urology) Maryclare Labrador, RN as Registered Nurse Leonides Schanz Thereasa Distance, MD as Consulting Physician (Hematology and Oncology)  Indicate any recent Medical Services you may have received from other than [facility] providers in the past year (date may be approximate).     Assessment:   This is a routine wellness examination for Owen.  Hearing/Vision screen Hearing Screening - Comments:: Denies hearing difficulties   Vision Screening - Comments:: Wears rx glasses - up to date with routine eye exams with [facility]    Goals Addressed             This Visit's Progress    My goal for 2025 is to continue to lose weight.        Depression Screen    05/17/2023    8:49 AM 11/17/2022    9:34 AM 01/21/2022   10:56 AM 07/21/2021    9:11 AM 11/13/2020    9:17 AM 05/23/2018    8:39 AM 04/15/2017    9:42 AM  PHQ 2/9 Scores  PHQ - 2 Score 1 0 6 0 0 3 0  PHQ- 9 Score 3  9   13      Fall Risk    05/17/2023    8:51 AM 11/17/2022    9:18 AM 01/21/2022   10:56 AM 07/21/2021    9:11 AM 11/13/2020    9:17 AM  Fall Risk   Falls in the past year? 0 0 0 0 0  Number falls in past yr: 0 0 0 0 0  Injury with Fall? 0 0 0 0 0  Risk for fall due to : No Fall Risks No Fall Risks No Fall Risks No Fall Risks No Fall Risks  Follow up Falls prevention discussed         MEDICARE RISK AT HOME: Medicare Risk at Home Any stairs in or around the home?: Yes If so, are there any without handrails?: No Home free of loose throw rugs in walkways, pet beds, electrical cords, etc?: Yes Adequate lighting in your home to reduce risk of falls?: Yes Life alert?: No Use of a cane, walker or w/c?: No Grab bars in the bathroom?: Yes Shower chair or bench in shower?: No Elevated toilet seat or a handicapped toilet?: No  TIMED UP AND GO:  Was the test performed? No    Cognitive Function:    05/17/2023    9:02 AM  MMSE - Mini Mental State Exam  Not completed: Unable to complete        05/17/2023    9:02 AM  6CIT Screen  What Year? 0 points  What month? 0 points  What time? 0 points  Count back from 20 0 points  Months in reverse 0 points  Repeat phrase 0 points  Total Score 0 points    Immunizations Immunization History  Administered Date(s) Administered   Hepb-cpg 07/17/2018, 08/28/2018   Influenza,inj,Quad PF,6+ Mos 04/29/2014, 03/14/2015, 03/28/2018, 02/26/2019, 07/31/2020, 04/21/2021   Influenza,inj,Quad PF,6-35 Mos 02/26/2019   Influenza-Unspecified 04/29/2014, 03/14/2015   PFIZER Comirnaty(Gray Top)Covid-19 Tri-Sucrose Vaccine 10/29/2020   PFIZER(Purple Top)SARS-COV-2 Vaccination 10/01/2019, 10/24/2019, 04/10/2020   PNEUMOCOCCAL CONJUGATE-20 07/21/2021   Pneumococcal Polysaccharide-23 03/14/2015, 07/17/2018   Tdap 03/14/2015, 07/17/2018   Zoster Recombinant(Shingrix) 07/26/2018, 09/30/2018    TDAP status: Up to date  Flu Vaccine status: Due, Education has been provided regarding the importance of this vaccine. Advised may receive this vaccine at local pharmacy or Health Dept. Aware to provide a copy of the vaccination record if obtained from local pharmacy or Health Dept. Verbalized acceptance and understanding.  Pneumococcal vaccine status: Up to date  Covid-19 vaccine status: Completed vaccines  Qualifies for Shingles Vaccine?  Yes   Zostavax completed No   Shingrix Completed?: Yes  Screening Tests Health Maintenance  Topic Date Due   FOOT EXAM  03/13/2016   OPHTHALMOLOGY EXAM  03/13/2016   Diabetic kidney evaluation - Urine ACR  01/22/2023   INFLUENZA VACCINE  02/03/2023   COVID-19 Vaccine (5 - 2023-24 season) 03/06/2023   HEMOGLOBIN A1C  05/20/2023   Diabetic kidney evaluation - eGFR measurement  03/10/2024   Medicare Annual Wellness (AWV)  05/16/2024   DTaP/Tdap/Td (3 - Td or Tdap) 07/17/2028   Colonoscopy  09/30/2028   Pneumonia Vaccine 45+ Years old  Completed   Hepatitis C Screening  Completed   Zoster Vaccines- Shingrix  Completed   HPV VACCINES  Aged Out    Health Maintenance  Health Maintenance Due  Topic Date Due   FOOT EXAM  03/13/2016   OPHTHALMOLOGY EXAM  03/13/2016   Diabetic kidney evaluation - Urine ACR  01/22/2023  INFLUENZA VACCINE  02/03/2023   COVID-19 Vaccine (5 - 2023-24 season) 03/06/2023    Colorectal cancer screening: Type of screening: Colonoscopy. Completed 09/30/2021. Repeat every 7 years    Lung Cancer Screening: (Low Dose CT Chest recommended if Age 29-80 years, 20 pack-year currently smoking OR have quit w/in 15years.) does not qualify.   Lung Cancer Screening Referral: no  Additional Screening:  Hepatitis C Screening: does qualify; Completed 01/10/2017  Vision Screening: Recommended annual ophthalmology exams for early detection of glaucoma and other disorders of the eye. Is the patient up to date with their annual eye exam?  Yes  Who is the provider or what is the name of the office in which the patient attends annual eye exams? [facility] If pt is not established with a provider, would they like to be referred to a provider to establish care? No .   Dental Screening: Recommended annual dental exams for proper oral hygiene  Diabetic Foot Exam: Diabetic Foot Exam: Completed 03/14/2015; patient is overdue for foot exam.  Community Resource  Referral / Chronic Care Management: CRR required this visit?  No   CCM required this visit?  No    Plan:     I have personally reviewed and noted the following in the patient's chart:   Medical and social history Use of alcohol, tobacco or illicit drugs  Current medications and supplements including opioid prescriptions. Patient is currently taking opioid prescriptions. Information provided to patient regarding non-opioid alternatives. Patient advised to discuss non-opioid treatment plan with their provider. Functional ability and status Nutritional status Physical activity Advanced directives List of other physicians Hospitalizations, surgeries, and ER visits in previous 12 months Vitals Screenings to include cognitive, depression, and falls Referrals and appointments  In addition, I have reviewed and discussed with patient certain preventive protocols, quality metrics, and best practice recommendations. A written personalized care plan for preventive services as well as general preventive health recommendations were provided to patient.     Mickeal Needy, LPN   16/04/[id number]   After Visit Summary: (MyChart) Due to this being a telephonic visit, the after visit summary with patients personalized plan was offered to patient via MyChart   Nurse Notes: None

## 2023-05-17 NOTE — Patient Instructions (Addendum)
Mr. Waldrop , Thank you for taking time to come for your Medicare Wellness Visit. I appreciate your ongoing commitment to your health goals. Please review the following plan we discussed and let me know if I can assist you in the future.   Referrals/Orders/Follow-Ups/Clinician Recommendations: No  This is a list of the screening recommended for you and due dates:  Health Maintenance  Topic Date Due   Complete foot exam   03/13/2016   Eye exam for diabetics  03/13/2016   Yearly kidney health urinalysis for diabetes  01/22/2023   Flu Shot  02/03/2023   COVID-19 Vaccine (5 - 2023-24 season) 03/06/2023   Hemoglobin A1C  05/20/2023   Yearly kidney function blood test for diabetes  03/10/2024   Medicare Annual Wellness Visit  05/16/2024   DTaP/Tdap/Td vaccine (3 - Td or Tdap) 07/17/2028   Colon Cancer Screening  09/30/2028   Pneumonia Vaccine  Completed   Hepatitis C Screening  Completed   Zoster (Shingles) Vaccine  Completed   HPV Vaccine  Aged Out    Advanced directives: (Declined) Advance directive discussed with you today. Even though you declined this today, please call our office should you change your mind, and we can give you the proper paperwork for you to fill out.  Next Medicare Annual Wellness Visit scheduled for next year: Yes  Preventive Care attachment FALL PREVENTION attachment

## 2023-05-20 ENCOUNTER — Ambulatory Visit: Payer: Medicare Other | Admitting: Internal Medicine

## 2023-05-27 IMAGING — CT NM PET TUM IMG SKULL BASE T - THIGH
7 series · 25 of 25 positions shown · non-contrast
Comparison: Bone scan from July 30, 2021 and MRI of the prostate
from March 15, 2021.

CLINICAL DATA: Prostate cancer, high-risk disease presenting for
staging evaluation.

EXAM:
NUCLEAR MEDICINE PET SKULL BASE TO THIGH
TECHNIQUE: 9.09 mCi F18 Piflufolastat (Pylarify) was injected intravenously.
Full-ring PET imaging was performed from the skull base to thigh
after the radiotracer. CT data was obtained and used for attenuation
correction and anatomic localization.

[Series 3: pet sk_thigh ac · axial · 5.0mm · 4.07mm/px · z∈[-1276,-288]mm · 5 of 248 slices shown]
[im 1/248]
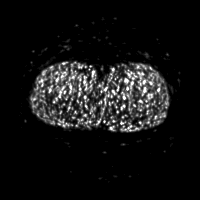
[im 62/248]
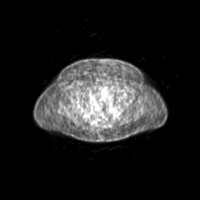
[im 124/248]
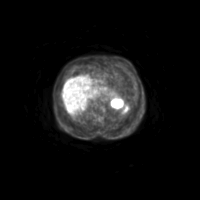
[im 186/248]
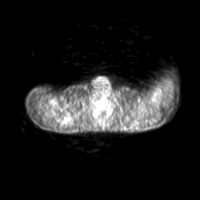
[im 248/248]
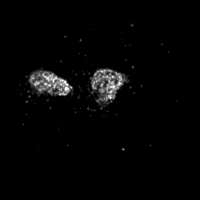

[Series 4: ct sk_thigh 5.0 hd_fov · axial · 5.0mm · 1.07mm/px · z∈[-1276,-288]mm · 6 of 248 slices shown]
[im 1/248]
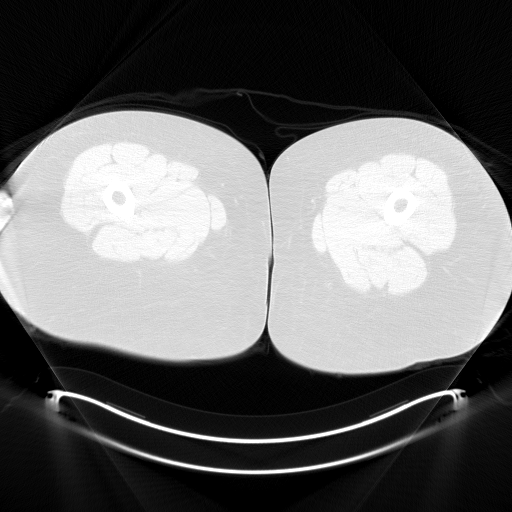
[im 50/248]
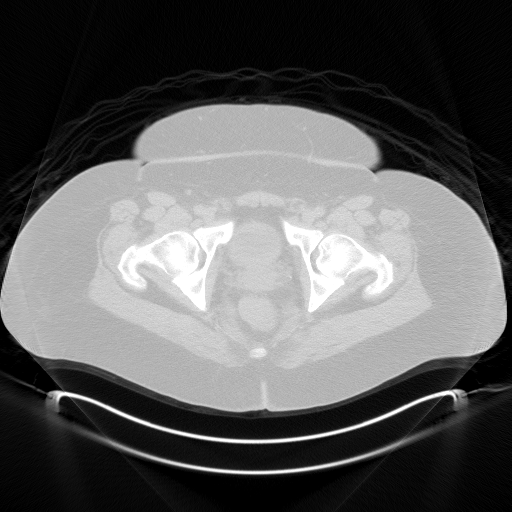
[im 99/248]
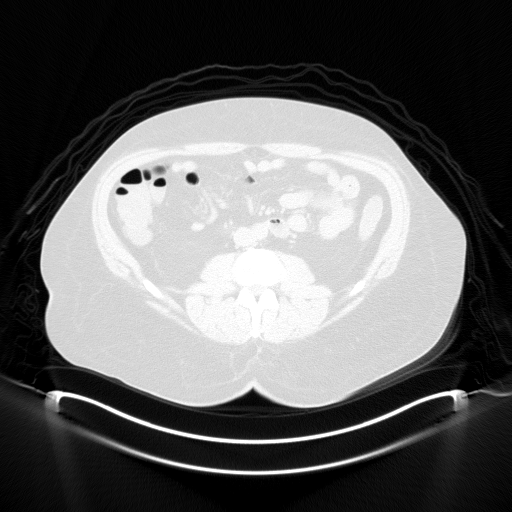
[im 149/248]
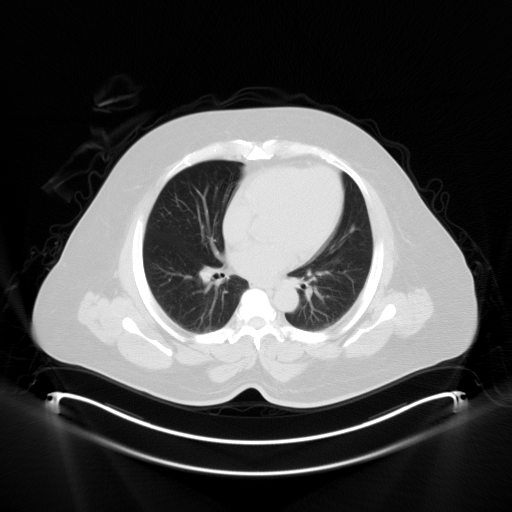
[im 198/248]
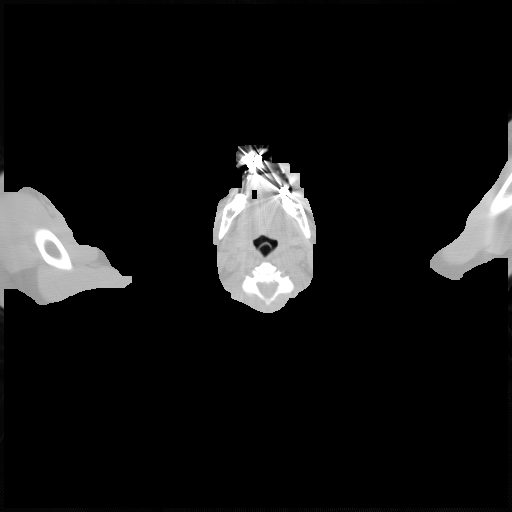
[im 248/248]
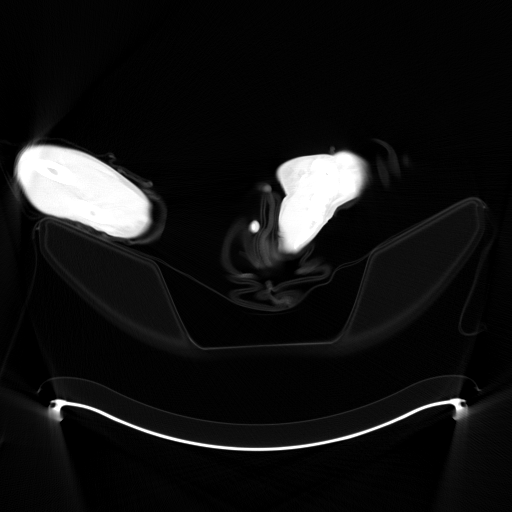

[Series 5: pet sk_thigh nac · axial · 5.0mm · 4.07mm/px · z∈[-1276,-288]mm · 6 of 248 slices shown]
[im 1/248]
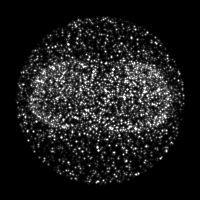
[im 50/248]
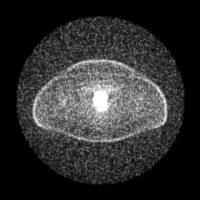
[im 99/248]
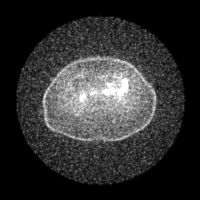
[im 149/248]
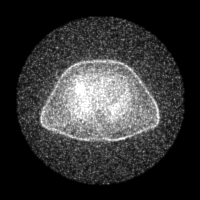
[im 198/248]
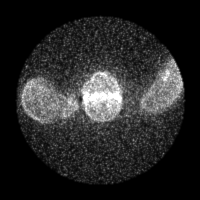
[im 248/248]
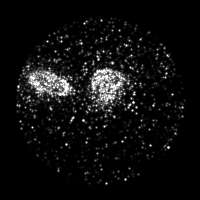

[Series 8: ct sk_thigh 5.0 (id) lung_bone · axial · 5.0mm · 0.63mm/px · 1 of 54 slices shown]
[im 1/54  brain]
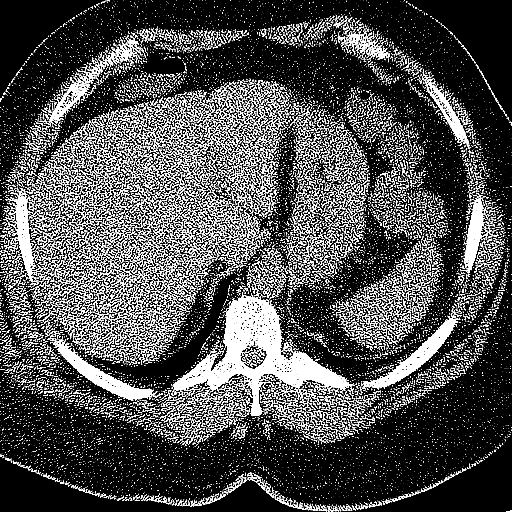

[Series 603: fused cor · 1 of 57 slices shown]
[im 1/57]
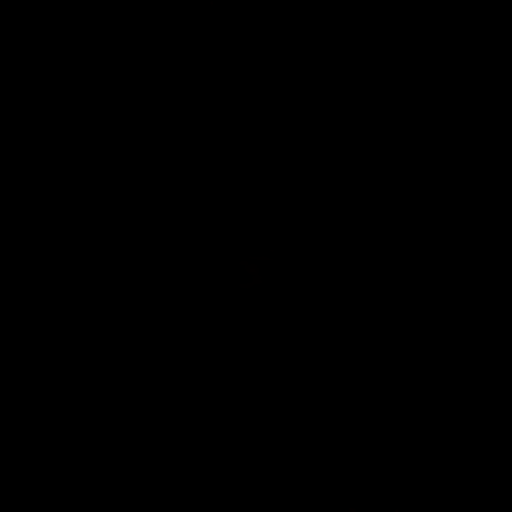

[Series 604: <mip collection> · coronal · 2.05mm/px · 1 of 32 slices shown]
[im 1/32]
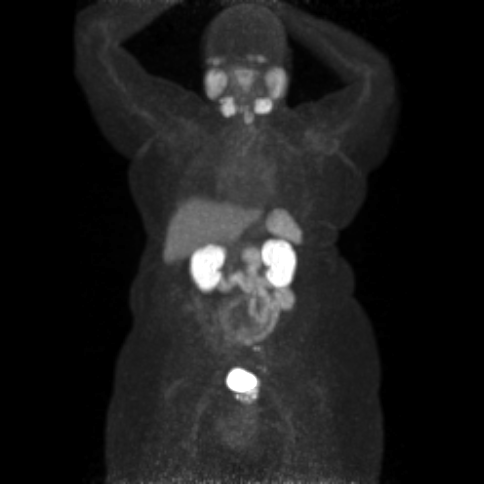

[Series 605: range-ct sk_thigh 5.0 hd_fov-tra-<alpha range> · 5 of 234 slices shown]
[im 1/234]
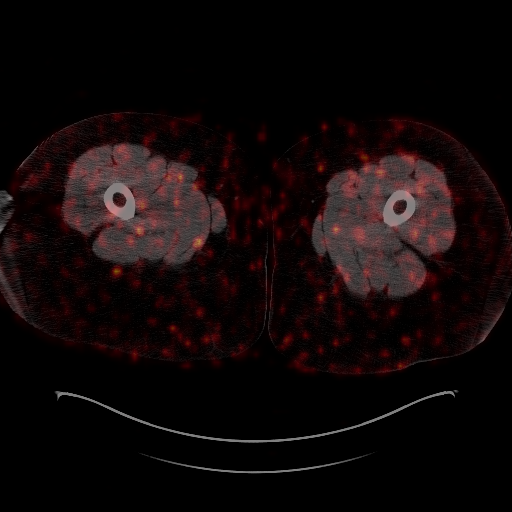
[im 59/234]
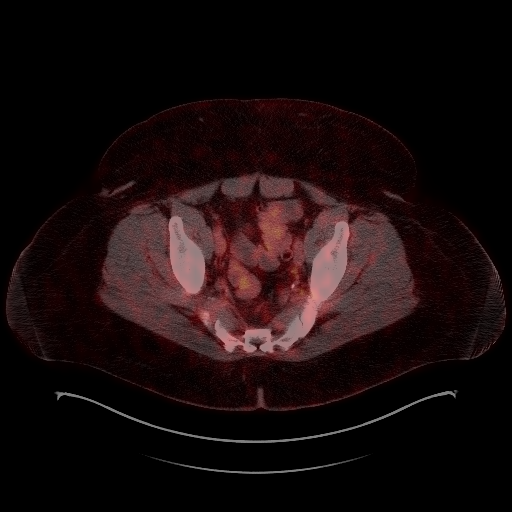
[im 117/234]
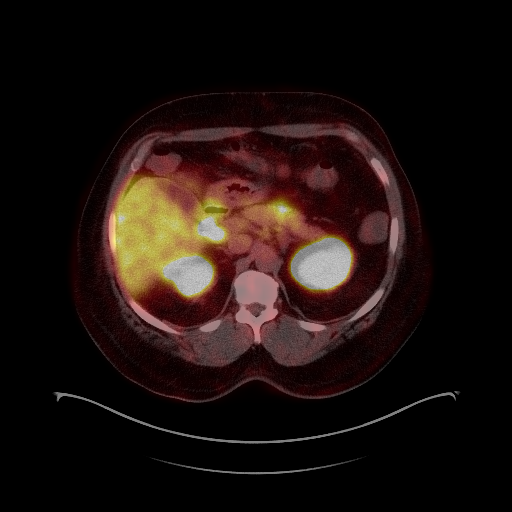
[im 175/234]
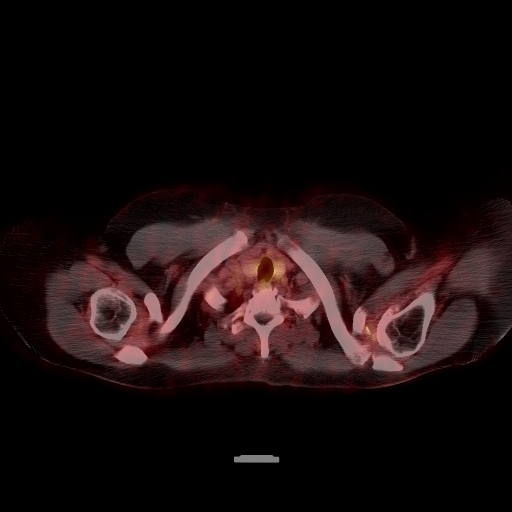
[im 234/234]
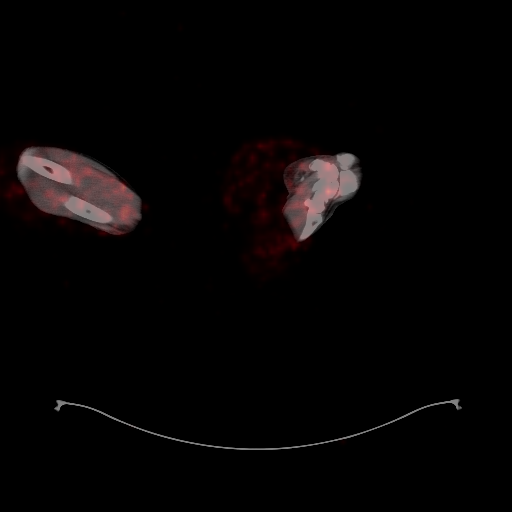

[25 of 25 positions shown; findings below may reference images not displayed]

FINDINGS: NECK

No radiotracer activity in neck lymph nodes.

Incidental CT finding: None

CHEST

No radiotracer accumulation within mediastinal or hilar lymph nodes.
No suspicious pulmonary nodules on the CT scan.

Incidental CT finding: None

ABDOMEN/PELVIS

Prostate: Marked radiotracer accumulation in the prostate gland
diffusely with a maximum SUV of 34.3 compatible with diffuse
prostate cancer.

Lymph nodes: (Image 179/4) very small lymph node along the LEFT
common iliac chain measuring 3 mm with a maximum SUV of 12.0.

(Image 189/4) 5 mm lymph node above the LEFT seminal vesicle and
along the hypogastric chain with a maximum SUV of

(Image 165/4) low LEFT retroperitoneal lymph node with a maximum SUV
of 4.5 suspicious based on location and proximity to marked
radiotracer accumulation in the LEFT common iliac lymph node.

Liver: No evidence of liver metastasis

Incidental CT finding: Colonic diverticulosis and aortic
atherosclerosis. No adenopathy by size criteria. No acute process in
the abdomen or in the pelvis. Small fat containing umbilical hernia.

SKELETON

No focal  activity to suggest skeletal metastasis.
IMPRESSION: Findings of prostate cancer diffusely throughout the prostate and
with LEFT pelvic lymph nodes showing involvement as discussed.

LEFT lower retroperitoneal lymph node also suspicious for
involvement based on proximity to marked increased activity in a
LEFT common iliac lymph node.

## 2023-06-20 ENCOUNTER — Ambulatory Visit: Payer: Medicare Other | Admitting: Internal Medicine

## 2023-06-30 ENCOUNTER — Other Ambulatory Visit: Payer: Self-pay | Admitting: Internal Medicine

## 2023-06-30 DIAGNOSIS — E1142 Type 2 diabetes mellitus with diabetic polyneuropathy: Secondary | ICD-10-CM

## 2023-07-25 ENCOUNTER — Other Ambulatory Visit: Payer: Self-pay | Admitting: Internal Medicine

## 2023-07-25 NOTE — Telephone Encounter (Signed)
Requested Prescriptions  Pending Prescriptions Disp Refills   fluticasone (FLONASE) 50 MCG/ACT nasal spray [Pharmacy Med Name: FLUTICASONE PROP 50 MCG SPRAY] 48 mL 0    Sig: SPRAY 2 SPRAYS INTO EACH NOSTRIL EVERY DAY     Ear, Nose, and Throat: Nasal Preparations - Corticosteroids Passed - 07/25/2023 10:49 AM      Passed - Valid encounter within last 12 months    Recent Outpatient Visits           8 months ago Hyperglycemia   [facility] Of [place]. [facility] [place], [place], [place]   1 year ago Type 2 diabetes mellitus with morbid obesity [facility])   [facility] Of [place]. [facility] Jonah Blue B, MD   2 years ago Type 2 diabetes mellitus with peripheral neuropathy [facility] [place])   [facility] - A Dept Of [place]. [facility] Marcine Matar, MD   2 years ago Need for influenza vaccination   [facility] Dept Of [place]. [facility] Lois Huxley, [place] L, RPH-CPP   2 years ago Type 2 diabetes mellitus with peripheral neuropathy [facility])   [facility] - A Dept Of [place]. [facility] Marcine Matar, MD       Future Appointments             In 1 week Marcine Matar, MD [facility] [place] - A Dept Of [place]igha Bridegroom. [facility]   In 4 weeks Richardo Hanks, Laurette Schimke, MD [facility] [place]

## 2023-08-02 ENCOUNTER — Ambulatory Visit: Payer: Medicare Other | Admitting: Internal Medicine

## 2023-08-02 NOTE — Progress Notes (Deleted)
Patient ID: Matthew Paulsen Sr., male    DOB: Mar 02, 1956  MRN: [id number]  CC: No chief complaint on file.   Subjective: Matthew Colon is a 68 y.o. male who presents for chronic ds management. His concerns today include:  Patient with history of HTN, DM peripheral neuropathy, HL, GERD, OA BL knees and LT shoulder, prostate CA treated with XRT and ongoing androgen deprivation therapy every 6 months. 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   DDD (degenerative disc disease), cervical 09/21/2017   DJD of acromioclavicular joint (Right) 09/21/2017   DJD of acromioclavicular joint (Bilateral) 09/21/2017   DJD of acromioclavicular joint (Left) 09/21/2017   Osteoarthritis of glenohumeral joint (Left) 09/21/2017   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/2017   Tricompartmental disease of knee (Bilateral) 09/21/2017   Osteoarthritis of knees (Bilateral) 09/21/2017   Suprapatellar effusion of knee (recurrent) (Right) 09/21/2017   Calcaneal spur of feet (Bilateral) 09/21/2017   Hypomagnesemia 09/20/2017   Low testosterone 09/20/2017   Chronic foot pain (Bilateral) 09/20/2017   Vitamin D deficiency 04/04/2017   Chronic shoulder pain (Primary Area of Pain) (Left) 03/30/2017   Chronic knee pain (Tertiary Area of Pain) (Left) 03/30/2017   Disorder of bone, unspecified 03/30/2017   Other reduced mobility 03/30/2017   Other specified health status 03/30/2017   Opioid use agreement exists 03/30/2017   Opiate use 03/30/2017   Chronic pain syndrome 03/30/2017   Chronic upper extremity pain (Secondary Area of Pain) (Bilateral) 03/30/2017   Elevated PSA  12/05/2015   Noncompliance 12/05/2015   Hypertension associated with diabetes (HCC) 03/14/2015   Type 2 diabetes mellitus with diabetic mononeuropathy (HCC) 03/14/2015   Encounter for health maintenance examination in adult 03/14/2015   Screening for prostate cancer 03/14/2015   Special screening for malignant neoplasms, colon 03/14/2015   Hyperlipidemia 03/14/2015   Nocturia 03/14/2015   Urinary frequency 03/14/2015   Need for prophylactic vaccination and inoculation against influenza 03/14/2015   Need for prophylactic vaccination against Streptococcus pneumoniae (pneumococcus) 03/14/2015   Need for Tdap vaccination 03/14/2015   Chronic nausea 03/14/2015   Gastroesophageal reflux disease without esophagitis 03/14/2015     Current Outpatient Medications on File Prior to Visit  Medication Sig Dispense Refill   atorvastatin (LIPITOR) 80 MG tablet TAKE 1 TABLET BY MOUTH EVERY DAY 90 tablet 0   calcium carbonate (OS-CAL - DOSED IN MG OF ELEMENTAL CALCIUM) 1250 (500 Ca) MG tablet Take 1 tablet by mouth daily with breakfast.     cetirizine (ZYRTEC ALLERGY) 10 MG tablet Take 1 tablet (10 mg total) by mouth daily. 100 tablet 1   cholecalciferol (VITAMIN D3) 25 MCG (1000 UNIT) tablet Take 1,000 Units by mouth daily.     ferrous sulfate 325 (65 FE) MG EC tablet Take 1 tablet (325 mg total) by mouth daily with breakfast. 30 tablet 6   fluticasone (FLONASE) 50 MCG/ACT nasal spray SPRAY 2 SPRAYS INTO EACH NOSTRIL EVERY DAY 48 mL 0   folic acid (FOLVITE) 1 MG tablet TAKE 1 TABLET BY MOUTH EVERY DAY 90 tablet 2   gabapentin (NEURONTIN) 600 MG tablet Take 1 tablet (600 mg total) by mouth 3 (three) times daily. 270 tablet 0  meloxicam (MOBIC) 7.5 MG tablet TAKE 1 TABLET (7.5 MG TOTAL) BY MOUTH DAILY. PLEASE MAKE AN APPT 90 tablet 0   metFORMIN (GLUCOPHAGE-XR) 500 MG 24 hr tablet TAKE 1 TABLET BY MOUTH EVERY DAY WITH BREAKFAST 90 tablet 0   olmesartan (BENICAR) 20 MG tablet TAKE 1 TABLET BY MOUTH EVERY  DAY 90 tablet 0   oxyCODONE (OXY IR/ROXICODONE) 5 MG immediate release tablet Take 1 tablet (5 mg total) by mouth every 6 (six) hours as needed for severe pain. 20 tablet 0   oxyCODONE-acetaminophen (PERCOCET) 10-325 MG tablet Take 1 tablet by mouth 3 (three) times daily as needed.     pantoprazole (PROTONIX) 20 MG tablet Take 1 tablet (20 mg total) by mouth daily. Must have office visit for refills 30 tablet 0   polyethylene glycol (MIRALAX / GLYCOLAX) 17 g packet Take by mouth.     senna (SENOKOT) 8.6 MG tablet Take 1 tablet (8.6 mg total) by mouth daily as needed for constipation. 30 tablet 3   sildenafil (VIAGRA) 100 MG tablet Take 1 tablet (100 mg total) by mouth daily as needed for erectile dysfunction. 30 tablet 11   tamsulosin (FLOMAX) 0.4 MG CAPS capsule Take 1 capsule (0.4 mg total) by mouth daily after supper. 90 capsule 3   No current facility-administered medications on file prior to visit.    No Known Allergies  Social History   Socioeconomic History   Marital status: Divorced    Spouse name: Not on file   Number of children: 3   Years of education: Not on file   Highest education level: Not on file  Occupational History   Occupation: switcher/ spotter/forklift  Tobacco Use   Smoking status: Some Days    Types: Cigarettes    Passive exposure: Current   Smokeless tobacco: Never  Vaping Use   Vaping status: Never Used  Substance and Sexual Activity   Alcohol use: Yes    Comment: rare beer   Drug use: No   Sexual activity: Yes  Other Topics Concern   Not on file  Social History Narrative   Lives with his sister.   Works as a Engineer, civil (consulting) in [employer], water quality.   Exercise - active on the job.  Has 3 children in [place].   Social Drivers of Corporate investment banker Strain: Low Risk  (05/17/2023)   Overall Financial Resource Strain (CARDIA)    Difficulty of Paying Living Expenses: Not hard at all  Food Insecurity: No Food Insecurity (05/17/2023)   Hunger Vital Sign     Worried About Running Out of Food in the Last Year: Never true    Ran Out of Food in the Last Year: Never true  Transportation Needs: No Transportation Needs (05/17/2023)   PRAPARE - Administrator, Civil Service (Medical): No    Lack of Transportation (Non-Medical): No  Physical Activity: Inactive (05/17/2023)   Exercise Vital Sign    Days of Exercise per Week: 0 days    Minutes of Exercise per Session: 0 min  Stress: No Stress Concern Present (05/17/2023)   [employer] of Occupational Health - Occupational Stress Questionnaire    Feeling of Stress : Not at all  Social Connections: Unknown (05/17/2023)   Social Connection and Isolation Panel [NHANES]    Frequency of Communication with Friends and Family: More than three times a week    Frequency of Social Gatherings with Friends and Family: More than three times a week    Attends Religious  Services: Not on file    Active Member of Clubs or Organizations: Yes    Attends Club or Organization Meetings: More than 4 times per year    Marital Status: Not on file  Intimate Partner Violence: Not At Risk (05/17/2023)   Humiliation, Afraid, Rape, and Kick questionnaire    Fear of Current or Ex-Partner: No    Emotionally Abused: No    Physically Abused: No    Sexually Abused: No    Family History  Problem Relation Age of Onset   Diabetes Mother    Hypertension Mother    Other Father        murdered   Diabetes Sister    Arthritis Sister    Heart disease Sister    Diabetes Sister    Diabetes Sister    Cancer Neg Hx    Stroke Neg Hx    Prostate cancer Neg Hx        unknown   Bladder Cancer Neg Hx        unkown   Kidney cancer Neg Hx        unkown    Past Surgical History:  Procedure Laterality Date   COLONOSCOPY     referral pending 03/2015   FISTULOTOMY N/A 02/11/2023   Procedure: INTERROGATION OF ANAL FISTULA WITH FISTULOTOMY;  Surgeon: Romie Levee, MD;  Location[facility];  Service:  General;  Laterality: N/A;   NO PAST SURGERIES     as of 03/2015    ROS: Review of Systems Negative except as stated above  PHYSICAL EXAM: There were no vitals taken for this visit.  Physical Exam  {male adult master:310786} {male adult master:310785}     Latest Ref Rng & Units 03/11/2023    8:06 AM 11/17/2022   10:18 AM 09/07/2022   10:04 AM  CMP  Glucose 70 - 99 mg/dL 956  213  086   BUN 8 - 23 mg/dL 23  18  17    Creatinine 0.61 - 1.24 mg/dL 5.78  4.69  6.29   Sodium 135 - 145 mmol/L 144  141  139   Potassium 3.5 - 5.1 mmol/L 4.3  4.2  4.3   Chloride 98 - 111 mmol/L 111  106  107   CO2 22 - 32 mmol/L 24  20  26    Calcium 8.9 - 10.3 mg/dL 9.6  9.6  9.1   Total Protein 6.5 - 8.1 g/dL 6.7   6.5   Total Bilirubin 0.3 - 1.2 mg/dL 0.3   0.3   Alkaline Phos 38 - 126 U/L 78   80   AST 15 - 41 U/L 15   15   ALT 0 - 44 U/L 8   7    Lipid Panel     Component Value Date/Time   CHOL 195 04/06/2022 0840   TRIG 113 04/06/2022 0840   HDL 81 04/06/2022 0840   CHOLHDL 2.4 04/06/2022 0840   CHOLHDL 2.8 03/14/2015 0001   VLDL 17 03/14/2015 0001   LDLCALC 94 04/06/2022 0840    CBC    Component Value Date/Time   WBC 8.7 03/11/2023 0806   WBC 10.9 (H) 05/06/2018 0805   RBC 3.54 (L) 03/11/2023 0806   RBC 3.51 (L) 03/11/2023 0806   HGB 10.5 (L) 03/11/2023 0806   HCT 31.1 (L) 03/11/2023 0806   PLT 284 03/11/2023 0806   MCV 88.6 03/11/2023 0806   MCH 29.9 03/11/2023 0806   MCHC 33.8 03/11/2023 0806  RDW 15.8 (H) 03/11/2023 0806   LYMPHSABS 1.5 03/11/2023 0806   MONOABS 0.5 03/11/2023 0806   EOSABS 0.4 03/11/2023 0806   BASOSABS 0.1 03/11/2023 0806    ASSESSMENT AND PLAN:  Assessment and Plan              There are no diagnoses linked to this encounter.   Patient was given the opportunity to ask questions.  Patient verbalized understanding of the plan and was able to repeat key elements of the plan.   This documentation was completed using Social research officer, government.  Any transcriptional errors are unintentional.  No orders of the defined types were placed in this encounter.    Requested Prescriptions    No prescriptions requested or ordered in this encounter    No follow-ups on file.  Jonah Blue, MD, FACP

## 2023-08-13 ENCOUNTER — Other Ambulatory Visit: Payer: Self-pay | Admitting: Internal Medicine

## 2023-08-13 DIAGNOSIS — K219 Gastro-esophageal reflux disease without esophagitis: Secondary | ICD-10-CM

## 2023-08-15 ENCOUNTER — Other Ambulatory Visit: Payer: Self-pay | Admitting: Physician Assistant

## 2023-08-15 NOTE — Telephone Encounter (Signed)
 Requested medication (s) are due for refill today: Yes  Requested medication (s) are on the active medication list: Yes  Last refill:  07/22/23  Future visit scheduled: Yes, 08/18/23  Notes to clinic:  Unable to refill per protocol, courtesy refill already given, routing for provider approval.      Requested Prescriptions  Pending Prescriptions Disp Refills   pantoprazole  (PROTONIX ) 20 MG tablet [Pharmacy Med Name: PANTOPRAZOLE  SOD DR 20 MG TAB] 90 tablet 1    Sig: TAKE 1 TABLET BY MOUTH DAILY *NEED OFFICE VISIT FOR REFILLS*     Gastroenterology: Proton Pump Inhibitors Passed - 08/15/2023 12:05 PM      Passed - Valid encounter within last 12 months    Recent Outpatient Visits           9 months ago Hyperglycemia   [facility] [place]. [facility] [place], [place], [place]   1 year ago Type 2 diabetes mellitus with morbid obesity [facility])   [facility] [place]. [facility] Concetta Dee B, MD   2 years ago Type 2 diabetes mellitus with peripheral neuropathy [facility])   [facility] [place]. [facility] Lawrance Presume, MD   2 years ago Need for influenza vaccination   [facility] Of [place]. [facility] Freada Jacobs, Romoland L, RPH-CPP   2 years ago Type 2 diabetes mellitus with peripheral neuropathy [facility])   [facility] - [facility] Dept Of [place]. [facility] Lawrance Presume, MD       Future Appointments             Tomorrow Persons, Norma Beckers, [place]   In 1 week Estanislao Heimlich, Dennard Fisher, MD [facility] [place]

## 2023-08-16 ENCOUNTER — Ambulatory Visit: Payer: Medicare Other | Admitting: Physician Assistant

## 2023-08-16 ENCOUNTER — Telehealth: Payer: Medicare Other | Admitting: Urology

## 2023-08-17 ENCOUNTER — Other Ambulatory Visit: Payer: Self-pay | Admitting: Internal Medicine

## 2023-08-17 DIAGNOSIS — K219 Gastro-esophageal reflux disease without esophagitis: Secondary | ICD-10-CM

## 2023-08-18 ENCOUNTER — Encounter: Payer: Self-pay | Admitting: Physician Assistant

## 2023-08-18 ENCOUNTER — Other Ambulatory Visit: Payer: Self-pay | Admitting: Physician Assistant

## 2023-08-18 ENCOUNTER — Ambulatory Visit: Payer: Medicare Other | Attending: Physician Assistant | Admitting: Physician Assistant

## 2023-08-18 VITALS — BP 118/76 | HR 89 | Wt 259.0 lb

## 2023-08-18 DIAGNOSIS — K219 Gastro-esophageal reflux disease without esophagitis: Secondary | ICD-10-CM | POA: Insufficient documentation

## 2023-08-18 DIAGNOSIS — R1013 Epigastric pain: Secondary | ICD-10-CM | POA: Insufficient documentation

## 2023-08-18 DIAGNOSIS — Z79899 Other long term (current) drug therapy: Secondary | ICD-10-CM | POA: Insufficient documentation

## 2023-08-18 DIAGNOSIS — I10 Essential (primary) hypertension: Secondary | ICD-10-CM | POA: Diagnosis not present

## 2023-08-18 DIAGNOSIS — G4709 Other insomnia: Secondary | ICD-10-CM | POA: Insufficient documentation

## 2023-08-18 DIAGNOSIS — J069 Acute upper respiratory infection, unspecified: Secondary | ICD-10-CM | POA: Insufficient documentation

## 2023-08-18 DIAGNOSIS — E785 Hyperlipidemia, unspecified: Secondary | ICD-10-CM | POA: Diagnosis not present

## 2023-08-18 DIAGNOSIS — R232 Flushing: Secondary | ICD-10-CM | POA: Insufficient documentation

## 2023-08-18 DIAGNOSIS — C61 Malignant neoplasm of prostate: Secondary | ICD-10-CM

## 2023-08-18 DIAGNOSIS — Z8546 Personal history of malignant neoplasm of prostate: Secondary | ICD-10-CM | POA: Insufficient documentation

## 2023-08-18 DIAGNOSIS — E1169 Type 2 diabetes mellitus with other specified complication: Secondary | ICD-10-CM | POA: Diagnosis not present

## 2023-08-18 DIAGNOSIS — I152 Hypertension secondary to endocrine disorders: Secondary | ICD-10-CM | POA: Insufficient documentation

## 2023-08-18 DIAGNOSIS — Z139 Encounter for screening, unspecified: Secondary | ICD-10-CM | POA: Diagnosis not present

## 2023-08-18 DIAGNOSIS — E1159 Type 2 diabetes mellitus with other circulatory complications: Secondary | ICD-10-CM | POA: Insufficient documentation

## 2023-08-18 DIAGNOSIS — Z7984 Long term (current) use of oral hypoglycemic drugs: Secondary | ICD-10-CM | POA: Diagnosis not present

## 2023-08-18 DIAGNOSIS — R110 Nausea: Secondary | ICD-10-CM | POA: Insufficient documentation

## 2023-08-18 LAB — POC COVID19/FLU A&B COMBO
Covid Antigen, POC: NEGATIVE
Influenza A Antigen, POC: NEGATIVE
Influenza B Antigen, POC: NEGATIVE

## 2023-08-18 MED ORDER — ONDANSETRON HCL 4 MG PO TABS: 4.0000 mg | ORAL_TABLET | Freq: Three times a day (TID) | ORAL | 0 refills | Status: AC | PRN

## 2023-08-18 MED ORDER — PANTOPRAZOLE SODIUM 20 MG PO TBEC
20.0000 mg | DELAYED_RELEASE_TABLET | Freq: Every day | ORAL | 2 refills | Status: DC
Start: 2023-08-18 — End: 2024-01-10

## 2023-08-18 MED ORDER — AZITHROMYCIN 250 MG PO TABS: ORAL_TABLET | ORAL | 0 refills | Status: AC

## 2023-08-18 MED ORDER — ATORVASTATIN CALCIUM 80 MG PO TABS
80.0000 mg | ORAL_TABLET | Freq: Every day | ORAL | 0 refills | Status: DC
Start: 2023-08-18 — End: 2023-11-15

## 2023-08-18 MED ORDER — TRAZODONE HCL 50 MG PO TABS
25.0000 mg | ORAL_TABLET | Freq: Every evening | ORAL | 3 refills | Status: DC | PRN
Start: 2023-08-18 — End: 2023-10-12

## 2023-08-18 MED ORDER — GABAPENTIN 600 MG PO TABS
600.0000 mg | ORAL_TABLET | Freq: Three times a day (TID) | ORAL | 0 refills | Status: DC
Start: 2023-08-18 — End: 2023-11-09

## 2023-08-18 MED ORDER — OLMESARTAN MEDOXOMIL 20 MG PO TABS
20.0000 mg | ORAL_TABLET | Freq: Every day | ORAL | 0 refills | Status: DC
Start: 2023-08-18 — End: 2023-10-19

## 2023-08-18 NOTE — Progress Notes (Signed)
Patient ID: Matthew Flaming Sr., male   DOB: [birth year], 68 y.o.   MRN: [id number]      Matthew Colon, is a 68 y.o. male  NWG:[id number]  VHQ:[id number]  DOB - Apr 22, 1956  Chief Complaint  Patient presents with   Medical Management of Chronic Issues    Patient states that after he eats he get hot. Cough and snezzing since last week . Needs a provider note wants off on.   Rectal Problems    Patient had surgery at [place] and was told he had 4 in tunneling that should heal up in 6 month and patient states e is still having pain        Subjective:   Matthew Colon is a 68 y.o. male here today for multiple issues concerns and complaints.  He was diagnosed with Prostate CA about 2-3 years ago and is on injectable hormones.  He is unsure of the name of it but it is every 6 months and he has completed 3 of the injections and is due for another one.  Sees urology next weeks.    Hot flashes worsening over the last few months.  Also more dyspepsia and sometimes nausea with eating.  Wants to be tested for ulcers.    Needs something to help with sleep.  He has been out of gabapentin which did help a little when he was taking it.  He would like something else to help and needs gabapentin RF.    Over the past week he has had cough, runny nose, subjective fevers.  Mucus clear.  Some body aches.  Missed work a few days last week and this week and is asking for a note to go back middle of next week.    No problems updated.  ALLERGIES: No Known Allergies  PAST MEDICAL HISTORY: Past Medical History:  Diagnosis Date   Anal fistula    Arthritis    Foot pain, bilateral    referred to podiatry 06/2014   GERD (gastroesophageal reflux disease)    long history of   Hyperlipidemia    Hypertension    Polyarthralgia    shoulders, knees, ankles, wrists; neg rheum lab screen 04/2014   Prostate cancer (HCC)    Type 2 diabetes mellitus with diabetic neuropathy (HCC)    Wears glasses    Wears  partial dentures upper     MEDICATIONS AT HOME: Prior to Admission medications   Medication Sig Start Date End Date Taking? Authorizing Provider  traZODone (DESYREL) 50 MG tablet Take 0.5-1 tablets (25-50 mg total) by mouth at bedtime as needed for sleep. 08/18/23  Yes Georgian Co M, PA-C  atorvastatin (LIPITOR) 80 MG tablet Take 1 tablet (80 mg total) by mouth daily. 08/18/23   Anders Simmonds, PA-C  calcium carbonate (OS-CAL - DOSED IN MG OF ELEMENTAL CALCIUM) 1250 (500 Ca) MG tablet Take 1 tablet by mouth daily with breakfast.    [provider]  cetirizine (ZYRTEC ALLERGY) 10 MG tablet Take 1 tablet (10 mg total) by mouth daily. 11/17/22   Anders Simmonds, PA-C  cholecalciferol (VITAMIN D3) 25 MCG (1000 UNIT) tablet Take 1,000 Units by mouth daily.    [provider]  ferrous sulfate 325 (65 FE) MG EC tablet Take 1 tablet (325 mg total) by mouth daily with breakfast. 09/20/22   Georga Kaufmann T, PA-C  fluticasone [facility]) 50 MCG/ACT nasal spray SPRAY 2 SPRAYS INTO EACH NOSTRIL EVERY DAY 07/25/23   Marcine Matar, MD  folic  acid (FOLVITE) 1 MG tablet TAKE 1 TABLET BY MOUTH EVERY DAY 03/11/23   Jaci Standard, MD  gabapentin (NEURONTIN) 600 MG tablet Take 1 tablet (600 mg total) by mouth 3 (three) times daily. 08/18/23   Anders Simmonds, PA-C  meloxicam (MOBIC) 7.5 MG tablet TAKE 1 TABLET (7.5 MG TOTAL) BY MOUTH DAILY. PLEASE MAKE AN APPT 03/10/23   Marcine Matar, MD  metFORMIN (GLUCOPHAGE-XR) 500 MG 24 hr tablet TAKE 1 TABLET BY MOUTH EVERY DAY WITH BREAKFAST 06/30/23   Marcine Matar, MD  olmesartan (BENICAR) 20 MG tablet Take 1 tablet (20 mg total) by mouth daily. 08/18/23   Anders Simmonds, PA-C  oxyCODONE (OXY IR/ROXICODONE) 5 MG immediate release tablet Take 1 tablet (5 mg total) by mouth every 6 (six) hours as needed for severe pain. 02/11/23   Romie Levee, MD  oxyCODONE-acetaminophen (PERCOCET) 10-325 MG tablet Take 1 tablet by mouth 3 (three) times  daily as needed. 11/27/22   [provider]  pantoprazole (PROTONIX) 20 MG tablet Take 1 tablet (20 mg total) by mouth daily. 08/18/23   Anders Simmonds, PA-C  polyethylene glycol (MIRALAX / GLYCOLAX) 17 g packet Take by mouth. 07/31/20   [provider]  senna (SENOKOT) 8.6 MG tablet Take 1 tablet (8.6 mg total) by mouth daily as needed for constipation. 07/21/21   Marcine Matar, MD  sildenafil (VIAGRA) 100 MG tablet Take 1 tablet (100 mg total) by mouth daily as needed for erectile dysfunction. 08/10/22   Sondra Come, MD  tamsulosin (FLOMAX) 0.4 MG CAPS capsule Take 1 capsule (0.4 mg total) by mouth daily after supper. 05/10/23   Sondra Come, MD    ROS:  Neg cardiac Neg GU Neg MS Neg psych Neg neuro  Objective:   Vitals:   08/18/23 0852  BP: 118/76  Pulse: 89  SpO2: 100%  Weight: 259 lb (117.5 kg)   Exam General appearance : Awake, alert, not in any distress. Speech Clear. Not toxic looking HEENT: Atraumatic and Normocephalic Neck: Supple, no JVD. No cervical lymphadenopathy.  Chest: Good air entry bilaterally, CTAB.  No rales/rhonchi/wheezing CVS: S1 S2 regular, no murmurs.  Extremities: B/L Lower Ext shows no edema, both legs are warm to touch Neurology: Awake alert, and oriented X 3, CN II-XII intact, Non focal Skin: No Rash  Data Review Lab Results  Component Value Date   HGBA1C 5.8 11/17/2022   HGBA1C 5.9 01/21/2022   HGBA1C 6.3 07/21/2021    Assessment & Plan   1. Hypertension associated with diabetes (HCC) - olmesartan (BENICAR) 20 MG tablet; Take 1 tablet (20 mg total) by mouth daily.  Dispense: 90 tablet; Refill: 0 - TSH - Comprehensive metabolic panel  2. Gastroesophageal reflux disease without esophagitis - pantoprazole (PROTONIX) 20 MG tablet; Take 1 tablet (20 mg total) by mouth daily.  Dispense: 30 tablet; Refill: 2 - CBC with Differential - H. pylori breath test - Comprehensive metabolic panel  3. Hyperlipidemia  associated with type 2 diabetes mellitus (HCC) - atorvastatin (LIPITOR) 80 MG tablet; Take 1 tablet (80 mg total) by mouth daily.  Dispense: 90 tablet; Refill: 0  4. Hot flashes (Primary) Discuss with radiation oncologist and urology(whichever prescribed the injections and follows him for cancer. - gabapentin (NEURONTIN) 600 MG tablet; Take 1 tablet (600 mg total) by mouth 3 (three) times daily.  Dispense: 270 tablet; Refill: 0 - CBC with Differential - TSH - Comprehensive metabolic panel  5. Dyspepsia - CBC with  Differential - TSH - H. pylori breath test - Comprehensive metabolic panel  6. Prostate cancer (HCC) - CBC with Differential - TSH - H. pylori breath test  7. Upper respiratory tract infection, unspecified type Fluids, rest, respiratory care - POC Covid19/Flu A&B Antigen  8. Other insomnia - traZODone (DESYREL) 50 MG tablet; Take 0.5-1 tablets (25-50 mg total) by mouth at bedtime as needed for sleep.  Dispense: 30 tablet; Refill: 3 - TSH    Return in about 3 months (around 11/15/2023) for PCP for chronic conditions-[place].  The patient was given clear instructions to go to ER or return to medical center if symptoms don't improve, worsen or new problems develop. The patient verbalized understanding. The patient was told to call to get lab results if they haven't heard anything in the next week.      Georgian Co, PA-C [facility] and [facility] [place], [place] [phone]   08/18/2023, 9:34 AM

## 2023-08-19 ENCOUNTER — Encounter: Payer: Self-pay | Admitting: Physician Assistant

## 2023-08-19 ENCOUNTER — Ambulatory Visit: Payer: Medicare Other | Admitting: Physician Assistant

## 2023-08-19 ENCOUNTER — Telehealth: Payer: Self-pay

## 2023-08-19 LAB — COMPREHENSIVE METABOLIC PANEL
ALT: 10 [IU]/L (ref 0–44)
AST: 15 [IU]/L (ref 0–40)
Albumin: 4.2 g/dL (ref 3.9–4.9)
Alkaline Phosphatase: 104 [IU]/L (ref 44–121)
BUN/Creatinine Ratio: 16 (ref 10–24)
BUN: 16 mg/dL (ref 8–27)
Bilirubin Total: 0.5 mg/dL (ref 0.0–1.2)
CO2: 22 mmol/L (ref 20–29)
Calcium: 10.4 mg/dL — ABNORMAL HIGH (ref 8.6–10.2)
Chloride: 106 mmol/L (ref 96–106)
Creatinine, Ser: 1 mg/dL (ref 0.76–1.27)
Globulin, Total: 2.4 g/dL (ref 1.5–4.5)
Glucose: 115 mg/dL — ABNORMAL HIGH (ref 70–99)
Potassium: 4.9 mmol/L (ref 3.5–5.2)
Sodium: 142 mmol/L (ref 134–144)
Total Protein: 6.6 g/dL (ref 6.0–8.5)
eGFR: 83 mL/min/{1.73_m2} (ref >59)

## 2023-08-19 LAB — CBC WITH DIFFERENTIAL/PLATELET
Basophils Absolute: 0 10*3/uL (ref 0.0–0.2)
Basos: 0 %
EOS (ABSOLUTE): 0.3 10*3/uL (ref 0.0–0.4)
Eos: 4 %
Hematocrit: 36.2 % — ABNORMAL LOW (ref 37.5–51.0)
Hemoglobin: 11.6 g/dL — ABNORMAL LOW (ref 13.0–17.7)
Immature Grans (Abs): 0 10*3/uL (ref 0.0–0.1)
Immature Granulocytes: 0 %
Lymphocytes Absolute: 1.2 10*3/uL (ref 0.7–3.1)
Lymphs: 16 %
MCH: 29.1 pg (ref 26.6–33.0)
MCHC: 32 g/dL (ref 31.5–35.7)
MCV: 91 fL (ref 79–97)
Monocytes Absolute: 0.4 10*3/uL (ref 0.1–0.9)
Monocytes: 5 %
Neutrophils Absolute: 5.7 10*3/uL (ref 1.4–7.0)
Neutrophils: 75 %
Platelets: 356 10*3/uL (ref 150–450)
RBC: 3.99 x10E6/uL — ABNORMAL LOW (ref 4.14–5.80)
RDW: 13.2 % (ref 11.6–15.4)
WBC: 7.6 10*3/uL (ref 3.4–10.8)

## 2023-08-19 LAB — H. PYLORI BREATH TEST: H pylori Breath Test: NEGATIVE

## 2023-08-19 LAB — H. PYLORI BREATH COLLECTION

## 2023-08-19 LAB — TSH: TSH: 1.54 u[IU]/mL (ref 0.450–4.500)

## 2023-08-19 NOTE — Telephone Encounter (Signed)
-----   Message from Georgian Co sent at 08/19/2023  2:06 PM EST ----- Thus far your lab work is reassuring.  Your hemoglobin is a little better than last labs and is stable which is good! Kidney, liver, electrolyte levels are normal.  Thyroid is normal.  Stomach ulcer test is not back yet.  I still want you to schedule your gastroenterology appt as we discussed.  I will notify you when stomach ulcer tests are back.  Thanks, Georgian Co, PA-C

## 2023-08-19 NOTE — Telephone Encounter (Signed)
Patient viewed results and Doctor comment through [facility]

## 2023-08-19 NOTE — Addendum Note (Signed)
Addended by: Dalene Carrow I on: 08/19/2023 01:59 PM   Modules accepted: Orders

## 2023-08-22 ENCOUNTER — Telehealth: Payer: Self-pay

## 2023-08-22 NOTE — Telephone Encounter (Signed)
-----   Message from Georgian Co sent at 08/22/2023 11:53 AM EST ----- Please call patient and let him know the stomach ulcer test was negative. Thanks! Georgian Co

## 2023-08-22 NOTE — Telephone Encounter (Signed)
 Pt was called and is aware of results, DOB was confirmed.  ?

## 2023-08-23 ENCOUNTER — Telehealth (INDEPENDENT_AMBULATORY_CARE_PROVIDER_SITE_OTHER): Payer: Medicare Other | Admitting: Urology

## 2023-08-23 DIAGNOSIS — R399 Unspecified symptoms and signs involving the genitourinary system: Secondary | ICD-10-CM

## 2023-08-23 DIAGNOSIS — N529 Male erectile dysfunction, unspecified: Secondary | ICD-10-CM

## 2023-08-23 DIAGNOSIS — C61 Malignant neoplasm of prostate: Secondary | ICD-10-CM

## 2023-08-23 MED ORDER — TADALAFIL 20 MG PO TABS
20.0000 mg | ORAL_TABLET | Freq: Every day | ORAL | 6 refills | Status: DC | PRN
Start: 2023-08-23 — End: 2023-08-29

## 2023-08-23 MED ORDER — ALFUZOSIN HCL ER 10 MG PO TB24: 10.0000 mg | ORAL_TABLET | Freq: Every day | ORAL | 11 refills | Status: DC

## 2023-08-23 NOTE — Progress Notes (Signed)
Virtual Visit via AUDIO+VIDEO  Note  I connected with Matthew Flaming Sr. on 08/23/23 at 11:30 AM EST by video and verified that I am speaking with the correct person using two identifiers.   Patient location: Home Provider location: [facility]   I discussed the limitations, risks, security and privacy concerns of performing an evaluation and management service by telephone and the availability of in person appointments. We discussed the impact of the COVID-19 pandemic on the healthcare system, and the importance of social distancing and reducing patient and provider exposure. I also discussed with the patient that there may be a patient responsible charge related to this service. The patient expressed understanding and agreed to proceed.  Reason for visit: Advanced prostate cancer, ED, urinary symptoms  History of Present Illness: 68 year old male who resides in [place] and gets his care through [place] as the group in [place] does not accept his insurance.  Transportation is an issue for him, and we have been doing virtual visit yearly.  He presented with an elevated PSA of 17.6 and prostate MRI was grossly abnormal in September 2022, he was diagnosed on biopsy with grade group 5 disease with max core involvement of 100%, and PSMA PET scan suggested possible metastatic disease in the left pelvic nodes and left lower retroperitoneal nodes.  He was treated with curative intent with radiation to the prostate and nodes locally in [facility] in May 2023 as well as 2 years of ADT, and his final ADT injection is scheduled with oncology for February 2025.  Initial post radiation PSA was 0.3.  He continues to follow-up with oncology(Dr. Leonides Schanz) in [place].  Most recent PSA is stable at 0.2 from September 2024.  We continue to follow him for urinary symptoms on Flomax and ED on sildenafil.  He reports no significant urinary problems when he takes the Flomax, but if he misses a few  doses he notices a weak stream and difficulty urinating.  He also has bothersome retrograde ejaculation.  He is on max dose 100 mg sildenafil, having only moderate improvement and interested in other options.  Follow Up:  -Trial of Cialis 20 mg on demand for ED to replace sildenafil -Trial of alfuzosin 10 mg to replace Flomax to improve retrograde ejaculation -Continue follow-up with oncology for advanced prostate cancer, final dose of ADT upcoming in February 2025, will need to closely monitor PSA, but PSA trend has overall been very reassuring   I discussed the assessment and treatment plan with the patient. The patient was provided an opportunity to ask questions and all were answered. The patient agreed with the plan and demonstrated an understanding of the instructions.   The patient was advised to call back or seek an in-person evaluation if the symptoms worsen or if the condition fails to improve as anticipated.  I provided 13 minutes of non-face-to-face time during this encounter.   Sondra Come, MD

## 2023-08-24 ENCOUNTER — Telehealth: Payer: Self-pay | Admitting: *Deleted

## 2023-08-24 ENCOUNTER — Ambulatory Visit: Payer: Medicare Other | Admitting: Physician Assistant

## 2023-08-24 NOTE — Progress Notes (Signed)
Complex Care Management Note Care Guide Note  08/24/2023 Name: Matthew Windsor Sr. MRN: [id number] DOB: 12-02-1955   Complex Care Management Outreach Attempts: An unsuccessful telephone outreach was attempted today to offer the patient information about available complex care management services.  Follow Up Plan:  Additional outreach attempts will be made to offer the patient complex care management information and services.   Encounter Outcome:  No Answer  Gwenevere Ghazi  [facility]  [facility] Guide  Direct Dial: [phone]  Fax [phone]

## 2023-08-26 NOTE — Progress Notes (Signed)
Complex Care Management Note Care Guide Note  08/26/2023 Name: Matthew Fitzhenry Sr. MRN: [id number] DOB: 22-Jun-1956   Complex Care Management Outreach Attempts: A second unsuccessful outreach was attempted today to offer the patient with information about available complex care management services.  Follow Up Plan:  Additional outreach attempts will be made to offer the patient complex care management information and services.   Encounter Outcome:  No Answer  Gwenevere Ghazi  [facility]  [facility], [facility] Guide  Direct Dial: [phone]  Fax [phone]

## 2023-08-29 MED ORDER — TADALAFIL 20 MG PO TABS: 20.0000 mg | ORAL_TABLET | Freq: Every day | ORAL | 6 refills | Status: DC | PRN

## 2023-08-29 MED ORDER — ALFUZOSIN HCL ER 10 MG PO TB24: 10.0000 mg | ORAL_TABLET | Freq: Every day | ORAL | 11 refills | Status: DC

## 2023-08-29 NOTE — Addendum Note (Signed)
 Addended by: Consuella Lose on: 08/29/2023 02:30 PM   Modules accepted: Orders

## 2023-08-31 NOTE — Progress Notes (Signed)
 Complex Care Management Note Care Guide Note  08/31/2023 Name: Matthew Casanova Sr. MRN: [id number] DOB: [birth year]   Complex Care Management Outreach Attempts: A third unsuccessful outreach was attempted today to offer the patient with information about available complex care management services.  Follow Up Plan:  No further outreach attempts will be made at this time. We have been unable to contact the patient to offer or enroll patient in complex care management services.  Encounter Outcome:  No Answer  Gwenevere Ghazi  [facility]  [facility], [facility] Guide  Direct Dial: [phone]  Fax [phone]

## 2023-09-01 ENCOUNTER — Other Ambulatory Visit: Payer: Self-pay | Admitting: Physician Assistant

## 2023-09-01 DIAGNOSIS — C61 Malignant neoplasm of prostate: Secondary | ICD-10-CM

## 2023-09-05 ENCOUNTER — Other Ambulatory Visit: Payer: Self-pay | Admitting: Internal Medicine

## 2023-09-05 ENCOUNTER — Other Ambulatory Visit: Payer: Self-pay | Admitting: Physician Assistant

## 2023-09-05 DIAGNOSIS — E1142 Type 2 diabetes mellitus with diabetic polyneuropathy: Secondary | ICD-10-CM

## 2023-09-09 ENCOUNTER — Inpatient Hospital Stay (HOSPITAL_BASED_OUTPATIENT_CLINIC_OR_DEPARTMENT_OTHER): Payer: Medicare Other | Admitting: Physician Assistant

## 2023-09-09 ENCOUNTER — Inpatient Hospital Stay: Payer: Medicare Other | Attending: Internal Medicine

## 2023-09-09 ENCOUNTER — Ambulatory Visit: Payer: Medicare Other

## 2023-09-09 ENCOUNTER — Other Ambulatory Visit: Payer: Medicare Other

## 2023-09-09 ENCOUNTER — Ambulatory Visit: Payer: Medicare Other | Admitting: Hematology and Oncology

## 2023-09-09 ENCOUNTER — Inpatient Hospital Stay: Payer: Medicare Other

## 2023-09-09 ENCOUNTER — Other Ambulatory Visit: Payer: Self-pay | Admitting: Physician Assistant

## 2023-09-09 VITALS — BP 136/64 | HR 79 | Temp 98.1°F | Resp 16 | Wt 257.2 lb

## 2023-09-09 DIAGNOSIS — D649 Anemia, unspecified: Secondary | ICD-10-CM | POA: Insufficient documentation

## 2023-09-09 DIAGNOSIS — C61 Malignant neoplasm of prostate: Secondary | ICD-10-CM | POA: Insufficient documentation

## 2023-09-09 DIAGNOSIS — Z5111 Encounter for antineoplastic chemotherapy: Secondary | ICD-10-CM | POA: Diagnosis present

## 2023-09-09 DIAGNOSIS — C775 Secondary and unspecified malignant neoplasm of intrapelvic lymph nodes: Secondary | ICD-10-CM | POA: Insufficient documentation

## 2023-09-09 DIAGNOSIS — G4709 Other insomnia: Secondary | ICD-10-CM

## 2023-09-09 LAB — CBC WITH DIFFERENTIAL (CANCER CENTER ONLY)
Abs Immature Granulocytes: 0.03 10*3/uL (ref 0.00–0.07)
Basophils Absolute: 0 10*3/uL (ref 0.0–0.1)
Basophils Relative: 1 %
Eosinophils Absolute: 0.3 10*3/uL (ref 0.0–0.5)
Eosinophils Relative: 4 %
HCT: 31.4 % — ABNORMAL LOW (ref 39.0–52.0)
Hemoglobin: 10.5 g/dL — ABNORMAL LOW (ref 13.0–17.0)
Immature Granulocytes: 0 %
Lymphocytes Relative: 18 %
Lymphs Abs: 1.2 10*3/uL (ref 0.7–4.0)
MCH: 29.1 pg (ref 26.0–34.0)
MCHC: 33.4 g/dL (ref 30.0–36.0)
MCV: 87 fL (ref 80.0–100.0)
Monocytes Absolute: 0.5 10*3/uL (ref 0.1–1.0)
Monocytes Relative: 7 %
Neutro Abs: 4.9 10*3/uL (ref 1.7–7.7)
Neutrophils Relative %: 70 %
Platelet Count: 357 10*3/uL (ref 150–400)
RBC: 3.61 MIL/uL — ABNORMAL LOW (ref 4.22–5.81)
RDW: 14.9 % (ref 11.5–15.5)
WBC Count: 7 10*3/uL (ref 4.0–10.5)
nRBC: 0 % (ref 0.0–0.2)

## 2023-09-09 LAB — CMP (CANCER CENTER ONLY)
ALT: 7 U/L (ref 0–44)
AST: 17 U/L (ref 15–41)
Albumin: 4 g/dL (ref 3.5–5.0)
Alkaline Phosphatase: 76 U/L (ref 38–126)
Anion gap: 9 (ref 5–15)
BUN: 20 mg/dL (ref 8–23)
CO2: 23 mmol/L (ref 22–32)
Calcium: 9.4 mg/dL (ref 8.9–10.3)
Chloride: 109 mmol/L (ref 98–111)
Creatinine: 1.29 mg/dL — ABNORMAL HIGH (ref 0.61–1.24)
GFR, Estimated: >60 mL/min (ref >60)
Glucose, Bld: 88 mg/dL (ref 70–99)
Potassium: 3.8 mmol/L (ref 3.5–5.1)
Sodium: 141 mmol/L (ref 135–145)
Total Bilirubin: 0.4 mg/dL (ref 0.0–1.2)
Total Protein: 6.9 g/dL (ref 6.5–8.1)

## 2023-09-09 LAB — IRON AND IRON BINDING CAPACITY (CC-WL,HP ONLY)
Iron: 49 ug/dL (ref 45–182)
Saturation Ratios: 14 % — ABNORMAL LOW (ref 17.9–39.5)
TIBC: 343 ug/dL (ref 250–450)
UIBC: 294 ug/dL (ref 117–376)

## 2023-09-09 LAB — FERRITIN: Ferritin: 86 ng/mL (ref 24–336)

## 2023-09-09 MED ORDER — LEUPROLIDE ACETATE (6 MONTH) 45 MG ~~LOC~~ KIT
45.0000 mg | PACK | Freq: Once | SUBCUTANEOUS | Status: AC
  Administered 2023-09-09: 45 mg via SUBCUTANEOUS
  Filled 2023-09-09: qty 45

## 2023-09-09 NOTE — Patient Instructions (Signed)

## 2023-09-09 NOTE — Progress Notes (Signed)
 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   ONCOLOGIC HISTORY: Initially presented with elevated PSA dating back to at least 2016, at which time it was 9 and increased further to 9.6 in 2017. Found to have elevated PSA of 17.6 throught his PCP.  02/05/2021: Established care with urologist, Dr. Richardo Hanks 03/13/2021: MRI: Diffuse abnormal signal throughout the peripheral zone, commonly seen in setting of prostatitis, however there were areas that appeared to extend beyond capsule at the base (PI-RADS 3, with some concern for diffuse prostate neoplasm). There was indeterminate low signal extending to the seminal vesicles but no evidence of pelvic metastatic disease or bony lesions.   07/03/2021: Underwent transrectal ultrasound with 12 biopsies of the prostate on 07/03/21.  The prostate volume measured 31 cc.  Out of 12 core biopsies, all 12 were positive.  The maximum Gleason score was 5+5, and this was seen in the right lateral base and right lateral mid. Additionally, Gleason 5+4 was seen in the left lateral apex, left lateral mid, left lateral base, right base, left apex, left mid, and left base, Gleason 4+5 was seen in the right mid, and Gleason 3+4 in the right apex and right lateral apex. Per diagnostic comment, "the right apex and right lateral apex biopsies contain adenocarcinoma with mostly Gleason pattern 3, with discontinuous foci of pattern 4 and 5. This finding does not change the overall diagnosis  of extensive grade group 5 disease."  07/30/2021: Bone scan showed no evidence of osseous metastases.  08/11/2021: Started Eligard 45 mg injection every 6 months. 11/02/2021-12/28/2021: Received curative,definitive radiation to the prostate, seminal vesicles and pelvic lymph nodes initially to 45 Gy in 25 fractions of 1.8 Gy.The prostate and PET-positive nodes were boosted to 75 Gy with 15 additional fractions of 2.0 Gy    HISTORY OF PRESENTING ILLNESS:  Matthew Flaming Sr. 68 y.o. male returns for a follow up for prostate cancer with left pelvic lymphadenopathy. He was last seen on 9/062024 by Dr. Leonides Schanz. In the interim, he has not had any major changes in his health. He is due for his next Eligard injection today.   On exam today, Matthew Colon reports he still struggles with fatigue although he continues to go to work and complete his ADLs. His appetite is fairly stable but he questioned if he should incorporate more protein into his diet. He does have some nausea without vomiting. His PCP recently prescribed zofran which he has not started. He denies abdominal pain and bowel movements are stable. He denies easy bruising or signs of bleeding. He denies fevers, chills, sweats, shortness of breath, chest pain or cough, urinary symptoms or edema. He has no other complaints.Rest of the 10 point ROS is below.   MEDICAL HISTORY:  Past Medical History:  Diagnosis Date   Anal fistula    Arthritis    Foot pain, bilateral    referred to podiatry 06/2014   GERD (gastroesophageal reflux disease)    long history of   Hyperlipidemia    Hypertension    Polyarthralgia    shoulders, knees, ankles, wrists;  neg rheum lab screen 04/2014   Prostate cancer (HCC)    Type 2 diabetes mellitus with diabetic neuropathy (HCC)    Wears glasses    Wears partial dentures upper     SURGICAL HISTORY: Past Surgical History:  Procedure Laterality Date   COLONOSCOPY     referral pending 03/2015   FISTULOTOMY N/A  02/11/2023   Procedure: INTERROGATION OF ANAL FISTULA WITH FISTULOTOMY;  Surgeon: Romie Levee, MD;  Location: [facility];  Service: General;  Laterality: N/A;   NO PAST SURGERIES     as of 03/2015    SOCIAL HISTORY: Social History   Socioeconomic History   Marital status: Divorced    Spouse name: Not on file   Number of children: 3   Years of education: Not on file   Highest education level: Not on file  Occupational History   Occupation: switcher/ spotter/forklift  Tobacco Use   Smoking status: Some Days    Types: Cigarettes    Passive exposure: Current   Smokeless tobacco: Never  Vaping Use   Vaping status: Never Used  Substance and Sexual Activity   Alcohol use: Yes    Comment: rare beer   Drug use: No   Sexual activity: Yes  Other Topics Concern   Not on file  Social History Narrative   Lives with his sister.   Works as a Engineer, civil (consulting) in [employer], [employer].   Exercise - active on the job.  Has 3 children in [place].   Social Drivers of Health   Financial Resource Strain: Medium Risk (08/19/2023)   Overall Financial Resource Strain (CARDIA)    Difficulty of Paying Living Expenses: Somewhat hard  Food Insecurity: No Food Insecurity (08/19/2023)   Hunger Vital Sign    Worried About Running Out of Food in the Last Year: Never true    Ran Out of Food in the Last Year: Never true  Transportation Needs: No Transportation Needs (08/19/2023)   PRAPARE - Administrator, Civil Service (Medical): No    Lack of Transportation (Non-Medical): No  Physical Activity: Insufficiently Active (08/19/2023)   Exercise Vital Sign    Days of Exercise per Week: 2 days    Minutes of Exercise per Session: 30 min  Stress: Stress Concern Present (08/19/2023)   [employer] of Occupational Health - Occupational Stress Questionnaire    Feeling of Stress : Rather much  Social Connections: Socially Isolated (08/19/2023)   Social Connection and Isolation Panel [NHANES]     Frequency of Communication with Friends and Family: Once a week    Frequency of Social Gatherings with Friends and Family: Never    Attends Religious Services: Never    Database administrator or Organizations: No    Attends Banker Meetings: Never    Marital Status: Divorced  Catering manager Violence: Not At Risk (05/17/2023)   Humiliation, Afraid, Rape, and Kick questionnaire    Fear of Current or Ex-Partner: No    Emotionally Abused: No    Physically Abused: No    Sexually Abused: No    FAMILY HISTORY: Family History  Problem Relation Age of Onset   Diabetes Mother    Hypertension Mother    Other Father        murdered   Diabetes Sister    Arthritis Sister    Heart disease Sister    Diabetes Sister    Diabetes Sister    Cancer Neg Hx    Stroke Neg Hx  Prostate cancer Neg Hx        unknown   Bladder Cancer Neg Hx        unkown   Kidney cancer Neg Hx        unkown    ALLERGIES:  has no known allergies.  MEDICATIONS:  Current Outpatient Medications  Medication Sig Dispense Refill   alfuzosin (UROXATRAL) 10 MG 24 hr tablet Take 1 tablet (10 mg total) by mouth daily with breakfast. 30 tablet 11   atorvastatin (LIPITOR) 80 MG tablet Take 1 tablet (80 mg total) by mouth daily. 90 tablet 0   calcium carbonate (OS-CAL - DOSED IN MG OF ELEMENTAL CALCIUM) 1250 (500 Ca) MG tablet Take 1 tablet by mouth daily with breakfast.     cetirizine (ZYRTEC ALLERGY) 10 MG tablet Take 1 tablet (10 mg total) by mouth daily. 100 tablet 1   cholecalciferol (VITAMIN D3) 25 MCG (1000 UNIT) tablet Take 1,000 Units by mouth daily.     ferrous sulfate 325 (65 FE) MG EC tablet Take 1 tablet (325 mg total) by mouth daily with breakfast. 30 tablet 6   fluticasone (FLONASE) 50 MCG/ACT nasal spray SPRAY 2 SPRAYS INTO EACH NOSTRIL EVERY DAY 48 mL 0   folic acid (FOLVITE) 1 MG tablet TAKE 1 TABLET BY MOUTH EVERY DAY 90 tablet 2   gabapentin (NEURONTIN) 600 MG tablet Take 1 tablet (600 mg  total) by mouth 3 (three) times daily. 270 tablet 0   meloxicam (MOBIC) 7.5 MG tablet TAKE 1 TABLET (7.5 MG TOTAL) BY MOUTH DAILY. PLEASE MAKE AN APPT 90 tablet 0   metFORMIN (GLUCOPHAGE-XR) 500 MG 24 hr tablet TAKE 1 TABLET BY MOUTH EVERY DAY WITH BREAKFAST 90 tablet 0   olmesartan (BENICAR) 20 MG tablet Take 1 tablet (20 mg total) by mouth daily. 90 tablet 0   ondansetron (ZOFRAN) 4 MG tablet Take 1 tablet (4 mg total) by mouth every 8 (eight) hours as needed for nausea or vomiting. 20 tablet 0   oxyCODONE (OXY IR/ROXICODONE) 5 MG immediate release tablet Take 1 tablet (5 mg total) by mouth every 6 (six) hours as needed for severe pain. 20 tablet 0   oxyCODONE-acetaminophen (PERCOCET) 10-325 MG tablet Take 1 tablet by mouth 3 (three) times daily as needed.     pantoprazole (PROTONIX) 20 MG tablet Take 1 tablet (20 mg total) by mouth daily. 30 tablet 2   polyethylene glycol (MIRALAX / GLYCOLAX) 17 g packet Take by mouth.     senna (SENOKOT) 8.6 MG tablet Take 1 tablet (8.6 mg total) by mouth daily as needed for constipation. 30 tablet 3   sildenafil (VIAGRA) 100 MG tablet Take 1 tablet (100 mg total) by mouth daily as needed for erectile dysfunction. 30 tablet 11   tadalafil (CIALIS) 20 MG tablet Take 1 tablet (20 mg total) by mouth daily as needed for erectile dysfunction (take 45 minutes prior to sexual activity). 30 tablet 6   traZODone (DESYREL) 50 MG tablet Take 0.5-1 tablets (25-50 mg total) by mouth at bedtime as needed for sleep. 30 tablet 3   No current facility-administered medications for this visit.    REVIEW OF SYSTEMS:   Constitutional: ( - ) fevers, ( - )  chills , ( - ) night sweats Eyes: ( - ) blurriness of vision, ( - ) double vision, ( - ) watery eyes Ears, nose, mouth, throat, and face: ( - ) mucositis, ( - ) sore throat Respiratory: ( - ) cough, ( - )  dyspnea, ( - ) wheezes Cardiovascular: ( - ) palpitation, ( - ) chest discomfort, ( - ) lower extremity  swelling Gastrointestinal:  ( - ) nausea, ( - ) heartburn, ( - ) change in bowel habits Skin: ( - ) abnormal skin rashes Lymphatics: ( - ) new lymphadenopathy, ( - ) easy bruising Neurological: ( - ) numbness, ( - ) tingling, ( - ) new weaknesses Behavioral/Psych: ( - ) mood change, ( - ) new changes  All other systems were reviewed with the patient and are negative.  PHYSICAL EXAMINATION: ECOG PERFORMANCE STATUS: 1 - Symptomatic but completely ambulatory  Vitals:   09/09/23 0920  BP: 136/64  Pulse: 79  Resp: 16  Temp: 98.1 F (36.7 C)  SpO2: 97%    Filed Weights   09/09/23 0920  Weight: 257 lb 3.2 oz (116.7 kg)     GENERAL: well appearing male in NAD  SKIN: skin color, texture, turgor are normal, no rashes or significant lesions EYES: conjunctiva are pink and non-injected, sclera clear LYMPH:  no palpable lymphadenopathy in the cervical or supraclavicular lymph nodes.  LUNGS: clear to auscultation and percussion with normal breathing effort HEART: regular rate & rhythm and no murmurs and no lower extremity edema Musculoskeletal: no cyanosis of digits and no clubbing  PSYCH: alert & oriented x 3, fluent speech NEURO: no focal motor/sensory deficits  LABORATORY DATA:  I have reviewed the data as listed    Latest Ref Rng & Units 09/09/2023    8:37 AM 08/18/2023   10:10 AM 03/11/2023    8:06 AM  CBC  WBC 4.0 - 10.5 K/uL 7.0  7.6  8.7   Hemoglobin 13.0 - 17.0 g/dL 16.1  09.6  04.5   Hematocrit 39.0 - 52.0 % 31.4  36.2  31.1   Platelets 150 - 400 K/uL 357  356  284        Latest Ref Rng & Units 09/09/2023    8:37 AM 08/18/2023   10:10 AM 03/11/2023    8:06 AM  CMP  Glucose 70 - 99 mg/dL 88  409  811   BUN 8 - 23 mg/dL 20  16  23    Creatinine 0.61 - 1.24 mg/dL 9.14  7.82  9.56   Sodium 135 - 145 mmol/L 141  142  144   Potassium 3.5 - 5.1 mmol/L 3.8  4.9  4.3   Chloride 98 - 111 mmol/L 109  106  111   CO2 22 - 32 mmol/L 23  22  24    Calcium 8.9 - 10.3 mg/dL 9.4  21.3   9.6   Total Protein 6.5 - 8.1 g/dL 6.9  6.6  6.7   Total Bilirubin 0.0 - 1.2 mg/dL 0.4  0.5  0.3   Alkaline Phos 38 - 126 U/L 76  104  78   AST 15 - 41 U/L 17  15  15    ALT 0 - 44 U/L 7  10  8      ASSESSMENT & PLAN Matthew Flaming Sr. Is a 68 y.o. male who presents to the clinic for a follow up visit for advanced prostate cancer.   # Castration-sensitive advanced prostate cancer with limited pelvic adenopathy  --Started Eligard 45 injections q 6 months on 09/07/2022 --Received curative, definitive radiation to the prostate, seminal vesicles and pelvic lymph nodes from 11/02/2021-12/28/2021. --Patient is not interested in therapy escalation with abiraterone and prednisone or enzalutamide. He prefers to  continue with androgen deprivation therapy alone. --Under  the care of Dr. Richardo Hanks, last seen on 08/10/2022.  PLAN: --Due for another Eligard injection today which is his last dose, completing 2 years post radiation therapy.  --Labs today show white blood cell 7.0, hemoglobin 10.5, MCV 87.0, and platelets of 357.  PSA level pending.  --Proceed with treatment today and return in 6 months for a follow up visit with Dr. Leonides Schanz.  #Lack of Ejaculate -- Patient reports that when he has an orgasm he does not produce any ejaculate. -- If this is persistent after he discontinues Lupron will request advice from urology. -- Concerned that his seminal vesicles may have been altered during radiation therapy resulting in the lack of ejaculate.  # Normocytic Anemia: --Etiology unknown --Labs today show iron sat of 14% with ferritin of 86 --Currently on p.o. iron therapy over-the-counter. No need for IV iron at this time.  --May also be a component of radiation to the pelvic bones causing the suppression of his blood counts --Can consider bone marrow biopsy to further evaluate if counts worsen or fail to improve over time. --Patient denies any bleeding episodes.   #Fatigue: --Multifactorial including low  testosterone levels, anemia and component of inadequate nutrition.  --Encouraged to incorporate a balance diet with exercise daily. Advised to incorporate protein shakes with his diet.   #Nausea: --Advised to start prescribed zofran 4 mg q 8 hours PRN   No orders of the defined types were placed in this encounter.   All questions were answered. The patient knows to call the clinic with any problems, questions or concerns.  I have spent a total of 30 minutes minutes of face-to-face and non-face-to-face time, preparing to see the patient,  performing a medically appropriate examination, counseling and educating the patient, ordering tests/procedures,  documenting clinical information in the electronic health record,and care coordination.   Georga Kaufmann PA-C Dept of Hematology and Oncology [facility] Phone: [phone]

## 2023-09-10 LAB — PROSTATE-SPECIFIC AG, SERUM (LABCORP): Prostate Specific Ag, Serum: <0.1 ng/mL (ref 0.0–4.0)

## 2023-09-10 LAB — TESTOSTERONE: Testosterone: 13 ng/dL — ABNORMAL LOW (ref 264–916)

## 2023-09-13 ENCOUNTER — Encounter: Payer: Self-pay | Admitting: Physician Assistant

## 2023-09-21 ENCOUNTER — Encounter: Payer: Self-pay | Admitting: Hematology and Oncology

## 2023-09-28 ENCOUNTER — Ambulatory Visit: Payer: Medicare Other | Admitting: Physician Assistant

## 2023-10-08 ENCOUNTER — Other Ambulatory Visit: Payer: Self-pay | Admitting: Internal Medicine

## 2023-10-12 ENCOUNTER — Other Ambulatory Visit: Payer: Self-pay | Admitting: Physician Assistant

## 2023-10-12 DIAGNOSIS — G4709 Other insomnia: Secondary | ICD-10-CM

## 2023-10-18 ENCOUNTER — Other Ambulatory Visit: Payer: Self-pay | Admitting: Internal Medicine

## 2023-10-18 ENCOUNTER — Other Ambulatory Visit: Payer: Self-pay | Admitting: Physician Assistant

## 2023-10-18 DIAGNOSIS — E1159 Type 2 diabetes mellitus with other circulatory complications: Secondary | ICD-10-CM

## 2023-10-20 ENCOUNTER — Encounter: Payer: Self-pay | Admitting: Hematology and Oncology

## 2023-11-09 ENCOUNTER — Other Ambulatory Visit: Payer: Self-pay | Admitting: Internal Medicine

## 2023-11-09 ENCOUNTER — Other Ambulatory Visit: Payer: Self-pay | Admitting: Hematology and Oncology

## 2023-11-09 ENCOUNTER — Other Ambulatory Visit: Payer: Self-pay | Admitting: Physician Assistant

## 2023-11-09 DIAGNOSIS — R232 Flushing: Secondary | ICD-10-CM

## 2023-11-09 DIAGNOSIS — E1142 Type 2 diabetes mellitus with diabetic polyneuropathy: Secondary | ICD-10-CM

## 2023-11-15 ENCOUNTER — Encounter: Payer: Self-pay | Admitting: Internal Medicine

## 2023-11-15 ENCOUNTER — Ambulatory Visit: Payer: Medicare Other | Attending: Internal Medicine | Admitting: Internal Medicine

## 2023-11-15 VITALS — BP 126/75 | HR 76 | Ht 71.0 in | Wt 250.0 lb

## 2023-11-15 DIAGNOSIS — E1142 Type 2 diabetes mellitus with diabetic polyneuropathy: Secondary | ICD-10-CM | POA: Diagnosis not present

## 2023-11-15 DIAGNOSIS — R300 Dysuria: Secondary | ICD-10-CM

## 2023-11-15 DIAGNOSIS — R252 Cramp and spasm: Secondary | ICD-10-CM

## 2023-11-15 DIAGNOSIS — D649 Anemia, unspecified: Secondary | ICD-10-CM

## 2023-11-15 DIAGNOSIS — E1159 Type 2 diabetes mellitus with other circulatory complications: Secondary | ICD-10-CM

## 2023-11-15 DIAGNOSIS — I10 Essential (primary) hypertension: Secondary | ICD-10-CM

## 2023-11-15 DIAGNOSIS — E785 Hyperlipidemia, unspecified: Secondary | ICD-10-CM

## 2023-11-15 DIAGNOSIS — R232 Flushing: Secondary | ICD-10-CM

## 2023-11-15 DIAGNOSIS — Z7984 Long term (current) use of oral hypoglycemic drugs: Secondary | ICD-10-CM | POA: Diagnosis not present

## 2023-11-15 DIAGNOSIS — R200 Anesthesia of skin: Secondary | ICD-10-CM

## 2023-11-15 DIAGNOSIS — G4761 Periodic limb movement disorder: Secondary | ICD-10-CM

## 2023-11-15 LAB — POCT GLYCOSYLATED HEMOGLOBIN (HGB A1C): HbA1c, POC (controlled diabetic range): 5.6 % (ref 0.0–7.0)

## 2023-11-15 LAB — GLUCOSE, POCT (MANUAL RESULT ENTRY): POC Glucose: 113 mg/dL — AB (ref 70–99)

## 2023-11-15 MED ORDER — ATORVASTATIN CALCIUM 40 MG PO TABS: 40.0000 mg | ORAL_TABLET | Freq: Every day | ORAL | 1 refills | Status: DC

## 2023-11-15 MED ORDER — CIPROFLOXACIN HCL 500 MG PO TABS: 500.0000 mg | ORAL_TABLET | Freq: Two times a day (BID) | ORAL | 0 refills | Status: AC

## 2023-11-15 MED ORDER — METFORMIN HCL ER 500 MG PO TB24: ORAL_TABLET | ORAL | 1 refills | Status: DC

## 2023-11-15 MED ORDER — GABAPENTIN 600 MG PO TABS: 600.0000 mg | ORAL_TABLET | Freq: Three times a day (TID) | ORAL | 1 refills | Status: AC

## 2023-11-15 NOTE — Patient Instructions (Signed)
 The atorvastatin  which is your cholesterol medication may be causing muscle cramps.  I recommend cutting the dose in half from 80 mg daily to 40 mg daily.  I have sent an updated prescription to your pharmacy for the 40 mg tablet.  I have referred you to the neurologist for the periodic jerks of your limbs and for the intermittent numbness and tingling in the forearms and hands.  I have sent a prescription to your pharmacy for an antibiotic called ciprofloxacin  for the burning that you are having with urination.  If the burning does not resolve with the antibiotics.  You should follow-up with us  or with your urologist.

## 2023-11-15 NOTE — Progress Notes (Unsigned)
 Patient ID: Matthew Chilcoat Sr., male    DOB: [birth year]  MRN: [id number]  CC: chronic ds management   Subjective: Matthew Colon is a 68 y.o. male who presents for chronic ds management. His concerns today include:  Patient with history of HTN, DM peripheral neuropathy, HL, GERD, OA BL knees and LT shoulder, elevated PSA, ED, BPH, vitamin D  deficiency,former smoker   Discussed the use of AI scribe software for clinical note transcription with the patient, who gave verbal consent to proceed.  History of Present Illness     Patient Active Problem List   Diagnosis Date Noted   Prostate cancer (HCC) 02/25/2022   Drug-induced constipation 07/21/2021   Malignant neoplasm of prostate (HCC) 07/21/2021   Hyperlipidemia associated with type 2 diabetes mellitus (HCC) 11/13/2020   Morbid obesity (HCC) 11/13/2020   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   DDD (degenerative disc disease), cervical 09/21/2017   DJD of acromioclavicular joint (Right) 09/21/2017   DJD of acromioclavicular joint (Bilateral) 09/21/2017   DJD of acromioclavicular joint (Left) 09/21/2017   Osteoarthritis of glenohumeral joint (Left) 09/21/2017   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/2017   Tricompartmental disease of knee (Bilateral) 09/21/2017   Osteoarthritis of knees (Bilateral) 09/21/2017   Suprapatellar effusion of knee (recurrent) (Right) 09/21/2017   Calcaneal spur of feet (Bilateral) 09/21/2017   Hypomagnesemia 09/20/2017   Low testosterone  09/20/2017   Chronic foot pain (Bilateral) 09/20/2017   Vitamin D  deficiency 04/04/2017   Chronic shoulder pain (Primary Area of Pain) (Left) 03/30/2017   Chronic knee pain (Tertiary Area of Pain) (Left) 03/30/2017   Disorder of bone, unspecified 03/30/2017   Other reduced mobility 03/30/2017   Other specified health status 03/30/2017   Opioid use agreement exists 03/30/2017   Opiate use 03/30/2017   Chronic pain syndrome 03/30/2017   Chronic upper  extremity pain (Secondary Area of Pain) (Bilateral) 03/30/2017   Elevated PSA 12/05/2015   Noncompliance 12/05/2015   Hypertension associated with diabetes (HCC) 03/14/2015   Type 2 diabetes mellitus with diabetic mononeuropathy (HCC) 03/14/2015   Encounter for health maintenance examination in adult 03/14/2015   Screening for prostate cancer 03/14/2015   Special screening for malignant neoplasms, colon 03/14/2015   Hyperlipidemia 03/14/2015   Nocturia 03/14/2015   Urinary frequency 03/14/2015   Need for prophylactic vaccination and inoculation against influenza 03/14/2015   Need for prophylactic vaccination against Streptococcus pneumoniae (pneumococcus) 03/14/2015   Need for Tdap vaccination 03/14/2015   Chronic nausea 03/14/2015   Gastroesophageal reflux disease without esophagitis 03/14/2015     Current Outpatient Medications on File Prior to Visit  Medication Sig Dispense Refill   alfuzosin  (UROXATRAL ) 10 MG 24 hr tablet Take 1 tablet (10 mg total) by mouth daily with breakfast. 30 tablet 11   atorvastatin  (LIPITOR) 80 MG tablet Take 1 tablet (80 mg total) by mouth daily. 90 tablet 0   cholecalciferol (VITAMIN D3) 25 MCG (1000 UNIT) tablet Take 1,000 Units by mouth daily.     ferrous sulfate  325 (65 FE) MG EC tablet Take 1 tablet (325 mg total) by mouth daily with breakfast. 30 tablet 6   fluticasone  (FLONASE ) 50 MCG/ACT nasal spray SPRAY 2 SPRAYS INTO EACH NOSTRIL EVERY DAY 16 mL 0   folic acid  (FOLVITE ) 1 MG tablet TAKE 1 TABLET BY MOUTH EVERY DAY 90 tablet 2   gabapentin  (NEURONTIN ) 600 MG tablet Take 1 tablet (600 mg total) by mouth 3 (three) times daily. Needs to keep appointment 11/15/2023 for additional refills. 90 tablet 0  meloxicam  (MOBIC ) 7.5 MG tablet TAKE 1 TABLET (7.5 MG TOTAL) BY MOUTH DAILY. PLEASE MAKE AN APPT 90 tablet 0   metFORMIN  (GLUCOPHAGE -XR) 500 MG 24 hr tablet TAKE 1 TABLET BY MOUTH EVERY DAY WITH BREAKFAST 90 tablet 0   olmesartan  (BENICAR ) 20 MG tablet  TAKE 1 TABLET BY MOUTH EVERY DAY 30 tablet 0   oxyCODONE  (OXY IR/ROXICODONE ) 5 MG immediate release tablet Take 1 tablet (5 mg total) by mouth every 6 (six) hours as needed for severe pain. 20 tablet 0   pantoprazole  (PROTONIX ) 20 MG tablet Take 1 tablet (20 mg total) by mouth daily. 30 tablet 2   polyethylene glycol (MIRALAX / GLYCOLAX) 17 g packet Take by mouth.     senna (SENOKOT) 8.6 MG tablet Take 1 tablet (8.6 mg total) by mouth daily as needed for constipation. 30 tablet 3   tadalafil  (CIALIS ) 20 MG tablet Take 1 tablet (20 mg total) by mouth daily as needed for erectile dysfunction (take 45 minutes prior to sexual activity). 30 tablet 6   traZODone  (DESYREL ) 50 MG tablet TAKE 0.5-1 TABLETS BY MOUTH AT BEDTIME AS NEEDED FOR SLEEP. 90 tablet 0   calcium  carbonate (OS-CAL - DOSED IN MG OF ELEMENTAL CALCIUM ) 1250 (500 Ca) MG tablet Take 1 tablet by mouth daily with breakfast. (Patient not taking: Reported on 11/15/2023)     cetirizine  (ZYRTEC  ALLERGY) 10 MG tablet Take 1 tablet (10 mg total) by mouth daily. (Patient not taking: Reported on 11/15/2023) 100 tablet 1   ondansetron  (ZOFRAN ) 4 MG tablet Take 1 tablet (4 mg total) by mouth every 8 (eight) hours as needed for nausea or vomiting. (Patient not taking: Reported on 11/15/2023) 20 tablet 0   oxyCODONE -acetaminophen  (PERCOCET) 10-325 MG tablet Take 1 tablet by mouth 3 (three) times daily as needed. (Patient not taking: Reported on 11/15/2023)     sildenafil  (VIAGRA ) 100 MG tablet Take 1 tablet (100 mg total) by mouth daily as needed for erectile dysfunction. (Patient not taking: Reported on 11/15/2023) 30 tablet 11   No current facility-administered medications on file prior to visit.    No Known Allergies  Social History   Socioeconomic History   Marital status: Divorced    Spouse name: Not on file   Number of children: 3   Years of education: Not on file   Highest education level: Not on file  Occupational History   Occupation:  switcher/ spotter/forklift  Tobacco Use   Smoking status: Former    Types: Cigarettes    Passive exposure: Current (1 1/2 mnth)   Smokeless tobacco: Never  Vaping Use   Vaping status: Never Used  Substance and Sexual Activity   Alcohol use: Not Currently    Comment: rare beer   Drug use: No   Sexual activity: Yes  Other Topics Concern   Not on file  Social History Narrative   Lives with his sister.   Works as a Engineer, civil (consulting) in [employer], water quality.   Exercise - active on the job.  Has 3 children in [place] .   Social Drivers of Health   Financial Resource Strain: Medium Risk (08/19/2023)   Overall Financial Resource Strain (CARDIA)    Difficulty of Paying Living Expenses: Somewhat hard  Food Insecurity: No Food Insecurity (08/19/2023)   Hunger Vital Sign    Worried About Running Out of Food in the Last Year: Never true    Ran Out of Food in the Last Year: Never true  Transportation Needs: No Transportation Needs (08/19/2023)  PRAPARE - Administrator, Civil Service (Medical): No    Lack of Transportation (Non-Medical): No  Physical Activity: Insufficiently Active (08/19/2023)   Exercise Vital Sign    Days of Exercise per Week: 2 days    Minutes of Exercise per Session: 30 min  Stress: Stress Concern Present (08/19/2023)   [employer] of Occupational Health - Occupational Stress Questionnaire    Feeling of Stress : Rather much  Social Connections: Socially Isolated (08/19/2023)   Social Connection and Isolation Panel [NHANES]    Frequency of Communication with Friends and Family: Once a week    Frequency of Social Gatherings with Friends and Family: Never    Attends Religious Services: Never    Database administrator or Organizations: No    Attends Banker Meetings: Never    Marital Status: Divorced  Catering manager Violence: Not At Risk (05/17/2023)   Humiliation, Afraid, Rape, and Kick questionnaire    Fear of Current or Ex-Partner: No    Emotionally  Abused: No    Physically Abused: No    Sexually Abused: No    Family History  Problem Relation Age of Onset   Diabetes Mother    Hypertension Mother    Other Father        murdered   Diabetes Sister    Arthritis Sister    Heart disease Sister    Diabetes Sister    Diabetes Sister    Cancer Neg Hx    Stroke Neg Hx    Prostate cancer Neg Hx        unknown   Bladder Cancer Neg Hx        unkown   Kidney cancer Neg Hx        unkown    Past Surgical History:  Procedure Laterality Date   COLONOSCOPY     referral pending 03/2015   FISTULOTOMY N/A 02/11/2023   Procedure: INTERROGATION OF ANAL FISTULA WITH FISTULOTOMY;  Surgeon: Joyce Nixon, MD;  Location: [facility];  Service: General;  Laterality: N/A;   NO PAST SURGERIES     as of 03/2015    ROS: Review of Systems Negative except as stated above  PHYSICAL EXAM: BP 126/75 (BP Location: Left Arm, Patient Position: Sitting, Cuff Size: Normal)   Pulse 76   Ht 5\' 11"  (1.803 m)   Wt 250 lb (113.4 kg)   SpO2 99%   BMI 34.87 kg/m   Physical Exam   {male adult master:[id number]} Diabetic Foot Exam - Simple   Simple Foot Form Diabetic Foot exam was performed with the following findings: Yes 11/15/2023 10:04 AM  Visual Inspection See comments: Yes Sensation Testing Intact to touch and monofilament testing bilaterally: Yes Pulse Check Posterior Tibialis and Dorsalis pulse intact bilaterally: Yes Comments Toenails are discolored.  He has noninflamed bunions of both big toes right being larger than left.        Latest Ref Rng & Units 09/09/2023    8:37 AM 08/18/2023   10:10 AM 03/11/2023    8:06 AM  CMP  Glucose 70 - 99 mg/dL 88  027  253   BUN 8 - 23 mg/dL 20  16  23    Creatinine 0.61 - 1.24 mg/dL 6.64  4.03  4.74   Sodium 135 - 145 mmol/L 141  142  144   Potassium 3.5 - 5.1 mmol/L 3.8  4.9  4.3   Chloride 98 - 111 mmol/L 109  106  111  CO2 22 - 32 mmol/L 23  22  24    Calcium  8.9 - 10.3 mg/dL 9.4  21.3   9.6   Total Protein 6.5 - 8.1 g/dL 6.9  6.6  6.7   Total Bilirubin 0.0 - 1.2 mg/dL 0.4  0.5  0.3   Alkaline Phos 38 - 126 U/L 76  104  78   AST 15 - 41 U/L 17  15  15    ALT 0 - 44 U/L 7  10  8     Lipid Panel     Component Value Date/Time   CHOL 195 04/06/2022 0840   TRIG 113 04/06/2022 0840   HDL 81 04/06/2022 0840   CHOLHDL 2.4 04/06/2022 0840   CHOLHDL 2.8 03/14/2015 0001   VLDL 17 03/14/2015 0001   LDLCALC 94 04/06/2022 0840    CBC    Component Value Date/Time   WBC 7.0 09/09/2023 0837   WBC 10.9 (H) 05/06/2018 0805   RBC 3.61 (L) 09/09/2023 0837   HGB 10.5 (L) 09/09/2023 0837   HGB 11.6 (L) 08/18/2023 1010   HCT 31.4 (L) 09/09/2023 0837   HCT 36.2 (L) 08/18/2023 1010   PLT 357 09/09/2023 0837   PLT 356 08/18/2023 1010   MCV 87.0 09/09/2023 0837   MCV 91 08/18/2023 1010   MCH 29.1 09/09/2023 0837   MCHC 33.4 09/09/2023 0837   RDW 14.9 09/09/2023 0837   RDW 13.2 08/18/2023 1010   LYMPHSABS 1.2 09/09/2023 0837   LYMPHSABS 1.2 08/18/2023 1010   MONOABS 0.5 09/09/2023 0837   EOSABS 0.3 09/09/2023 0837   EOSABS 0.3 08/18/2023 1010   BASOSABS 0.0 09/09/2023 0837   BASOSABS 0.0 08/18/2023 1010    ASSESSMENT AND PLAN:  Assessment and Plan Assessment & Plan      There are no diagnoses linked to this encounter.   Patient was given the opportunity to ask questions.  Patient verbalized understanding of the plan and was able to repeat key elements of the plan.   This documentation was completed using Paediatric nurse.  Any transcriptional errors are unintentional.  No orders of the defined types were placed in this encounter.    Requested Prescriptions    No prescriptions requested or ordered in this encounter    No follow-ups on file.  Concetta Dee, MD, FACP

## 2023-11-16 ENCOUNTER — Encounter: Payer: Self-pay | Admitting: Internal Medicine

## 2023-11-16 ENCOUNTER — Ambulatory Visit: Payer: Self-pay | Admitting: Internal Medicine

## 2023-11-16 LAB — LIPID PANEL
Chol/HDL Ratio: 2.3 ratio (ref 0.0–5.0)
Cholesterol, Total: 199 mg/dL (ref 100–199)
HDL: 87 mg/dL (ref >39)
LDL Chol Calc (NIH): 104 mg/dL — ABNORMAL HIGH (ref 0–99)
Triglycerides: 40 mg/dL (ref 0–149)
VLDL Cholesterol Cal: 8 mg/dL (ref 5–40)

## 2023-11-16 LAB — CK: Total CK: 106 U/L (ref 41–331)

## 2023-12-07 ENCOUNTER — Telehealth: Payer: Self-pay

## 2023-12-07 NOTE — Telephone Encounter (Signed)
 Patient called concerned about urinary urgency but not actually urinating when trying.  Appointment scheduled.

## 2023-12-09 ENCOUNTER — Encounter: Payer: Self-pay | Admitting: Hematology and Oncology

## 2023-12-09 ENCOUNTER — Encounter: Payer: Self-pay | Admitting: Urology

## 2023-12-09 ENCOUNTER — Ambulatory Visit (INDEPENDENT_AMBULATORY_CARE_PROVIDER_SITE_OTHER): Admitting: Urology

## 2023-12-09 VITALS — BP 118/76 | HR 84

## 2023-12-09 DIAGNOSIS — C61 Malignant neoplasm of prostate: Secondary | ICD-10-CM | POA: Diagnosis not present

## 2023-12-09 DIAGNOSIS — R399 Unspecified symptoms and signs involving the genitourinary system: Secondary | ICD-10-CM

## 2023-12-09 LAB — URINALYSIS, COMPLETE
Bilirubin, UA: NEGATIVE
Glucose, UA: NEGATIVE
Ketones, UA: NEGATIVE
Leukocytes,UA: NEGATIVE
Nitrite, UA: NEGATIVE
Protein,UA: NEGATIVE
RBC, UA: NEGATIVE
Specific Gravity, UA: 1.01 (ref 1.005–1.030)
Urobilinogen, Ur: 0.2 mg/dL (ref 0.2–1.0)
pH, UA: 6.5 (ref 5.0–7.5)

## 2023-12-09 LAB — BLADDER SCAN AMB NON-IMAGING: Scan Result: 234

## 2023-12-09 LAB — MICROSCOPIC EXAMINATION

## 2023-12-09 MED ORDER — CEFUROXIME AXETIL 250 MG PO TABS: 250.0000 mg | ORAL_TABLET | Freq: Two times a day (BID) | ORAL | 0 refills | Status: AC

## 2023-12-09 NOTE — Progress Notes (Signed)
 12/09/2023 11:29 AM   Matthew Sans Sr. 17-Oct-1955 [id number]  Referring provider: Lawrance Presume, MD [address] [place],  [place] [address]  Urological history: 1.  Prostate cancer - PSA (09/2023) <0.1 - castrate sensitive high grade metastatic disease - radiation (11/2021) - completed 2 year of ADT (completed 08/2023)  2. LU TS  -tamsulosin  0.8 mg daily  3. ED - sildenafil  100 mg, on-demand-dosing  Chief Complaint  Patient presents with   Urinary Frequency   HPI: Matthew Quam Sr. is a 68 y.o. man who presents today for urinary urgency.  Previous records reviewed.   He is having to get up and down to urinate all night long and during the day he is making trips to the restroom to void 6 to 7 times.  He recently increased his tamsulosin  0.4 mg to twice daily and saw some mild improvement, but it has seemed to taper off.  When he feels the urge to void, he experiences a burning sensation and the urine is just trickling out.  This has been occurring for over one month.    Patient denies any modifying or aggravating factors.  Patient denies any recent UTI's, gross hematuria or suprapubic/flank pain.  Patient denies any fevers, chills, nausea or vomiting.    PVR 234 mL   UA yellow clear, specific gravity 1.010, pH 6.5, 0-5 WBC's, 0-2 RBC's, 0-10 epithelial cells and a few bacteria.    PMH: Past Medical History:  Diagnosis Date   Anal fistula    Arthritis    Foot pain, bilateral    referred to podiatry 06/2014   GERD (gastroesophageal reflux disease)    long history of   Hyperlipidemia    Hypertension    Polyarthralgia    shoulders, knees, ankles, wrists; neg rheum lab screen 04/2014   Prostate cancer (HCC)    Type 2 diabetes mellitus with diabetic neuropathy (HCC)    Wears glasses    Wears partial dentures upper     Surgical History: Past Surgical History:  Procedure Laterality Date   COLONOSCOPY     referral pending 03/2015    FISTULOTOMY N/A 02/11/2023   Procedure: INTERROGATION OF ANAL FISTULA WITH FISTULOTOMY;  Surgeon: Joyce Nixon, MD;  Location: [facility];  Service: General;  Laterality: N/A;   NO PAST SURGERIES     as of 03/2015    Home Medications:  Allergies as of 12/09/2023   No Known Allergies      Medication List        Accurate as of December 09, 2023 11:59 PM. If you have any questions, ask your nurse or doctor.          alfuzosin  10 MG 24 hr tablet Commonly known as: UROXATRAL  Take 1 tablet (10 mg total) by mouth daily with breakfast.   atorvastatin  40 MG tablet Commonly known as: LIPITOR Take 1 tablet (40 mg total) by mouth daily.   calcium  carbonate 1250 (500 Ca) MG tablet Commonly known as: OS-CAL - dosed in mg of elemental calcium  Take 1 tablet by mouth daily with breakfast.   cefUROXime  250 MG tablet Commonly known as: CEFTIN  Take 1 tablet (250 mg total) by mouth 2 (two) times daily with a meal for 7 days. Started by: Matilde Son   cholecalciferol 25 MCG (1000 UNIT) tablet Commonly known as: VITAMIN D3 Take 1,000 Units by mouth daily.   ferrous sulfate  325 (65 FE) MG EC tablet Take 1 tablet (325 mg total)  by mouth daily with breakfast.   fluticasone  50 MCG/ACT nasal spray Commonly known as: FLONASE  SPRAY 2 SPRAYS INTO EACH NOSTRIL EVERY DAY   folic acid  1 MG tablet Commonly known as: FOLVITE  TAKE 1 TABLET BY MOUTH EVERY DAY   gabapentin  600 MG tablet Commonly known as: NEURONTIN  Take 1 tablet (600 mg total) by mouth 3 (three) times daily. Needs to keep appointment 11/15/2023 for additional refills.   meloxicam  7.5 MG tablet Commonly known as: MOBIC  TAKE 1 TABLET (7.5 MG TOTAL) BY MOUTH DAILY. PLEASE MAKE AN APPT   metFORMIN  500 MG 24 hr tablet Commonly known as: GLUCOPHAGE -XR TAKE 1 TABLET BY MOUTH EVERY DAY WITH BREAKFAST   olmesartan  20 MG tablet Commonly known as: BENICAR  TAKE 1 TABLET BY MOUTH EVERY DAY   ondansetron  4 MG  tablet Commonly known as: Zofran  Take 1 tablet (4 mg total) by mouth every 8 (eight) hours as needed for nausea or vomiting.   oxyCODONE  5 MG immediate release tablet Commonly known as: Oxy IR/ROXICODONE  Take 1 tablet (5 mg total) by mouth every 6 (six) hours as needed for severe pain.   pantoprazole  20 MG tablet Commonly known as: PROTONIX  Take 1 tablet (20 mg total) by mouth daily.   polyethylene glycol 17 g packet Commonly known as: MIRALAX / GLYCOLAX Take by mouth.   senna 8.6 MG tablet Commonly known as: SENOKOT Take 1 tablet (8.6 mg total) by mouth daily as needed for constipation.   sildenafil  100 MG tablet Commonly known as: Viagra  Take 1 tablet (100 mg total) by mouth daily as needed for erectile dysfunction.   tadalafil  20 MG tablet Commonly known as: CIALIS  Take 1 tablet (20 mg total) by mouth daily as needed for erectile dysfunction (take 45 minutes prior to sexual activity).   traZODone  50 MG tablet Commonly known as: DESYREL  TAKE 0.5-1 TABLETS BY MOUTH AT BEDTIME AS NEEDED FOR SLEEP.        Allergies: No Known Allergies  Family History: Family History  Problem Relation Age of Onset   Diabetes Mother    Hypertension Mother    Other Father        murdered   Diabetes Sister    Arthritis Sister    Heart disease Sister    Diabetes Sister    Diabetes Sister    Cancer Neg Hx    Stroke Neg Hx    Prostate cancer Neg Hx        unknown   Bladder Cancer Neg Hx        unkown   Kidney cancer Neg Hx        unkown    Social History:  reports that he has quit smoking. His smoking use included cigarettes. He has been exposed to tobacco smoke. He has never used smokeless tobacco. He reports that he does not currently use alcohol. He reports that he does not use drugs.  ROS: Pertinent ROS in HPI  Physical Exam: BP 118/76   Pulse 84   Constitutional:  Well nourished. Alert and oriented, No acute distress. HEENT:  AT, moist mucus membranes.  Trachea  midline Cardiovascular: No clubbing, cyanosis, or edema. Respiratory: Normal respiratory effort, no increased work of breathing. Neurologic: Grossly intact, no focal deficits, moving all 4 extremities. Psychiatric: Normal mood and affect.  Laboratory Data: Lab Results  Component Value Date   WBC 7.0 09/09/2023   HGB 10.5 (L) 09/09/2023   HCT 31.4 (L) 09/09/2023   MCV 87.0 09/09/2023   PLT 357 09/09/2023  Lab Results  Component Value Date   CREATININE 1.29 (H) 09/09/2023    Lab Results  Component Value Date   TESTOSTERONE  13 (L) 09/09/2023    Lab Results  Component Value Date   HGBA1C 5.6 11/15/2023    Lab Results  Component Value Date   TSH 1.540 08/18/2023       Component Value Date/Time   CHOL 199 11/15/2023 1012   HDL 87 11/15/2023 1012   CHOLHDL 2.3 11/15/2023 1012   CHOLHDL 2.8 03/14/2015 0001   VLDL 17 03/14/2015 0001   LDLCALC 104 (H) 11/15/2023 1012    Lab Results  Component Value Date   AST 17 09/09/2023   Lab Results  Component Value Date   ALT 7 09/09/2023    Urinalysis See EPIC and HPI  I have reviewed the labs.   Pertinent Imaging:  12/09/23 08:39  Scan Result 234 ml   Assessment & Plan:    1. Lower urinary tract symptoms (LUTS) (Primary) - Bladder Scan (Post Void Residual) in office, demonstrates sub optimal bladder emptying  - CULTURE, URINE COMPREHENSIVE to rule out an indolent infection that may be contributing to his issues, but not likely, started on Ceftin  empirically as the weekend is approaching and he will have limited access to office if symptoms change  - Urinalysis, Complete, unremarkable  - continue tamsulosin  0.4 mg twice daily - discussed that he may be having issues with BPH, bladder neck contracture or urethral strictures contributing to his symptoms and that a cystoscopy can help evaluate for this - Explained that the cystoscopy is a safe and common diagnostic test performed by one of our physicians in the  office.  It consist of using a thin, lighted tube to look directly inside the bladder, prostate and urethra to evaluate the anatomy.  The procedure is brief, typically taking about 5 minutes. -This will enable us  to assess bladder health, diagnose and enlarged prostate, assess which BPH procedure may be most appropriate and rule out other bladder conditions (stricture disease, stones, cancer, etc.)  -Advised the patient that there are no restrictions to eating or drinking prior to the cystoscopy -They can continue to take all of their medications as prescribed -They can drive themselves to and from the appointment -I explained that during the procedure, the area around the urethra will be cleansed thoroughly, topical anesthetic will be applied to numb your urethra, the thin tube is then gently inserted through the urethra into your bladder while fluid flows through the tube to the bladder to enable better visualization -I explained the procedure is usually not painful, however there may be some discomfort (pinching feeling), and they may feel an urge to urinate, coolness or fullness in the bladder and then the cystoscope is removed -After the cystoscopy, I advised them that they may experience urinary frequency, hematuria, dysuria which will resolve within 24 to 48 hours -Reviewed red flag signs (fever, bright red blood or blood clots in the urine, abdominal pain or difficulty urinating) and to contact the office immediately or seek treatment in the ED if they should experience any of these -The physician will discuss the results of the cystoscopy at the time of the procedure  - he is in agreement and wishes to proceed with cystoscopy   2. Prostate cancer - PSA undetectable -follow by Dr. Rosaline Coma in [place]   Return for cysto w/ Dr. Estanislao Heimlich for LUTS .  These notes generated with voice recognition software. I apologize for typographical errors.  Aedan Geimer  Americo Baker  [facility] [address]  [address] [place], [place] [address] [phone]

## 2023-12-09 NOTE — Patient Instructions (Signed)

## 2023-12-12 LAB — CULTURE, URINE COMPREHENSIVE

## 2023-12-19 LAB — OPHTHALMOLOGY REPORT-SCANNED

## 2023-12-23 ENCOUNTER — Telehealth: Payer: Self-pay | Admitting: Urology

## 2023-12-23 NOTE — Telephone Encounter (Signed)
 Patient called to request refill for Ceftin . He finished medication and is still having some lingering symptoms. I did tell patient he may need to be seen in office again, but he wanted to ask if he could have one refill. He has appt for Cysto with Dr. Estanislao Heimlich on 02/02/24. Pharmacy is [facility] on [address] in [place].

## 2023-12-26 NOTE — Telephone Encounter (Signed)
 Pt states ATB that was given on 6/6 helped with his sx.   He completed ATB about a week ago. He still feels sx are lingering.  Offered to make an appt. To recheck u/a and ucx. But pt declines at this time.   Encouraged pt to call office if sx do not improve. Pt voiced understanding.

## 2023-12-28 ENCOUNTER — Encounter: Payer: Self-pay | Admitting: Physician Assistant

## 2023-12-28 ENCOUNTER — Ambulatory Visit: Admitting: Physician Assistant

## 2023-12-28 VITALS — BP 122/62 | HR 82 | Ht 71.0 in | Wt 245.0 lb

## 2023-12-28 DIAGNOSIS — K6289 Other specified diseases of anus and rectum: Secondary | ICD-10-CM

## 2023-12-28 DIAGNOSIS — K640 First degree hemorrhoids: Secondary | ICD-10-CM

## 2023-12-28 DIAGNOSIS — K5903 Drug induced constipation: Secondary | ICD-10-CM

## 2023-12-28 DIAGNOSIS — K5900 Constipation, unspecified: Secondary | ICD-10-CM

## 2023-12-28 DIAGNOSIS — K627 Radiation proctitis: Secondary | ICD-10-CM

## 2023-12-28 MED ORDER — HYDROCORTISONE ACETATE 25 MG RE SUPP: 25.0000 mg | Freq: Two times a day (BID) | RECTAL | 2 refills | Status: AC

## 2023-12-28 MED ORDER — NALOXEGOL OXALATE 25 MG PO TABS: 25.0000 mg | ORAL_TABLET | Freq: Every day | ORAL | 3 refills | Status: AC

## 2023-12-28 NOTE — Progress Notes (Signed)
 Chief Complaint: Constipation and rectal pain  HPI:    [name] is a 68 year old African-American male with a past medical history as listed below including GERD, known to Dr. Shila, who was referred to me by Vicci Barnie NOVAK, MD for a complaint of constipation and rectal pain.      09/30/2021 colonoscopy with moderate diverticulosis in the sigmoid descending and ascending colon, external and internal hemorrhoids and 1 less than 1 mm polyp in the transverse colon shown to be a tubular adenoma and repeat recommended in 7 years.    12/15/2022 fleck sigmoidoscopy    11/08/2022 patient seen in clinic and at that time discussed rectal pain/pressure for the past few months with radiation to prostate.  Also chronic reflux on Pantoprazole  20 daily.    02/11/2023 patient seen by general surgery for interrogation of anal fistula with fistulotomy, followed up on 03/09/2023 and was discharged from their clinic.    Today, patient presents to clinic and explains that he continues with rectal pain.  Tells me this is mostly after he has a bowel movement.  He never sees any bleeding.  He does not feel like his fistula is back, he did have this all taken care of last year but he still has discomfort.  Also describes constipation.  Apparently he is using Oxycodone  quite regularly for this rectal pain.    Denies fever, chills or weight loss.  Past Medical History:  Diagnosis Date   Anal fistula    Arthritis    Foot pain, bilateral    referred to podiatry 06/2014   GERD (gastroesophageal reflux disease)    long history of   Hyperlipidemia    Hypertension    Polyarthralgia    shoulders, knees, ankles, wrists; neg rheum lab screen 04/2014   Prostate cancer (HCC)    Type 2 diabetes mellitus with diabetic neuropathy (HCC)    Wears glasses    Wears partial dentures upper     Past Surgical History:  Procedure Laterality Date   COLONOSCOPY     referral pending 03/2015   FISTULOTOMY N/A 02/11/2023    Procedure: INTERROGATION OF ANAL FISTULA WITH FISTULOTOMY;  Surgeon: Debby Hila, MD;  Location: [facility];  Service: General;  Laterality: N/A;   NO PAST SURGERIES     as of 03/2015    Current Outpatient Medications  Medication Sig Dispense Refill   alfuzosin  (UROXATRAL ) 10 MG 24 hr tablet Take 1 tablet (10 mg total) by mouth daily with breakfast. 30 tablet 11   atorvastatin  (LIPITOR) 40 MG tablet Take 1 tablet (40 mg total) by mouth daily. 90 tablet 1   calcium  carbonate (OS-CAL - DOSED IN MG OF ELEMENTAL CALCIUM ) 1250 (500 Ca) MG tablet Take 1 tablet by mouth daily with breakfast.     cholecalciferol (VITAMIN D3) 25 MCG (1000 UNIT) tablet Take 1,000 Units by mouth daily.     ferrous sulfate  325 (65 FE) MG EC tablet Take 1 tablet (325 mg total) by mouth daily with breakfast. 30 tablet 6   fluticasone  (FLONASE ) 50 MCG/ACT nasal spray SPRAY 2 SPRAYS INTO EACH NOSTRIL EVERY DAY 16 mL 0   folic acid  (FOLVITE ) 1 MG tablet TAKE 1 TABLET BY MOUTH EVERY DAY 90 tablet 2   gabapentin  (NEURONTIN ) 600 MG tablet Take 1 tablet (600 mg total) by mouth 3 (three) times daily. Needs to keep appointment 11/15/2023 for additional refills. 270 tablet 1   meloxicam  (MOBIC ) 7.5 MG tablet TAKE 1 TABLET (7.5  MG TOTAL) BY MOUTH DAILY. PLEASE MAKE AN APPT 90 tablet 0   metFORMIN  (GLUCOPHAGE -XR) 500 MG 24 hr tablet TAKE 1 TABLET BY MOUTH EVERY DAY WITH BREAKFAST 90 tablet 1   olmesartan  (BENICAR ) 20 MG tablet TAKE 1 TABLET BY MOUTH EVERY DAY 30 tablet 0   ondansetron  (ZOFRAN ) 4 MG tablet Take 1 tablet (4 mg total) by mouth every 8 (eight) hours as needed for nausea or vomiting. 20 tablet 0   oxyCODONE  (OXY IR/ROXICODONE ) 5 MG immediate release tablet Take 1 tablet (5 mg total) by mouth every 6 (six) hours as needed for severe pain. 20 tablet 0   pantoprazole  (PROTONIX ) 20 MG tablet Take 1 tablet (20 mg total) by mouth daily. 30 tablet 2   polyethylene glycol (MIRALAX / GLYCOLAX) 17 g packet Take by  mouth.     senna (SENOKOT) 8.6 MG tablet Take 1 tablet (8.6 mg total) by mouth daily as needed for constipation. 30 tablet 3   sildenafil  (VIAGRA ) 100 MG tablet Take 1 tablet (100 mg total) by mouth daily as needed for erectile dysfunction. 30 tablet 11   tadalafil  (CIALIS ) 20 MG tablet Take 1 tablet (20 mg total) by mouth daily as needed for erectile dysfunction (take 45 minutes prior to sexual activity). 30 tablet 6   traZODone  (DESYREL ) 50 MG tablet TAKE 0.5-1 TABLETS BY MOUTH AT BEDTIME AS NEEDED FOR SLEEP. 90 tablet 0   No current facility-administered medications for this visit.    Allergies as of 12/28/2023   (No Known Allergies)    Family History  Problem Relation Age of Onset   Diabetes Mother    Hypertension Mother    Other Father        murdered   Diabetes Sister    Arthritis Sister    Heart disease Sister    Diabetes Sister    Diabetes Sister    Cancer Neg Hx    Stroke Neg Hx    Prostate cancer Neg Hx        unknown   Bladder Cancer Neg Hx        unkown   Kidney cancer Neg Hx        unkown    Social History   Socioeconomic History   Marital status: Divorced    Spouse name: Not on file   Number of children: 3   Years of education: Not on file   Highest education level: Not on file  Occupational History   Occupation: switcher/ spotter/forklift  Tobacco Use   Smoking status: Former    Types: Cigarettes    Passive exposure: Current (1 1/2 mnth)   Smokeless tobacco: Never  Vaping Use   Vaping status: Never Used  Substance and Sexual Activity   Alcohol use: Not Currently    Comment: rare beer   Drug use: No   Sexual activity: Yes  Other Topics Concern   Not on file  Social History Narrative   Lives with his sister.   Works as a Engineer, civil (consulting) in [employer], water quality.   Exercise - active on the job.  Has 3 children in [place] .   Social Drivers of Health   Financial Resource Strain: Medium Risk (08/19/2023)   Overall Financial Resource Strain (CARDIA)     Difficulty of Paying Living Expenses: Somewhat hard  Food Insecurity: No Food Insecurity (08/19/2023)   Hunger Vital Sign    Worried About Running Out of Food in the Last Year: Never true    Ran Out of Food in  the Last Year: Never true  Transportation Needs: No Transportation Needs (08/19/2023)   PRAPARE - Administrator, Civil Service (Medical): No    Lack of Transportation (Non-Medical): No  Physical Activity: Insufficiently Active (08/19/2023)   Exercise Vital Sign    Days of Exercise per Week: 2 days    Minutes of Exercise per Session: 30 min  Stress: Stress Concern Present (08/19/2023)   [employer] of Occupational Health - Occupational Stress Questionnaire    Feeling of Stress : Rather much  Social Connections: Socially Isolated (08/19/2023)   Social Connection and Isolation Panel    Frequency of Communication with Friends and Family: Once a week    Frequency of Social Gatherings with Friends and Family: Never    Attends Religious Services: Never    Database administrator or Organizations: No    Attends Banker Meetings: Never    Marital Status: Divorced  Catering manager Violence: Not At Risk (05/17/2023)   Humiliation, Afraid, Rape, and Kick questionnaire    Fear of Current or Ex-Partner: No    Emotionally Abused: No    Physically Abused: No    Sexually Abused: No    Review of Systems:    Constitutional: No weight loss, fever or chills Skin: No rash Cardiovascular: No chest pain   Respiratory: No SOB  Gastrointestinal: See HPI and otherwise negative Genitourinary: No dysuria  Neurological: No headache, dizziness or syncope Musculoskeletal: No new muscle or joint pain Hematologic: No bruising Psychiatric: No history of depression or anxiety   Physical Exam:  Vital signs: BP 122/62   Pulse 82   Ht 5' 11 (1.803 m)   Wt 245 lb (111.1 kg)   BMI 34.17 kg/m    Constitutional:   Pleasant AA male appears to be in NAD, Well developed, Well  nourished, alert and cooperative Head:  Normocephalic and atraumatic. Eyes:   PEERL, EOMI. No icterus. Conjunctiva pink. Ears:  Normal auditory acuity. Neck:  Supple Throat: Oral cavity and pharynx without inflammation, swelling or lesion.  Respiratory: Respirations even and unlabored. Lungs clear to auscultation bilaterally.   No wheezes, crackles, or rhonchi.  Cardiovascular: Normal S1, S2. No MRG. Regular rate and rhythm. No peripheral edema, cyanosis or pallor.  Gastrointestinal:  Soft, nondistended, nontender. No rebound or guarding. Normal bowel sounds. No appreciable masses or hepatomegaly. Rectal: External: No abnormality; internal: No mass ; anoscopy: Grade 1 hemorrhoids, mild discomfort, no fissure Msk:  Symmetrical without gross deformities. Without edema, no deformity or joint abnormality.  Neurologic:  Alert and  oriented x4;  grossly normal neurologically.  Skin:   Dry and intact without significant lesions or rashes. Psychiatric: Demonstrates good judgement and reason without abnormal affect or behaviors.  RELEVANT LABS AND IMAGING: CBC    Component Value Date/Time   WBC 7.0 09/09/2023 0837   WBC 10.9 (H) 05/06/2018 0805   RBC 3.61 (L) 09/09/2023 0837   HGB 10.5 (L) 09/09/2023 0837   HGB 11.6 (L) 08/18/2023 1010   HCT 31.4 (L) 09/09/2023 0837   HCT 36.2 (L) 08/18/2023 1010   PLT 357 09/09/2023 0837   PLT 356 08/18/2023 1010   MCV 87.0 09/09/2023 0837   MCV 91 08/18/2023 1010   MCH 29.1 09/09/2023 0837   MCHC 33.4 09/09/2023 0837   RDW 14.9 09/09/2023 0837   RDW 13.2 08/18/2023 1010   LYMPHSABS 1.2 09/09/2023 0837   LYMPHSABS 1.2 08/18/2023 1010   MONOABS 0.5 09/09/2023 0837   EOSABS 0.3 09/09/2023 0837  EOSABS 0.3 08/18/2023 1010   BASOSABS 0.0 09/09/2023 0837   BASOSABS 0.0 08/18/2023 1010    CMP     Component Value Date/Time   NA 141 09/09/2023 0837   NA 142 08/18/2023 1010   K 3.8 09/09/2023 0837   CL 109 09/09/2023 0837   CO2 23 09/09/2023 0837    GLUCOSE 88 09/09/2023 0837   BUN 20 09/09/2023 0837   BUN 16 08/18/2023 1010   CREATININE 1.29 (H) 09/09/2023 0837   CREATININE 1.18 12/05/2015 0841   CALCIUM  9.4 09/09/2023 0837   PROT 6.9 09/09/2023 0837   PROT 6.6 08/18/2023 1010   ALBUMIN 4.0 09/09/2023 0837   ALBUMIN 4.2 08/18/2023 1010   AST 17 09/09/2023 0837   ALT 7 09/09/2023 0837   ALKPHOS 76 09/09/2023 0837   BILITOT 0.4 09/09/2023 0837   GFRNONAA >60 09/09/2023 0837   GFRAA >60 05/06/2018 0805    Assessment: 1.  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  Constipation: Worsened by opioids the patient is using for above, currently on Senokot which only says sort of works  Plan: 1.  Prescribed Movantik 25 mg daily, patient is unable to afford this could trial Linzess  to 90 daily or an over-the-counter product if that is too expensive 2.  Discussed rectal pain with the patient.  There is no obvious fistula today.  This was previously taking care of in August 2024.  Also no obvious fissure.  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  We discussed this today. 3.  Went ahead and prescribed Hydrocortisone suppositories twice daily x 7 days #14 with 1 refill 4.  If above does not help at follow-up would need to discuss reevaluation with possible flex sigmoidoscopy or other. 5.  Patient to follow in clinic with me in 2 to 3 months.  Matthew Failing, PA-C [facility] 12/28/2023, 8:33 AM  Cc: Vicci Barnie NOVAK, MD

## 2023-12-28 NOTE — Patient Instructions (Signed)
 We have sent the following medications to your pharmacy for you to pick up at your convenience: Hydrocortisone suppository twice daily.   _______________________________________________________  If your blood pressure at your visit was 140/90 or greater, please contact your primary care physician to follow up on this.  _______________________________________________________  If you are age 67 or older, your body mass index should be between 23-30. Your Body mass index is 34.17 kg/m. If this is out of the aforementioned range listed, please consider follow up with your Primary Care Provider.  If you are age 1 or younger, your body mass index should be between 19-25. Your Body mass index is 34.17 kg/m. If this is out of the aformentioned range listed, please consider follow up with your Primary Care Provider.   ________________________________________________________  The [facility] providers would like to encourage you to use MYCHART to communicate with providers for non-urgent requests or questions.  Due to long hold times on the telephone, sending your provider a message by [facility] may be a faster and more efficient way to get a response.  Please allow 48 business hours for a response.  Please remember that this is for non-urgent requests.  _______________________________________________________

## 2024-01-03 ENCOUNTER — Telehealth: Payer: Self-pay

## 2024-01-03 NOTE — Telephone Encounter (Signed)
 Copied from CRM [id number]. Topic: Clinical - Medication Question >> Jan 03, 2024  2:45 PM [name] wrote: Reason for CRM: Patient is wanting to refill his Mounjaro but I do not see it on his medication list, states he used to be on it for weight loss and was wondering if Dr. Vicci would prescribe it to him again, please advise. # [phone]   [id number] [address] [place], [place] - [address]

## 2024-01-04 NOTE — Telephone Encounter (Signed)
 Called patient verified name and date of birth, Patient has been scheduled for 01/23/2024 at 9:10 am. Patient acknowledged appointment.

## 2024-01-04 NOTE — Telephone Encounter (Signed)
 Noted! Thank you

## 2024-01-04 NOTE — Telephone Encounter (Signed)
Needs appt. Can be video.

## 2024-01-09 ENCOUNTER — Other Ambulatory Visit: Payer: Self-pay | Admitting: Internal Medicine

## 2024-01-09 DIAGNOSIS — G4709 Other insomnia: Secondary | ICD-10-CM

## 2024-01-10 ENCOUNTER — Telehealth: Payer: Self-pay | Admitting: Physician Assistant

## 2024-01-10 ENCOUNTER — Other Ambulatory Visit: Payer: Self-pay | Admitting: Physician Assistant

## 2024-01-10 DIAGNOSIS — K219 Gastro-esophageal reflux disease without esophagitis: Secondary | ICD-10-CM

## 2024-01-10 NOTE — Telephone Encounter (Signed)
 Spoke with the patient. He is on day 6 of the hydrocortisone  suppositories. He cannot tell any improvement in his rectal pain. Tells me no improvement and the symptom is the same. Should he use the suppositories for a full 14 days?

## 2024-01-10 NOTE — Telephone Encounter (Signed)
 Patient called and stated that he seen Matthew Colon on June the 26 th and was prescribed some suppositories. Patient is stating that he was wondering if he needs to continue taking the suppositories until he is able to be seen again or when should her stop taking them. Patient is requesting a call back. Please advise.

## 2024-01-11 NOTE — Telephone Encounter (Signed)
 PT is calling to get an update on his request to schedule a flex sig.  He is also calling because he wanted to know if a nurse was still going to call in a medication for constipation. Please advise.

## 2024-01-12 ENCOUNTER — Ambulatory Visit: Admitting: Internal Medicine

## 2024-01-12 NOTE — Telephone Encounter (Signed)
 Do you want him to continue the suppositories?

## 2024-01-12 NOTE — Telephone Encounter (Signed)
 No answer. No voicemail.

## 2024-01-16 ENCOUNTER — Other Ambulatory Visit: Payer: Self-pay

## 2024-01-16 DIAGNOSIS — K6289 Other specified diseases of anus and rectum: Secondary | ICD-10-CM

## 2024-01-16 DIAGNOSIS — L988 Other specified disorders of the skin and subcutaneous tissue: Secondary | ICD-10-CM

## 2024-01-16 NOTE — Telephone Encounter (Signed)
 Please schedule CT or MRI pelvis ASAP to evaluate for possible recurrent fistula, he had 1 in 2024.  Repeat flex sig will be low yield for evaluation of rectal pain.  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 If he is not having any significant relief from suppositories, advise patient to hold off using them

## 2024-01-16 NOTE — Telephone Encounter (Signed)
 Mailbox is full and cannot accept any messages.

## 2024-01-17 ENCOUNTER — Ambulatory Visit (HOSPITAL_BASED_OUTPATIENT_CLINIC_OR_DEPARTMENT_OTHER): Admitting: Student

## 2024-01-17 NOTE — Telephone Encounter (Signed)
 Patient agrees to this plan. CT ordered. Scheduling notified.

## 2024-01-19 ENCOUNTER — Telehealth: Payer: Self-pay | Admitting: Internal Medicine

## 2024-01-19 NOTE — Telephone Encounter (Signed)
 Called patient to confirm upcoming appointment 01/23/2024 at 9:10 am with Dr.Johnson. Patient appointment has been successfully confirmed.

## 2024-01-22 ENCOUNTER — Ambulatory Visit (HOSPITAL_BASED_OUTPATIENT_CLINIC_OR_DEPARTMENT_OTHER)
Admission: RE | Admit: 2024-01-22 | Discharge: 2024-01-22 | Disposition: A | Discharge status: Home / Self Care | Source: Ambulatory Visit | Attending: Physician Assistant | Admitting: Physician Assistant

## 2024-01-22 DIAGNOSIS — L988 Other specified disorders of the skin and subcutaneous tissue: Secondary | ICD-10-CM | POA: Insufficient documentation

## 2024-01-22 DIAGNOSIS — K6289 Other specified diseases of anus and rectum: Secondary | ICD-10-CM | POA: Diagnosis present

## 2024-01-22 MED ORDER — IOHEXOL 300 MG/ML  SOLN
100.0000 mL | Freq: Once | INTRAMUSCULAR | Status: AC | PRN
  Administered 2024-01-22: 100 mL via INTRAVENOUS

## 2024-01-23 ENCOUNTER — Other Ambulatory Visit: Payer: Self-pay | Admitting: Internal Medicine

## 2024-01-23 ENCOUNTER — Ambulatory Visit: Admitting: Internal Medicine

## 2024-01-23 DIAGNOSIS — E1159 Type 2 diabetes mellitus with other circulatory complications: Secondary | ICD-10-CM

## 2024-01-24 ENCOUNTER — Telehealth: Payer: Self-pay | Admitting: Physician Assistant

## 2024-01-24 NOTE — Telephone Encounter (Signed)
 The pt has been advised that the CT has not been reviewed as of this afternoon.  We will call as soon as able

## 2024-01-24 NOTE — Telephone Encounter (Signed)
 Patient called and stated that he has not heard for anyone in our office about his Ct imaging result. Patient is requesting to speak to someone in regards to those results. Please advise.

## 2024-01-25 ENCOUNTER — Ambulatory Visit: Payer: Self-pay | Admitting: Gastroenterology

## 2024-01-27 ENCOUNTER — Telehealth: Payer: Self-pay | Admitting: Physician Assistant

## 2024-01-27 NOTE — Telephone Encounter (Signed)
 Inbound call from patient returning missed call Requesting a call back  Please advise  thank you

## 2024-01-27 NOTE — Telephone Encounter (Signed)
 Documented in result note encounter. Closing this encounter.

## 2024-01-30 ENCOUNTER — Telehealth: Payer: Self-pay | Admitting: Internal Medicine

## 2024-01-30 NOTE — Progress Notes (Signed)
 Referral sent to [facility] at this time.

## 2024-01-30 NOTE — Telephone Encounter (Signed)
 Confirmed appt 7/29

## 2024-01-31 ENCOUNTER — Ambulatory Visit: Attending: Internal Medicine | Admitting: Internal Medicine

## 2024-01-31 ENCOUNTER — Encounter: Payer: Self-pay | Admitting: Internal Medicine

## 2024-01-31 VITALS — BP 100/62 | HR 82 | Temp 97.7°F | Ht 71.0 in | Wt 247.0 lb

## 2024-01-31 DIAGNOSIS — N521 Erectile dysfunction due to diseases classified elsewhere: Secondary | ICD-10-CM

## 2024-01-31 DIAGNOSIS — Z7985 Long-term (current) use of injectable non-insulin antidiabetic drugs: Secondary | ICD-10-CM | POA: Diagnosis not present

## 2024-01-31 DIAGNOSIS — E1169 Type 2 diabetes mellitus with other specified complication: Secondary | ICD-10-CM | POA: Diagnosis not present

## 2024-01-31 DIAGNOSIS — Z6834 Body mass index (BMI) 34.0-34.9, adult: Secondary | ICD-10-CM

## 2024-01-31 DIAGNOSIS — E669 Obesity, unspecified: Secondary | ICD-10-CM | POA: Diagnosis not present

## 2024-01-31 MED ORDER — TADALAFIL 20 MG PO TABS: 20.0000 mg | ORAL_TABLET | Freq: Every day | ORAL | 6 refills | Status: DC | PRN

## 2024-01-31 MED ORDER — TIRZEPATIDE 2.5 MG/0.5ML ~~LOC~~ SOAJ: 2.5000 mg | SUBCUTANEOUS | 0 refills | Status: AC

## 2024-01-31 NOTE — Telephone Encounter (Signed)
 Confirmation received regarding fax to [facility].

## 2024-01-31 NOTE — Progress Notes (Signed)
 Patient ID: Matthew Truxillo Sr., male    DOB: 31-Jan-1956  MRN: [id number]  CC: Medication Refill (Medication f/u. Med refill. [facility]ne rx for Mounjaro  /)   Subjective: Matthew Colon is a 68 y.o. male who presents for UC visit. His concerns today include:  Patient with history of HTN, DM peripheral neuropathy, HL, GERD, OA BL knees and LT shoulder, prostate cancer (XRT 2023 and androgen deprivation therapy  Completed Q 6 mths Eligard  inj 09/2023), anal fistula repair 02/2023, ED, normocytic anemia on OTC iron, vitamin D  deficiency,former smoker   Discussed the use of AI scribe software for clinical note transcription with the patient, who gave verbal consent to proceed.  History of Present Illness Matthew Seufert Sr. is a 68 year old male with diabetes who presents for consideration of Mounjaro  for weight management.  He has a history of diabetes, currently managed with metformin  500 mg once daily, and is experiencing persistent hunger and difficulty losing weight despite regular exercise.  He previously used Mounjaro  two years ago for about a year, during which he lost approximately 15 to 20 pounds without experiencing any side effects.  He has his eating habits under control, rarely consuming sweets, and avoids sweet drinks like sodas and juices. He checks his blood sugars three to four times a month, typically when he feels symptomatic, such as feeling 'funny,' sweaty, hot, or dizzy. He has a history of visiting a retina specialist at [facility], with his last visit approximately six weeks ago.  Requests refill on Cialis  for ED.    Patient Active Problem List   Diagnosis Date Noted   Prostate cancer (HCC) 02/25/2022   Drug-induced constipation 07/21/2021   Malignant neoplasm of prostate (HCC) 07/21/2021   Hyperlipidemia associated with type 2 diabetes mellitus (HCC) 11/13/2020   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   DDD (degenerative disc disease), cervical 09/21/2017    DJD of acromioclavicular joint (Right) 09/21/2017   DJD of acromioclavicular joint (Bilateral) 09/21/2017   DJD of acromioclavicular joint (Left) 09/21/2017   Osteoarthritis of glenohumeral joint (Left) 09/21/2017   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/2017   Tricompartmental disease of knee (Bilateral) 09/21/2017   Osteoarthritis of knees (Bilateral) 09/21/2017   Suprapatellar effusion of knee (recurrent) (Right) 09/21/2017   Calcaneal spur of feet (Bilateral) 09/21/2017   Hypomagnesemia 09/20/2017   Low testosterone  09/20/2017   Chronic foot pain (Bilateral) 09/20/2017   Vitamin D  deficiency 04/04/2017   Chronic shoulder pain (Primary Area of Pain) (Left) 03/30/2017   Chronic knee pain (Tertiary Area of Pain) (Left) 03/30/2017   Disorder of bone, unspecified 03/30/2017   Other reduced mobility 03/30/2017   Other specified health status 03/30/2017   Opioid use agreement exists 03/30/2017   Opiate use 03/30/2017   Chronic pain syndrome 03/30/2017   Chronic upper extremity pain (Secondary Area of Pain) (Bilateral) 03/30/2017   Elevated PSA 12/05/2015   Noncompliance 12/05/2015   Hypertension associated with diabetes (HCC) 03/14/2015   Type 2 diabetes mellitus with diabetic mononeuropathy (HCC) 03/14/2015   Encounter for health maintenance examination in adult 03/14/2015   Screening for prostate cancer 03/14/2015   Special screening for malignant neoplasms, colon 03/14/2015   Hyperlipidemia 03/14/2015   Nocturia 03/14/2015   Urinary frequency 03/14/2015   Need for prophylactic vaccination and inoculation against influenza 03/14/2015   Need for prophylactic vaccination against Streptococcus pneumoniae (pneumococcus) 03/14/2015   Need for Tdap vaccination 03/14/2015   Chronic nausea 03/14/2015   Gastroesophageal reflux disease without esophagitis 03/14/2015     Current  Outpatient Medications on File Prior to Visit  Medication Sig Dispense Refill   alfuzosin  (UROXATRAL ) 10  MG 24 hr tablet Take 1 tablet (10 mg total) by mouth daily with breakfast. 30 tablet 11   atorvastatin  (LIPITOR) 40 MG tablet Take 1 tablet (40 mg total) by mouth daily. 90 tablet 1   calcium  carbonate (OS-CAL - DOSED IN MG OF ELEMENTAL CALCIUM ) 1250 (500 Ca) MG tablet Take 1 tablet by mouth daily with breakfast.     cholecalciferol (VITAMIN D3) 25 MCG (1000 UNIT) tablet Take 1,000 Units by mouth daily.     ferrous sulfate  325 (65 FE) MG EC tablet Take 1 tablet (325 mg total) by mouth daily with breakfast. 30 tablet 6   fluticasone  (FLONASE ) 50 MCG/ACT nasal spray SPRAY 2 SPRAYS INTO EACH NOSTRIL EVERY DAY 48 mL 1   folic acid  (FOLVITE ) 1 MG tablet TAKE 1 TABLET BY MOUTH EVERY DAY 90 tablet 2   gabapentin  (NEURONTIN ) 600 MG tablet Take 1 tablet (600 mg total) by mouth 3 (three) times daily. Needs to keep appointment 11/15/2023 for additional refills. 270 tablet 1   hydrocortisone  (ANUSOL -HC) 25 MG suppository Place 1 suppository (25 mg total) rectally every 12 (twelve) hours. 14 suppository 2   meloxicam  (MOBIC ) 7.5 MG tablet TAKE 1 TABLET (7.5 MG TOTAL) BY MOUTH DAILY. PLEASE MAKE AN APPT 90 tablet 0   metFORMIN  (GLUCOPHAGE -XR) 500 MG 24 hr tablet TAKE 1 TABLET BY MOUTH EVERY DAY WITH BREAKFAST 90 tablet 1   naloxegol  oxalate (MOVANTIK ) 25 MG TABS tablet Take 1 tablet (25 mg total) by mouth daily. 30 tablet 3   olmesartan  (BENICAR ) 20 MG tablet TAKE 1 TABLET BY MOUTH EVERY DAY 90 tablet 1   ondansetron  (ZOFRAN ) 4 MG tablet Take 1 tablet (4 mg total) by mouth every 8 (eight) hours as needed for nausea or vomiting. 20 tablet 0   oxyCODONE  (OXY IR/ROXICODONE ) 5 MG immediate release tablet Take 1 tablet (5 mg total) by mouth every 6 (six) hours as needed for severe pain. 20 tablet 0   pantoprazole  (PROTONIX ) 20 MG tablet TAKE 1 TABLET BY MOUTH EVERY DAY 30 tablet 2   polyethylene glycol (MIRALAX / GLYCOLAX) 17 g packet Take by mouth.     senna (SENOKOT) 8.6 MG tablet Take 1 tablet (8.6 mg total) by  mouth daily as needed for constipation. 30 tablet 3   traZODone  (DESYREL ) 50 MG tablet TAKE 1/2 TO 1 TABLET BY MOUTH AT BEDTIME AS NEEDED FOR SLEEP 90 tablet 1   No current facility-administered medications on file prior to visit.    No Known Allergies  Social History   Socioeconomic History   Marital status: Divorced    Spouse name: Not on file   Number of children: 3   Years of education: Not on file   Highest education level: Not on file  Occupational History   Occupation: switcher/ spotter/forklift  Tobacco Use   Smoking status: Former    Types: Cigarettes    Passive exposure: Current (1 1/2 mnth)   Smokeless tobacco: Never  Vaping Use   Vaping status: Never Used  Substance and Sexual Activity   Alcohol use: Not Currently    Comment: rare beer   Drug use: No   Sexual activity: Yes  Other Topics Concern   Not on file  Social History Narrative   Lives with his sister.   Works as a Engineer, civil (consulting) in [employer], water quality.   Exercise - active on the job.  Has 3  children in [place] .   Social Drivers of Health   Financial Resource Strain: Medium Risk (08/19/2023)   Overall Financial Resource Strain (CARDIA)    Difficulty of Paying Living Expenses: Somewhat hard  Food Insecurity: No Food Insecurity (08/19/2023)   Hunger Vital Sign    Worried About Running Out of Food in the Last Year: Never true    Ran Out of Food in the Last Year: Never true  Transportation Needs: No Transportation Needs (08/19/2023)   PRAPARE - Administrator, Civil Service (Medical): No    Lack of Transportation (Non-Medical): No  Physical Activity: Insufficiently Active (08/19/2023)   Exercise Vital Sign    Days of Exercise per Week: 2 days    Minutes of Exercise per Session: 30 min  Stress: Stress Concern Present (08/19/2023)   [employer] of Occupational Health - Occupational Stress Questionnaire    Feeling of Stress : Rather much  Social Connections: Socially Isolated (08/19/2023)   Social  Connection and Isolation Panel    Frequency of Communication with Friends and Family: Once a week    Frequency of Social Gatherings with Friends and Family: Never    Attends Religious Services: Never    Database administrator or Organizations: No    Attends Banker Meetings: Never    Marital Status: Divorced  Catering manager Violence: Not At Risk (05/17/2023)   Humiliation, Afraid, Rape, and Kick questionnaire    Fear of Current or Ex-Partner: No    Emotionally Abused: No    Physically Abused: No    Sexually Abused: No    Family History  Problem Relation Age of Onset   Diabetes Mother    Hypertension Mother    Other Father        murdered   Diabetes Sister    Arthritis Sister    Heart disease Sister    Diabetes Sister    Diabetes Sister    Cancer Neg Hx    Stroke Neg Hx    Prostate cancer Neg Hx        unknown   Bladder Cancer Neg Hx        unkown   Kidney cancer Neg Hx        unkown    Past Surgical History:  Procedure Laterality Date   COLONOSCOPY     referral pending 03/2015   FISTULOTOMY N/A 02/11/2023   Procedure: INTERROGATION OF ANAL FISTULA WITH FISTULOTOMY;  Surgeon: Debby Hila, MD;  Location: [facility];  Service: General;  Laterality: N/A;   NO PAST SURGERIES     as of 03/2015    ROS: Review of Systems Negative except as stated above  PHYSICAL EXAM: BP 100/62 (BP Location: Left Arm, Patient Position: Sitting, Cuff Size: Normal)   Pulse 82   Temp 97.7 F (36.5 C) (Oral)   Ht 5' 11 (1.803 m)   Wt 247 lb (112 kg)   SpO2 97%   BMI 34.45 kg/m   Wt Readings from Last 3 Encounters:  01/31/24 247 lb (112 kg)  12/28/23 245 lb (111.1 kg)  11/15/23 250 lb (113.4 kg)    Physical Exam   General appearance - alert, well appearing, older AAM and in no distress Mental status - pt with flat affect and quiet demeanor  Lab Results  Component Value Date   HGBA1C 5.6 11/15/2023       Latest Ref Rng & Units 09/09/2023     8:37 AM 08/18/2023   10:10  AM 03/11/2023    8:06 AM  CMP  Glucose 70 - 99 mg/dL 88  884  891   BUN 8 - 23 mg/dL 20  16  23    Creatinine 0.61 - 1.24 mg/dL 8.70  8.99  8.44   Sodium 135 - 145 mmol/L 141  142  144   Potassium 3.5 - 5.1 mmol/L 3.8  4.9  4.3   Chloride 98 - 111 mmol/L 109  106  111   CO2 22 - 32 mmol/L 23  22  24    Calcium  8.9 - 10.3 mg/dL 9.4  89.5  9.6   Total Protein 6.5 - 8.1 g/dL 6.9  6.6  6.7   Total Bilirubin 0.0 - 1.2 mg/dL 0.4  0.5  0.3   Alkaline Phos 38 - 126 U/L 76  104  78   AST 15 - 41 U/L 17  15  15    ALT 0 - 44 U/L 7  10  8     Lipid Panel     Component Value Date/Time   CHOL 199 11/15/2023 1012   TRIG 40 11/15/2023 1012   HDL 87 11/15/2023 1012   CHOLHDL 2.3 11/15/2023 1012   CHOLHDL 2.8 03/14/2015 0001   VLDL 17 03/14/2015 0001   LDLCALC 104 (H) 11/15/2023 1012    CBC    Component Value Date/Time   WBC 7.0 09/09/2023 0837   WBC 10.9 (H) 05/06/2018 0805   RBC 3.61 (L) 09/09/2023 0837   HGB 10.5 (L) 09/09/2023 0837   HGB 11.6 (L) 08/18/2023 1010   HCT 31.4 (L) 09/09/2023 0837   HCT 36.2 (L) 08/18/2023 1010   PLT 357 09/09/2023 0837   PLT 356 08/18/2023 1010   MCV 87.0 09/09/2023 0837   MCV 91 08/18/2023 1010   MCH 29.1 09/09/2023 0837   MCHC 33.4 09/09/2023 0837   RDW 14.9 09/09/2023 0837   RDW 13.2 08/18/2023 1010   LYMPHSABS 1.2 09/09/2023 0837   LYMPHSABS 1.2 08/18/2023 1010   MONOABS 0.5 09/09/2023 0837   EOSABS 0.3 09/09/2023 0837   EOSABS 0.3 08/18/2023 1010   BASOSABS 0.0 09/09/2023 0837   BASOSABS 0.0 08/18/2023 1010    ASSESSMENT AND PLAN: 1. Type 2 diabetes mellitus with obesity (HCC) (Primary) Patient with type 2 diabetes and obesity.  Patient is desiring greater weight loss.  Reports being on Mounjaro  about 2 years ago at which time he was able to lose 15 to 20 pounds.  He would like to get back on it.   I went over potential side effects including nausea, vomiting, diarrhea/constipation, bowel blockage, palpitations and  pancreatitis.  Advised to stop the medicine and be seen if pt develops any abdominal pain, vomiting, severe diarrhea/constipation or palpitations. -We will start him on the 2.5 mg dose.  After being on it for 1 month, if he is tolerating the medication, he should let me know at which time we will increase it to the 5 mg.  Patient to take once a week.  Advised to have the pharmacist show him how to administer the medication in the event he has forgotten. -Advised to stop the metformin  after he has been on the Mounjaro  for 2 weeks. - tirzepatide  (MOUNJARO ) 2.5 MG/0.5ML Pen; Inject 2.5 mg into the skin once a week.  Dispense: 2 mL; Refill: 0 - Microalbumin / creatinine urine ratio  2. Erectile dysfunction associated with type 2 diabetes mellitus (HCC) - tadalafil  (CIALIS ) 20 MG tablet; Take 1 tablet (20 mg total) by mouth daily  as needed for erectile dysfunction (take 45 minutes prior to sexual activity).  Dispense: 30 tablet; Refill: 6  Patient was given the opportunity to ask questions.  Patient verbalized understanding of the plan and was able to repeat key elements of the plan.   This documentation was completed using Paediatric nurse.  Any transcriptional errors are unintentional.  Orders Placed This Encounter  Procedures   Microalbumin / creatinine urine ratio     Requested Prescriptions   Signed Prescriptions Disp Refills   tirzepatide  (MOUNJARO ) 2.5 MG/0.5ML Pen 2 mL 0    Sig: Inject 2.5 mg into the skin once a week.   tadalafil  (CIALIS ) 20 MG tablet 30 tablet 6    Sig: Take 1 tablet (20 mg total) by mouth daily as needed for erectile dysfunction (take 45 minutes prior to sexual activity).    Keep future appointment as previously scheduled. Barnie Louder, MD, FACP

## 2024-01-31 NOTE — Patient Instructions (Signed)
 Start Mounjaro  2.5 mg once a week.  If you do not recall how to do the injection, please asked the pharmacist to show you how to do the injection once the medication has been dispensed. Potential side effects of this medication includes including nausea, vomiting, diarrhea/constipation, bowel blockage, palpitations and pancreatitis.  Stop  the medicine and be seen if you develop any abdominal pain, vomiting, severe diarrhea/constipation or palpitations. After being on the Mounjaro  for 2 weeks, you can stop the metformin . Please check your blood sugars at least 3-4 times a week in the mornings before breakfast.  The goal is to be between 90-130.

## 2024-02-02 ENCOUNTER — Ambulatory Visit: Payer: Self-pay | Admitting: Internal Medicine

## 2024-02-02 ENCOUNTER — Other Ambulatory Visit: Admitting: Urology

## 2024-02-02 ENCOUNTER — Telehealth: Payer: Self-pay | Admitting: Urology

## 2024-02-02 LAB — MICROALBUMIN / CREATININE URINE RATIO
Creatinine, Urine: 172.8 mg/dL
Microalb/Creat Ratio: 89 mg/g{creat} — ABNORMAL HIGH (ref 0–29)
Microalbumin, Urine: 154.3 ug/mL

## 2024-02-02 NOTE — Telephone Encounter (Signed)
 Patient called this morning to cancel his Cystoscopy appt for today, and he is wanting to know if it is still needed. He said he finished his antibiotic, and is feeling better. He is rescheduled for 03/14/24, but will cancel if Dr. Francisca thinks he doesn't need to come. Please advise patient.

## 2024-02-07 NOTE — Telephone Encounter (Signed)
 Patient aware.

## 2024-02-10 ENCOUNTER — Other Ambulatory Visit: Payer: Self-pay | Admitting: Physician Assistant

## 2024-02-10 DIAGNOSIS — E1169 Type 2 diabetes mellitus with other specified complication: Secondary | ICD-10-CM

## 2024-03-06 NOTE — Telephone Encounter (Signed)
 Inbound call from pt requesting to speak to the nurse in regards to his CT. Patient is requesting a call back. please advise.

## 2024-03-10 ENCOUNTER — Other Ambulatory Visit: Payer: Self-pay | Admitting: Internal Medicine

## 2024-03-10 DIAGNOSIS — E1169 Type 2 diabetes mellitus with other specified complication: Secondary | ICD-10-CM

## 2024-03-11 ENCOUNTER — Other Ambulatory Visit: Payer: Self-pay | Admitting: Hematology and Oncology

## 2024-03-11 DIAGNOSIS — C61 Malignant neoplasm of prostate: Secondary | ICD-10-CM

## 2024-03-11 NOTE — Progress Notes (Unsigned)
 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   ONCOLOGIC HISTORY: Initially presented with elevated PSA dating back to at least 2016, at which time it was 9 and increased further to 9.6 in 2017. Found to have elevated PSA of 17.6 throught his PCP.  02/05/2021: Established care with urologist, Dr. Francisca 03/13/2021: MRI: Diffuse abnormal signal throughout the peripheral zone, commonly seen in setting of prostatitis, however there were areas that appeared to extend beyond capsule at the base (PI-RADS 3, with some concern for diffuse prostate neoplasm). There was indeterminate low signal extending to the seminal vesicles but no evidence of pelvic metastatic disease or bony lesions.   07/03/2021: Underwent transrectal ultrasound with 12 biopsies of the prostate on 07/03/21.  The prostate volume measured 31 cc.  Out of 12 core biopsies, all 12 were positive.  The maximum Gleason score was 5+5, and this was seen in the right lateral base and right lateral mid. Additionally, Gleason 5+4 was seen in the left lateral apex, left lateral mid, left lateral base, right base, left apex, left mid, and left base, Gleason 4+5 was seen in the right mid, and Gleason 3+4 in the right apex and right lateral apex. Per diagnostic comment, the right apex and right lateral apex biopsies contain adenocarcinoma with mostly Gleason pattern 3, with discontinuous foci of pattern 4 and 5. This finding does not change the overall diagnosis  of extensive grade group 5 disease.  07/30/2021: Bone scan showed no evidence of osseous metastases.  08/11/2021: Started Eligard  45 mg injection every 6 months. 11/02/2021-12/28/2021: Received curative,definitive radiation to the prostate, seminal vesicles and pelvic lymph nodes initially to 45 Gy in 25 fractions of 1.8 Gy.The prostate and PET-positive nodes were boosted to 75 Gy with 15 additional fractions of 2.0 Gy    HISTORY OF PRESENTING ILLNESS:  Matthew Benders Sr. 68 y.o. male returns for a follow up for prostate cancer with left pelvic lymphadenopathy. He was last seen on 09/09/2023. In the interim, he has not had any major changes in his health.   On exam today, [name] reports he continues to have issues with hot flashes and low energy.  He reports he is also still deeply concerned that when he has sex and orgasms he does not produce any ejaculate.  He reports that he does currently take Cialis  and does not have any issues acquiring or maintaining an erection.  He reports that otherwise he has had no trouble with bleeding, bruising, or dark stools.  He notes he is eating well with a strong appetite.  He reports his energy level does remain weak and that he does feel cold.  He does drink energy drinks from time to time to try to bolster his energy.  Additionally he stopped taking his folic acid  and iron when he was having UTI symptoms.  He has not yet restarted these.  He otherwise denies any fevers, chills, sweats, nausea, vomiting or diarrhea.  Full 10 point ROS is otherwise negative.  MEDICAL HISTORY:  Past Medical History:  Diagnosis Date   Anal fistula  Arthritis    Foot pain, bilateral    referred to podiatry 06/2014   GERD (gastroesophageal reflux disease)    long history of   Hyperlipidemia    Hypertension    Polyarthralgia    shoulders, knees, ankles, wrists; neg rheum lab screen 04/2014   Prostate cancer (HCC)    Type 2 diabetes mellitus with diabetic neuropathy (HCC)     Wears glasses    Wears partial dentures upper     SURGICAL HISTORY: Past Surgical History:  Procedure Laterality Date   COLONOSCOPY     referral pending 03/2015   FISTULOTOMY N/A 02/11/2023   Procedure: INTERROGATION OF ANAL FISTULA WITH FISTULOTOMY;  Surgeon: Debby Hila, MD;  Location: [facility];  Service: General;  Laterality: N/A;   NO PAST SURGERIES     as of 03/2015    SOCIAL HISTORY: Social History   Socioeconomic History   Marital status: Divorced    Spouse name: Not on file   Number of children: 3   Years of education: Not on file   Highest education level: Not on file  Occupational History   Occupation: switcher/ spotter/forklift  Tobacco Use   Smoking status: Former    Types: Cigarettes    Passive exposure: Current (1 1/2 mnth)   Smokeless tobacco: Never  Vaping Use   Vaping status: Never Used  Substance and Sexual Activity   Alcohol use: Not Currently    Comment: rare beer   Drug use: No   Sexual activity: Yes  Other Topics Concern   Not on file  Social History Narrative   Lives with his sister.   Works as a Engineer, civil (consulting) in [employer], [employer].   Exercise - active on the job.  Has 3 children in [place] .   Social Drivers of Health   Financial Resource Strain: Medium Risk (08/19/2023)   Overall Financial Resource Strain (CARDIA)    Difficulty of Paying Living Expenses: Somewhat hard  Food Insecurity: No Food Insecurity (08/19/2023)   Hunger Vital Sign    Worried About Running Out of Food in the Last Year: Never true    Ran Out of Food in the Last Year: Never true  Transportation Needs: No Transportation Needs (08/19/2023)   PRAPARE - Administrator, Civil Service (Medical): No    Lack of Transportation (Non-Medical): No  Physical Activity: Insufficiently Active (08/19/2023)   Exercise Vital Sign    Days of Exercise per Week: 2 days    Minutes of Exercise per Session: 30 min  Stress: Stress Concern Present (08/19/2023)   [employer] - Occupational Stress Questionnaire    Feeling of Stress : Rather much  Social Connections: Socially Isolated (08/19/2023)   Social Connection and Isolation Panel    Frequency of Communication with Friends and Family: Once a week    Frequency of Social Gatherings with Friends and Family: Never    Attends Religious Services: Never    Database administrator or Organizations: No    Attends Banker Meetings: Never    Marital Status: Divorced  Catering manager Violence: Not At Risk (05/17/2023)   Humiliation, Afraid, Rape, and Kick questionnaire    Fear of Current or Ex-Partner: No    Emotionally Abused: No    Physically Abused: No    Sexually Abused: No    FAMILY HISTORY: Family History  Problem Relation Age of Onset   Diabetes Mother    Hypertension Mother    Other Father  murdered   Diabetes Sister    Arthritis Sister    Heart disease Sister    Diabetes Sister    Diabetes Sister    Cancer Neg Hx    Stroke Neg Hx    Prostate cancer Neg Hx        unknown   Bladder Cancer Neg Hx        unkown   Kidney cancer Neg Hx        unkown    ALLERGIES:  has no known allergies.  MEDICATIONS:  Current Outpatient Medications  Medication Sig Dispense Refill   alfuzosin  (UROXATRAL ) 10 MG 24 hr tablet Take 1 tablet (10 mg total) by mouth daily with breakfast. 30 tablet 11   atorvastatin  (LIPITOR) 40 MG tablet TAKE 1 TABLET BY MOUTH EVERY DAY 90 tablet 0   calcium  carbonate (OS-CAL - DOSED IN MG OF ELEMENTAL CALCIUM ) 1250 (500 Ca) MG tablet Take 1 tablet by mouth daily with breakfast.     cholecalciferol (VITAMIN D3) 25 MCG (1000 UNIT) tablet Take 1,000 Units by mouth daily.     ferrous sulfate  325 (65 FE) MG EC tablet Take 1 tablet (325 mg total) by mouth daily with breakfast. 30 tablet 6   fluticasone  (FLONASE ) 50 MCG/ACT nasal spray SPRAY 2 SPRAYS INTO EACH NOSTRIL EVERY DAY 48 mL 1   folic acid  (FOLVITE ) 1 MG tablet TAKE 1 TABLET BY  MOUTH EVERY DAY 90 tablet 2   gabapentin  (NEURONTIN ) 600 MG tablet Take 1 tablet (600 mg total) by mouth 3 (three) times daily. Needs to keep appointment 11/15/2023 for additional refills. 270 tablet 1   hydrocortisone  (ANUSOL -HC) 25 MG suppository Place 1 suppository (25 mg total) rectally every 12 (twelve) hours. 14 suppository 2   meloxicam  (MOBIC ) 7.5 MG tablet TAKE 1 TABLET (7.5 MG TOTAL) BY MOUTH DAILY. PLEASE MAKE AN APPT 90 tablet 0   metFORMIN  (GLUCOPHAGE -XR) 500 MG 24 hr tablet TAKE 1 TABLET BY MOUTH EVERY DAY WITH BREAKFAST 90 tablet 1   naloxegol  oxalate (MOVANTIK ) 25 MG TABS tablet Take 1 tablet (25 mg total) by mouth daily. 30 tablet 3   olmesartan  (BENICAR ) 20 MG tablet TAKE 1 TABLET BY MOUTH EVERY DAY 90 tablet 1   ondansetron  (ZOFRAN ) 4 MG tablet Take 1 tablet (4 mg total) by mouth every 8 (eight) hours as needed for nausea or vomiting. 20 tablet 0   oxyCODONE  (OXY IR/ROXICODONE ) 5 MG immediate release tablet Take 1 tablet (5 mg total) by mouth every 6 (six) hours as needed for severe pain. 20 tablet 0   pantoprazole  (PROTONIX ) 20 MG tablet TAKE 1 TABLET BY MOUTH EVERY DAY 30 tablet 2   polyethylene glycol (MIRALAX / GLYCOLAX) 17 g packet Take by mouth.     senna (SENOKOT) 8.6 MG tablet Take 1 tablet (8.6 mg total) by mouth daily as needed for constipation. 30 tablet 3   tadalafil  (CIALIS ) 20 MG tablet Take 1 tablet (20 mg total) by mouth daily as needed for erectile dysfunction (take 45 minutes prior to sexual activity). 30 tablet 6   tirzepatide  (MOUNJARO ) 2.5 MG/0.5ML Pen Inject 2.5 mg into the skin once a week. 2 mL 0   traZODone  (DESYREL ) 50 MG tablet TAKE 1/2 TO 1 TABLET BY MOUTH AT BEDTIME AS NEEDED FOR SLEEP 90 tablet 1   No current facility-administered medications for this visit.    REVIEW OF SYSTEMS:   Constitutional: ( - ) fevers, ( - )  chills , ( - ) night sweats  Eyes: ( - ) blurriness of vision, ( - ) double vision, ( - ) watery eyes Ears, nose, mouth, throat, and  face: ( - ) mucositis, ( - ) sore throat Respiratory: ( - ) cough, ( - ) dyspnea, ( - ) wheezes Cardiovascular: ( - ) palpitation, ( - ) chest discomfort, ( - ) lower extremity swelling Gastrointestinal:  ( - ) nausea, ( - ) heartburn, ( - ) change in bowel habits Skin: ( - ) abnormal skin rashes Lymphatics: ( - ) new lymphadenopathy, ( - ) easy bruising Neurological: ( - ) numbness, ( - ) tingling, ( - ) new weaknesses Behavioral/Psych: ( - ) mood change, ( - ) new changes  All other systems were reviewed with the patient and are negative.  PHYSICAL EXAMINATION: ECOG PERFORMANCE STATUS: 1 - Symptomatic but completely ambulatory  Vitals:   03/12/24 0841  BP: 132/62  Pulse: 82  Resp: 17  Temp: 97.6 F (36.4 C)  SpO2: 99%     Filed Weights   03/12/24 0841  Weight: 240 lb (108.9 kg)      GENERAL: well appearing male in NAD  SKIN: skin color, texture, turgor are normal, no rashes or significant lesions EYES: conjunctiva are pink and non-injected, sclera clear LYMPH:  no palpable lymphadenopathy in the cervical or supraclavicular lymph nodes.  LUNGS: clear to auscultation and percussion with normal breathing effort HEART: regular rate & rhythm and no murmurs and no lower extremity edema Musculoskeletal: no cyanosis of digits and no clubbing  PSYCH: alert & oriented x 3, fluent speech NEURO: no focal motor/sensory deficits  LABORATORY DATA:  I have reviewed the data as listed    Latest Ref Rng & Units 03/12/2024    8:23 AM 09/09/2023    8:37 AM 08/18/2023   10:10 AM  CBC  WBC 4.0 - 10.5 K/uL 7.3  7.0  7.6   Hemoglobin 13.0 - 17.0 g/dL 88.5  89.4  88.3   Hematocrit 39.0 - 52.0 % 33.6  31.4  36.2   Platelets 150 - 400 K/uL 162  357  356        Latest Ref Rng & Units 03/12/2024    8:23 AM 09/09/2023    8:37 AM 08/18/2023   10:10 AM  CMP  Glucose 70 - 99 mg/dL 865  88  884   BUN 8 - 23 mg/dL 35  20  16   Creatinine 0.61 - 1.24 mg/dL 8.80  8.70  8.99   Sodium 135 - 145  mmol/L 141  141  142   Potassium 3.5 - 5.1 mmol/L 4.2  3.8  4.9   Chloride 98 - 111 mmol/L 109  109  106   CO2 22 - 32 mmol/L 25  23  22    Calcium  8.9 - 10.3 mg/dL 89.9  9.4  89.5   Total Protein 6.5 - 8.1 g/dL 7.0  6.9  6.6   Total Bilirubin 0.0 - 1.2 mg/dL 0.4  0.4  0.5   Alkaline Phos 38 - 126 U/L 65  76  104   AST 15 - 41 U/L 16  17  15    ALT 0 - 44 U/L 7  7  10      ASSESSMENT & PLAN Matthew Benders Sr. Is a 68 y.o. male who presents to the clinic for a follow up visit for advanced prostate cancer.   # Castration-sensitive advanced prostate cancer with limited pelvic adenopathy  --Started Eligard  45 injections q 6 months on  09/07/2022 --Received curative, definitive radiation to the prostate, seminal vesicles and pelvic lymph nodes from 11/02/2021-12/28/2021. --Patient is not interested in therapy escalation with abiraterone and prednisone or enzalutamide. He prefers to  continue with androgen deprivation therapy alone. --Under the care of Dr. Francisca, last seen on 08/10/2022.  PLAN: --completed 2 years of Eligard  post radiation therapy.  --Labs today show white blood cell 7.3, Hgb 11.4, MCV 87.5, Plt 162 --PSA and testosterone  levels pending. --return in 6 months for a follow up visit   #Lack of Ejaculate -- Patient reports that when he has an orgasm he does not produce any ejaculate. -- will request advice from urology.  Message sent to Dr. Francisca -- Concerned that his seminal vesicles may have been altered during radiation therapy resulting in the lack of ejaculate.  # Normocytic Anemia: --Etiology unknown --Labs today show iron sat of 14% with ferritin of 86 --Currently on p.o. iron therapy over-the-counter. No need for IV iron at this time.  --May also be a component of radiation to the pelvic bones causing the suppression of his blood counts --Can consider bone marrow biopsy to further evaluate if counts worsen or fail to improve over time. --Patient denies any bleeding  episodes.   #Fatigue: --Multifactorial including low testosterone  levels, anemia and component of inadequate nutrition.  --Encouraged to incorporate a balance diet with exercise daily. Advised to incorporate protein shakes with his diet.   #Nausea: -- zofran  4 mg q 8 hours PRN   No orders of the defined types were placed in this encounter.   All questions were answered. The patient knows to call the clinic with any problems, questions or concerns.  I have spent a total of 30 minutes minutes of face-to-face and non-face-to-face time, preparing to see the patient,  performing a medically appropriate examination, counseling and educating the patient, ordering tests/procedures,  documenting clinical information in the electronic health record,and care coordination.   Norleen IVAR Kidney, MD Department of Hematology/Oncology [facility] Phone: [phone] Pager: [phone] Email: [email or web address]

## 2024-03-12 ENCOUNTER — Inpatient Hospital Stay (HOSPITAL_BASED_OUTPATIENT_CLINIC_OR_DEPARTMENT_OTHER): Admitting: Hematology and Oncology

## 2024-03-12 ENCOUNTER — Inpatient Hospital Stay: Attending: Hematology and Oncology

## 2024-03-12 VITALS — BP 132/62 | HR 82 | Temp 97.6°F | Resp 17 | Ht 71.0 in | Wt 240.0 lb

## 2024-03-12 DIAGNOSIS — D649 Anemia, unspecified: Secondary | ICD-10-CM | POA: Diagnosis not present

## 2024-03-12 DIAGNOSIS — N5313 Anejaculatory orgasm: Secondary | ICD-10-CM | POA: Insufficient documentation

## 2024-03-12 DIAGNOSIS — C61 Malignant neoplasm of prostate: Secondary | ICD-10-CM | POA: Insufficient documentation

## 2024-03-12 DIAGNOSIS — Z87891 Personal history of nicotine dependence: Secondary | ICD-10-CM | POA: Insufficient documentation

## 2024-03-12 DIAGNOSIS — E291 Testicular hypofunction: Secondary | ICD-10-CM | POA: Insufficient documentation

## 2024-03-12 DIAGNOSIS — R1110 Vomiting, unspecified: Secondary | ICD-10-CM | POA: Diagnosis not present

## 2024-03-12 DIAGNOSIS — C775 Secondary and unspecified malignant neoplasm of intrapelvic lymph nodes: Secondary | ICD-10-CM | POA: Diagnosis not present

## 2024-03-12 DIAGNOSIS — R5383 Other fatigue: Secondary | ICD-10-CM | POA: Diagnosis not present

## 2024-03-12 LAB — CMP (CANCER CENTER ONLY)
ALT: 7 U/L (ref 0–44)
AST: 16 U/L (ref 15–41)
Albumin: 4 g/dL (ref 3.5–5.0)
Alkaline Phosphatase: 65 U/L (ref 38–126)
Anion gap: 7 (ref 5–15)
BUN: 35 mg/dL — ABNORMAL HIGH (ref 8–23)
CO2: 25 mmol/L (ref 22–32)
Calcium: 10 mg/dL (ref 8.9–10.3)
Chloride: 109 mmol/L (ref 98–111)
Creatinine: 1.19 mg/dL (ref 0.61–1.24)
GFR, Estimated: >60 mL/min (ref >60)
Glucose, Bld: 134 mg/dL — ABNORMAL HIGH (ref 70–99)
Potassium: 4.2 mmol/L (ref 3.5–5.1)
Sodium: 141 mmol/L (ref 135–145)
Total Bilirubin: 0.4 mg/dL (ref 0.0–1.2)
Total Protein: 7 g/dL (ref 6.5–8.1)

## 2024-03-12 LAB — CBC WITH DIFFERENTIAL (CANCER CENTER ONLY)
Abs Immature Granulocytes: 0.01 K/uL (ref 0.00–0.07)
Basophils Absolute: 0 K/uL (ref 0.0–0.1)
Basophils Relative: 0 %
Eosinophils Absolute: 0.2 K/uL (ref 0.0–0.5)
Eosinophils Relative: 2 %
HCT: 33.6 % — ABNORMAL LOW (ref 39.0–52.0)
Hemoglobin: 11.4 g/dL — ABNORMAL LOW (ref 13.0–17.0)
Immature Granulocytes: 0 %
Lymphocytes Relative: 17 %
Lymphs Abs: 1.2 K/uL (ref 0.7–4.0)
MCH: 29.7 pg (ref 26.0–34.0)
MCHC: 33.9 g/dL (ref 30.0–36.0)
MCV: 87.5 fL (ref 80.0–100.0)
Monocytes Absolute: 0.4 K/uL (ref 0.1–1.0)
Monocytes Relative: 5 %
Neutro Abs: 5.5 K/uL (ref 1.7–7.7)
Neutrophils Relative %: 76 %
Platelet Count: 162 K/uL (ref 150–400)
RBC: 3.84 MIL/uL — ABNORMAL LOW (ref 4.22–5.81)
RDW: 15 % (ref 11.5–15.5)
WBC Count: 7.3 K/uL (ref 4.0–10.5)
nRBC: 0 % (ref 0.0–0.2)

## 2024-03-13 LAB — TESTOSTERONE: Testosterone: <3 ng/dL — ABNORMAL LOW (ref 264–916)

## 2024-03-13 LAB — PROSTATE-SPECIFIC AG, SERUM (LABCORP): Prostate Specific Ag, Serum: <0.1 ng/mL (ref 0.0–4.0)

## 2024-03-14 ENCOUNTER — Other Ambulatory Visit: Admitting: Urology

## 2024-03-14 ENCOUNTER — Ambulatory Visit: Payer: Self-pay | Admitting: *Deleted

## 2024-03-14 NOTE — Telephone Encounter (Signed)
-----   Message from Norleen ONEIDA Kidney IV sent at 03/13/2024  9:47 AM EDT ----- Please let Matthew Colon know that his PSA remains undetectable. Please also let him know I have messaged his urologist in [place] and he should be contacted by their office soon. If he doesn't hear  from them by next week have him give us  a call back.   ----- Message ----- From: Rebecka, Lab In [place] Sent: 03/12/2024   8:41 AM EDT To: Norleen ONEIDA Kidney MADISON, MD

## 2024-03-14 NOTE — Telephone Encounter (Signed)
 TCT patient regarding recent lab results. Spoke with pt. Advised  that his PSA remains undetectable. Please also let him know I have messaged his urologist in [place] and he should be contacted by their office soon. If he doesn't hear  from them by next week have him give us  a call back. He voiced understanding. He has not heard from his urologist at time. Advised to call me next week if he has not heard from that office. He said he would call.

## 2024-03-28 ENCOUNTER — Other Ambulatory Visit: Payer: Self-pay | Admitting: Internal Medicine

## 2024-03-29 ENCOUNTER — Ambulatory Visit: Admitting: Physician Assistant

## 2024-03-29 NOTE — Progress Notes (Deleted)
 Chief Complaint: Follow up constipation and rectal pain  HPI:    [name] is a 68 year old African-American male with a past medical history as listed below including GERD, known to Dr. Shila, who returns to clinic today for follow-up of constipation and rectal pain.     09/30/2021 colonoscopy with moderate diverticulosis in the sigmoid descending and ascending colon, external and internal hemorrhoids and 1 less than 1 mm polyp in the transverse colon shown to be a tubular adenoma and repeat recommended in 7 years.    11/08/2022 patient seen in clinic and at that time discussed rectal pain/pressure for the past few months with radiation to prostate.  Also chronic reflux on Pantoprazole  20 daily.    12/15/2022 flex sigmoidoscopy with increased mucosa vascular pattern in the rectum, anal papula were hypertrophied, nonbleeding internal hemorrhoids and otherwise normal.  Referred to colorectal surgery for management of anal fistula.    02/11/2023 patient seen by general surgery for interrogation of anal fistula with fistulotomy, followed up on 03/09/2023 and was discharged from their clinic.    12/28/2023 patient seen in clinic and at that time discussed continued rectal pain.  On anoscopy there is grade 1 hemorrhoids with mild discomfort but no fissure.  At that time prescribe Movantik  25 mg daily.  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  Prescribed hydrocortisone  suppositories twice daily.  Discussed that if this did not help then we will need to further reevaluate with possible flex sigmoidoscopy or colonoscopy.    01/22/2024 CT of the abdomen pelvis with contrast given ongoing rectal pain and a history of fistula with no acute findings, no signs of perianal or perirectal abscess, thin linear soft tissue density structure extending from right posterior wall of the inferior margin of the anal sphincter inferiorly along the inner margin of the right gluteal crease, no  perceptible fluid or gas along the course of the tract, findings favored to represent a small perianal fistula..  Unchanged when compared with MRI from 11/16/2022.  Colonic diverticulosis without signs of acute diverticulitis.  At that time discussed seeing the surgeons again.    02/20/2024 patient seen by Dr. Bernarda Ned at [facility] surgical.  Anoscopy at that visit with no fistula, surgical site well-healed.  Noted to have pain to palpation over the left puborectalis more tense in nature when compared to the right.  He was referred to pelvic floor therapy.  This was in order to help relax his pelvic floor especially during bowel movements.  Past Medical History:  Diagnosis Date   Anal fistula    Arthritis    Foot pain, bilateral    referred to podiatry 06/2014   GERD (gastroesophageal reflux disease)    long history of   Hyperlipidemia    Hypertension    Polyarthralgia    shoulders, knees, ankles, wrists; neg rheum lab screen 04/2014   Prostate cancer (HCC)    Type 2 diabetes mellitus with diabetic neuropathy (HCC)    Wears glasses    Wears partial dentures upper     Past Surgical History:  Procedure Laterality Date   COLONOSCOPY     referral pending 03/2015   FISTULOTOMY N/A 02/11/2023   Procedure: INTERROGATION OF ANAL FISTULA WITH FISTULOTOMY;  Surgeon: Ned Bernarda, MD;  Location: [facility];  Service: General;  Laterality: N/A;   NO PAST SURGERIES     as of 03/2015    Current Outpatient Medications  Medication Sig Dispense Refill   alfuzosin  (UROXATRAL )  10 MG 24 hr tablet Take 1 tablet (10 mg total) by mouth daily with breakfast. 30 tablet 11   atorvastatin  (LIPITOR) 40 MG tablet TAKE 1 TABLET BY MOUTH EVERY DAY 90 tablet 0   calcium  carbonate (OS-CAL - DOSED IN MG OF ELEMENTAL CALCIUM ) 1250 (500 Ca) MG tablet Take 1 tablet by mouth daily with breakfast.     cholecalciferol (VITAMIN D3) 25 MCG (1000 UNIT) tablet Take 1,000 Units by mouth daily.     ferrous  sulfate 325 (65 FE) MG EC tablet Take 1 tablet (325 mg total) by mouth daily with breakfast. 30 tablet 6   fluticasone  (FLONASE ) 50 MCG/ACT nasal spray SPRAY 2 SPRAYS INTO EACH NOSTRIL EVERY DAY 48 mL 1   folic acid  (FOLVITE ) 1 MG tablet TAKE 1 TABLET BY MOUTH EVERY DAY 90 tablet 2   gabapentin  (NEURONTIN ) 600 MG tablet Take 1 tablet (600 mg total) by mouth 3 (three) times daily. Needs to keep appointment 11/15/2023 for additional refills. 270 tablet 1   hydrocortisone  (ANUSOL -HC) 25 MG suppository Place 1 suppository (25 mg total) rectally every 12 (twelve) hours. 14 suppository 2   meloxicam  (MOBIC ) 7.5 MG tablet Take 1 tablet (7.5 mg total) by mouth daily. 30 tablet 0   metFORMIN  (GLUCOPHAGE -XR) 500 MG 24 hr tablet TAKE 1 TABLET BY MOUTH EVERY DAY WITH BREAKFAST 90 tablet 1   naloxegol  oxalate (MOVANTIK ) 25 MG TABS tablet Take 1 tablet (25 mg total) by mouth daily. 30 tablet 3   olmesartan  (BENICAR ) 20 MG tablet TAKE 1 TABLET BY MOUTH EVERY DAY 90 tablet 1   ondansetron  (ZOFRAN ) 4 MG tablet Take 1 tablet (4 mg total) by mouth every 8 (eight) hours as needed for nausea or vomiting. 20 tablet 0   oxyCODONE  (OXY IR/ROXICODONE ) 5 MG immediate release tablet Take 1 tablet (5 mg total) by mouth every 6 (six) hours as needed for severe pain. 20 tablet 0   pantoprazole  (PROTONIX ) 20 MG tablet TAKE 1 TABLET BY MOUTH EVERY DAY 30 tablet 2   polyethylene glycol (MIRALAX / GLYCOLAX) 17 g packet Take by mouth.     senna (SENOKOT) 8.6 MG tablet Take 1 tablet (8.6 mg total) by mouth daily as needed for constipation. 30 tablet 3   tadalafil  (CIALIS ) 20 MG tablet Take 1 tablet (20 mg total) by mouth daily as needed for erectile dysfunction (take 45 minutes prior to sexual activity). 30 tablet 6   tirzepatide  (MOUNJARO ) 2.5 MG/0.5ML Pen Inject 2.5 mg into the skin once a week. 2 mL 0   traZODone  (DESYREL ) 50 MG tablet TAKE 1/2 TO 1 TABLET BY MOUTH AT BEDTIME AS NEEDED FOR SLEEP 90 tablet 1   No current  facility-administered medications for this visit.    Allergies as of 03/29/2024   (No Known Allergies)    Family History  Problem Relation Age of Onset   Diabetes Mother    Hypertension Mother    Other Father        murdered   Diabetes Sister    Arthritis Sister    Heart disease Sister    Diabetes Sister    Diabetes Sister    Cancer Neg Hx    Stroke Neg Hx    Prostate cancer Neg Hx        unknown   Bladder Cancer Neg Hx        unkown   Kidney cancer Neg Hx        unkown    Social History  Socioeconomic History   Marital status: Divorced    Spouse name: Not on file   Number of children: 3   Years of education: Not on file   Highest education level: Not on file  Occupational History   Occupation: switcher/ spotter/forklift  Tobacco Use   Smoking status: Former    Types: Cigarettes    Passive exposure: Current (1 1/2 mnth)   Smokeless tobacco: Never  Vaping Use   Vaping status: Never Used  Substance and Sexual Activity   Alcohol use: Not Currently    Comment: rare beer   Drug use: No   Sexual activity: Yes  Other Topics Concern   Not on file  Social History Narrative   Lives with his sister.   Works as a Engineer, civil (consulting) in [employer], water quality.   Exercise - active on the job.  Has 3 children in [place] .   Social Drivers of Health   Financial Resource Strain: Medium Risk (08/19/2023)   Overall Financial Resource Strain (CARDIA)    Difficulty of Paying Living Expenses: Somewhat hard  Food Insecurity: No Food Insecurity (08/19/2023)   Hunger Vital Sign    Worried About Running Out of Food in the Last Year: Never true    Ran Out of Food in the Last Year: Never true  Transportation Needs: No Transportation Needs (08/19/2023)   PRAPARE - Administrator, Civil Service (Medical): No    Lack of Transportation (Non-Medical): No  Physical Activity: Insufficiently Active (08/19/2023)   Exercise Vital Sign    Days of Exercise per Week: 2 days    Minutes of Exercise per  Session: 30 min  Stress: Stress Concern Present (08/19/2023)   [employer] of Occupational Health - Occupational Stress Questionnaire    Feeling of Stress : Rather much  Social Connections: Socially Isolated (08/19/2023)   Social Connection and Isolation Panel    Frequency of Communication with Friends and Family: Once a week    Frequency of Social Gatherings with Friends and Family: Never    Attends Religious Services: Never    Database administrator or Organizations: No    Attends Banker Meetings: Never    Marital Status: Divorced  Catering manager Violence: Not At Risk (05/17/2023)   Humiliation, Afraid, Rape, and Kick questionnaire    Fear of Current or Ex-Partner: No    Emotionally Abused: No    Physically Abused: No    Sexually Abused: No    Review of Systems:    Constitutional: No weight loss, fever, chills, weakness or fatigue HEENT: Eyes: No change in vision               Ears, Nose, Throat:  No change in hearing or congestion Skin: No rash or itching Cardiovascular: No chest pain, chest pressure or palpitations   Respiratory: No SOB or cough Gastrointestinal: See HPI and otherwise negative Genitourinary: No dysuria or change in urinary frequency Neurological: No headache, dizziness or syncope Musculoskeletal: No new muscle or joint pain Hematologic: No bleeding or bruising Psychiatric: No history of depression or anxiety    Physical Exam:  Vital signs: There were no vitals taken for this visit.  Constitutional:   Pleasant Caucasian male appears to be in NAD, Well developed, Well nourished, alert and cooperative Head:  Normocephalic and atraumatic. Eyes:   PEERL, EOMI. No icterus. Conjunctiva pink. Ears:  Normal auditory acuity. Neck:  Supple Throat: Oral cavity and pharynx without inflammation, swelling or lesion.  Respiratory: Respirations even and unlabored.  Lungs clear to auscultation bilaterally.   No wheezes, crackles, or rhonchi.   Cardiovascular: Normal S1, S2. No MRG. Regular rate and rhythm. No peripheral edema, cyanosis or pallor.  Gastrointestinal:  Soft, nondistended, nontender. No rebound or guarding. Normal bowel sounds. No appreciable masses or hepatomegaly. Rectal:  Not performed.  Msk:  Symmetrical without gross deformities. Without edema, no deformity or joint abnormality.  Neurologic:  Alert and  oriented x4;  grossly normal neurologically.  Skin:   Dry and intact without significant lesions or rashes. Psychiatric: Oriented to person, place and time. Demonstrates good judgement and reason without abnormal affect or behaviors.  RELEVANT LABS AND IMAGING: CBC    Component Value Date/Time   WBC 7.3 03/12/2024 0823   WBC 10.9 (H) 05/06/2018 0805   RBC 3.84 (L) 03/12/2024 0823   HGB 11.4 (L) 03/12/2024 0823   HGB 11.6 (L) 08/18/2023 1010   HCT 33.6 (L) 03/12/2024 0823   HCT 36.2 (L) 08/18/2023 1010   PLT 162 03/12/2024 0823   PLT 356 08/18/2023 1010   MCV 87.5 03/12/2024 0823   MCV 91 08/18/2023 1010   MCH 29.7 03/12/2024 0823   MCHC 33.9 03/12/2024 0823   RDW 15.0 03/12/2024 0823   RDW 13.2 08/18/2023 1010   LYMPHSABS 1.2 03/12/2024 0823   LYMPHSABS 1.2 08/18/2023 1010   MONOABS 0.4 03/12/2024 0823   EOSABS 0.2 03/12/2024 0823   EOSABS 0.3 08/18/2023 1010   BASOSABS 0.0 03/12/2024 0823   BASOSABS 0.0 08/18/2023 1010    CMP     Component Value Date/Time   NA 141 03/12/2024 0823   NA 142 08/18/2023 1010   K 4.2 03/12/2024 0823   CL 109 03/12/2024 0823   CO2 25 03/12/2024 0823   GLUCOSE 134 (H) 03/12/2024 0823   BUN 35 (H) 03/12/2024 0823   BUN 16 08/18/2023 1010   CREATININE 1.19 03/12/2024 0823   CREATININE 1.18 12/05/2015 0841   CALCIUM  10.0 03/12/2024 0823   PROT 7.0 03/12/2024 0823   PROT 6.6 08/18/2023 1010   ALBUMIN 4.0 03/12/2024 0823   ALBUMIN 4.2 08/18/2023 1010   AST 16 03/12/2024 0823   ALT 7 03/12/2024 0823   ALKPHOS 65 03/12/2024 0823   BILITOT 0.4 03/12/2024 0823    GFRNONAA >60 03/12/2024 0823   GFRAA >60 05/06/2018 0805    Assessment: 1.  Rectal pain: At this point has been going on for over a year, previously had a fistula which was taken care of by the surgical team with fistulotomy in August 2024, recent CT with possible small fistula, evaluated by the surgical team again but no signs of fistula on their exam, they thought this may be related to a tight pelvic floor and referred him to pelvic floor physical therapy 2.  Constipation:  Plan: 1. ***     Delon Failing, PA-C  Gastroenterology 03/29/2024, 8:18 AM  Cc: Vicci Barnie NOVAK, MD

## 2024-04-04 ENCOUNTER — Ambulatory Visit: Admitting: Urology

## 2024-04-04 DIAGNOSIS — C61 Malignant neoplasm of prostate: Secondary | ICD-10-CM | POA: Diagnosis not present

## 2024-04-04 DIAGNOSIS — N5314 Retrograde ejaculation: Secondary | ICD-10-CM | POA: Diagnosis not present

## 2024-04-04 DIAGNOSIS — R399 Unspecified symptoms and signs involving the genitourinary system: Secondary | ICD-10-CM

## 2024-04-04 NOTE — Progress Notes (Signed)
 Virtual Visit via Telephone Note  I connected with Matthew Dorey Sr. on 04/04/24 at  9:30 AM EDT by telephone and verified that I am speaking with the correct person using two identifiers.   Patient location: Home Provider location: [facility]   I discussed the limitations, risks, security and privacy concerns of performing an evaluation and management service by telephone and the availability of in person appointments. We discussed the impact of the COVID-19 pandemic on the healthcare system, and the importance of social distancing and reducing patient and provider exposure. I also discussed with the patient that there may be a patient responsible charge related to this service. The patient expressed understanding and agreed to proceed.  Reason for visit: Advanced prostate cancer, urinary symptoms, ejaculation concerns  History of Present Illness: 68 year old male who lives in [place], sees us  in [place] as [facility] does not take [facility] insurance, transportation is also an issue for him and we have done primarily virtual visits.  Presented with an elevated PSA of 17.6 in September 2022 and biopsy showed grade group 5 high-volume prostate cancer, PSMA PET scan suggested possible metastatic disease in the left pelvic nodes and left lower retroperitoneal nodes.  He was treated with curative intent with radiation to the prostate and nodes locally in [facility] in May 2023, as well as 2 years of ADT with final ADT injection given in February 2025.  PSA is undetectable from September 2025.  He has had urinary symptoms since radiation requiring Flomax  0.8 mg.  Did not see significant improvement on alfuzosin .  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  Was previously seen by Clotilda Cera he recommended cystoscopy to evaluate for urethral stricture but he no-showed that visit.  He was also treated with a course of antibiotics recently for unclear indications, most recent urine  culture from June was negative.  He does have some occasional burning and weak stream despite the Flomax .  We discussed cystoscopy as an option to evaluate for urethral stricture and he defers again.    We also discussed causes of retrograde ejaculation including Flomax , ADT, radiation.  Need to have realistic expectations.  He is able to have erections and orgasm.   Follow Up:  Continue Flomax  Keep follow-up as scheduled February 2026 for virtual visit PSA prior Would again recommend considering cystoscopy if worsening urinary symptoms to evaluate for urethral stricture   I discussed the assessment and treatment plan with the patient. The patient was provided an opportunity to ask questions and all were answered. The patient agreed with the plan and demonstrated an understanding of the instructions.   The patient was advised to call back or seek an in-person evaluation if the symptoms worsen or if the condition fails to improve as anticipated.  I provided 13 minutes of non-face-to-face time during this encounter.   Matthew JAYSON Burnet, MD

## 2024-04-06 ENCOUNTER — Other Ambulatory Visit: Payer: Self-pay | Admitting: Internal Medicine

## 2024-04-06 DIAGNOSIS — K219 Gastro-esophageal reflux disease without esophagitis: Secondary | ICD-10-CM

## 2024-04-16 ENCOUNTER — Ambulatory Visit: Attending: General Surgery | Admitting: Physical Therapy

## 2024-04-16 DIAGNOSIS — M62838 Other muscle spasm: Secondary | ICD-10-CM | POA: Diagnosis present

## 2024-04-16 DIAGNOSIS — M6281 Muscle weakness (generalized): Secondary | ICD-10-CM | POA: Insufficient documentation

## 2024-04-16 DIAGNOSIS — R279 Unspecified lack of coordination: Secondary | ICD-10-CM | POA: Insufficient documentation

## 2024-04-16 DIAGNOSIS — R293 Abnormal posture: Secondary | ICD-10-CM | POA: Diagnosis present

## 2024-04-16 NOTE — Patient Instructions (Signed)
 Bladder Irritants  Certain foods and beverages can be irritating to the bladder.  Avoiding these irritants may decrease your symptoms of urinary urgency, frequency or bladder pain.  Even reducing your intake can help with your symptoms.  Not everyone is sensitive to all bladder irritants, so you may consider focusing on one irritant at a time, removing or reducing your intake of that irritant for 7-10 days to see if this change helps your symptoms.  Water intake is also very important.  Below is a list of bladder irritants.  Drinks: alcohol, carbonated beverages, caffeinated beverages such as coffee and tea, drinks with artificial sweeteners, citrus juices, apple juice, tomato juice  Foods: tomatoes and tomato based foods, spicy food, sugar and artificial sweeteners, vinegar, chocolate, raw onion, apples, citrus fruits, pineapple, cranberries, tomatoes, strawberries, plums, peaches, cantaloupe  Other: acidic urine (too concentrated) - see water intake info below  Substitutes you can try that are NOT irritating to the bladder: cooked onion, pears, papayas, sun-brewed decaf teas, watermelons, non-citrus herbal teas, apricots, kava and low-acid instant drinks (Postum).    WATER INTAKE: Remember to drink lots of water (aim for fluid intake of half your body weight with 2/3 of fluids being water).  You may be limiting fluids due to fear of leakage, but this can actually worsen urgency symptoms due to highly concentrated urine.  Water helps balance the pH of your urine so it doesn't become too acidic - acidic urine is a bladder irritant!  Types of Fiber  There are two main types of fiber:  insoluble and soluble.  Both of these types can prevent and relieve constipation and diarrhea, although some people find one or the other to be more easily digested.  This handout details information about both types of fiber. recommended 25-35 grams of fiber per day,  average 9-12 grams per meal   key is a balance  between soluble and insoluble  Insoluble Fiber        Functions of Insoluble Fiber moves bulk through the intestines  controls and balances the pH (acidity) in the intestines   This type of fiber should be avoided or reduced if you have soft, frequent bowel movements or leakage      Benefits of Insoluble Fiber promotes regular bowel movement and prevents constipation  removes fecal waste through colon in less time  keeps an optimal pH in intestines to prevent microbes from producing cancer substances, therefore preventing colon cancer        Food Sources of Insoluble Fiber whole-wheat products  wheat bran "miller's bran" corn bran  flax seed or other seeds vegetables such as green beans, broccoli, cauliflower and potato skins  fruit skins and root vegetable skins  popcorn brown rice Kiwi (but is a combination of both)  Soluble Fiber( Types 5,6,7 if stools are looser)       Functions of Soluble Fiber  holds water in the colon to bulk and soften the stool prolongs stomach emptying time so that sugar is released and absorbed more slowly  prevent leakage associated with soft, frequent bowel movements.        Benefits of Soluble Fiber lowers total cholesterol and LDL cholesterol (the bad cholesterol) therefore reducing the risk of heart disease  regulates blood sugar for people with diabetes       Food Sources of Soluble Fiber oat/oat bran dried beans and peas  nuts  barley  flax seed or other seeds fruits such as oranges, pears, peaches, and apples  vegetables such as carrots  psyllium husk  prunes   5 mins daily while laying down, gentle circles shouldn't be painful, and move from right to left and repeat. Do 2x daily am/pm

## 2024-04-16 NOTE — Therapy (Signed)
 OUTPATIENT PHYSICAL THERAPY MALE PELVIC EVALUATION   Patient Name: Matthew Pelcher Sr. MRN: [id number] DOB:01/10/1956, 68 y.o., male Today's Date: 04/16/2024  END OF SESSION:  PT End of Session - 04/16/24 0947     Visit Number 1    Date for Recertification  07/17/24    Authorization Type [employer] medicare    Progress Note Due on Visit 10    PT Start Time 0945   arrival   PT Stop Time 1012    PT Time Calculation (min) 27 min    Activity Tolerance Patient tolerated treatment well;No increased pain    Behavior During Therapy [facility] for tasks assessed/performed          Past Medical History:  Diagnosis Date   Anal fistula    Arthritis    Foot pain, bilateral    referred to podiatry 06/2014   GERD (gastroesophageal reflux disease)    long history of   Hyperlipidemia    Hypertension    Polyarthralgia    shoulders, knees, ankles, wrists; neg rheum lab screen 04/2014   Prostate cancer (HCC)    Type 2 diabetes mellitus with diabetic neuropathy (HCC)    Wears glasses    Wears partial dentures upper    Past Surgical History:  Procedure Laterality Date   COLONOSCOPY     referral pending 03/2015   FISTULOTOMY N/A 02/11/2023   Procedure: INTERROGATION OF ANAL FISTULA WITH FISTULOTOMY;  Surgeon: Debby Hila, MD;  Location: [facility];  Service: General;  Laterality: N/A;   NO PAST SURGERIES     as of 03/2015   Patient Active Problem List   Diagnosis Date Noted   Prostate cancer (HCC) 02/25/2022   Drug-induced constipation 07/21/2021   Malignant neoplasm of prostate (HCC) 07/21/2021   Hyperlipidemia associated with type 2 diabetes mellitus (HCC) 11/13/2020   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   DDD (degenerative disc disease), cervical 09/21/2017   DJD of acromioclavicular joint (Right) 09/21/2017   DJD of acromioclavicular joint (Bilateral) 09/21/2017   DJD of acromioclavicular joint (Left) 09/21/2017   Osteoarthritis of glenohumeral joint (Left) 09/21/2017    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/2017   Tricompartmental disease of knee (Bilateral) 09/21/2017   Osteoarthritis of knees (Bilateral) 09/21/2017   Suprapatellar effusion of knee (recurrent) (Right) 09/21/2017   Calcaneal spur of feet (Bilateral) 09/21/2017   Hypomagnesemia 09/20/2017   Low testosterone  09/20/2017   Chronic foot pain (Bilateral) 09/20/2017   Vitamin D  deficiency 04/04/2017   Chronic shoulder pain (Primary Area of Pain) (Left) 03/30/2017   Chronic knee pain (Tertiary Area of Pain) (Left) 03/30/2017   Disorder of bone, unspecified 03/30/2017   Other reduced mobility 03/30/2017   Other specified health status 03/30/2017   Opioid use agreement exists 03/30/2017   Opiate use 03/30/2017   Chronic pain syndrome 03/30/2017   Chronic upper extremity pain (Secondary Area of Pain) (Bilateral) 03/30/2017   Elevated PSA 12/05/2015   Noncompliance 12/05/2015   Hypertension associated with diabetes (HCC) 03/14/2015   Type 2 diabetes mellitus with diabetic mononeuropathy (HCC) 03/14/2015   Encounter for health maintenance examination in adult 03/14/2015   Screening for prostate cancer 03/14/2015   Special screening for malignant neoplasms, colon 03/14/2015   Hyperlipidemia 03/14/2015   Nocturia 03/14/2015   Urinary frequency 03/14/2015   Need for prophylactic vaccination and inoculation against influenza 03/14/2015   Need for prophylactic vaccination against Streptococcus pneumoniae (pneumococcus) 03/14/2015   Need for Tdap vaccination 03/14/2015   Chronic nausea 03/14/2015   Gastroesophageal reflux disease without  esophagitis 03/14/2015    PCP: Vicci Barnie NOVAK, MD   REFERRING PROVIDER: Debby Hila, MD  REFERRING DIAG: [id number] (ICD-10-CM) - Other specified disorders of muscle  THERAPY DIAG:  Unspecified lack of coordination  Other muscle spasm  Abnormal posture  Muscle weakness (generalized)  Rationale for Evaluation and Treatment:  Rehabilitation  ONSET DATE: 2 years ago  SUBJECTIVE:                                                                                                                                                                                           SUBJECTIVE STATEMENT: complaints of rectal pain and pressure over the past few months which radiates to his prostate and worse after bowel movements. He underwent an MRI in May as well and this showed a simple right-sided posterior superficial anal fistula. He also underwent a flexible sigmoidoscopy which showed no acute findings to suggest an etiology for the rectal pain. Patient was noted to have a superficial fistula and a fistulotomy was performed on February 11, 2023. He states he has had quite a bit of constipation after surgery and still notes a pressure sensation for and some pain after bowel movements. He denies any bleeding. He denies any incontinence.  Started after completion of radiation 2 years ago due to prostate cancer. Did not have removed but did complete treatment.   Fluid intake: water - not much, energy drinks 2-3x(daily), soda sometimes and coffee sometimes  PAIN:  Are you having pain? Yes NPRS scale: 8/10 Pain location: low back radiating into bil mid thighs worse on right  Pain type: aching Pain description: constant   Aggravating factors: bowel movements (fullness and urge to go and throughout passing stool and for several hours after bowel movement), after sitting for several hours hurts when standing, walking (after a few steps), rolling over in bed  Relieving factors: medication  PRECAUTIONS: Other: history of prostate cancer  RED FLAGS: None   WEIGHT BEARING RESTRICTIONS: No  FALLS:  Has patient fallen in last 6 months? Yes. Number of falls 1 (2-3 weeks ago) on way to work and was feeling dizzy and tried to go sit down and fell before getting to chair. But had just started mounjaro    LIVING ENVIRONMENT: Lives with: lives  with their son Lives in: House/apartment Stairs: Yes: External: full flight steps; on one side Has following equipment at home: None  OCCUPATION:trucks for delivery   PLOF: Independent  PATIENT GOALS: to have less pain  PERTINENT HISTORY:  Hypertension, Polyarthralgia, FISTULOTOMY, Prostate cancer,  Drug-induced constipation, Hypomagnesemia, Chronic pain syndrome, Nocturia,  Sexual abuse: no   BOWEL MOVEMENT: Pain with  bowel movement: Yes Type of bowel movement:Type (Bristol Stool Scale) 1-2 without taking anything, 4-5 , Frequency if taking something for bowels will go more than 1x  but doesn't have bowel movement without taking, and Strain sometimes Fully empty rectum: Yes: sometimes Leakage: No Pads: No Fiber supplement: Yes: sometimes take softeners and/or laxatives   URINATION: Pain with urination: No but sometimes has burning Fully empty bladder: Yes: but takes a medication daily Stream: Strong and Weak Urgency: Yes:   Frequency: worse at night, 5-6x nightly; during the day 2 hours Leakage: sometimes with urgency  Pads: No  INTERCOURSE: Pain with intercourse: none Climax: yes but dry Ejaculation: feels like he does but dry   OBJECTIVE:  Note: Objective measures were completed at Evaluation unless otherwise noted.  DIAGNOSTIC FINDINGS:    PATIENT SURVEYS:    PFIQ-7 15  COGNITION: Overall cognitive status: Within functional limits for tasks assessed     SENSATION: Light touch: Appears intact  MUSCLE LENGTH: Bil hamstrings and and adductors limited by 50%  LUMBAR SPECIAL TESTS:  Straight leg raise test: abdominal bulging noted with this in supine and pain with Rt lef  FUNCTIONAL TESTS:  5 times sit to stand: 15s  GAIT: Distance walked: 250' Assistive device utilized: None Level of assistance: Complete Independence Comments: decreased cadence, decreased step height bil, decreased stride length, decreased arm swing, forward trunk  POSTURE:  rounded shoulders, forward head, and posterior pelvic tilt  PELVIC ALIGNMENT: WFL  LUMBARAROM/PROM:  A/PROM A/PROM  eval  Flexion limited by 25%  Extension WFL  Right lateral flexion limited by 25%  Left lateral flexion limited by 25%  Right rotation limited by 25%  Left rotation limited by 25%   (Blank rows = not tested)  LOWER EXTREMITY AROM/PROM:  Bil hamstrings, adductors, hip ER and IR limited by 25%  LOWER EXTREMITY MMT: Bil hips grossly 3+/5 (Rt abduction 3/5 with pain)  PALPATION: GENERAL TTP bil PSIS, Rt piriformis, Rt glute med and tightness noted throughout thoracic and lumbar paraspinals and Lt glute              External Perineal Exam to be assessed               Internal Pelvic Floor to be assessed  Patient confirms identification and approves PT to assess internal pelvic floor and treatment No  PELVIC MMT:   MMT eval  Internal Anal Sphincter   External Anal Sphincter   Puborectalis   Diastasis Recti   (Blank rows = not tested)  TONE: to be assessed   TODAY'S TREATMENT:                                                                                                                              DATE:   04/16/24 EVAL Examination completed, findings reviewed, pt educated on POC, HEP, and fiber handout, water intake, bladder irritants, abdominal massage, voiding mechanics, and step stool for bowel movement, pelvic floor  PT role in care, male pelvic floor anatomy. Pt motivated to participate in PT and agreeable to attempt recommendations.     PATIENT EDUCATION:  Education details: HEP, and fiber handout, water intake, bladder irritants, voiding mechanics, abdominal massage and step stool for bowel movement Person educated: Patient Education method: Explanation, Demonstration, Tactile cues, Verbal cues, and Handouts Education comprehension: verbalized understanding, returned demonstration, verbal cues required, tactile cues required, and needs further  education  HOME EXERCISE PROGRAM: To be given   ASSESSMENT:  CLINICAL IMPRESSION: Patient is a 68 y.o. male  who was seen today for physical therapy evaluation and treatment for constipation, pelvic/back/hip pain, increased urinary urgency, frequency, and urinary incontinence. Pt has history of prostate cancer with radiation 2 years ago and pain has been present since then. Reports he does take pain medication daily and has constipation with this as well.  Pt arrived late to appointment and did have several questions, all answered and due to time constraints and pt benefiting from education on pelvic floor PT and anatomy, internal not completed today. Will at future appointment if needed and pt consents. Pt demonstrated decreased flexibility in spine and hips, decreased core and hip strength, pain at back,hips, gluteals but all worse on right side. Pt educated on HEP, and fiber handout, water intake, bladder irritants, voiding mechanics, abdominal massage, and step stool for bowel movements. Pt denied questions at end of session. Pt would benefit from additional PT to further address deficits.    OBJECTIVE IMPAIRMENTS: decreased activity tolerance, decreased coordination, decreased endurance, decreased knowledge of condition, decreased mobility, difficulty walking, decreased ROM, decreased strength, increased fascial restrictions, increased muscle spasms, impaired flexibility, improper body mechanics, postural dysfunction, and pain.   ACTIVITY LIMITATIONS: carrying, lifting, bending, sitting, standing, squatting, sleeping, stairs, transfers, continence, and locomotion level  PARTICIPATION LIMITATIONS: interpersonal relationship, community activity, occupation, and yard work  PERSONAL FACTORS: Time since onset of injury/illness/exacerbation and 1 comorbidity: medical history are also affecting patient's functional outcome.   REHAB POTENTIAL: Good  CLINICAL DECISION MAKING:  Stable/uncomplicated  EVALUATION COMPLEXITY: Low   GOALS: Goals reviewed with patient? Yes  SHORT TERM GOALS: Target date: 05/14/24  Pt to be I with HEP for carry over and continuing recommendations for improved outcomes.   Baseline: Goal status: INITIAL  2.  Pt will be independent with use of squatty potty, relaxed toileting mechanics, and improved bowel movement techniques in order to increase ease of bowel movements and complete evacuation.   Baseline:  Goal status: INITIAL  3.  Pt will be independent with the knack, urge suppression technique, and double voiding in order to improve bladder habits and decrease urinary incontinence.   Baseline:  Goal status: INITIAL  4.  Pt to demonstrate improved lumbopelvic mobility with <25% restrictions to improve tolerance to standing at least 30 mins. Baseline:  Goal status: INITIAL   LONG TERM GOALS: Target date: 07/17/24  Pt to be I with advanced HEP for carry over and continuing recommendations for improved outcomes.   Baseline:  Goal status: INITIAL  2.  Pt will report her BMs are complete at least 75% of the time due to improved bowel habits and evacuation techniques for improved bowel health. Baseline:  Goal status: INITIAL  3.  Pt to report ability to complete full day at work without worsening pain to better attend work daily. Baseline:  Goal status: INITIAL  4.  Pt to demonstrate improved gait mechanics without compensation for at least 1000' for improved tolerance to walking to/from trucks  at work.  Baseline:  Goal status: INITIAL  5.  Pt to demonstrate x10 10# squats without compensation for improved pelvic stability to tolerate carrying in groceries.  Baseline:  Goal status: INITIAL  6.  Pt to report no urinary incontinence for at least one week for improved confidence in making to the bathroom while in community.  Baseline:  Goal status: INITIAL   PLAN:  PT FREQUENCY: 1x/week  PT DURATION: 12  sessions  PLANNED INTERVENTIONS: 97110-Therapeutic exercises, 97530- Therapeutic activity, [id number]- Neuromuscular re-education, [phone]- Self Care, 02859- Manual therapy, [phone]- Gait training, [phone]- Canalith repositioning, [id number]- Aquatic Therapy, [phone]- Electrical stimulation (manual), [id number]- Vasopneumatic device, [phone] (1-2 muscles), [id number] (3+ muscles)- Dry Needling, Patient/Family education, Taping, Joint mobilization, Spinal mobilization, Scar mobilization, Vestibular training, DME instructions, Cryotherapy, Moist heat, and Biofeedback  PLAN FOR NEXT SESSION: internal if consents, HEP, back and hip mobility/stretches, pelvic floor downtraining if needed, breathing and voiding mechanics    Darryle Navy, PT, DPT 10/13/259:48 AM  [facility] [address] [place], [place] [address] Phone # [phone] Fax [phone]

## 2024-04-23 ENCOUNTER — Ambulatory Visit: Admitting: Physical Medicine and Rehabilitation

## 2024-04-23 ENCOUNTER — Other Ambulatory Visit (INDEPENDENT_AMBULATORY_CARE_PROVIDER_SITE_OTHER)

## 2024-04-23 ENCOUNTER — Encounter: Payer: Self-pay | Admitting: Physical Medicine and Rehabilitation

## 2024-04-23 DIAGNOSIS — M47812 Spondylosis without myelopathy or radiculopathy, cervical region: Secondary | ICD-10-CM | POA: Diagnosis not present

## 2024-04-23 DIAGNOSIS — M542 Cervicalgia: Secondary | ICD-10-CM

## 2024-04-23 NOTE — Progress Notes (Unsigned)
 Pain Scale   Average Pain 8  Patient has had neck pain for years. He drives a fork lift at work, and says that he often times will drive with his head and neck turned backwards. His pain is primarily on the right side of the neck, and he does have numbness and tingling down both arms and into both hands. He currently takes Oxycodone  for his knees, and will occasionally take Ibuprofen , as well. He denies use of other home treatments including ice and heat.

## 2024-04-23 NOTE — Progress Notes (Unsigned)
 Core Outcome Measures Index (COMI) Back Score  Average Pain 8  COMI Score 80 %

## 2024-04-23 NOTE — Progress Notes (Unsigned)
 Matthew Gentzler Sr. - 68 y.o. male MRN [id number]  Date of birth: 07-23-1955  Office Visit Note: Visit Date: 04/23/2024 PCP: Vicci Barnie NOVAK, MD Referred by: Vicci Barnie NOVAK, MD  Subjective: Chief Complaint  Patient presents with   Neck - Pain   HPI: Matthew Mccoin Sr. is a 68 y.o. male who comes in today as a self referral for evaluation of chronic, worsening and severe right sided neck pain. Pain ongoing for several years. His pain worsens with side to side rotation of his neck. He describes as sore and stabbing sensation. States he hears popping and cracking sensation when turning neck, currently rates as 8 out of 10. Some relief of pain with home exercise regimen, rest and use of medications. He is currently undergoing chronic pain management with Vernell Pass, NP at [facility] for more chronic shoulder and knee issues. He is prescribed 180 tablets of 10-325 mg Percocet monthly. No recent imaging of cervical spine. No history of cervical surgery/injections. Patient denies focal weakness, numbness and tingling. No recent trauma or falls.   He is currently working as Estate agent and Naval architect. Patients course is complicated by diabetes mellitus and prostate cancer.      Review of Systems  Musculoskeletal:  Positive for myalgias and neck pain.  Neurological:  Negative for tingling, sensory change, focal weakness and weakness.  All other systems reviewed and are negative.  Otherwise per HPI.  Assessment & Plan: Visit Diagnoses:    ICD-10-CM   1. Neck pain  M54.2 XR Cervical Spine 2 or 3 views    MR CERVICAL SPINE WO CONTRAST    2. Cervicalgia  M54.2 MR CERVICAL SPINE WO CONTRAST    3. Facet arthropathy, cervical  M47.812 MR CERVICAL SPINE WO CONTRAST    4. Spondylosis without myelopathy or radiculopathy, cervical region  M47.812 MR CERVICAL SPINE WO CONTRAST       Plan: Findings:  Chronic, worsening and severe right sided neck pain. No radicular pain  down the arms. Patient continues to have severe pain despite good conservative therapies such as home exercise regimen, rest and use of medications. I obtained cervical radiographs in the office today that show multi level degenerative changes, disc height loss and facet arthropathy. I do think his arthritis is severe. Patients clinical presentation and exam are consistent with facet mediated pain. He does have pain with side to side rotation of his neck. We discussed treatment plan in detail today. Next step is to obtain cervical MRI imaging. Depending on results of cervical MRI we discussed possibility of performing facet joint injections. Patient has no questions at this time. He can continue chronic pain management with [facility]. No red flag symptoms noted upon exam today.     Meds & Orders: No orders of the defined types were placed in this encounter.   Orders Placed This Encounter  Procedures   XR Cervical Spine 2 or 3 views   MR CERVICAL SPINE WO CONTRAST    Follow-up: Return for Cervical MRI review.   Procedures: No procedures performed      Clinical History: No specialty comments available.   He reports that he has quit smoking. His smoking use included cigarettes. He has been exposed to tobacco smoke. He has never used smokeless tobacco.  Recent Labs    11/15/23 0934  HGBA1C 5.6    Objective:  VS:  HT:    WT:   BMI:     BP:   HR:  bpm  TEMP: ( )  RESP:  Physical Exam Vitals and nursing note reviewed.  HENT:     Head: Normocephalic and atraumatic.     Right Ear: External ear normal.     Left Ear: External ear normal.     Nose: Nose normal.     Mouth/Throat:     Mouth: Mucous membranes are moist.  Eyes:     Extraocular Movements: Extraocular movements intact.  Cardiovascular:     Rate and Rhythm: Normal rate.     Pulses: Normal pulses.  Pulmonary:     Effort: Pulmonary effort is normal.  Abdominal:     General: Abdomen is flat. There is no  distension.  Musculoskeletal:        General: Tenderness present.     Cervical back: Tenderness present.     Comments: Discomfort noted with side to side rotation. Patient has good strength in the upper extremities including 5 out of 5 strength in wrist extension, long finger flexion and APB. Shoulder range of motion is full bilaterally without any sign of impingement. There is no atrophy of the hands intrinsically. Sensation intact bilaterally. Negative Hoffman's sign. Negative Spurling's sign.     Skin:    General: Skin is warm and dry.     Capillary Refill: Capillary refill takes less than 2 seconds.  Neurological:     General: No focal deficit present.     Mental Status: He is alert and oriented to person, place, and time.  Psychiatric:        Mood and Affect: Mood normal.        Behavior: Behavior normal.     Ortho Exam  Imaging: XR Cervical Spine 2 or 3 views Result Date: 04/23/2024 AP and lateral radiographs of cervical spine show loss of lordosis. There are multi level degenerative changes including disc height loss, facet arthropathy, biforaminal narrowing and anterior osteophyte formation. There is retrolisthesis of C5-C6.    Past Medical/Family/Surgical/Social History: Medications & Allergies reviewed per EMR, new medications updated. Patient Active Problem List   Diagnosis Date Noted   Prostate cancer (HCC) 02/25/2022   Drug-induced constipation 07/21/2021   Malignant neoplasm of prostate (HCC) 07/21/2021   Hyperlipidemia associated with type 2 diabetes mellitus (HCC) 11/13/2020   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   DDD (degenerative disc disease), cervical 09/21/2017   DJD of acromioclavicular joint (Right) 09/21/2017   DJD of acromioclavicular joint (Bilateral) 09/21/2017   DJD of acromioclavicular joint (Left) 09/21/2017   Osteoarthritis of glenohumeral joint (Left) 09/21/2017   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/2017   Tricompartmental disease of  knee (Bilateral) 09/21/2017   Osteoarthritis of knees (Bilateral) 09/21/2017   Suprapatellar effusion of knee (recurrent) (Right) 09/21/2017   Calcaneal spur of feet (Bilateral) 09/21/2017   Hypomagnesemia 09/20/2017   Low testosterone  09/20/2017   Chronic foot pain (Bilateral) 09/20/2017   Vitamin D  deficiency 04/04/2017   Chronic shoulder pain (Primary Area of Pain) (Left) 03/30/2017   Chronic knee pain (Tertiary Area of Pain) (Left) 03/30/2017   Disorder of bone, unspecified 03/30/2017   Other reduced mobility 03/30/2017   Other specified health status 03/30/2017   Opioid use agreement exists 03/30/2017   Opiate use 03/30/2017   Chronic pain syndrome 03/30/2017   Chronic upper extremity pain (Secondary Area of Pain) (Bilateral) 03/30/2017   Elevated PSA 12/05/2015   Noncompliance 12/05/2015   Hypertension associated with diabetes (HCC) 03/14/2015   Type 2 diabetes mellitus with diabetic mononeuropathy (HCC) 03/14/2015   Encounter for health maintenance examination  in adult 03/14/2015   Screening for prostate cancer 03/14/2015   Special screening for malignant neoplasms, colon 03/14/2015   Hyperlipidemia 03/14/2015   Nocturia 03/14/2015   Urinary frequency 03/14/2015   Need for prophylactic vaccination and inoculation against influenza 03/14/2015   Need for prophylactic vaccination against Streptococcus pneumoniae (pneumococcus) 03/14/2015   Need for Tdap vaccination 03/14/2015   Chronic nausea 03/14/2015   Gastroesophageal reflux disease without esophagitis 03/14/2015   Past Medical History:  Diagnosis Date   Anal fistula    Arthritis    Foot pain, bilateral    referred to podiatry 06/2014   GERD (gastroesophageal reflux disease)    long history of   Hyperlipidemia    Hypertension    Polyarthralgia    shoulders, knees, ankles, wrists; neg rheum lab screen 04/2014   Prostate cancer (HCC)    Type 2 diabetes mellitus with diabetic neuropathy (HCC)    Wears glasses     Wears partial dentures upper    Family History  Problem Relation Age of Onset   Diabetes Mother    Hypertension Mother    Other Father        murdered   Diabetes Sister    Arthritis Sister    Heart disease Sister    Diabetes Sister    Diabetes Sister    Cancer Neg Hx    Stroke Neg Hx    Prostate cancer Neg Hx        unknown   Bladder Cancer Neg Hx        unkown   Kidney cancer Neg Hx        unkown   Past Surgical History:  Procedure Laterality Date   COLONOSCOPY     referral pending 03/2015   FISTULOTOMY N/A 02/11/2023   Procedure: INTERROGATION OF ANAL FISTULA WITH FISTULOTOMY;  Surgeon: Debby Hila, MD;  Location: [facility];  Service: General;  Laterality: N/A;   NO PAST SURGERIES     as of 03/2015   Social History   Occupational History   Occupation: Editor, commissioning spotter/forklift  Tobacco Use   Smoking status: Former    Types: Cigarettes    Passive exposure: Current (1 1/2 mnth)   Smokeless tobacco: Never  Vaping Use   Vaping status: Never Used  Substance and Sexual Activity   Alcohol use: Not Currently    Comment: rare beer   Drug use: No   Sexual activity: Yes

## 2024-05-01 ENCOUNTER — Other Ambulatory Visit: Payer: Self-pay | Admitting: Internal Medicine

## 2024-05-01 DIAGNOSIS — E1142 Type 2 diabetes mellitus with diabetic polyneuropathy: Secondary | ICD-10-CM

## 2024-05-02 ENCOUNTER — Ambulatory Visit: Admitting: Physical Therapy

## 2024-05-03 ENCOUNTER — Encounter: Payer: Self-pay | Admitting: Hematology and Oncology

## 2024-05-07 ENCOUNTER — Encounter: Payer: Self-pay | Admitting: Radiology

## 2024-05-10 ENCOUNTER — Ambulatory Visit
Admission: RE | Admit: 2024-05-10 | Discharge: 2024-05-10 | Disposition: A | Discharge status: Home / Self Care | Source: Ambulatory Visit | Attending: Physical Medicine and Rehabilitation | Admitting: Physical Medicine and Rehabilitation

## 2024-05-11 ENCOUNTER — Ambulatory Visit: Admitting: Podiatry

## 2024-05-17 ENCOUNTER — Ambulatory Visit: Admitting: Internal Medicine

## 2024-05-18 ENCOUNTER — Ambulatory Visit: Admitting: Podiatry

## 2024-05-18 ENCOUNTER — Encounter: Admitting: Physical Therapy

## 2024-05-21 ENCOUNTER — Ambulatory Visit: Admitting: Physical Therapy

## 2024-05-21 ENCOUNTER — Ambulatory Visit: Admitting: Internal Medicine

## 2024-05-21 ENCOUNTER — Telehealth: Payer: Self-pay | Admitting: Physical Therapy

## 2024-05-21 NOTE — Telephone Encounter (Signed)
 Pt informed of missed appt this morning and reminded of next scheduled session. Pt informed of attendance policy and was informed that if he cancels or no-shows again, we will schedule him on an individual-appt basis.  Celena Domino, PT, DPT 05/21/24 8:30 AM [facility] [address] [place], [place] [address] Phone # [phone] Fax [phone]

## 2024-05-22 ENCOUNTER — Ambulatory Visit: Attending: Internal Medicine

## 2024-05-22 ENCOUNTER — Ambulatory Visit: Payer: Medicare Other

## 2024-05-22 VITALS — Ht 71.0 in | Wt 243.0 lb

## 2024-05-22 DIAGNOSIS — Z0000 Encounter for general adult medical examination without abnormal findings: Secondary | ICD-10-CM

## 2024-05-22 NOTE — Progress Notes (Signed)
 I connected with  Matthew Poffenberger Sr. on 05/22/24 by a audio enabled telemedicine application and verified that I am speaking with the correct person using two identifiers.  Patient Location: Home  Provider Location: Office/Clinic  Persons Participating in Visit: Patient.  I discussed the limitations of evaluation and management by telemedicine. The patient expressed understanding and agreed to proceed.   Vital Signs: Because this visit was a virtual/telehealth visit, some criteria may be missing or patient reported. Any vitals not documented were not able to be obtained and vitals that have been documented are patient reported.     Chief Complaint  Patient presents with   Medicare Wellness    SUBSEQUENT     Subjective:   Matthew Sudberry Sr. is a 68 y.o. male who presents for a Medicare Annual Wellness Visit.  Allergies (verified) Patient has no known allergies.   History: Past Medical History:  Diagnosis Date   Anal fistula    Arthritis    Foot pain, bilateral    referred to podiatry 06/2014   GERD (gastroesophageal reflux disease)    long history of   Hyperlipidemia    Hypertension    Polyarthralgia    shoulders, knees, ankles, wrists; neg rheum lab screen 04/2014   Prostate cancer (HCC)    Type 2 diabetes mellitus with diabetic neuropathy (HCC)    Wears glasses    Wears partial dentures upper    Past Surgical History:  Procedure Laterality Date   COLONOSCOPY     referral pending 03/2015   FISTULOTOMY N/A 02/11/2023   Procedure: INTERROGATION OF ANAL FISTULA WITH FISTULOTOMY;  Surgeon: Debby Hila, MD;  Location: [facility];  Service: General;  Laterality: N/A;   NO PAST SURGERIES     as of 03/2015   Family History  Problem Relation Age of Onset   Diabetes Mother    Hypertension Mother    Other Father        murdered   Diabetes Sister    Arthritis Sister    Heart disease Sister    Diabetes Sister    Diabetes Sister    Cancer Neg Hx     Stroke Neg Hx    Prostate cancer Neg Hx        unknown   Bladder Cancer Neg Hx        unkown   Kidney cancer Neg Hx        unkown   Social History   Occupational History   Occupation: higher education careers adviser  Tobacco Use   Smoking status: Former    Types: Cigarettes    Passive exposure: Current (1 1/2 mnth)   Smokeless tobacco: Never  Vaping Use   Vaping status: Never Used  Substance and Sexual Activity   Alcohol use: Not Currently    Comment: rare beer   Drug use: No   Sexual activity: Yes   Tobacco Counseling Counseling given: Not Answered  SDOH Screenings   Food Insecurity: No Food Insecurity (05/22/2024)  Housing: High Risk (05/22/2024)  Transportation Needs: No Transportation Needs (05/22/2024)  Utilities: Not At Risk (05/22/2024)  Alcohol Screen: Low Risk  (08/19/2023)  Depression (PHQ2-9): Medium Risk (05/22/2024)  Financial Resource Strain: Medium Risk (08/19/2023)  Physical Activity: Sufficiently Active (05/22/2024)  Social Connections: Socially Isolated (05/22/2024)  Stress: Stress Concern Present (05/22/2024)  Tobacco Use: Medium Risk (05/22/2024)  Health Literacy: Adequate Health Literacy (05/22/2024)   See flowsheets for full screening details  Depression Screen PHQ 2 & 9 Depression Scale-  Over the past 2 weeks, how often have you been bothered by any of the following problems? Little interest or pleasure in doing things: 0 Feeling down, depressed, or hopeless (PHQ Adolescent also includes...irritable): 1 PHQ-2 Total Score: 1 Trouble falling or staying asleep, or sleeping too much: 3 (INSOMNIA) Feeling tired or having little energy: 3 Poor appetite or overeating (PHQ Adolescent also includes...weight loss): 1 Feeling bad about yourself - or that you are a failure or have let yourself or your family down: 1 (REGARDING HEALTH) Trouble concentrating on things, such as reading the newspaper or watching television (PHQ Adolescent also includes...like  school work): 1 Moving or speaking so slowly that other people could have noticed. Or the opposite - being so fidgety or restless that you have been moving around a lot more than usual: 0 Thoughts that you would be better off dead, or of hurting yourself in some way: 0 PHQ-9 Total Score: 10 If you checked off any problems, how difficult have these problems made it for you to do your work, take care of things at home, or get along with other people?: Not difficult at all     Goals Addressed             This Visit's Progress    Client understands the importance of follow-up with providers by attending scheduled visits         Visit info / Clinical Intake: Medicare Wellness Visit Type:: Subsequent Annual Wellness Visit Persons participating in visit:: patient Medicare Wellness Visit Mode:: Telephone If telephone:: video declined Because this visit was a virtual/telehealth visit:: pt reported vitals If Telephone or Video please confirm:: I discussed the limitations of evaluation and management by telemedicine; I connected with the patient using audio enabled telemedicine application and verified that I am speaking with the correct person using two identifiers; The patient expressed understanding and agreed to proceed Patient Location:: HOME Provider Location:: OFFICE Information given by:: patient Interpreter Needed?: No Pre-visit prep was completed: yes AWV questionnaire completed by patient prior to visit?: no Living arrangements:: with family/others Patient's Overall Health Status Rating: (!) fair Typical amount of pain: (!) a lot Does pain affect daily life?: (!) yes Are you currently prescribed opioids?: (!) yes  Dietary Habits and Nutritional Risks How many meals a day?: 2 Eats fruit and vegetables daily?: (!) no Most meals are obtained by: preparing own meals In the last 2 weeks, have you had any of the following?: none Diabetic:: (!) yes Any non-healing wounds?: no How  often do you check your BS?: as needed (EVERY OTHER DAY) Would you like to be referred to a Nutritionist or for Diabetic Management? : no  Functional Status Activities of Daily Living (to include ambulation/medication): Independent Ambulation: Independent with device- listed below Home Assistive Devices/Equipment: Eyeglasses Medication Administration: Independent Home Management: Independent Manage your own finances?: yes Primary transportation is: driving Concerns about vision?: no *vision screening is required for WTM* Concerns about hearing?: no  Fall Screening Falls in the past year?: 1 Number of falls in past year: 1 Was there an injury with Fall?: 0 Fall Risk Category Calculator: 2 Patient Fall Risk Level: Moderate Fall Risk  Fall Risk Patient at Risk for Falls Due to: History of fall(s) Fall risk Follow up: Falls evaluation completed; Education provided  Home and Transportation Safety: All rugs have non-skid backing?: N/A, no rugs All stairs or steps have railings?: yes Grab bars in the bathtub or shower?: yes Have non-skid surface in bathtub or shower?:  yes Good home lighting?: yes Regular seat belt use?: yes Hospital stays in the last year:: no  Cognitive Assessment Difficulty concentrating, remembering, or making decisions? : yes Will 6CIT or Mini Cog be Completed: no 6CIT or Mini Cog Declined: patient alert, oriented, able to answer questions appropriately and recall recent events  Advance Directives (For Healthcare) Does Patient Have a Medical Advance Directive?: No Would patient like information on creating a medical advance directive?: No - Patient declined  Reviewed/Updated  Reviewed/Updated: Reviewed All (Medical, Surgical, Family, Medications, Allergies, Care Teams, Patient Goals)        Objective:    Today's Vitals   05/22/24 0839  Weight: 243 lb (110.2 kg)  Height: 5' 11 (1.803 m)  PainSc: 0-No pain   Body mass index is 33.89  kg/m.  Current Medications (verified) Outpatient Encounter Medications as of 05/22/2024  Medication Sig   alfuzosin  (UROXATRAL ) 10 MG 24 hr tablet Take 1 tablet (10 mg total) by mouth daily with breakfast.   atorvastatin  (LIPITOR) 40 MG tablet TAKE 1 TABLET BY MOUTH EVERY DAY   calcium  carbonate (OS-CAL - DOSED IN MG OF ELEMENTAL CALCIUM ) 1250 (500 Ca) MG tablet Take 1 tablet by mouth daily with breakfast.   cholecalciferol (VITAMIN D3) 25 MCG (1000 UNIT) tablet Take 1,000 Units by mouth daily.   ferrous sulfate  325 (65 FE) MG EC tablet Take 1 tablet (325 mg total) by mouth daily with breakfast.   fluticasone  (FLONASE ) 50 MCG/ACT nasal spray SPRAY 2 SPRAYS INTO EACH NOSTRIL EVERY DAY   folic acid  (FOLVITE ) 1 MG tablet TAKE 1 TABLET BY MOUTH EVERY DAY   gabapentin  (NEURONTIN ) 600 MG tablet Take 1 tablet (600 mg total) by mouth 3 (three) times daily. Needs to keep appointment 11/15/2023 for additional refills.   hydrocortisone  (ANUSOL -HC) 25 MG suppository Place 1 suppository (25 mg total) rectally every 12 (twelve) hours.   meloxicam  (MOBIC ) 7.5 MG tablet Take 1 tablet (7.5 mg total) by mouth daily.   metFORMIN  (GLUCOPHAGE -XR) 500 MG 24 hr tablet TAKE 1 TABLET BY MOUTH EVERY DAY WITH BREAKFAST   naloxegol  oxalate (MOVANTIK ) 25 MG TABS tablet Take 1 tablet (25 mg total) by mouth daily.   olmesartan  (BENICAR ) 20 MG tablet TAKE 1 TABLET BY MOUTH EVERY DAY   ondansetron  (ZOFRAN ) 4 MG tablet Take 1 tablet (4 mg total) by mouth every 8 (eight) hours as needed for nausea or vomiting.   oxyCODONE  (OXY IR/ROXICODONE ) 5 MG immediate release tablet Take 1 tablet (5 mg total) by mouth every 6 (six) hours as needed for severe pain.   pantoprazole  (PROTONIX ) 20 MG tablet TAKE 1 TABLET BY MOUTH EVERY DAY   polyethylene glycol (MIRALAX / GLYCOLAX) 17 g packet Take by mouth.   senna (SENOKOT) 8.6 MG tablet Take 1 tablet (8.6 mg total) by mouth daily as needed for constipation.   tadalafil  (CIALIS ) 20 MG tablet  Take 1 tablet (20 mg total) by mouth daily as needed for erectile dysfunction (take 45 minutes prior to sexual activity).   tirzepatide  (MOUNJARO ) 2.5 MG/0.5ML Pen Inject 2.5 mg into the skin once a week.   traZODone  (DESYREL ) 50 MG tablet TAKE 1/2 TO 1 TABLET BY MOUTH AT BEDTIME AS NEEDED FOR SLEEP   No facility-administered encounter medications on file as of 05/22/2024.   Hearing/Vision screen Hearing Screening - Comments:: Denies hearing difficulties.  Vision Screening - Comments:: Wears rx glasses - up to date with routine eye exams with [facility]. Immunizations and Health Maintenance Health Maintenance  Topic Date Due   Hepatitis C Screening  Never done   OPHTHALMOLOGY EXAM  03/13/2016   Influenza Vaccine  02/03/2024   COVID-19 Vaccine (5 - 2025-26 season) 03/05/2024   HEMOGLOBIN A1C  05/17/2024   FOOT EXAM  11/14/2024   Diabetic kidney evaluation - Urine ACR  01/30/2025   Diabetic kidney evaluation - eGFR measurement  03/12/2025   Medicare Annual Wellness (AWV)  05/22/2025   DTaP/Tdap/Td (3 - Td or Tdap) 07/17/2028   Colonoscopy  09/30/2028   Pneumococcal Vaccine: 50+ Years  Completed   Zoster Vaccines- Shingrix  Completed   Meningococcal B Vaccine  Aged Out   Hepatitis B Vaccines 19-59 Average Risk  Discontinued        Assessment/Plan:  This is a routine wellness examination for [place].  Patient Care Team: Vicci Barnie NOVAK, MD as PCP - General (Internal Medicine) Patrcia Cough, MD as Consulting Physician (Radiation Oncology) Francisca Redell BROCKS, MD as Consulting Physician (Urology) Starla Wendelyn BIRCH, RN as Registered Nurse Federico Norleen ONEIDA CLORE, MD as Consulting Physician (Hematology and Oncology) [facility] as Consulting Physician (Retina Ophthalmology)  I have personally reviewed and noted the following in the patient's chart:   Medical and social history Use of alcohol, tobacco or illicit drugs  Current medications and  supplements including opioid prescriptions. Functional ability and status Nutritional status Physical activity Advanced directives List of other physicians Hospitalizations, surgeries, and ER visits in previous 12 months Vitals Screenings to include cognitive, depression, and falls Referrals and appointments  No orders of the defined types were placed in this encounter.  In addition, I have reviewed and discussed with patient certain preventive protocols, quality metrics, and best practice recommendations. A written personalized care plan for preventive services as well as general preventive health recommendations were provided to patient.   Roz LOISE Fuller, LPN   [id number]   Return in about 1 year (around 05/22/2025) for Medicare wellness.  After Visit Summary: (MyChart) Due to this being a telephonic visit, the after visit summary with patients personalized plan was offered to patient via MyChart   Nurse Notes: Patient is due for vaccines and diabetic lab work.  Fax sent to [facility] to get copy of last eye exam.

## 2024-05-22 NOTE — Patient Instructions (Signed)
 [name],  Thank you for taking the time for your Medicare Wellness Visit. I appreciate your continued commitment to your health goals. Please review the care plan we discussed, and feel free to reach out if I can assist you further.  Please note that Annual Wellness Visits do not include a physical exam. Some assessments may be limited, especially if the visit was conducted virtually. If needed, we may recommend an in-person follow-up with your provider.  Ongoing Care Seeing your primary care provider every 3 to 6 months helps us  monitor your health and provide consistent, personalized care.   Referrals If a referral was made during today's visit and you haven't received any updates within two weeks, please contact the referred provider directly to check on the status.  Recommended Screenings:  Health Maintenance  Topic Date Due   Hepatitis C Screening  Never done   Eye exam for diabetics  03/13/2016   Flu Shot  02/03/2024   COVID-19 Vaccine (5 - 2025-26 season) 03/05/2024   Medicare Annual Wellness Visit  05/16/2024   Hemoglobin A1C  05/17/2024   Complete foot exam   11/14/2024   Yearly kidney health urinalysis for diabetes  01/30/2025   Yearly kidney function blood test for diabetes  03/12/2025   DTaP/Tdap/Td vaccine (3 - Td or Tdap) 07/17/2028   Colon Cancer Screening  09/30/2028   Pneumococcal Vaccine for age over 20  Completed   Zoster (Shingles) Vaccine  Completed   Meningitis B Vaccine  Aged Out   Hepatitis B Vaccine  Discontinued       05/22/2024    8:40 AM  Advanced Directives  Does Patient Have a Medical Advance Directive? No  Would patient like information on creating a medical advance directive? No - Patient declined    Vision: Annual vision screenings are recommended for early detection of glaucoma, cataracts, and diabetic retinopathy. These exams can also reveal signs of chronic conditions such as diabetes and high blood pressure.  Dental: Annual dental  screenings help detect early signs of oral cancer, gum disease, and other conditions linked to overall health, including heart disease and diabetes.  Please see the attached documents for additional preventive care recommendations.

## 2024-05-23 ENCOUNTER — Encounter: Payer: Self-pay | Admitting: Internal Medicine

## 2024-05-23 DIAGNOSIS — E113299 Type 2 diabetes mellitus with mild nonproliferative diabetic retinopathy without macular edema, unspecified eye: Secondary | ICD-10-CM | POA: Insufficient documentation

## 2024-05-24 ENCOUNTER — Ambulatory Visit: Attending: General Surgery | Admitting: Physical Therapy

## 2024-05-24 ENCOUNTER — Encounter: Payer: Self-pay | Admitting: Physical Therapy

## 2024-05-24 DIAGNOSIS — M6281 Muscle weakness (generalized): Secondary | ICD-10-CM | POA: Insufficient documentation

## 2024-05-24 DIAGNOSIS — R279 Unspecified lack of coordination: Secondary | ICD-10-CM | POA: Insufficient documentation

## 2024-05-24 DIAGNOSIS — R293 Abnormal posture: Secondary | ICD-10-CM | POA: Insufficient documentation

## 2024-05-24 DIAGNOSIS — M62838 Other muscle spasm: Secondary | ICD-10-CM | POA: Diagnosis present

## 2024-05-24 NOTE — Therapy (Addendum)
 OUTPATIENT PHYSICAL THERAPY MALE PELVIC TREATMENT   Patient Name: Matthew Robarts Sr. MRN: [id number] DOB:14-Sep-1955, 67 y.o., male Today's Date: 05/24/2024  END OF SESSION:  PT End of Session - 05/24/24 0925     Visit Number 2    Date for Recertification  07/17/24    Authorization Type [employer] medicare    Progress Note Due on Visit 10    PT Start Time 0845    PT Stop Time 0930    PT Time Calculation (min) 45 min    Activity Tolerance Patient tolerated treatment well;No increased pain    Behavior During Therapy [facility] for tasks assessed/performed           Past Medical History:  Diagnosis Date   Anal fistula    Arthritis    Foot pain, bilateral    referred to podiatry 06/2014   GERD (gastroesophageal reflux disease)    long history of   Hyperlipidemia    Hypertension    Polyarthralgia    shoulders, knees, ankles, wrists; neg rheum lab screen 04/2014   Prostate cancer (HCC)    Type 2 diabetes mellitus with diabetic neuropathy (HCC)    Wears glasses    Wears partial dentures upper    Past Surgical History:  Procedure Laterality Date   COLONOSCOPY     referral pending 03/2015   FISTULOTOMY N/A 02/11/2023   Procedure: INTERROGATION OF ANAL FISTULA WITH FISTULOTOMY;  Surgeon: Debby Hila, MD;  Location: [facility];  Service: General;  Laterality: N/A;   NO PAST SURGERIES     as of 03/2015   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   DDD (degenerative disc disease), cervical 09/21/2017   DJD of acromioclavicular joint (Right) 09/21/2017   DJD of acromioclavicular joint (Bilateral) 09/21/2017   DJD of acromioclavicular joint (Left) 09/21/2017    Osteoarthritis of glenohumeral joint (Left) 09/21/2017   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/2017   Tricompartmental disease of knee (Bilateral) 09/21/2017   Osteoarthritis of knees (Bilateral) 09/21/2017   Suprapatellar effusion of knee (recurrent) (Right) 09/21/2017   Calcaneal spur of feet (Bilateral) 09/21/2017   Hypomagnesemia 09/20/2017   Low testosterone  09/20/2017   Chronic foot pain (Bilateral) 09/20/2017   Vitamin D  deficiency 04/04/2017   Chronic shoulder pain (Primary Area of Pain) (Left) 03/30/2017   Chronic knee pain (Tertiary Area of Pain) (Left) 03/30/2017   Disorder of bone, unspecified 03/30/2017   Other reduced mobility 03/30/2017   Other specified health status 03/30/2017   Opioid use agreement exists 03/30/2017   Opiate use 03/30/2017   Chronic pain syndrome 03/30/2017   Chronic upper extremity pain (Secondary Area of Pain) (Bilateral) 03/30/2017   Elevated PSA 12/05/2015   Noncompliance 12/05/2015   Hypertension associated with diabetes (HCC) 03/14/2015   Type 2 diabetes mellitus with diabetic mononeuropathy (HCC) 03/14/2015   Encounter for health maintenance examination in adult 03/14/2015   Screening for prostate cancer 03/14/2015   Special screening for malignant neoplasms, colon 03/14/2015   Hyperlipidemia 03/14/2015   Nocturia 03/14/2015   Urinary frequency 03/14/2015   Need for prophylactic vaccination and inoculation against influenza 03/14/2015   Need for prophylactic vaccination against Streptococcus pneumoniae (pneumococcus) 03/14/2015   Need for Tdap vaccination 03/14/2015   Chronic nausea  03/14/2015   Gastroesophageal reflux disease without esophagitis 03/14/2015    PCP: Vicci Barnie NOVAK, MD   REFERRING PROVIDER: Debby Hila, MD  REFERRING DIAG: [id number] (ICD-10-CM) - Other specified disorders of muscle  THERAPY DIAG:  Unspecified lack of coordination  Abnormal posture  Muscle weakness (generalized)  Other muscle  spasm  Rationale for Evaluation and Treatment: Rehabilitation  ONSET DATE: 2 years ago  SUBJECTIVE:                                                                                                                                                                                           SUBJECTIVE STATEMENT: Returning after about 6 weeks post eval.  Patient reports that he feels so so.  He says he has pressure before and after bowel movements. Reports that pain makes it hard to walk at times and climb up into the truck at work.  Reports that he lost 40 lbs. 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 Cannot remember how to do the belly massage from PT eval, has hard time sleeping, is tired.   eval complaints of rectal pain and pressure over the past few months which radiates to his prostate and worse after bowel movements. He underwent an MRI in May as well and this showed a simple right-sided posterior superficial anal fistula. He also underwent a flexible sigmoidoscopy which showed no acute findings to suggest an etiology for the rectal pain. Patient was noted to have a superficial fistula and a fistulotomy was performed on February 11, 2023. He states he has had quite a bit of constipation after surgery and still notes a pressure sensation for and some pain after bowel movements. He denies any bleeding. He denies any incontinence.  Started after completion of radiation 2 years ago due to prostate cancer. Did not have removed but did complete treatment.  Climbing up into the truck  makes the pain worse  Fluid intake: water - not much, energy drinks 2-3x(daily), soda sometimes and coffee sometimes  PAIN:  Are you  having pain? Yes NPRS scale: 8/10 Pain location: low back radiating into bil mid thighs worse on right  Pain type: aching Pain description: constant   Aggravating factors: bowel movements (fullness and urge to go and throughout passing stool and for several hours after bowel movement), after sitting for  several hours hurts when standing, walking (after a few steps), rolling over in bed  Relieving factors: medication  PRECAUTIONS: Other: history of prostate cancer  RED FLAGS: None   WEIGHT BEARING RESTRICTIONS: No  FALLS:  Has patient fallen in last 6 months? Yes. Number of falls 1 (2-3 weeks ago) on way to work and was feeling dizzy and tried to go sit down and fell before getting to chair. But had just started mounjaro    LIVING ENVIRONMENT: Lives with: lives with their son Lives in: House/apartment Stairs: Yes: External: full flight steps; on one side Has following equipment at home: None  OCCUPATION:trucks for delivery   PLOF: Independent  PATIENT GOALS: to have less pain  PERTINENT HISTORY:  Hypertension, Polyarthralgia, FISTULOTOMY, Prostate cancer,  Drug-induced constipation, Hypomagnesemia, Chronic pain syndrome, Nocturia,  Sexual abuse: no   BOWEL MOVEMENT: Pain with bowel movement: Yes Type of bowel movement:Type (Bristol Stool Scale) 1-2 without taking anything, 4-5 , Frequency if taking something for bowels will go more than 1x  but doesn't have bowel movement without taking, and Strain sometimes Fully empty rectum: Yes: sometimes Leakage: No Pads: No Fiber supplement: Yes: sometimes take softeners and/or laxatives   URINATION: Pain with urination: No but sometimes has burning Fully empty bladder: Yes: but takes a medication daily Stream: Strong and Weak Urgency: Yes:   Frequency: worse at night, 5-6x nightly; during the day 2 hours Leakage: sometimes with urgency  Pads: No  INTERCOURSE: Pain with intercourse: none Climax: yes but dry Ejaculation:  feels like he does but dry   OBJECTIVE:  Note: Objective measures were completed at Evaluation unless otherwise noted.  DIAGNOSTIC FINDINGS:    PATIENT SURVEYS:    PFIQ-7 75  COGNITION: Overall cognitive status: Within functional limits for tasks assessed     SENSATION: Light touch: Appears intact  MUSCLE LENGTH: Bil hamstrings and and adductors limited by 50%  LUMBAR SPECIAL TESTS:  Straight leg raise test: abdominal bulging noted with this in supine and pain with Rt lef  FUNCTIONAL TESTS:  5 times sit to stand: 15s  GAIT: Distance walked: 250' Assistive device utilized: None Level of assistance: Complete Independence Comments: decreased cadence, decreased step height bil, decreased stride length, decreased arm swing, forward trunk  POSTURE: rounded shoulders, forward head, and posterior pelvic tilt  PELVIC ALIGNMENT: WFL  LUMBARAROM/PROM:  A/PROM A/PROM  eval  Flexion limited by 25%  Extension WFL  Right lateral flexion limited by 25%  Left lateral flexion limited by 25%  Right rotation limited by 25%  Left rotation limited by 25%   (Blank rows = not tested)  LOWER EXTREMITY AROM/PROM:  Bil hamstrings, adductors, hip ER and IR limited by 25%  LOWER EXTREMITY MMT: Bil hips grossly 3+/5 (Rt abduction 3/5 with pain)  PALPATION: GENERAL TTP bil PSIS, Rt piriformis, Rt glute med and tightness noted throughout thoracic and lumbar paraspinals and Lt glute              External Perineal Exam to be assessed               Internal Pelvic Floor to be assessed  Patient confirms identification and approves PT to assess internal pelvic floor and treatment No  PELVIC MMT:   MMT eval  Internal Anal Sphincter   External Anal Sphincter   Puborectalis   Diastasis Recti   (Blank rows = not tested)  TONE: to be assessed   TODAY'S TREATMENT:  DATE:  05/24/2024 Review of eval, goals, symptoms, progress and water/ fiber intake Manual- hamstring stretch bilateral Neuro reed- bilateral knee fallout with support  Single knee to chest 5 breaths bilat Diaphragmatic breathing throughout stretches Abdominal massage review ( patient has been doing the opposite)    04/16/24 EVAL Examination completed, findings reviewed, pt educated on POC, HEP, and fiber handout, water intake, bladder irritants, abdominal massage, voiding mechanics, and step stool for bowel movement, pelvic floor PT role in care, male pelvic floor anatomy. Pt motivated to participate in PT and agreeable to attempt recommendations.     PATIENT EDUCATION:  Education details: HEP, and fiber handout, water intake, bladder irritants, voiding mechanics, abdominal massage and step stool for bowel movement Person educated: Patient Education method: Explanation, Demonstration, Tactile cues, Verbal cues, and Handouts Education comprehension: verbalized understanding, returned demonstration, verbal cues required, tactile cues required, and needs further education  HOME EXERCISE PROGRAM: Access Code: [email or web address] URL: [email or web address] Date: 05/24/2024 Prepared by: Cori Ercelle Winkles  Exercises - Seated Hamstring Stretch  - 1 x daily - 7 x weekly - 2 sets - 10 reps - Supported Teacher, Music with Pelvic Floor Relaxation  - 1 x daily - 7 x weekly - 2 sets - 10 reps - Single Knee to Chest Stretch  - 1 x daily - 7 x weekly - 2 sets - 10 reps  Patient Education - High-Fiber Diet to Support Pelvic Health - Bowel Emptying Techniques - Abdominal Massage for Constipation  ASSESSMENT:  CLINICAL IMPRESSION: Patient was seen today for treatment of constipation, pelvic/back/hip pain, increased urinary urgency, frequency, and urinary incontinence. Patient reports that he does not sleep, reported that he does not eat much due to rectal. Patient with tightness bilateral  hips and knees, low back decreased lordosis and weakness. Patient did well with exercises, manual therapy and education today. We discussed progress, abdominal massage and recommended consistency with increased water and fiber and PT appts as he can to see better progress and help with reduced need for pain killers . Treatment session focused on manual therapy and pelvic  floor down training to improve pain. Patient had no difficulty with new stretches. Patient is progressing slowly towards goals and will benefit from continued PT to address deficits, reduce pain and improve quality of life.      From evaluation Patient is a 68 y.o. male  who was seen today for physical therapy evaluation and treatment for constipation, pelvic/back/hip pain, increased urinary urgency, frequency, and urinary incontinence. Pt has history of prostate cancer with radiation 2 years ago and pain has been present since then. Reports he does take pain medication daily and has constipation with this as well.  Pt arrived late to appointment and did have several questions, all answered and due to time constraints and pt benefiting from education on pelvic floor PT and anatomy, internal not completed today. Will at future appointment if needed and pt consents. Pt demonstrated decreased flexibility in spine and hips, decreased core and hip strength, pain at back,hips, gluteals but all worse on right side. Pt educated on HEP, and fiber handout, water intake, bladder irritants, voiding mechanics, abdominal massage, and step stool for bowel movements. Pt denied questions at end of session. Pt would benefit from additional PT to further address deficits.    OBJECTIVE IMPAIRMENTS: decreased activity tolerance, decreased coordination, decreased endurance, decreased knowledge of condition, decreased mobility, difficulty walking, decreased ROM, decreased strength, increased fascial restrictions, increased muscle spasms, impaired flexibility,  improper body mechanics, postural dysfunction,  and pain.   ACTIVITY LIMITATIONS: carrying, lifting, bending, sitting, standing, squatting, sleeping, stairs, transfers, continence, and locomotion level  PARTICIPATION LIMITATIONS: interpersonal relationship, community activity, occupation, and yard work  PERSONAL FACTORS: Time since onset of injury/illness/exacerbation and 1 comorbidity: medical history are also affecting patient's functional outcome.   REHAB POTENTIAL: Good  CLINICAL DECISION MAKING: Stable/uncomplicated  EVALUATION COMPLEXITY: Low   GOALS: Goals reviewed with patient? Yes  SHORT TERM GOALS: Target date: 05/14/24  Pt to be I with HEP for carry over and continuing recommendations for improved outcomes.   Baseline: Goal status: INITIAL  2.  Pt will be independent with use of squatty potty, relaxed toileting mechanics, and improved bowel movement techniques in order to increase ease of bowel movements and complete evacuation.   Baseline:  Goal status: INITIAL  3.  Pt will be independent with the knack, urge suppression technique, and double voiding in order to improve bladder habits and decrease urinary incontinence.   Baseline:  Goal status: INITIAL  4.  Pt to demonstrate improved lumbopelvic mobility with <25% restrictions to improve tolerance to standing at least 30 mins. Baseline:  Goal status: INITIAL   LONG TERM GOALS: Target date: 07/17/24  Pt to be I with advanced HEP for carry over and continuing recommendations for improved outcomes.   Baseline:  Goal status: INITIAL  2.  Pt will report her BMs are complete at least 75% of the time due to improved bowel habits and evacuation techniques for improved bowel health. Baseline:  Goal status: INITIAL  3.  Pt to report ability to complete full day at work without worsening pain to better attend work daily. Baseline:  Goal status: INITIAL  4.  Pt to demonstrate improved gait mechanics without  compensation for at least 1000' for improved tolerance to walking to/from trucks at work.  Baseline:  Goal status: INITIAL  5.  Pt to demonstrate x10 10# squats without compensation for improved pelvic stability to tolerate carrying in groceries.  Baseline:  Goal status: INITIAL  6.  Pt to report no urinary incontinence for at least one week for improved confidence in making to the bathroom while in community.  Baseline:  Goal status: INITIAL   PLAN:  PT FREQUENCY: 1x/week  PT DURATION: 12 sessions  PLANNED INTERVENTIONS: 97110-Therapeutic exercises, 97530- Therapeutic activity, 97112- Neuromuscular re-education, [phone]- Self Care, 02859- Manual therapy, [phone]- Gait training, [phone]- Canalith repositioning, [phone]- Aquatic Therapy, [phone]- Electrical stimulation (manual), [id number]- Vasopneumatic device, [phone] (1-2 muscles), [id number] (3+ muscles)- Dry Needling, Patient/Family education, Taping, Joint mobilization, Spinal mobilization, Scar mobilization, Vestibular training, DME instructions, Cryotherapy, Moist heat, and Biofeedback  PLAN FOR NEXT SESSION: internal if consents, HEP, back and hip mobility/stretches, pelvic floor downtraining if needed, breathing and voiding mechanics, has knee pain- try nu step or bike next  Back pain, try back strengthening  Bridges,Internal if ok with him   Cori Florida, PT, DPT 11/20/259:26 AM  [facility] [address], [address] [place], [place] [address] Phone # [phone] Fax [phone]

## 2024-05-28 ENCOUNTER — Telehealth: Payer: Self-pay | Admitting: Physical Therapy

## 2024-05-28 ENCOUNTER — Ambulatory Visit: Admitting: Physical Therapy

## 2024-05-28 NOTE — Telephone Encounter (Signed)
 Called patient re missed visit this morning, patient to call us  at [phone] and let us  know if he wants to keep coming to PT. He has had several cancellations and will need to make one appt at a time due to department policy

## 2024-06-06 ENCOUNTER — Encounter: Admitting: Physical Therapy

## 2024-06-12 ENCOUNTER — Encounter: Admitting: Physical Therapy

## 2024-06-19 ENCOUNTER — Encounter: Admitting: Physical Therapy

## 2024-06-19 ENCOUNTER — Ambulatory Visit: Admitting: Internal Medicine

## 2024-06-26 ENCOUNTER — Encounter: Admitting: Physical Therapy

## 2024-07-06 ENCOUNTER — Encounter: Admitting: Physical Therapy

## 2024-07-09 DIAGNOSIS — E1142 Type 2 diabetes mellitus with diabetic polyneuropathy: Secondary | ICD-10-CM

## 2024-07-17 ENCOUNTER — Encounter: Payer: Self-pay | Admitting: Hematology and Oncology

## 2024-07-17 ENCOUNTER — Telehealth: Payer: Self-pay | Admitting: Orthopedic Surgery

## 2024-07-17 NOTE — Telephone Encounter (Signed)
 Matthew Colon is calling to get the results of his c-spine MRI.  He states he had it about a month ago and has not heard from anyone.  Call back # is [phone].

## 2024-07-20 ENCOUNTER — Ambulatory Visit: Admitting: Physical Medicine and Rehabilitation

## 2024-07-23 ENCOUNTER — Ambulatory Visit: Admitting: Physical Medicine and Rehabilitation

## 2024-07-24 ENCOUNTER — Ambulatory Visit: Admitting: Internal Medicine

## 2024-07-25 ENCOUNTER — Other Ambulatory Visit: Payer: Self-pay | Admitting: Urology

## 2024-07-25 ENCOUNTER — Encounter: Payer: Self-pay | Admitting: Physical Medicine and Rehabilitation

## 2024-07-25 ENCOUNTER — Ambulatory Visit: Admitting: Physical Medicine and Rehabilitation

## 2024-07-25 DIAGNOSIS — M47812 Spondylosis without myelopathy or radiculopathy, cervical region: Secondary | ICD-10-CM

## 2024-07-25 DIAGNOSIS — M5412 Radiculopathy, cervical region: Secondary | ICD-10-CM | POA: Diagnosis not present

## 2024-07-25 DIAGNOSIS — R399 Unspecified symptoms and signs involving the genitourinary system: Secondary | ICD-10-CM

## 2024-07-25 DIAGNOSIS — M4802 Spinal stenosis, cervical region: Secondary | ICD-10-CM | POA: Diagnosis not present

## 2024-07-25 NOTE — Progress Notes (Signed)
 Pain Scale   Average Pain 7 Patient advising he has chronic neck pain.  Patient here for MRI Review        +Driver, -BT, -Dye Allergies.

## 2024-07-25 NOTE — Progress Notes (Signed)
 "  [name] - 69 y.o. male MRN [id number]  Date of birth: 02/26/1956  Office Visit Note: Visit Date: 07/25/2024 PCP: Vicci Barnie NOVAK, MD Referred by: Vicci Barnie NOVAK, MD  Subjective: Chief Complaint  Patient presents with   Neck - Pain   HPI: Matthew Waymire Sr. is a 69 y.o. male who comes in today for evaluation of chronic, worsening and severe right sided neck pain radiating to shoulder and upper arm. He is here today for cervical MRI review. Pain ongoing for several years. His pain worsens with movement and activity. He reports severe pain with side to side rotation, describes as sharp and stabbing sensation, currently rates as 7 out of 10. Also reports numbness/tingling to right hand. Some relief of pain with home exercise regimen, rest and use of medications. He recently attended several sessions of physical therapy for more posture and muscle weakness, minimal relief of symptoms with these treatments. He is currently undergoing chronic pain management with Vernell Pass, NP at [facility] for more chronic shoulder and knee issues. He is prescribed 180 tablets of 10-325 mg Percocet monthly. Recent cervical MRI imaging shows significantly motion degraded examination with blooming artifact affecting the posterior soft tissues at the C3 and C4 levels, presumably from a ferrous structure on the patient's neck. Disc space narrowing and bilateral uncovertebral joint hypertrophy at C3-C4. Mild to moderate central canal stenosis at C4-C5 and C5-C6 levels. Patient did inform me that the MRI tech found a dry cleaning staple in his undershirt that likely interfered with scan. Patient denies focal weakness. No recent trauma or falls.   He is currently working as estate agent and naval architect. Patients course is complicated by diabetes mellitus and prostate cancer.       Review of Systems  Musculoskeletal:  Positive for myalgias and neck pain.  Neurological:  Positive for  tingling. Negative for focal weakness and weakness.  All other systems reviewed and are negative.  Otherwise per HPI.  Assessment & Plan: Visit Diagnoses:    ICD-10-CM   1. Radiculopathy, cervical region  M54.12 Ambulatory referral to Physical Medicine Rehab    2. Spinal stenosis of cervical region  M48.02 Ambulatory referral to Physical Medicine Rehab    3. Facet arthropathy, cervical  M47.812 Ambulatory referral to Physical Medicine Rehab       Plan: Findings:  Chronic, worsening and severe right sided neck pain radiating to shoulder and upper arm. Patient continues to have severe pain despite good conservative therapies such as formal physical therapy, home exercise regimen, rest and use of medications. I discussed cervical MRI with patient today using imaging and spine model. Patients clinical presentation and exam are consistent with radiculopathy. There are multi level degenerative changes and mild to moderate spinal canal stenosis at C4-C5 and C5-C6. We discussed treatment plan in detail today. Next step is to perform diagnostic and hopefully therapeutic right C7-T1 interlaminar epidural steroid injection under fluoroscopic guidance. He is not currently taking anticoagulants. If good relief of pain with injection we can repeat this procedure infrequently as needed. I discussed injection procedure in detail today, he has no questions at this time. His exam today is non focal, good strength noted to bilateral upper extremities.     Meds & Orders: No orders of the defined types were placed in this encounter.   Orders Placed This Encounter  Procedures   Ambulatory referral to Physical Medicine Rehab    Follow-up: Return for Right C7-T1 interlaminar epidural steroid injection.  Procedures: No procedures performed      Clinical History: Narrative & Impression EXAM: MRI CERVICAL SPINE WITHOUT CONTRAST 05/10/2024 08:14:41 AM   TECHNIQUE: Multiplanar multisequence MRI of the  cervical spine was performed.   COMPARISON: Cervical spine series dated 04/23/2024. The study is significantly degraded by patient motion. There is also blooming artifact affecting the soft tissues of the posterior neck at the C3 and C4 vertebral body levels, presumably from ferrous structure on the patient's neck.   CLINICAL HISTORY: Neck pain, chronic.   FINDINGS:   BONES AND ALIGNMENT: There is straightening of the normal cervical lordosis. Normal vertebral body heights. Bone marrow signal is unremarkable.   SPINAL CORD: The spinal cord appears to be normal in morphology and signal intensity.   SOFT TISSUES: No paraspinal mass.   C2-C3: No significant disc herniation. No spinal canal stenosis or neural foraminal narrowing.   C3-C4: There is disc space narrowing and bilateral uncovertebral joint hypertrophy, with mild central spinal canal stenosis and mild bilateral neural foraminal stenosis.   C4-C5: There is disc space narrowing and endplate ridging, causing mild-to-moderate central spinal canal stenosis and bilateral neural foraminal stenosis.   C5-C6: There is disc space narrowing and bilateral uncovertebral joint-type hypertrophy, with mild-to-moderate central spinal canal stenosis and bilateral neural foraminal stenosis.   C6-C7: There is mild central spinal canal stenosis and mild left neural foraminal stenosis.   C7-T1: There is mild central spinal canal stenosis.   IMPRESSION: 1. Significantly motion degraded examination with blooming artifact affecting the posterior soft tissues at the C3 and C4 levels, presumably from a ferrous structure on the patient's neck. 2. Disc space narrowing and bilateral uncovertebral joint hypertrophy at C3-4, with mild central spinal canal stenosis and mild bilateral neural foraminal stenosis. 3. Disc space narrowing and endplate ridging at C4-5, causing mild-to-moderate central spinal canal stenosis and bilateral  neural foraminal stenosis. 4. Disc space narrowing and bilateral uncovertebral joint-type hypertrophy at C5-6, with mild-to-moderate central spinal canal stenosis and bilateral neural foraminal stenosis. 5. Mild central spinal canal stenosis at C6-7 and C7-T1, with mild left neural foraminal stenosis at C6-7.   Electronically signed by: Evalene Coho MD 07/18/2024 01:00 PM EST RP Workstation: [id number]   He reports that he has quit smoking. His smoking use included cigarettes. He has been exposed to tobacco smoke. He has never used smokeless tobacco.  Recent Labs    11/15/23 0934  HGBA1C 5.6    Objective:  VS:  HT:    WT:   BMI:     BP:   HR: bpm  TEMP: ( )  RESP:  Physical Exam Vitals and nursing note reviewed.  HENT:     Head: Normocephalic and atraumatic.     Right Ear: External ear normal.     Left Ear: External ear normal.     Nose: Nose normal.     Mouth/Throat:     Mouth: Mucous membranes are moist.  Eyes:     Extraocular Movements: Extraocular movements intact.  Cardiovascular:     Rate and Rhythm: Normal rate.     Pulses: Normal pulses.  Pulmonary:     Effort: Pulmonary effort is normal.  Abdominal:     General: Abdomen is flat. There is no distension.  Musculoskeletal:        General: Tenderness present.     Cervical back: Tenderness present.     Comments: Pain noted with flexion, extension and side-to-side rotation. Patient has good strength in the upper extremities including 5  out of 5 strength in wrist extension, long finger flexion and APB. Shoulder range of motion is full bilaterally without any sign of impingement. There is no atrophy of the hands intrinsically. Sensation intact bilaterally. Negative Hoffman's sign. Negative Spurling's sign.     Skin:    General: Skin is warm and dry.     Capillary Refill: Capillary refill takes less than 2 seconds.  Neurological:     General: No focal deficit present.     Mental Status: He is alert and oriented  to person, place, and time.  Psychiatric:        Mood and Affect: Mood normal.        Behavior: Behavior normal.     Ortho Exam  Imaging: No results found.  Past Medical/Family/Surgical/Social History: Medications & Allergies reviewed per EMR, new medications updated. 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   DDD (degenerative disc disease), cervical 09/21/2017   DJD of acromioclavicular joint (Right) 09/21/2017   DJD of acromioclavicular joint (Bilateral) 09/21/2017   DJD of acromioclavicular joint (Left) 09/21/2017   Osteoarthritis of glenohumeral joint (Left) 09/21/2017   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/2017   Tricompartmental disease of knee (Bilateral) 09/21/2017   Osteoarthritis of knees (Bilateral) 09/21/2017   Suprapatellar effusion of knee (recurrent) (Right) 09/21/2017   Calcaneal spur of feet (Bilateral) 09/21/2017   Hypomagnesemia 09/20/2017   Low testosterone  09/20/2017   Chronic foot pain (Bilateral) 09/20/2017   Vitamin D  deficiency 04/04/2017   Chronic shoulder pain (Primary Area of Pain) (Left) 03/30/2017   Chronic knee pain (Tertiary Area of Pain) (Left) 03/30/2017   Disorder of bone, unspecified 03/30/2017   Other reduced mobility 03/30/2017   Other specified health status 03/30/2017   Opioid use agreement exists 03/30/2017   Opiate use 03/30/2017   Chronic pain syndrome 03/30/2017   Chronic upper extremity pain (Secondary Area of Pain) (Bilateral) 03/30/2017   Elevated PSA 12/05/2015   Noncompliance 12/05/2015   Hypertension associated with diabetes (HCC) 03/14/2015   Type 2 diabetes mellitus with diabetic mononeuropathy (HCC)  03/14/2015   Encounter for health maintenance examination in adult 03/14/2015   Screening for prostate cancer 03/14/2015   Special screening for malignant neoplasms, colon 03/14/2015   Hyperlipidemia 03/14/2015   Nocturia 03/14/2015   Urinary frequency 03/14/2015   Need for prophylactic vaccination and inoculation against influenza 03/14/2015   Need for prophylactic vaccination against Streptococcus pneumoniae (pneumococcus) 03/14/2015   Need for Tdap vaccination 03/14/2015   Chronic nausea 03/14/2015   Gastroesophageal reflux disease without esophagitis 03/14/2015   Past Medical History:  Diagnosis Date   Anal fistula    Arthritis    Foot pain, bilateral    referred to podiatry 06/2014   GERD (gastroesophageal reflux disease)    long history of   Hyperlipidemia    Hypertension    Polyarthralgia    shoulders, knees, ankles, wrists; neg rheum lab screen 04/2014   Prostate cancer (HCC)    Type 2 diabetes mellitus with diabetic neuropathy (HCC)    Wears glasses    Wears partial dentures upper    Family History  Problem Relation Age of Onset   Diabetes Mother    Hypertension Mother    Other Father  murdered   Diabetes Sister    Arthritis Sister    Heart disease Sister    Diabetes Sister    Diabetes Sister    Cancer Neg Hx    Stroke Neg Hx    Prostate cancer Neg Hx        unknown   Bladder Cancer Neg Hx        unkown   Kidney cancer Neg Hx        unkown   Past Surgical History:  Procedure Laterality Date   COLONOSCOPY     referral pending 03/2015   FISTULOTOMY N/A 02/11/2023   Procedure: INTERROGATION OF ANAL FISTULA WITH FISTULOTOMY;  Surgeon: Debby Hila, MD;  Location: [facility];  Service: General;  Laterality: N/A;   NO PAST SURGERIES     as of 03/2015   Social History   Occupational History   Occupation: switcher/ spotter/forklift  Tobacco Use   Smoking status: Former    Types: Cigarettes    Passive exposure: Current (1 1/2 mnth)    Smokeless tobacco: Never  Vaping Use   Vaping status: Never Used  Substance and Sexual Activity   Alcohol use: Not Currently    Comment: rare beer   Drug use: No   Sexual activity: Yes    "

## 2024-07-27 ENCOUNTER — Other Ambulatory Visit: Payer: Self-pay

## 2024-07-27 ENCOUNTER — Ambulatory Visit: Admitting: Physical Medicine and Rehabilitation

## 2024-07-27 VITALS — BP 123/73 | HR 80

## 2024-07-27 DIAGNOSIS — M5412 Radiculopathy, cervical region: Secondary | ICD-10-CM | POA: Diagnosis not present

## 2024-07-27 MED ORDER — METHYLPREDNISOLONE ACETATE 40 MG/ML IJ SUSP
40.0000 mg | Freq: Once | INTRAMUSCULAR | Status: AC
  Administered 2024-07-27: 40 mg

## 2024-07-27 NOTE — Progress Notes (Signed)
 "  Matthew Eisenhour Sr. - 69 y.o. male MRN [id number]  Date of birth: 15-Sep-1955  Office Visit Note: Visit Date: 07/27/2024 PCP: Vicci Barnie NOVAK, MD Referred by: Vicci Barnie NOVAK, MD  Subjective: Chief Complaint  Patient presents with   Neck - Pain   HPI:  Matthew Soules Sr. is a 69 y.o. male who comes in today at the request of Duwaine Pouch, FNP for planned Right C7-T1 Cervical Interlaminar epidural steroid injection with fluoroscopic guidance.  The patient has failed conservative care including home exercise, medications, time and activity modification.  This injection will be diagnostic and hopefully therapeutic.  Please see requesting physician notes for further details and justification.   ROS Otherwise per HPI.  Assessment & Plan: Visit Diagnoses:    ICD-10-CM   1. Cervical radiculopathy  M54.12 XR C-ARM NO REPORT    Epidural Steroid injection    methylPREDNISolone  acetate (DEPO-MEDROL ) injection 40 mg      Plan: No additional findings.   Meds & Orders:  Meds ordered this encounter  Medications   methylPREDNISolone  acetate (DEPO-MEDROL ) injection 40 mg    Orders Placed This Encounter  Procedures   XR C-ARM NO REPORT   Epidural Steroid injection    Follow-up: Return for visit to requesting provider as needed.   Procedures: No procedures performed  Cervical Epidural Steroid Injection - Interlaminar Approach with Fluoroscopic Guidance  Patient: Matthew Szilagyi Sr.      Date of Birth: 02/01/1956 MRN: [id number] PCP: Vicci Barnie NOVAK, MD      Visit Date: 07/27/2024   Universal Protocol:    Date/Time: 07/27/2608:28 AM  Consent Given By: the patient  Position: PRONE  Additional Comments: Vital signs were monitored before and after the procedure. Patient was prepped and draped in the usual sterile fashion. The correct patient, procedure, and site was verified.   Injection Procedure Details:   Procedure diagnoses: Cervical radiculopathy [M54.12]     Meds Administered:  Meds ordered this encounter  Medications   methylPREDNISolone  acetate (DEPO-MEDROL ) injection 40 mg     Laterality: Right  Location/Site: C7-T1  Needle: 3.5 in., 20 ga. Tuohy  Needle Placement: Paramedian epidural space  Findings:  -Comments: Excellent flow of contrast into the epidural space.  Procedure Details: Using a paramedian approach from the side mentioned above, the region overlying the inferior lamina was localized under fluoroscopic visualization and the soft tissues overlying this structure were infiltrated with 4 ml. of 1% Lidocaine  without Epinephrine . A # 20 gauge, Tuohy needle was inserted into the epidural space using a paramedian approach.  The epidural space was localized using loss of resistance along with contralateral oblique bi-planar fluoroscopic views.  After negative aspirate for air, blood, and CSF, a 2 ml. volume of Isovue-250 was injected into the epidural space and the flow of contrast was observed. Radiographs were obtained for documentation purposes.   The injectate was administered into the level noted above.  Additional Comments:  The patient tolerated the procedure well Dressing: 2 x 2 sterile gauze and Band-Aid    Post-procedure details: Patient was observed during the procedure. Post-procedure instructions were reviewed.  Patient left the clinic in stable condition.   Clinical History: Narrative & Impression EXAM: MRI CERVICAL SPINE WITHOUT CONTRAST 05/10/2024 08:14:41 AM   TECHNIQUE: Multiplanar multisequence MRI of the cervical spine was performed.   COMPARISON: Cervical spine series dated 04/23/2024. The study is significantly degraded by patient motion. There is also blooming artifact affecting the soft tissues of the posterior neck at the  C3 and C4 vertebral body levels, presumably from ferrous structure on the patient's neck.   CLINICAL HISTORY: Neck pain, chronic.   FINDINGS:   BONES AND  ALIGNMENT: There is straightening of the normal cervical lordosis. Normal vertebral body heights. Bone marrow signal is unremarkable.   SPINAL CORD: The spinal cord appears to be normal in morphology and signal intensity.   SOFT TISSUES: No paraspinal mass.   C2-C3: No significant disc herniation. No spinal canal stenosis or neural foraminal narrowing.   C3-C4: There is disc space narrowing and bilateral uncovertebral joint hypertrophy, with mild central spinal canal stenosis and mild bilateral neural foraminal stenosis.   C4-C5: There is disc space narrowing and endplate ridging, causing mild-to-moderate central spinal canal stenosis and bilateral neural foraminal stenosis.   C5-C6: There is disc space narrowing and bilateral uncovertebral joint-type hypertrophy, with mild-to-moderate central spinal canal stenosis and bilateral neural foraminal stenosis.   C6-C7: There is mild central spinal canal stenosis and mild left neural foraminal stenosis.   C7-T1: There is mild central spinal canal stenosis.   IMPRESSION: 1. Significantly motion degraded examination with blooming artifact affecting the posterior soft tissues at the C3 and C4 levels, presumably from a ferrous structure on the patient's neck. 2. Disc space narrowing and bilateral uncovertebral joint hypertrophy at C3-4, with mild central spinal canal stenosis and mild bilateral neural foraminal stenosis. 3. Disc space narrowing and endplate ridging at C4-5, causing mild-to-moderate central spinal canal stenosis and bilateral neural foraminal stenosis. 4. Disc space narrowing and bilateral uncovertebral joint-type hypertrophy at C5-6, with mild-to-moderate central spinal canal stenosis and bilateral neural foraminal stenosis. 5. Mild central spinal canal stenosis at C6-7 and C7-T1, with mild left neural foraminal stenosis at C6-7.   Electronically signed by: Evalene Coho MD 07/18/2024 01:00 PM EST  RP Workstation: [id number]     Objective:  VS:  HT:    WT:   BMI:     BP:123/73  HR:80bpm  TEMP: ( )  RESP:  Physical Exam Vitals and nursing note reviewed.  Constitutional:      General: He is not in acute distress.    Appearance: Normal appearance. He is not ill-appearing.  HENT:     Head: Normocephalic and atraumatic.     Right Ear: External ear normal.     Left Ear: External ear normal.  Eyes:     Extraocular Movements: Extraocular movements intact.  Cardiovascular:     Rate and Rhythm: Normal rate.     Pulses: Normal pulses.  Abdominal:     General: There is no distension.     Palpations: Abdomen is soft.  Musculoskeletal:        General: No signs of injury.     Cervical back: Neck supple. Tenderness present. No rigidity.     Right lower leg: No edema.     Left lower leg: No edema.     Comments: Patient has good strength in the upper extremities with 5 out of 5 strength in wrist extension long finger flexion APB.  No intrinsic hand muscle atrophy.  Negative Hoffmann's test.  Lymphadenopathy:     Cervical: No cervical adenopathy.  Skin:    Findings: No erythema or rash.  Neurological:     General: No focal deficit present.     Mental Status: He is alert and oriented to person, place, and time.     Sensory: No sensory deficit.     Motor: No weakness or abnormal muscle tone.     Coordination:  Coordination normal.  Psychiatric:        Mood and Affect: Mood normal.        Behavior: Behavior normal.      Imaging: No results found. "

## 2024-07-27 NOTE — Progress Notes (Signed)
 Pain Scale   Average Pain 6 Patient advising he has neck pain that radiates to right side pain comes and goes when moving.        +Driver, -BT, -Dye Allergies.

## 2024-07-27 NOTE — Procedures (Signed)
 Cervical Epidural Steroid Injection - Interlaminar Approach with Fluoroscopic Guidance  Patient: Matthew Linker Sr.      Date of Birth: 01-25-1956 MRN: [id number] PCP: Vicci Barnie NOVAK, MD      Visit Date: 07/27/2024   Universal Protocol:    Date/Time: 07/27/2608:28 AM  Consent Given By: the patient  Position: PRONE  Additional Comments: Vital signs were monitored before and after the procedure. Patient was prepped and draped in the usual sterile fashion. The correct patient, procedure, and site was verified.   Injection Procedure Details:   Procedure diagnoses: Cervical radiculopathy [M54.12]    Meds Administered:  Meds ordered this encounter  Medications   methylPREDNISolone  acetate (DEPO-MEDROL ) injection 40 mg     Laterality: Right  Location/Site: C7-T1  Needle: 3.5 in., 20 ga. Tuohy  Needle Placement: Paramedian epidural space  Findings:  -Comments: Excellent flow of contrast into the epidural space.  Procedure Details: Using a paramedian approach from the side mentioned above, the region overlying the inferior lamina was localized under fluoroscopic visualization and the soft tissues overlying this structure were infiltrated with 4 ml. of 1% Lidocaine  without Epinephrine . A # 20 gauge, Tuohy needle was inserted into the epidural space using a paramedian approach.  The epidural space was localized using loss of resistance along with contralateral oblique bi-planar fluoroscopic views.  After negative aspirate for air, blood, and CSF, a 2 ml. volume of Isovue-250 was injected into the epidural space and the flow of contrast was observed. Radiographs were obtained for documentation purposes.   The injectate was administered into the level noted above.  Additional Comments:  The patient tolerated the procedure well Dressing: 2 x 2 sterile gauze and Band-Aid    Post-procedure details: Patient was observed during the procedure. Post-procedure instructions were  reviewed.  Patient left the clinic in stable condition.

## 2024-08-01 ENCOUNTER — Telehealth: Payer: Self-pay | Admitting: Internal Medicine

## 2024-08-01 ENCOUNTER — Other Ambulatory Visit: Payer: Self-pay

## 2024-08-01 DIAGNOSIS — C61 Malignant neoplasm of prostate: Secondary | ICD-10-CM

## 2024-08-01 NOTE — Telephone Encounter (Signed)
 Called & spoke to the patient. Verified name & DOB. Inquired to further clarify need for referral. Patient stated that he has previously spoken to Dr.Johnson about this issue. Patient reports experiencing all day soreness of RT side of neck X44mo  When eating patient feels hot / sweaty X1 mo. Patient denies experiencing acid reflux / heartburn. No particular foods cause this reaction it can be anything that he eats. Please advise if an appointment is needed first. Thank you in advance.

## 2024-08-01 NOTE — Telephone Encounter (Signed)
 Yes. Please offer him appt for me to evaluate and determine best course of action.

## 2024-08-01 NOTE — Telephone Encounter (Signed)
 Copied from CRM #[id number]. Topic: Appointments - Transfer of Care >> Aug 01, 2024  3:05 PM   Willma R wrote:  Pt is requesting to transfer FROM: Matthew Colon Pt is requesting to transfer TO: Toribio Slain Reason for requested transfer: Change in Insurance It is the responsibility of the team the patient would like to transfer to (Dr. Toribio Slain) to reach out to the patient if for any reason this transfer is not acceptable.

## 2024-08-01 NOTE — Telephone Encounter (Signed)
 Copied from CRM #[id number]. Topic: Referral - Request for Referral >> Aug 01, 2024  4:08 PM   Edsel HERO wrote:  Did the patient discuss referral with their provider in the last year? Yes (If No - schedule appointment) (If Yes - send message)  Appointment offered? No  Type of order/referral and detailed reason for visit: ENT, throat irritation  Preference of office, provider, location: Any  If referral order, have you been seen by this specialty before? No (If Yes, this issue or another issue? When? Where?  Can we respond through MyChart? No

## 2024-08-02 NOTE — Telephone Encounter (Signed)
 Noted! Thank you

## 2024-08-03 ENCOUNTER — Other Ambulatory Visit: Payer: Self-pay | Admitting: Internal Medicine

## 2024-08-03 DIAGNOSIS — E1142 Type 2 diabetes mellitus with diabetic polyneuropathy: Secondary | ICD-10-CM

## 2024-08-06 ENCOUNTER — Other Ambulatory Visit: Payer: Self-pay | Admitting: Internal Medicine

## 2024-08-06 DIAGNOSIS — E1142 Type 2 diabetes mellitus with diabetic polyneuropathy: Secondary | ICD-10-CM

## 2024-08-08 ENCOUNTER — Encounter: Payer: Self-pay | Admitting: Hematology and Oncology

## 2024-08-08 ENCOUNTER — Ambulatory Visit: Admitting: Family Medicine

## 2024-08-08 ENCOUNTER — Encounter: Payer: Self-pay | Admitting: Family Medicine

## 2024-08-08 DIAGNOSIS — E1169 Type 2 diabetes mellitus with other specified complication: Secondary | ICD-10-CM

## 2024-08-08 DIAGNOSIS — R399 Unspecified symptoms and signs involving the genitourinary system: Secondary | ICD-10-CM

## 2024-08-08 DIAGNOSIS — K219 Gastro-esophageal reflux disease without esophagitis: Secondary | ICD-10-CM

## 2024-08-08 DIAGNOSIS — E1142 Type 2 diabetes mellitus with diabetic polyneuropathy: Secondary | ICD-10-CM

## 2024-08-08 DIAGNOSIS — E1159 Type 2 diabetes mellitus with other circulatory complications: Secondary | ICD-10-CM

## 2024-08-08 MED ORDER — MUPIROCIN 2 % EX OINT: 1.0000 | TOPICAL_OINTMENT | Freq: Two times a day (BID) | CUTANEOUS | 2 refills | Status: AC

## 2024-08-08 MED ORDER — TAMSULOSIN HCL 0.4 MG PO CAPS: ORAL_CAPSULE | ORAL | 3 refills | Status: AC

## 2024-08-08 MED ORDER — TADALAFIL 20 MG PO TABS: 20.0000 mg | ORAL_TABLET | Freq: Every day | ORAL | 6 refills | Status: AC | PRN

## 2024-08-08 MED ORDER — METFORMIN HCL ER 500 MG PO TB24: ORAL_TABLET | ORAL | 3 refills | Status: AC

## 2024-08-08 MED ORDER — OLMESARTAN MEDOXOMIL 20 MG PO TABS: 20.0000 mg | ORAL_TABLET | Freq: Every day | ORAL | 3 refills | Status: AC

## 2024-08-08 MED ORDER — PANTOPRAZOLE SODIUM 20 MG PO TBEC: 20.0000 mg | DELAYED_RELEASE_TABLET | Freq: Every day | ORAL | 3 refills | Status: AC

## 2024-08-08 MED ORDER — FLUTICASONE PROPIONATE 50 MCG/ACT NA SUSP: NASAL | 5 refills | Status: AC

## 2024-08-08 NOTE — Progress Notes (Unsigned)
 "   Subjective   Patient ID: Matthew Vanallen Sr., male    DOB: 1956-05-23  Age: 69 y.o. MRN: [id number]  Chief Complaint  Patient presents with   New Patient (Initial Visit)    Discussed the use of AI scribe software for clinical note transcription with the patient, who gave verbal consent to proceed.  History of Present Illness   Matthew Scheel Sr. is a 69 year old male who presents with episodes of sweating after eating and urination.  He reports a 1-month history of episodes of sweating and feeling hot, now occurring almost every time he eats and occasionally after urination. Symptoms may also occur when he lies down after eating or on waking. He notes occasional associated nausea but no pain. He recalls similar hot flashes during prostate cancer treatment and is unsure if current symptoms feel similar.  He had prostate cancer treated with radiation and currently takes tamsulosin  for urinary symptoms after alfuzosin  was ineffective. He has upcoming urology and lab visits.  He has diabetes treated with metformin . He previously used Mounjaro  injections, but after taking Mounjaro , gabapentin , and oxycodone  together he had an adverse event with a fall, so he stopped Mounjaro  and gabapentin  and continues oxycodone  only. Gabapentin  was prescribed for nerve pain, which persists.  He had a fistulotomy two years ago and reports chronic lower back area pain. Pelvic floor physical therapy was not helpful. He had two colonoscopies in 2023 and is not due again until 2030.  He has a recurrent rash inside his nose that itches and becomes sore. He applies Neosporin and Hepazone with a Q-tip. He denies drainage or pus.  He lives with his 89 year old son, is single, and is retired. 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 He recently restarted smoking after more than 20 years of abstinence and now smokes occasionally, especially when taking oxycodone , with a past history of  smoking a pack per day in youth. He denies alcohol and recreational drug use.          The 10-year ASCVD risk score (Arnett DK, et al., 2019) is: 29.2%  Health Maintenance Due  Topic Date Due   Hepatitis C Screening  Never done   COVID-19 Vaccine (5 - 2025-26 season) 03/05/2024   HEMOGLOBIN A1C  05/17/2024   Diabetic kidney evaluation - Urine ACR  08/02/2024      Objective:     BP 137/76   Pulse 73   Ht 5' 11 (1.803 m)   Wt 253 lb 6.4 oz (114.9 kg)   SpO2 98%   BMI 35.34 kg/m  {Vitals History (Optional):23777}  Physical Exam   HEENT: Rash on the nasal mucosa, sometimes pruritic and tender. CHEST: Lungs clear to auscultation bilaterally. No wheezes, rhonchi, or crackles.        No results found for any visits on 08/08/24.      Assessment & Plan:   Type 2 diabetes mellitus with peripheral neuropathy (HCC) -     metFORMIN  HCl ER; TAKE 1 TABLET BY MOUTH EVERY DAY WITH BREAKFAST  Dispense: 90 tablet; Refill: 3  Hypertension associated with diabetes (HCC) -     Olmesartan  Medoxomil; Take 1 tablet (20 mg total) by mouth daily.  Dispense: 90 tablet; Refill: 3  Gastroesophageal reflux disease without esophagitis -     Pantoprazole  Sodium; Take 1 tablet (20 mg total) by mouth daily.  Dispense: 90 tablet; Refill: 3  Erectile dysfunction associated with type 2  diabetes mellitus (HCC) -     Tadalafil ; Take 1 tablet (20 mg total) by mouth daily as needed for erectile dysfunction (take 45 minutes prior to sexual activity).  Dispense: 30 tablet; Refill: 6  Lower urinary tract symptoms (LUTS) -     Tamsulosin  HCl; TAKE 1 CAPSULE BY MOUTH EVERY DAY AFTER SUPPER  Dispense: 90 capsule; Refill: 3  Other orders -     Fluticasone  Propionate; SPRAY 2 SPRAYS INTO EACH NOSTRIL EVERY DAY  Dispense: 16 mL; Refill: 5 -     Mupirocin ; Apply 1 Application topically 2 (two) times daily. To the nostrils  Dispense: 22 g; Refill: 2    Assessment and Plan    Type 2 diabetes mellitus  complicated by diabetic neuropathy and circulatory complications Diabetes management complicated by neuropathy, potentially affecting autonomic nerves. Differential diagnosis includes serotonin-secreting tumors, but neuropathy is suspected if other causes are ruled out. - Ordered blood tests: A1c, kidney and liver function, serotonin-secreting tumors. - Advised smaller, more frequent meals. - Scheduled follow-up in three months to review lab results and adjust management.  Lower urinary tract symptoms Hot and sweaty sensations during urination, possibly related to diabetic neuropathy affecting autonomic nerves. - Included in blood tests to rule out other causes.  Chronic nasal rash Intermittent nasal rash with occasional itching and soreness. Persistent and recurrent. - Apply Vaseline nightly until improvement. - Use over-the-counter hydrocortisone  cream if no improvement. - Consider referral to dermatologist if symptoms persist. - Prescribed mupirocin  ointment for two weeks if bacterial infection suspected.  History of prostate cancer Treated with radiation in 2023. Currently on tamsulosin  for urinary symptoms. - Continue tamsulosin . - Follow up with urologist and hematologist/oncologist as scheduled.           Return in about 3 months (around 11/05/2024) for DM.    Toribio MARLA Slain, MD  "

## 2024-08-08 NOTE — Patient Instructions (Signed)
" ° °  YOUR PLAN: TYPE 2 DIABETES MELLITUS WITH NEUROPATHY: Your diabetes management is complicated by neuropathy, which may be affecting your autonomic nerves and causing your symptoms. -We ordered blood tests to check your A1c, kidney and liver function, -We will follow up in three months to review your lab results and adjust your management plan.   CHRONIC NASAL RASH: You have a recurrent nasal rash that itches and becomes sore. -Apply Vaseline nightly until you see improvement. -If there is no improvement, use over-the-counter hydrocortisone  cream. -If symptoms persist, we may refer you to a dermatologist. -We prescribed mupirocin  ointment for two weeks if a bacterial infection is suspected.  HISTORY OF PROSTATE CANCER: You were treated with radiation for prostate cancer in 2023 and are currently taking tamsulosin  for urinary symptoms. -Continue taking tamsulosin . -Follow up with your urologist and hematologist/oncologist as scheduled.   "

## 2024-08-10 ENCOUNTER — Ambulatory Visit: Payer: Self-pay | Admitting: Internal Medicine

## 2024-08-14 ENCOUNTER — Other Ambulatory Visit

## 2024-08-15 ENCOUNTER — Other Ambulatory Visit

## 2024-08-21 ENCOUNTER — Ambulatory Visit

## 2024-08-22 ENCOUNTER — Telehealth: Admitting: Urology

## 2024-09-10 ENCOUNTER — Ambulatory Visit: Admitting: Hematology and Oncology

## 2024-09-10 ENCOUNTER — Other Ambulatory Visit

## 2024-09-10 ENCOUNTER — Encounter: Admitting: Family Medicine

## 2024-11-08 ENCOUNTER — Ambulatory Visit: Admitting: Family Medicine
